# Patient Record
Sex: Male | Born: 1945 | Race: White | Hispanic: No | Marital: Married | Smoking: Former smoker
Health system: Southern US, Community
[De-identification: ages and names within clinical notes are randomized; demographics above are authoritative.]

## PROBLEM LIST (undated history)

## (undated) DIAGNOSIS — M1990 Unspecified osteoarthritis, unspecified site: Secondary | ICD-10-CM

## (undated) DIAGNOSIS — G4730 Sleep apnea, unspecified: Secondary | ICD-10-CM

## (undated) DIAGNOSIS — Z87442 Personal history of urinary calculi: Secondary | ICD-10-CM

## (undated) DIAGNOSIS — G8929 Other chronic pain: Secondary | ICD-10-CM

## (undated) DIAGNOSIS — N138 Other obstructive and reflux uropathy: Secondary | ICD-10-CM

## (undated) DIAGNOSIS — K5790 Diverticulosis of intestine, part unspecified, without perforation or abscess without bleeding: Secondary | ICD-10-CM

## (undated) DIAGNOSIS — K5792 Diverticulitis of intestine, part unspecified, without perforation or abscess without bleeding: Secondary | ICD-10-CM

## (undated) DIAGNOSIS — R9431 Abnormal electrocardiogram [ECG] [EKG]: Secondary | ICD-10-CM

## (undated) DIAGNOSIS — M109 Gout, unspecified: Secondary | ICD-10-CM

## (undated) DIAGNOSIS — I495 Sick sinus syndrome: Secondary | ICD-10-CM

## (undated) DIAGNOSIS — I10 Essential (primary) hypertension: Secondary | ICD-10-CM

## (undated) DIAGNOSIS — C801 Malignant (primary) neoplasm, unspecified: Secondary | ICD-10-CM

## (undated) DIAGNOSIS — M545 Low back pain, unspecified: Secondary | ICD-10-CM

## (undated) DIAGNOSIS — N401 Enlarged prostate with lower urinary tract symptoms: Secondary | ICD-10-CM

## (undated) DIAGNOSIS — G629 Polyneuropathy, unspecified: Secondary | ICD-10-CM

## (undated) DIAGNOSIS — R0600 Dyspnea, unspecified: Secondary | ICD-10-CM

## (undated) DIAGNOSIS — K219 Gastro-esophageal reflux disease without esophagitis: Secondary | ICD-10-CM

## (undated) DIAGNOSIS — I2510 Atherosclerotic heart disease of native coronary artery without angina pectoris: Secondary | ICD-10-CM

## (undated) DIAGNOSIS — I480 Paroxysmal atrial fibrillation: Secondary | ICD-10-CM

## (undated) DIAGNOSIS — I739 Peripheral vascular disease, unspecified: Secondary | ICD-10-CM

## (undated) DIAGNOSIS — N183 Chronic kidney disease, stage 3 unspecified: Secondary | ICD-10-CM

## (undated) DIAGNOSIS — N200 Calculus of kidney: Secondary | ICD-10-CM

## (undated) DIAGNOSIS — F410 Panic disorder [episodic paroxysmal anxiety] without agoraphobia: Secondary | ICD-10-CM

## (undated) DIAGNOSIS — R0789 Other chest pain: Secondary | ICD-10-CM

## (undated) DIAGNOSIS — I119 Hypertensive heart disease without heart failure: Secondary | ICD-10-CM

## (undated) DIAGNOSIS — K641 Second degree hemorrhoids: Secondary | ICD-10-CM

## (undated) DIAGNOSIS — Z950 Presence of cardiac pacemaker: Secondary | ICD-10-CM

## (undated) DIAGNOSIS — I4819 Other persistent atrial fibrillation: Secondary | ICD-10-CM

## (undated) DIAGNOSIS — E785 Hyperlipidemia, unspecified: Secondary | ICD-10-CM

## (undated) DIAGNOSIS — F419 Anxiety disorder, unspecified: Secondary | ICD-10-CM

## (undated) DIAGNOSIS — K648 Other hemorrhoids: Secondary | ICD-10-CM

## (undated) DIAGNOSIS — I4892 Unspecified atrial flutter: Secondary | ICD-10-CM

## (undated) DIAGNOSIS — E039 Hypothyroidism, unspecified: Secondary | ICD-10-CM

## (undated) DIAGNOSIS — I779 Disorder of arteries and arterioles, unspecified: Secondary | ICD-10-CM

## (undated) DIAGNOSIS — J181 Lobar pneumonia, unspecified organism: Secondary | ICD-10-CM

## (undated) HISTORY — DX: Paroxysmal atrial fibrillation: I48.0

## (undated) HISTORY — PX: CARDIAC CATHETERIZATION: SHX172

## (undated) HISTORY — DX: Hypothyroidism, unspecified: E03.9

## (undated) HISTORY — DX: Polyneuropathy, unspecified: G62.9

## (undated) HISTORY — PX: HEMORRHOID BANDING: SHX5850

## (undated) HISTORY — DX: Hypertensive heart disease without heart failure: I11.9

## (undated) HISTORY — DX: Disorder of arteries and arterioles, unspecified: I77.9

## (undated) HISTORY — DX: Second degree hemorrhoids: K64.1

## (undated) HISTORY — DX: Essential (primary) hypertension: I10

## (undated) HISTORY — DX: Benign prostatic hyperplasia with lower urinary tract symptoms: N40.1

## (undated) HISTORY — DX: Abnormal electrocardiogram (ECG) (EKG): R94.31

## (undated) HISTORY — PX: BACK SURGERY: SHX140

## (undated) HISTORY — DX: Lobar pneumonia, unspecified organism: J18.1

## (undated) HISTORY — DX: Atherosclerotic heart disease of native coronary artery without angina pectoris: I25.10

## (undated) HISTORY — PX: FRACTURE SURGERY: SHX138

## (undated) HISTORY — DX: Calculus of kidney: N20.0

## (undated) HISTORY — DX: Other chronic pain: G89.29

## (undated) HISTORY — DX: Other hemorrhoids: K64.8

## (undated) HISTORY — PX: INSERT / REPLACE / REMOVE PACEMAKER: SUR710

## (undated) HISTORY — DX: Low back pain: M54.5

## (undated) HISTORY — DX: Hyperlipidemia, unspecified: E78.5

## (undated) HISTORY — PX: OTHER SURGICAL HISTORY: SHX169

## (undated) HISTORY — DX: Diverticulosis of intestine, part unspecified, without perforation or abscess without bleeding: K57.90

## (undated) HISTORY — DX: Other persistent atrial fibrillation: I48.19

## (undated) HISTORY — DX: Low back pain, unspecified: M54.50

## (undated) HISTORY — DX: Other chest pain: R07.89

## (undated) HISTORY — DX: Peripheral vascular disease, unspecified: I73.9

## (undated) HISTORY — DX: Other obstructive and reflux uropathy: N13.8

---

## 1982-02-26 HISTORY — PX: CLOSED REDUCTION HAND FRACTURE: SHX973

## 1982-02-26 HISTORY — PX: LACERATION REPAIR: SHX5168

## 1996-02-27 HISTORY — PX: POSTERIOR LUMBAR FUSION: SHX6036

## 1997-05-27 ENCOUNTER — Encounter: Admission: RE | Admit: 1997-05-27 | Discharge: 1997-08-25 | Payer: Self-pay | Admitting: Neurological Surgery

## 1999-05-31 ENCOUNTER — Encounter: Payer: Self-pay | Admitting: Neurological Surgery

## 1999-05-31 ENCOUNTER — Ambulatory Visit (HOSPITAL_COMMUNITY): Admission: RE | Admit: 1999-05-31 | Discharge: 1999-05-31 | Payer: Self-pay | Admitting: Neurological Surgery

## 1999-06-12 ENCOUNTER — Ambulatory Visit (HOSPITAL_COMMUNITY): Admission: RE | Admit: 1999-06-12 | Discharge: 1999-06-12 | Payer: Self-pay | Admitting: Neurological Surgery

## 1999-06-12 ENCOUNTER — Encounter: Payer: Self-pay | Admitting: Neurological Surgery

## 1999-06-15 ENCOUNTER — Other Ambulatory Visit: Admission: RE | Admit: 1999-06-15 | Discharge: 1999-06-15 | Payer: Self-pay | Admitting: Internal Medicine

## 1999-06-15 ENCOUNTER — Encounter (INDEPENDENT_AMBULATORY_CARE_PROVIDER_SITE_OTHER): Payer: Self-pay | Admitting: Specialist

## 2000-03-05 ENCOUNTER — Encounter: Admission: RE | Admit: 2000-03-05 | Discharge: 2000-03-05 | Payer: Self-pay | Admitting: Orthopedic Surgery

## 2000-03-05 ENCOUNTER — Encounter: Payer: Self-pay | Admitting: Orthopedic Surgery

## 2001-12-20 ENCOUNTER — Emergency Department (HOSPITAL_COMMUNITY): Admission: EM | Admit: 2001-12-20 | Discharge: 2001-12-20 | Payer: Self-pay | Admitting: Emergency Medicine

## 2001-12-20 ENCOUNTER — Encounter: Payer: Self-pay | Admitting: Emergency Medicine

## 2002-01-06 ENCOUNTER — Encounter: Payer: Self-pay | Admitting: Urology

## 2002-01-06 ENCOUNTER — Encounter: Admission: RE | Admit: 2002-01-06 | Discharge: 2002-01-06 | Payer: Self-pay | Admitting: Urology

## 2002-08-12 ENCOUNTER — Ambulatory Visit (HOSPITAL_COMMUNITY): Admission: RE | Admit: 2002-08-12 | Discharge: 2002-08-12 | Payer: Self-pay | Admitting: Cardiovascular Disease

## 2003-06-28 ENCOUNTER — Ambulatory Visit (HOSPITAL_COMMUNITY): Admission: RE | Admit: 2003-06-28 | Discharge: 2003-06-28 | Payer: Self-pay | Admitting: Internal Medicine

## 2004-05-14 ENCOUNTER — Ambulatory Visit (HOSPITAL_COMMUNITY): Admission: RE | Admit: 2004-05-14 | Discharge: 2004-05-14 | Payer: Self-pay | Admitting: Neurological Surgery

## 2005-04-02 ENCOUNTER — Ambulatory Visit: Payer: Self-pay | Admitting: Internal Medicine

## 2005-04-09 ENCOUNTER — Ambulatory Visit: Payer: Self-pay | Admitting: Internal Medicine

## 2005-04-11 ENCOUNTER — Ambulatory Visit: Payer: Self-pay | Admitting: Internal Medicine

## 2005-04-24 ENCOUNTER — Ambulatory Visit: Payer: Self-pay | Admitting: Internal Medicine

## 2005-04-24 ENCOUNTER — Encounter: Payer: Self-pay | Admitting: Internal Medicine

## 2005-07-10 ENCOUNTER — Ambulatory Visit: Payer: Self-pay | Admitting: Internal Medicine

## 2005-07-17 ENCOUNTER — Ambulatory Visit: Payer: Self-pay | Admitting: Internal Medicine

## 2005-12-10 ENCOUNTER — Ambulatory Visit: Payer: Self-pay | Admitting: Internal Medicine

## 2005-12-10 LAB — CONVERTED CEMR LAB
ALT: 47 units/L — ABNORMAL HIGH (ref 0–40)
AST: 25 units/L (ref 0–37)
Albumin: 4.2 g/dL (ref 3.5–5.2)
Alkaline Phosphatase: 43 units/L (ref 39–117)
BUN: 17 mg/dL (ref 6–23)
Bilirubin, Direct: 0.2 mg/dL (ref 0.0–0.3)
CO2: 29 meq/L (ref 19–32)
Calcium: 9.3 mg/dL (ref 8.4–10.5)
Chloride: 107 meq/L (ref 96–112)
Chol/HDL Ratio, serum: 5.6
Cholesterol: 167 mg/dL (ref 0–200)
Creatinine, Ser: 1.5 mg/dL (ref 0.4–1.5)
GFR calc non Af Amer: 51 mL/min
Glomerular Filtration Rate, Af Am: 62 mL/min/{1.73_m2}
Glucose, Bld: 102 mg/dL — ABNORMAL HIGH (ref 70–99)
HDL: 29.8 mg/dL — ABNORMAL LOW (ref >39.0)
LDL DIRECT: 104.9 mg/dL
Potassium: 4.2 meq/L (ref 3.5–5.1)
Sodium: 143 meq/L (ref 135–145)
Total Bilirubin: 0.9 mg/dL (ref 0.3–1.2)
Total Protein: 6.5 g/dL (ref 6.0–8.3)
Triglyceride fasting, serum: 203 mg/dL (ref 0–149)
VLDL: 41 mg/dL — ABNORMAL HIGH (ref 0–40)

## 2005-12-17 ENCOUNTER — Ambulatory Visit: Payer: Self-pay | Admitting: Internal Medicine

## 2005-12-17 DIAGNOSIS — E785 Hyperlipidemia, unspecified: Secondary | ICD-10-CM

## 2005-12-17 DIAGNOSIS — I10 Essential (primary) hypertension: Secondary | ICD-10-CM | POA: Insufficient documentation

## 2006-03-05 ENCOUNTER — Ambulatory Visit (HOSPITAL_COMMUNITY): Admission: RE | Admit: 2006-03-05 | Discharge: 2006-03-05 | Payer: Self-pay | Admitting: Neurological Surgery

## 2006-04-17 ENCOUNTER — Encounter: Admission: RE | Admit: 2006-04-17 | Discharge: 2006-04-17 | Payer: Self-pay | Admitting: Neurological Surgery

## 2006-06-03 ENCOUNTER — Ambulatory Visit: Payer: Self-pay | Admitting: Internal Medicine

## 2006-06-03 LAB — CONVERTED CEMR LAB
ALT: 33 units/L (ref 0–40)
AST: 22 units/L (ref 0–37)
Albumin: 3.9 g/dL (ref 3.5–5.2)
Alkaline Phosphatase: 42 units/L (ref 39–117)
BUN: 18 mg/dL (ref 6–23)
Basophils Absolute: 0 10*3/uL (ref 0.0–0.1)
Basophils Relative: 0.7 % (ref 0.0–1.0)
Bilirubin, Direct: 0.1 mg/dL (ref 0.0–0.3)
CO2: 31 meq/L (ref 19–32)
Calcium: 9.5 mg/dL (ref 8.4–10.5)
Chloride: 107 meq/L (ref 96–112)
Cholesterol: 197 mg/dL (ref 0–200)
Creatinine, Ser: 1.1 mg/dL (ref 0.4–1.5)
Eosinophils Absolute: 0.2 10*3/uL (ref 0.0–0.6)
Eosinophils Relative: 4.3 % (ref 0.0–5.0)
GFR calc Af Amer: 88 mL/min
GFR calc non Af Amer: 73 mL/min
Glucose, Bld: 113 mg/dL — ABNORMAL HIGH (ref 70–99)
HCT: 45.6 % (ref 39.0–52.0)
HDL: 41.4 mg/dL (ref >39.0)
Hemoglobin: 15.5 g/dL (ref 13.0–17.0)
Hgb A1c MFr Bld: 5.1 % (ref 4.6–6.0)
LDL Cholesterol: 120 mg/dL — ABNORMAL HIGH (ref 0–99)
Lymphocytes Relative: 35.2 % (ref 12.0–46.0)
MCHC: 34 g/dL (ref 30.0–36.0)
MCV: 85.4 fL (ref 78.0–100.0)
Monocytes Absolute: 0.5 10*3/uL (ref 0.2–0.7)
Monocytes Relative: 10.2 % (ref 3.0–11.0)
Neutro Abs: 2.3 10*3/uL (ref 1.4–7.7)
Neutrophils Relative %: 49.6 % (ref 43.0–77.0)
PSA: 0.86 ng/mL (ref 0.10–4.00)
Platelets: 199 10*3/uL (ref 150–400)
Potassium: 4.5 meq/L (ref 3.5–5.1)
RBC: 5.34 M/uL (ref 4.22–5.81)
RDW: 11.8 % (ref 11.5–14.6)
Sodium: 143 meq/L (ref 135–145)
TSH: 4.44 microintl units/mL (ref 0.35–5.50)
Total Bilirubin: 0.9 mg/dL (ref 0.3–1.2)
Total CHOL/HDL Ratio: 4.8
Total Protein: 6.6 g/dL (ref 6.0–8.3)
Triglycerides: 179 mg/dL — ABNORMAL HIGH (ref 0–149)
VLDL: 36 mg/dL (ref 0–40)
WBC: 4.6 10*3/uL (ref 4.5–10.5)

## 2006-06-10 ENCOUNTER — Ambulatory Visit: Payer: Self-pay | Admitting: Internal Medicine

## 2006-12-03 ENCOUNTER — Ambulatory Visit: Payer: Self-pay | Admitting: Internal Medicine

## 2006-12-03 LAB — CONVERTED CEMR LAB
ALT: 26 units/L (ref 0–53)
AST: 23 units/L (ref 0–37)
Albumin: 4.1 g/dL (ref 3.5–5.2)
Alkaline Phosphatase: 45 units/L (ref 39–117)
BUN: 17 mg/dL (ref 6–23)
Bilirubin, Direct: 0.2 mg/dL (ref 0.0–0.3)
CO2: 33 meq/L — ABNORMAL HIGH (ref 19–32)
Calcium: 9.6 mg/dL (ref 8.4–10.5)
Chloride: 103 meq/L (ref 96–112)
Cholesterol: 197 mg/dL (ref 0–200)
Creatinine, Ser: 1.2 mg/dL (ref 0.4–1.5)
GFR calc Af Amer: 80 mL/min
GFR calc non Af Amer: 66 mL/min
Glucose, Bld: 92 mg/dL (ref 70–99)
HDL: 34.2 mg/dL — ABNORMAL LOW (ref >39.0)
LDL Cholesterol: 129 mg/dL — ABNORMAL HIGH (ref 0–99)
Potassium: 4.2 meq/L (ref 3.5–5.1)
Sodium: 142 meq/L (ref 135–145)
Total Bilirubin: 1.1 mg/dL (ref 0.3–1.2)
Total CHOL/HDL Ratio: 5.8
Total Protein: 6.6 g/dL (ref 6.0–8.3)
Triglycerides: 170 mg/dL — ABNORMAL HIGH (ref 0–149)
VLDL: 34 mg/dL (ref 0–40)

## 2006-12-10 ENCOUNTER — Ambulatory Visit: Payer: Self-pay | Admitting: Internal Medicine

## 2007-05-28 ENCOUNTER — Ambulatory Visit: Payer: Self-pay | Admitting: Internal Medicine

## 2007-05-28 LAB — CONVERTED CEMR LAB
Albumin: 3.8 g/dL (ref 3.5–5.2)
Basophils Absolute: 0 10*3/uL (ref 0.0–0.1)
CO2: 30 meq/L (ref 19–32)
Calcium: 9.3 mg/dL (ref 8.4–10.5)
Chloride: 105 meq/L (ref 96–112)
Eosinophils Absolute: 0.3 10*3/uL (ref 0.0–0.7)
HDL: 34.5 mg/dL — ABNORMAL LOW (ref >39.0)
Hemoglobin: 14.6 g/dL (ref 13.0–17.0)
LDL Cholesterol: 108 mg/dL — ABNORMAL HIGH (ref 0–99)
MCHC: 34.7 g/dL (ref 30.0–36.0)
Monocytes Absolute: 0.8 10*3/uL (ref 0.1–1.0)
Neutrophils Relative %: 56.4 % (ref 43.0–77.0)
Nitrite: NEGATIVE
PSA: 1.04 ng/mL (ref 0.10–4.00)
Sodium: 142 meq/L (ref 135–145)
Total Bilirubin: 1.1 mg/dL (ref 0.3–1.2)
Total Protein: 6.5 g/dL (ref 6.0–8.3)
Triglycerides: 168 mg/dL — ABNORMAL HIGH (ref 0–149)
Urobilinogen, UA: 0.2

## 2007-06-05 ENCOUNTER — Encounter: Payer: Self-pay | Admitting: Internal Medicine

## 2007-06-05 ENCOUNTER — Ambulatory Visit: Payer: Self-pay | Admitting: Internal Medicine

## 2007-12-03 ENCOUNTER — Ambulatory Visit: Payer: Self-pay | Admitting: Internal Medicine

## 2007-12-03 LAB — CONVERTED CEMR LAB
AST: 24 units/L (ref 0–37)
BUN: 21 mg/dL (ref 6–23)
Calcium: 9.2 mg/dL (ref 8.4–10.5)
Cholesterol: 184 mg/dL (ref 0–200)
Creatinine, Ser: 1.1 mg/dL (ref 0.4–1.5)
LDL Cholesterol: 111 mg/dL — ABNORMAL HIGH (ref 0–99)
Sodium: 148 meq/L — ABNORMAL HIGH (ref 135–145)
Total Bilirubin: 0.9 mg/dL (ref 0.3–1.2)
Total CHOL/HDL Ratio: 5
Triglycerides: 182 mg/dL — ABNORMAL HIGH (ref 0–149)

## 2007-12-22 ENCOUNTER — Ambulatory Visit: Payer: Self-pay | Admitting: Internal Medicine

## 2008-01-16 ENCOUNTER — Encounter: Payer: Self-pay | Admitting: Internal Medicine

## 2008-06-07 ENCOUNTER — Ambulatory Visit: Payer: Self-pay | Admitting: Internal Medicine

## 2008-06-07 LAB — CONVERTED CEMR LAB
ALT: 34 units/L (ref 0–53)
AST: 27 units/L (ref 0–37)
Calcium: 8.9 mg/dL (ref 8.4–10.5)
Glucose, Urine, Semiquant: NEGATIVE
HCT: 42.5 % (ref 39.0–52.0)
HDL: 35.9 mg/dL — ABNORMAL LOW (ref >39.00)
Hemoglobin: 14.8 g/dL (ref 13.0–17.0)
Lymphs Abs: 1.8 10*3/uL (ref 0.7–4.0)
MCV: 87.3 fL (ref 78.0–100.0)
Monocytes Absolute: 0.6 10*3/uL (ref 0.1–1.0)
Neutro Abs: 2.2 10*3/uL (ref 1.4–7.7)
Nitrite: NEGATIVE
PSA: 1.28 ng/mL (ref 0.10–4.00)
Platelets: 178 10*3/uL (ref 150.0–400.0)
RDW: 12.2 % (ref 11.5–14.6)
Sodium: 146 meq/L — ABNORMAL HIGH (ref 135–145)
TSH: 4.04 microintl units/mL (ref 0.35–5.50)
Total Bilirubin: 0.8 mg/dL (ref 0.3–1.2)
Triglycerides: 157 mg/dL — ABNORMAL HIGH (ref 0.0–149.0)
VLDL: 31.4 mg/dL (ref 0.0–40.0)
pH: 5.5

## 2008-06-14 ENCOUNTER — Ambulatory Visit: Payer: Self-pay | Admitting: Internal Medicine

## 2008-09-20 ENCOUNTER — Ambulatory Visit: Payer: Self-pay | Admitting: Internal Medicine

## 2008-09-20 DIAGNOSIS — E669 Obesity, unspecified: Secondary | ICD-10-CM

## 2009-06-09 ENCOUNTER — Ambulatory Visit: Payer: Self-pay | Admitting: Internal Medicine

## 2009-06-09 LAB — CONVERTED CEMR LAB
ALT: 32 units/L (ref 0–53)
Albumin: 4.3 g/dL (ref 3.5–5.2)
Basophils Absolute: 0 10*3/uL (ref 0.0–0.1)
Bilirubin, Direct: 0 mg/dL (ref 0.0–0.3)
CO2: 30 meq/L (ref 19–32)
Calcium: 9.4 mg/dL (ref 8.4–10.5)
Cholesterol: 168 mg/dL (ref 0–200)
Creatinine, Ser: 1.4 mg/dL (ref 0.4–1.5)
Direct LDL: 104.9 mg/dL
Eosinophils Relative: 3.9 % (ref 0.0–5.0)
Glucose, Bld: 90 mg/dL (ref 70–99)
HDL: 41.4 mg/dL (ref >39.00)
Hemoglobin: 15.1 g/dL (ref 13.0–17.0)
Lymphocytes Relative: 32.1 % (ref 12.0–46.0)
Monocytes Absolute: 0.4 10*3/uL (ref 0.1–1.0)
Monocytes Relative: 8.9 % (ref 3.0–12.0)
Neutro Abs: 2.7 10*3/uL (ref 1.4–7.7)
Potassium: 4.1 meq/L (ref 3.5–5.1)
RDW: 13.1 % (ref 11.5–14.6)
Total Bilirubin: 0.6 mg/dL (ref 0.3–1.2)
Total CHOL/HDL Ratio: 4
Total Protein: 7.1 g/dL (ref 6.0–8.3)
WBC Urine, dipstick: NEGATIVE

## 2009-06-15 ENCOUNTER — Ambulatory Visit: Payer: Self-pay | Admitting: Internal Medicine

## 2010-03-28 NOTE — Assessment & Plan Note (Signed)
Summary: cpx/mhf/pt rsc/cjr   Vital Signs:  Patient profile:   65 year old male Height:      69.5 inches Weight:      233 pounds BMI:     34.04 Pulse rate:   68 / minute Pulse rhythm:   regular Resp:     12 per minute BP sitting:   122 / 68  (left arm) Cuff size:   regular  Vitals Entered By: Rica Records, RN (June 15, 2009 8:03 AM)  Nutrition Counseling: Patient's BMI is greater than 25 and therefore counseled on weight management options. CC: CPX, LABS DONE Is Patient Diabetic? No   CC:  CPX and LABS DONE.  History of Present Illness: cpx  Preventive Screening-Counseling & Management  Alcohol-Tobacco     Smoking Status: quit     Year Quit: 1996  Current Problems (verified): 1)  Obesity  (ICD-278.00) 2)  Preventive Health Care  (ICD-V70.0) 3)  Hypertension  (ICD-401.9) 4)  Hyperlipidemia  (ICD-272.4)  Current Medications (verified): 1)  Hyzaar 100-25 Mg Tabs (Losartan Potassium-Hctz) .... Take 1 Tab By Mouth Daily 2)  Zocor 20 Mg Tabs (Simvastatin) .... Take 1 Tablet By Mouth Once A Day 3)  Aspirin 81 Mg  Tbec (Aspirin) .... One By Mouth Every Day 4)  Ibuprofen 200 Mg Tabs (Ibuprofen) .... 3 Three Times A Day  Allergies (verified): No Known Drug Allergies  Past History:  Past Medical History: Last updated: 12/10/2006 Blood transfusion-back surgery Hyperlipidemia Hypertension  Past Surgical History: Last updated: 10/22/2006 back surgery, 2 discs  1998 hand surgery  Family History: Last updated: 06/05/2007 mother-alive with memory-dementia-deceased age 36 yo Family History of Stroke M 1st degree relative father 4  Social History: Last updated: 06/05/2007 Retired Former Smoker Regular exercise-no Married  Risk Factors: Exercise: no (06/10/2006)  Risk Factors: Smoking Status: quit (06/15/2009)  Review of Systems       All other systems reviewed and were negative    Impression & Recommendations:  Problem # 1:  PREVENTIVE HEALTH CARE  (ICD-V70.0) health maint UTD advised daily exercise  Problem # 2:  HYPERTENSION (ICD-401.9) controlled continue current medications  His updated medication list for this problem includes:    Hyzaar 100-25 Mg Tabs (Losartan potassium-hctz) .[name]... Take 1 tab by mouth daily  BP today: 122/68 Prior BP: 142/60 (09/20/2008)  Labs Reviewed: K+: 4.1 (06/09/2009) Creat: : 1.4 (06/09/2009)   Chol: 168 (06/09/2009)   HDL: 41.40 (06/09/2009)   LDL: 115 (06/07/2008)   TG: 222.0 (06/09/2009)  Problem # 3:  HYPERLIPIDEMIA (ICD-272.4)  His updated medication list for this problem includes:    Zocor 20 Mg Tabs (Simvastatin) .[name]... Take 1 tablet by mouth once a day  Labs Reviewed: SGOT: 22 (06/09/2009)   SGPT: 32 (06/09/2009)   HDL:41.40 (06/09/2009), 35.90 (06/07/2008)  LDL:115 (06/07/2008), 111 (12/03/2007)  Chol:168 (06/09/2009), 182 (06/07/2008)  Trig:222.0 (06/09/2009), 157.0 (06/07/2008)  Complete Medication List: 1)  Hyzaar 100-25 Mg Tabs (Losartan potassium-hctz) .... Take 1 tab by mouth daily 2)  Zocor 20 Mg Tabs (Simvastatin) .... Take 1 tablet by mouth once a day 3)  Aspirin 81 Mg Tbec (Aspirin) .... One by mouth every day 4)  Ibuprofen 200 Mg Tabs (Ibuprofen) .... 3 three times a day  Prescriptions: ZOCOR 20 MG TABS (SIMVASTATIN) Take 1 tablet by mouth once a day  #90 x 3   Entered and Authorized by:   Phoebe Sharps MD   Signed by:   Phoebe Sharps MD on 06/15/2009   Method used:  Electronically to        [facility] (retail)       [place], [place]  [address]       Ph: [phone]       Fax: [phone]   RxID:   [id number] HYZAAR 100-25 MG TABS (LOSARTAN POTASSIUM-HCTZ) Take 1 tab by mouth daily  #90 x 3   Entered and Authorized by:   Phoebe Sharps MD   Signed by:   Phoebe Sharps MD on 06/15/2009   Method used:   Electronically to        [facility] (retail)       [address]       [place], [place]  [address]       Ph: [phone]       Fax: [phone]   RxID:   [id number]  Physical Exam General Appearance: well developed, well nourished, no acute distress Eyes: conjunctiva and lids normal, PERRL, EOMI,fundi WNL Ears, Nose, Mouth, Throat: TM clear, nares clear, oral exam WNL Neck: supple, no lymphadenopathy, no thyromegaly, no JVD Respiratory: clear to auscultation and percussion, respiratory effort normal Cardiovascular: regular rate and rhythm, S1-S2, no murmur, rub or gallop, no bruits, peripheral pulses normal and symmetric, no cyanosis, clubbing, edema or varicosities Chest: no scars, masses, tenderness; no asymmetry, skin changes, nipple discharge, no gynecomastia   Gastrointestinal: soft, non-tender; no hepatosplenomegaly, masses; active bowel sounds all quadrants, guaiac negative stool; no masses, tenderness, hemorrhoids  Genitourinary: no hernia, or prostate enlargement Lymphatic: no cervical, axillary or inguinal adenopathy Musculoskeletal: gait normal, muscle tone and strength WNL, no joint swelling, effusions, discoloration, crepitus  Skin: clear, good turgor, color WNL, no rashes, lesions, or ulcerations Neurologic: normal mental status, normal reflexes, normal strength, sensation, and motion Psychiatric: alert; oriented to person, place and time Other Exam:

## 2010-07-14 NOTE — Cardiovascular Report (Signed)
NAME:  Erik [name], Erik [name]                         ACCOUNT NO.:  [id number]   MEDICAL RECORD NO.:  [id number]                   PATIENT TYPE:  OIB   LOCATION:  2852                                 FACILITY:  [facility]   PHYSICIAN:  Jenkins Rouge, M.D.                  DATE OF BIRTH:  03-Mar-1945   DATE OF PROCEDURE:  DATE OF DISCHARGE:                              CARDIAC CATHETERIZATION   PROCEDURE:  Coronary angiography.   INDICATIONS:  Abnormal Cardiolite study.  The patient with multiple coronary  risk factors.   PROCEDURE:  Standard catheterization was done from the right femoral artery   ANGIOGRAPHY:  The left main coronary artery had a 20% discreet lesion.   The left anterior descending artery had 20% multiple discreet lesions in the  mid and distal portion.  First diagonal branch was normal.   Circumflex coronary artery was nondominant.  There was a 50-60% tubular  lesion in the mid vessel.  The first obtuse marginal branch was a medium to  large sized vessel with a 40% eccentric lesion in the mid vessel.   The right coronary artery was dominant.  There were 30% multiple discreet  lesions in the mid portion of the vessel.  The posterolateral branch was  somewhat small and diffusely diseased.   RAO VENTRICULOGRAM:  The RAO ventriculography was normal.  EF was in the 60%  range.  There was no gradient across the aortic valve and no MR.  Aortic  pressure was in the 168/88 range.  LV pressure was in the 168/18 range.   IMPRESSION:  Films were reviewed with Loretha Brasil. Lia Foyer, M.D. and we both  agreed with medical therapy.   The patient's chest pain is non-exertional.  It is primarily with  recumbency.  I would suggest a follow-up EGD.   The patient had his blood pressure medicine and cholesterol medicine  adjusted by Minus Breeding, M.D.  I think there is a chance of regression  and stabilization of his disease with high dose Statin drugs.   Since his Cardiolite was equivocal  in the inferior wall, it may be  worthwhile in six months after we adjust his cholesterol and blood pressure  medicine to do a stress echocardiogram for reassessment.   Clearly, if the patient's pain becomes more anginal sounding or he has a  markedly positive Cardiolite study, the circumflex coronary artery could be  dilated.   He will be discharged later today.  He will have to follow up with [facility] and get his LFTs checked in six to eight weeks.                                               Jenkins Rouge, M.D.    PN/MEDQ  D:  08/12/2002  T:  08/12/2002  Job:  [id number]  Minus Breeding, M.D.   cc:   Minus Breeding, M.D.

## 2010-07-19 ENCOUNTER — Other Ambulatory Visit: Payer: Self-pay | Admitting: Internal Medicine

## 2010-08-22 ENCOUNTER — Other Ambulatory Visit: Payer: Self-pay | Admitting: Internal Medicine

## 2010-10-23 ENCOUNTER — Other Ambulatory Visit: Payer: Self-pay | Admitting: Internal Medicine

## 2010-10-26 ENCOUNTER — Telehealth: Payer: Self-pay | Admitting: Family Medicine

## 2010-10-26 NOTE — Telephone Encounter (Signed)
Pt. wants to switch from Dr. Leanne Chang to Dr. Sarajane Jews due to schedule availability

## 2010-10-26 NOTE — Telephone Encounter (Signed)
ok 

## 2010-10-27 ENCOUNTER — Ambulatory Visit (INDEPENDENT_AMBULATORY_CARE_PROVIDER_SITE_OTHER): Payer: 59 | Admitting: Family Medicine

## 2010-10-27 ENCOUNTER — Encounter: Payer: Self-pay | Admitting: Family Medicine

## 2010-10-27 VITALS — BP 120/88 | HR 64 | Temp 98.3°F | Wt 228.0 lb

## 2010-10-27 DIAGNOSIS — E785 Hyperlipidemia, unspecified: Secondary | ICD-10-CM

## 2010-10-27 DIAGNOSIS — M109 Gout, unspecified: Secondary | ICD-10-CM

## 2010-10-27 DIAGNOSIS — I10 Essential (primary) hypertension: Secondary | ICD-10-CM

## 2010-10-27 MED ORDER — LOSARTAN POTASSIUM-HCTZ 100-25 MG PO TABS: 1.0000 | ORAL_TABLET | Freq: Every day | ORAL | Status: DC

## 2010-10-27 MED ORDER — SIMVASTATIN 20 MG PO TABS: 20.0000 mg | ORAL_TABLET | Freq: Every day | ORAL | Status: DC

## 2010-10-27 NOTE — Telephone Encounter (Signed)
Okay with me 

## 2010-10-27 NOTE — Progress Notes (Signed)
  Subjective:    Patient ID: Erik [name], male    DOB: 1945/05/29, 65 y.o.   MRN: [id number]  HPI Here for several issues. He is switching from Dr. Leanne Chang to me. He has run out of his meds and needs refills. He has not had any labs drawn since his last cpx in April 2011. His BP at home is stable. He played golf for the first time in years about 4 weeks ago, and he immediately had pain and swelling in both feet. This got better but then 2 weeks ago he woke up one morning with severe pain in both great toes, right worse then left. They were red, hot, and extremely tender. They have slowly improved since then. He takes 600 mg of Ibuprofen tid and has done so for years.    Review of Systems  Constitutional: Negative.   Respiratory: Negative.   Cardiovascular: Negative.   Musculoskeletal: Positive for back pain and joint swelling.       Objective:   Physical Exam  Constitutional: He appears well-developed and well-nourished.  Cardiovascular: Normal rate, regular rhythm, normal heart sounds and intact distal pulses.   Pulmonary/Chest: Effort normal and breath sounds normal.  Musculoskeletal:       The right great toe is tender at the IP joint. Not red or swollen           Assessment & Plan:  His HTN is stable, and I refilled the meds. He needs to set up fasting labs soon to check his lipids. He has had his first gout attack and it seems to be almost over with now. Check a uric acid level.

## 2010-11-01 ENCOUNTER — Ambulatory Visit: Payer: Self-pay | Admitting: Family Medicine

## 2010-11-02 ENCOUNTER — Other Ambulatory Visit (INDEPENDENT_AMBULATORY_CARE_PROVIDER_SITE_OTHER): Payer: 59

## 2010-11-02 DIAGNOSIS — Z0000 Encounter for general adult medical examination without abnormal findings: Secondary | ICD-10-CM

## 2010-11-02 LAB — POCT URINALYSIS DIPSTICK
Blood, UA: NEGATIVE
Glucose, UA: NEGATIVE
Ketones, UA: NEGATIVE
Nitrite, UA: NEGATIVE
pH, UA: 6

## 2010-11-02 LAB — VITAMIN B12: Vitamin B-12: 361 pg/mL (ref 211–911)

## 2010-11-02 LAB — BASIC METABOLIC PANEL
BUN: 21 mg/dL (ref 6–23)
CO2: 29 mEq/L (ref 19–32)
Calcium: 9.2 mg/dL (ref 8.4–10.5)
Creatinine, Ser: 1.2 mg/dL (ref 0.4–1.5)
GFR: 64.24 mL/min (ref >60.00)
Glucose, Bld: 99 mg/dL (ref 70–99)

## 2010-11-02 LAB — CBC WITH DIFFERENTIAL/PLATELET
Basophils Absolute: 0 10*3/uL (ref 0.0–0.1)
Eosinophils Absolute: 0.2 10*3/uL (ref 0.0–0.7)
Eosinophils Relative: 4 % (ref 0.0–5.0)
HCT: 43.1 % (ref 39.0–52.0)
Hemoglobin: 14.7 g/dL (ref 13.0–17.0)
Lymphocytes Relative: 28.5 % (ref 12.0–46.0)
MCHC: 34.1 g/dL (ref 30.0–36.0)
MCV: 87.9 fl (ref 78.0–100.0)
RBC: 4.91 Mil/uL (ref 4.22–5.81)

## 2010-11-02 LAB — HEPATIC FUNCTION PANEL
Albumin: 4.3 g/dL (ref 3.5–5.2)
Alkaline Phosphatase: 45 U/L (ref 39–117)
Total Protein: 7 g/dL (ref 6.0–8.3)

## 2010-11-02 LAB — LIPID PANEL
LDL Cholesterol: 108 mg/dL — ABNORMAL HIGH (ref 0–99)
VLDL: 32 mg/dL (ref 0.0–40.0)

## 2010-11-02 LAB — URIC ACID: Uric Acid, Serum: 6.7 mg/dL (ref 4.0–7.8)

## 2010-11-08 ENCOUNTER — Telehealth: Payer: Self-pay | Admitting: Family Medicine

## 2010-11-08 NOTE — Telephone Encounter (Signed)
Message copied by Rudi Coco on Wed Nov 08, 2010 12:28 PM ------      Message from: [name] A      Created: Tue Nov 07, 2010  5:24 AM       Normal except high TG and chol. Watch the diet

## 2010-11-08 NOTE — Telephone Encounter (Signed)
Spoke with pt and went over results.  

## 2011-11-05 ENCOUNTER — Other Ambulatory Visit: Payer: Self-pay | Admitting: Family Medicine

## 2011-11-05 NOTE — Telephone Encounter (Signed)
Can we refill these? 

## 2011-11-05 NOTE — Telephone Encounter (Signed)
Call in #30 of both with no rf. He needs an OV soon

## 2011-11-19 ENCOUNTER — Ambulatory Visit (INDEPENDENT_AMBULATORY_CARE_PROVIDER_SITE_OTHER): Payer: 59 | Admitting: Family Medicine

## 2011-11-19 ENCOUNTER — Encounter: Payer: Self-pay | Admitting: Family Medicine

## 2011-11-19 VITALS — BP 120/80 | HR 63 | Temp 97.9°F | Wt 229.0 lb

## 2011-11-19 DIAGNOSIS — I10 Essential (primary) hypertension: Secondary | ICD-10-CM

## 2011-11-19 DIAGNOSIS — Z0000 Encounter for general adult medical examination without abnormal findings: Secondary | ICD-10-CM

## 2011-11-19 DIAGNOSIS — E785 Hyperlipidemia, unspecified: Secondary | ICD-10-CM

## 2011-11-19 DIAGNOSIS — Z2911 Encounter for prophylactic immunotherapy for respiratory syncytial virus (RSV): Secondary | ICD-10-CM

## 2011-11-19 LAB — CBC WITH DIFFERENTIAL/PLATELET
Basophils Absolute: 0 K/uL (ref 0.0–0.1)
Basophils Relative: 0.5 % (ref 0.0–3.0)
Eosinophils Absolute: 0.3 K/uL (ref 0.0–0.7)
Eosinophils Relative: 4.9 % (ref 0.0–5.0)
HCT: 43.4 % (ref 39.0–52.0)
Hemoglobin: 14.9 g/dL (ref 13.0–17.0)
Lymphocytes Relative: 33.8 % (ref 12.0–46.0)
Lymphs Abs: 1.7 K/uL (ref 0.7–4.0)
MCHC: 34.2 g/dL (ref 30.0–36.0)
MCV: 86.7 fl (ref 78.0–100.0)
Monocytes Absolute: 0.6 K/uL (ref 0.1–1.0)
Monocytes Relative: 12.1 % — ABNORMAL HIGH (ref 3.0–12.0)
Neutro Abs: 2.5 K/uL (ref 1.4–7.7)
Neutrophils Relative %: 48.7 % (ref 43.0–77.0)
Platelets: 190 K/uL (ref 150.0–400.0)
RBC: 5.01 Mil/uL (ref 4.22–5.81)
RDW: 12.8 % (ref 11.5–14.6)
WBC: 5.1 K/uL (ref 4.5–10.5)

## 2011-11-19 LAB — POCT URINALYSIS DIPSTICK
Bilirubin, UA: NEGATIVE
Blood, UA: NEGATIVE
Glucose, UA: NEGATIVE
Ketones, UA: NEGATIVE
Leukocytes, UA: NEGATIVE
Nitrite, UA: NEGATIVE
Protein, UA: NEGATIVE
Spec Grav, UA: 1.025
Urobilinogen, UA: 0.2
pH, UA: 5.5

## 2011-11-19 MED ORDER — LOSARTAN POTASSIUM-HCTZ 100-25 MG PO TABS: 1.0000 | ORAL_TABLET | Freq: Every day | ORAL | Status: DC

## 2011-11-19 MED ORDER — SIMVASTATIN 20 MG PO TABS: 20.0000 mg | ORAL_TABLET | Freq: Every day | ORAL | Status: DC

## 2011-11-19 NOTE — Addendum Note (Signed)
Addended by: Aggie Hacker A on: 11/19/2011 09:58 AM   Modules accepted: Orders

## 2011-11-19 NOTE — Progress Notes (Signed)
  Subjective:    Patient ID: Erik [name], male    DOB: 1945/06/28, 66 y.o.   MRN: [id number]  HPI Here to follow up. He feels well and has no concerns. His BP is stable.    Review of Systems  Constitutional: Negative.   Respiratory: Negative.   Cardiovascular: Negative.        Objective:   Physical Exam  Constitutional: He appears well-developed and well-nourished.  Neck: No thyromegaly present.  Cardiovascular: Normal rate, regular rhythm, normal heart sounds and intact distal pulses.   Pulmonary/Chest: Effort normal and breath sounds normal.  Lymphadenopathy:    He has no cervical adenopathy.          Assessment & Plan:  Get fasting labs. I reminded him to set up a cpx soon.

## 2011-11-20 LAB — LIPID PANEL
Cholesterol: 171 mg/dL (ref 0–200)
HDL: 43.1 mg/dL (ref >39.00)
Triglycerides: 118 mg/dL (ref 0.0–149.0)

## 2011-11-20 LAB — BASIC METABOLIC PANEL
CO2: 28 mEq/L (ref 19–32)
Calcium: 9 mg/dL (ref 8.4–10.5)
Chloride: 103 mEq/L (ref 96–112)
GFR: 60 mL/min — ABNORMAL LOW (ref >60.00)
Potassium: 3.8 mEq/L (ref 3.5–5.1)

## 2011-11-20 LAB — HEPATIC FUNCTION PANEL
Albumin: 4.1 g/dL (ref 3.5–5.2)
Alkaline Phosphatase: 44 U/L (ref 39–117)
Total Bilirubin: 0.7 mg/dL (ref 0.3–1.2)

## 2011-11-20 LAB — TSH: TSH: 5.17 u[IU]/mL (ref 0.35–5.50)

## 2011-11-20 NOTE — Progress Notes (Signed)
Quick Note:  I left voice message with normal results. ______ 

## 2012-10-31 ENCOUNTER — Telehealth: Payer: Self-pay | Admitting: Family Medicine

## 2012-10-31 ENCOUNTER — Ambulatory Visit (INDEPENDENT_AMBULATORY_CARE_PROVIDER_SITE_OTHER): Payer: 59 | Admitting: Family Medicine

## 2012-10-31 ENCOUNTER — Encounter: Payer: Self-pay | Admitting: Family Medicine

## 2012-10-31 ENCOUNTER — Ambulatory Visit (INDEPENDENT_AMBULATORY_CARE_PROVIDER_SITE_OTHER)
Admission: RE | Admit: 2012-10-31 | Discharge: 2012-10-31 | Disposition: A | Discharge status: Home / Self Care | Payer: 59 | Source: Ambulatory Visit | Attending: Family Medicine | Admitting: Family Medicine

## 2012-10-31 VITALS — BP 140/80 | HR 75 | Temp 98.9°F | Wt 236.0 lb

## 2012-10-31 DIAGNOSIS — J189 Pneumonia, unspecified organism: Secondary | ICD-10-CM

## 2012-10-31 MED ORDER — LEVOFLOXACIN 500 MG PO TABS: 500.0000 mg | ORAL_TABLET | Freq: Every day | ORAL | Status: AC

## 2012-10-31 MED ORDER — CEFTRIAXONE SODIUM 1 G IJ SOLR
1.0000 g | Freq: Once | INTRAMUSCULAR | Status: AC
  Administered 2012-10-31: 1 g via INTRAMUSCULAR

## 2012-10-31 NOTE — Progress Notes (Signed)
  Subjective:    Patient ID: Erik [name], male    DOB: Feb 06, 1946, 67 y.o.   MRN: [id number]  HPI Here for 5 weeks of chest congestion, fatigue, and coughing up yellow sputum. No fever or chest pain. Mildly SOB. He has used Nyquil and other OTC meds for this.    Review of Systems  Constitutional: Negative.   HENT: Negative.   Eyes: Negative.   Respiratory: Positive for cough, chest tightness and shortness of breath. Negative for wheezing.   Cardiovascular: Negative.        Objective:   Physical Exam  Constitutional: He appears well-developed and well-nourished. No distress.  HENT:  Right Ear: External ear normal.  Left Ear: External ear normal.  Nose: Nose normal.  Mouth/Throat: Oropharynx is clear and moist.  Eyes: Conjunctivae are normal.  Neck: No thyromegaly present.  Pulmonary/Chest: Effort normal. No respiratory distress. He has no wheezes.  Rales are present at the right posterior base   Lymphadenopathy:    He has no cervical adenopathy.          Assessment & Plan:  RLL pneumonia. Given a shot of Rocephin as well as a course of Levaquin. He will get a CXR today

## 2012-10-31 NOTE — Telephone Encounter (Signed)
I left voice message with chest xray results.

## 2012-11-05 ENCOUNTER — Ambulatory Visit (INDEPENDENT_AMBULATORY_CARE_PROVIDER_SITE_OTHER): Payer: 59 | Admitting: Family Medicine

## 2012-11-05 ENCOUNTER — Encounter: Payer: Self-pay | Admitting: Family Medicine

## 2012-11-05 VITALS — BP 128/86 | HR 82 | Temp 98.5°F | Ht 71.5 in | Wt 235.0 lb

## 2012-11-05 DIAGNOSIS — J189 Pneumonia, unspecified organism: Secondary | ICD-10-CM

## 2012-11-05 HISTORY — DX: Pneumonia, unspecified organism: J18.9

## 2012-11-05 NOTE — Progress Notes (Signed)
  Subjective:    Patient ID: Erik [name], male    DOB: Oct 06, 1945, 67 y.o.   MRN: [id number]  HPI Here to follow up on RLL pneumonia. We saw him 5 days ago for a hard cough and RLL rales on exam. He was  Given a Rocephin shot and was started on Levaquin. A CXR that day di not show anything. He nw feels much better, has less coughing, and has more energy.    Review of Systems  Constitutional: Negative.   Respiratory: Positive for cough. Negative for chest tightness, shortness of breath and wheezing.   Cardiovascular: Negative for chest pain.       Objective:   Physical Exam  Constitutional: He appears well-developed and well-nourished. No distress.  Cardiovascular: Normal rate, regular rhythm, normal heart sounds and intact distal pulses.   Pulmonary/Chest: Effort normal and breath sounds normal. No respiratory distress. He has no wheezes. He has no rales.          Assessment & Plan:  Partially treated pneumonia. He will finish out the [place]. Recheck prn

## 2012-12-15 ENCOUNTER — Other Ambulatory Visit: Payer: Self-pay | Admitting: Family Medicine

## 2013-01-16 ENCOUNTER — Telehealth: Payer: Self-pay | Admitting: Family Medicine

## 2013-01-16 NOTE — Telephone Encounter (Signed)
Pt was seen on 9-11 for pneumonia. Pt is having the same symptoms and requesting same abx call into  out pt phar. Pt has appt sch for Monday. Pt was offered sat clinic.

## 2013-01-16 NOTE — Telephone Encounter (Signed)
Per Constance Holster, pt will keep appointment on 01/19/13 with Dr. Sarajane Jews.

## 2013-01-16 NOTE — Telephone Encounter (Signed)
He will need to be seen for this. Either make an appt at Sat clinic or see [place] on Monday

## 2013-01-19 ENCOUNTER — Ambulatory Visit: Payer: 59 | Admitting: Family Medicine

## 2013-01-29 ENCOUNTER — Ambulatory Visit (INDEPENDENT_AMBULATORY_CARE_PROVIDER_SITE_OTHER): Payer: 59 | Admitting: Family Medicine

## 2013-01-29 ENCOUNTER — Encounter: Payer: Self-pay | Admitting: Family Medicine

## 2013-01-29 VITALS — BP 122/74 | HR 58 | Temp 98.2°F | Wt 234.0 lb

## 2013-01-29 DIAGNOSIS — J019 Acute sinusitis, unspecified: Secondary | ICD-10-CM

## 2013-01-29 MED ORDER — AZITHROMYCIN 250 MG PO TABS: ORAL_TABLET | ORAL | Status: DC

## 2013-01-29 NOTE — Progress Notes (Signed)
   Subjective:    Patient ID: Erik [name], male    DOB: 1945/11/28, 67 y.o.   MRN: [id number]  HPI Here for 2 weeks of body aches and a dry cough. This started quite suddenly and he felt "like a truck" hit him. The fever and aches went away but now he has sinus pressure and a HA. No NVD.    Review of Systems  Constitutional: Positive for fever.  HENT: Positive for congestion, postnasal drip and sinus pressure.   Eyes: Negative.   Respiratory: Positive for cough.        Objective:   Physical Exam  Constitutional: He appears well-developed and well-nourished.  HENT:  Right Ear: External ear normal.  Left Ear: External ear normal.  Nose: Nose normal.  Mouth/Throat: Oropharynx is clear and moist.  Eyes: Conjunctivae are normal.  Pulmonary/Chest: Effort normal and breath sounds normal.  Lymphadenopathy:    He has no cervical adenopathy.          Assessment & Plan:  Sinusitis which is secondary to influenza. Given a Zpack.

## 2013-01-29 NOTE — Progress Notes (Signed)
Pre visit review using our clinic review tool, if applicable. No additional management support is needed unless otherwise documented below in the visit note. 

## 2013-03-22 ENCOUNTER — Telehealth: Payer: Self-pay | Admitting: Family Medicine

## 2013-03-22 NOTE — Telephone Encounter (Signed)
[place] requesting refill of the following:  simvastatin (ZOCOR) 20 MG tablet losartan-hydrochlorothiazide (HYZAAR) 100-25 MG per tablet

## 2013-03-23 ENCOUNTER — Other Ambulatory Visit: Payer: Self-pay | Admitting: Family Medicine

## 2013-03-23 ENCOUNTER — Telehealth: Payer: Self-pay | Admitting: Family Medicine

## 2013-03-23 DIAGNOSIS — E785 Hyperlipidemia, unspecified: Secondary | ICD-10-CM

## 2013-03-23 MED ORDER — SIMVASTATIN 20 MG PO TABS: ORAL_TABLET | ORAL | Status: DC

## 2013-03-23 MED ORDER — LOSARTAN POTASSIUM-HCTZ 100-25 MG PO TABS: ORAL_TABLET | ORAL | Status: DC

## 2013-03-23 NOTE — Telephone Encounter (Signed)
Per Dr. Sarajane Jews okay to send in a 90 day supply and pt will need fasting labs before next refills are due.

## 2013-03-23 NOTE — Telephone Encounter (Signed)
I put future lab order in computer and pt is going to schedule the lab appointment.

## 2013-03-23 NOTE — Telephone Encounter (Signed)
What labs do you want to order? I did send in scripts e-scribe.

## 2013-03-23 NOTE — Telephone Encounter (Signed)
Set up fasting lipids, hepatic, and BMET for 272.4

## 2013-06-18 ENCOUNTER — Other Ambulatory Visit (INDEPENDENT_AMBULATORY_CARE_PROVIDER_SITE_OTHER): Payer: 59

## 2013-06-18 DIAGNOSIS — E785 Hyperlipidemia, unspecified: Secondary | ICD-10-CM

## 2013-06-18 LAB — HEPATIC FUNCTION PANEL
ALT: 27 U/L (ref 0–53)
AST: 23 U/L (ref 0–37)
Albumin: 3.9 g/dL (ref 3.5–5.2)
Alkaline Phosphatase: 48 U/L (ref 39–117)
BILIRUBIN DIRECT: 0.1 mg/dL (ref 0.0–0.3)
TOTAL PROTEIN: 7 g/dL (ref 6.0–8.3)
Total Bilirubin: 0.7 mg/dL (ref 0.3–1.2)

## 2013-06-18 LAB — BASIC METABOLIC PANEL
BUN: 20 mg/dL (ref 6–23)
CALCIUM: 9.4 mg/dL (ref 8.4–10.5)
CO2: 31 mEq/L (ref 19–32)
Chloride: 104 mEq/L (ref 96–112)
Creatinine, Ser: 1.2 mg/dL (ref 0.4–1.5)
GFR: 61.94 mL/min (ref >60.00)
Glucose, Bld: 95 mg/dL (ref 70–99)
Potassium: 3.6 mEq/L (ref 3.5–5.1)
Sodium: 142 mEq/L (ref 135–145)

## 2013-06-18 LAB — LIPID PANEL
CHOLESTEROL: 143 mg/dL (ref 0–200)
HDL: 38.2 mg/dL — ABNORMAL LOW (ref >39.00)
LDL Cholesterol: 74 mg/dL (ref 0–99)
Total CHOL/HDL Ratio: 4
Triglycerides: 153 mg/dL — ABNORMAL HIGH (ref 0.0–149.0)
VLDL: 30.6 mg/dL (ref 0.0–40.0)

## 2013-06-22 ENCOUNTER — Telehealth: Payer: Self-pay | Admitting: Family Medicine

## 2013-06-22 ENCOUNTER — Other Ambulatory Visit: Payer: Self-pay | Admitting: Family Medicine

## 2013-06-22 MED ORDER — SIMVASTATIN 20 MG PO TABS: ORAL_TABLET | ORAL | Status: DC

## 2013-06-22 MED ORDER — LOSARTAN POTASSIUM-HCTZ 100-25 MG PO TABS
ORAL_TABLET | ORAL | Status: DC
Start: 2013-06-22 — End: 2014-06-18

## 2013-06-22 NOTE — Addendum Note (Signed)
Addended by: Aggie Hacker A on: 06/22/2013 05:29 PM   Modules accepted: Orders

## 2013-06-22 NOTE — Telephone Encounter (Signed)
Pt request his losartan-hydrochlorothiazide (HYZAAR) 100-25 MG per tablet simvastatin (ZOCOR) 20 MG tablet [place] out pt pharm[facility] Pt  Is out and needs asap

## 2013-06-23 NOTE — Telephone Encounter (Signed)
I sent both scripts e-scribe. 

## 2014-03-30 ENCOUNTER — Other Ambulatory Visit (HOSPITAL_COMMUNITY): Payer: Self-pay | Admitting: Dentistry

## 2014-03-31 ENCOUNTER — Other Ambulatory Visit (HOSPITAL_COMMUNITY): Payer: Self-pay | Admitting: Dentistry

## 2014-03-31 DIAGNOSIS — M2669 Other specified disorders of temporomandibular joint: Secondary | ICD-10-CM

## 2014-04-13 ENCOUNTER — Ambulatory Visit (HOSPITAL_COMMUNITY)
Admission: RE | Admit: 2014-04-13 | Discharge: 2014-04-13 | Disposition: A | Discharge status: Home / Self Care | Payer: 59 | Source: Ambulatory Visit | Attending: Dentistry | Admitting: Dentistry

## 2014-04-13 DIAGNOSIS — M2669 Other specified disorders of temporomandibular joint: Secondary | ICD-10-CM | POA: Diagnosis not present

## 2014-05-15 ENCOUNTER — Emergency Department (HOSPITAL_COMMUNITY)
Admission: EM | Admit: 2014-05-15 | Discharge: 2014-05-15 | Disposition: A | Discharge status: Home / Self Care | Payer: 59 | Attending: Emergency Medicine | Admitting: Emergency Medicine

## 2014-05-15 ENCOUNTER — Emergency Department (HOSPITAL_COMMUNITY): Payer: 59

## 2014-05-15 ENCOUNTER — Encounter (HOSPITAL_COMMUNITY): Payer: Self-pay | Admitting: *Deleted

## 2014-05-15 DIAGNOSIS — Z8739 Personal history of other diseases of the musculoskeletal system and connective tissue: Secondary | ICD-10-CM | POA: Diagnosis not present

## 2014-05-15 DIAGNOSIS — E785 Hyperlipidemia, unspecified: Secondary | ICD-10-CM | POA: Diagnosis not present

## 2014-05-15 DIAGNOSIS — G8929 Other chronic pain: Secondary | ICD-10-CM | POA: Diagnosis not present

## 2014-05-15 DIAGNOSIS — I4891 Unspecified atrial fibrillation: Secondary | ICD-10-CM | POA: Diagnosis not present

## 2014-05-15 DIAGNOSIS — Z7982 Long term (current) use of aspirin: Secondary | ICD-10-CM | POA: Diagnosis not present

## 2014-05-15 DIAGNOSIS — I10 Essential (primary) hypertension: Secondary | ICD-10-CM | POA: Diagnosis not present

## 2014-05-15 DIAGNOSIS — Z79899 Other long term (current) drug therapy: Secondary | ICD-10-CM | POA: Diagnosis not present

## 2014-05-15 DIAGNOSIS — R079 Chest pain, unspecified: Secondary | ICD-10-CM | POA: Diagnosis not present

## 2014-05-15 DIAGNOSIS — R42 Dizziness and giddiness: Secondary | ICD-10-CM | POA: Insufficient documentation

## 2014-05-15 LAB — CBC WITH DIFFERENTIAL/PLATELET
BASOS PCT: 1 % (ref 0–1)
Basophils Absolute: 0 10*3/uL (ref 0.0–0.1)
EOS ABS: 0.2 10*3/uL (ref 0.0–0.7)
EOS PCT: 3 % (ref 0–5)
HCT: 43.6 % (ref 39.0–52.0)
HEMOGLOBIN: 15.1 g/dL (ref 13.0–17.0)
Lymphocytes Relative: 24 % (ref 12–46)
Lymphs Abs: 1.7 10*3/uL (ref 0.7–4.0)
MCH: 30 pg (ref 26.0–34.0)
MCHC: 34.6 g/dL (ref 30.0–36.0)
MCV: 86.7 fL (ref 78.0–100.0)
Monocytes Absolute: 0.9 10*3/uL (ref 0.1–1.0)
Monocytes Relative: 12 % (ref 3–12)
Neutro Abs: 4.2 10*3/uL (ref 1.7–7.7)
Neutrophils Relative %: 60 % (ref 43–77)
Platelets: 191 10*3/uL (ref 150–400)
RBC: 5.03 MIL/uL (ref 4.22–5.81)
RDW: 12.9 % (ref 11.5–15.5)
WBC: 7 10*3/uL (ref 4.0–10.5)

## 2014-05-15 LAB — I-STAT TROPONIN, ED
TROPONIN I, POC: 0.01 ng/mL (ref 0.00–0.08)
Troponin i, poc: 0.01 ng/mL (ref 0.00–0.08)

## 2014-05-15 LAB — TSH: TSH: 2.434 u[IU]/mL (ref 0.350–4.500)

## 2014-05-15 LAB — PHOSPHORUS: Phosphorus: 2.1 mg/dL — ABNORMAL LOW (ref 2.3–4.6)

## 2014-05-15 LAB — MAGNESIUM: Magnesium: 1.9 mg/dL (ref 1.5–2.5)

## 2014-05-15 MED ORDER — PROCAINAMIDE HCL 100 MG/ML IJ SOLN
1000.0000 mg | Freq: Once | INTRAVENOUS | Status: AC
  Administered 2014-05-15: 1000 mg via INTRAVENOUS
  Filled 2014-05-15: qty 10

## 2014-05-15 MED ORDER — GI COCKTAIL ~~LOC~~
30.0000 mL | Freq: Once | ORAL | Status: AC
  Administered 2014-05-15: 30 mL via ORAL
  Filled 2014-05-15: qty 30

## 2014-05-15 NOTE — ED Notes (Signed)
Pt in via EMS c/o chest pain that started while on an airplane, pt was pale and diaphoretic upon EMS arrival, c/o nausea also, HR 140s-170s for EMS in Afib, no known history of same, pt given 10mg  cardizem by EMS, also 324 ASA and 4mg  zofran, HR remains in afib on arrival, rate between 100-140, pt rates chest pain 2/10 which is greatly improved, skin warm and dry, alert and oriented, 20g in right hand

## 2014-05-15 NOTE — Discharge Instructions (Signed)

## 2014-05-15 NOTE — ED Provider Notes (Signed)
CSN: [id number]     Arrival date & time 05/15/14  1938 History   First MD Initiated Contact with Patient 05/15/14 1948     Chief Complaint  Patient presents with  . Atrial Fibrillation  . Chest Pain     (Consider location/radiation/quality/duration/timing/severity/associated sxs/prior Treatment) Patient is a 69 y.o. male presenting with palpitations. The history is provided by the patient.  Palpitations Palpitations quality:  Fast Onset quality:  Sudden Duration:  3 hours Timing:  Constant Progression:  Improving Chronicity:  New Relieved by: cardizem with EMS. Worsened by:  Nothing Associated symptoms: chest pain, diaphoresis, dizziness, nausea and shortness of breath   Associated symptoms: no cough   Risk factors: no hx of atrial fibrillation, no hx of thyroid disease and no OTC sinus medications     Past Medical History  Diagnosis Date  . Hyperlipidemia   . Hypertension   . Gout   . Chronic lower back pain    Past Surgical History  Procedure Laterality Date  . Spine surgery  1998    2 lumbar discs, Dr. Ellene Route   . Hand surgery    . Colonoscopy  04-24-05    per Dr. Henrene Pastor, clear, repeat 10 yrs   Family History  Problem Relation Age of Onset  . Dementia Mother   . Stroke Father    History  Substance Use Topics  . Smoking status: Never Smoker   . Smokeless tobacco: Never Used  . Alcohol Use: 0.0 oz/week     Comment: couple times a month    Review of Systems  Constitutional: Positive for diaphoresis.  Respiratory: Positive for shortness of breath. Negative for cough.   Cardiovascular: Positive for chest pain and palpitations.  Gastrointestinal: Positive for nausea.  Neurological: Positive for dizziness.  All other systems reviewed and are negative.     Allergies  Review of patient's allergies indicates no known allergies.  Home Medications   Prior to Admission medications   Medication Sig Start Date End Date Taking? Authorizing Provider  aspirin EC  81 MG tablet Take 81 mg by mouth at bedtime.   Yes Historical Provider, MD  ibuprofen (ADVIL,MOTRIN) 200 MG tablet Take 600 mg by mouth 3 (three) times daily as needed (pain).    Yes Historical Provider, MD  losartan-hydrochlorothiazide (HYZAAR) 100-25 MG per tablet TAKE 1 TABLET BY MOUTH DAILY. Patient taking differently: Take 1 tablet by mouth daily.  06/22/13  Yes [name], MD  simvastatin (ZOCOR) 20 MG tablet TAKE 1 TABLET BY MOUTH DAILY. Patient taking differently: Take 20 mg by mouth at bedtime.  06/22/13  Yes [name], MD  azithromycin [facility]) 250 MG tablet As directed Patient not taking: Reported on 05/15/2014 01/29/13   [name], MD   BP 132/78 mmHg  Pulse 62  Temp(Src) 98.6 F (37 C) (Oral)  Resp 9  Ht 6' (1.829 m)  Wt 240 lb (108.863 kg)  BMI 32.54 kg/m2  SpO2 95% Physical Exam  Constitutional: He is oriented to person, place, and time. He appears well-developed and well-nourished. No distress.  Alert, cooperative, pleasant  HENT:  Head: Normocephalic and atraumatic.  Mouth/Throat: Oropharynx is clear and moist. No oropharyngeal exudate.  Eyes: Conjunctivae and EOM are normal. Pupils are equal, round, and reactive to light.  Neck: Normal range of motion. Neck supple.  Cardiovascular: Normal heart sounds and intact distal pulses.  An irregularly irregular rhythm present. Tachycardia present.  Exam reveals no gallop and no friction rub.   No murmur  heard. Pulmonary/Chest: Effort normal and breath sounds normal. No respiratory distress. He has no wheezes. He has no rales.  Abdominal: Soft. He exhibits no distension and no mass. There is no tenderness. There is no rebound and no guarding.  Musculoskeletal: Normal range of motion. He exhibits no edema or tenderness.  Lymphadenopathy:    He has no cervical adenopathy.  Neurological: He is alert and oriented to person, place, and time. No cranial nerve deficit.  Skin: Skin is warm and dry. No rash noted. He is not  diaphoretic.  Psychiatric: He has a normal mood and affect. His behavior is normal. Judgment and thought content normal.  Nursing note and vitals reviewed.   ED Course  Procedures (including critical care time) Labs Review Labs Reviewed  PHOSPHORUS - Abnormal; Notable for the following:    Phosphorus 2.1 (*)    All other components within normal limits  MAGNESIUM  CBC WITH DIFFERENTIAL/PLATELET  TSH  I-STAT TROPOININ, ED  Randolm Idol, ED  Randolm Idol, ED    Imaging Review Dg Chest Port 1 View  05/15/2014   CLINICAL DATA:  Chest pain, atrial fibrillation  EXAM: PORTABLE CHEST - 1 VIEW  COMPARISON:  10/31/2012  FINDINGS: Lungs are clear.  No pleural effusion or pneumothorax.  The heart is normal in size.  IMPRESSION: No evidence of acute cardiopulmonary disease.   Electronically Signed   By: Julian Hy M.D.   On: 05/15/2014 20:40     EKG Interpretation   Date/Time:  Saturday May 15 2014 21:37:08 EDT Ventricular Rate:  62 PR Interval:  155 QRS Duration: 93 QT Interval:  448 QTC Calculation: 455 R Axis:   33 Text Interpretation:  Sinus rhythm Probable left atrial enlargement  Confirmed by RAY MD, Andee Poles [phone]) on 05/15/2014 9:42:09 PM      MDM   Final diagnoses:  Atrial fibrillation   69 year old male with a past medical history of hypertension presents with new onset atrial fibrillation. Symptoms started abruptly at about 4 PM on his flight home. On EMS arrival at the airport his heart rate was 170 and in atrial fibrillation with RVR. He improved to a heart rate around 120 with 10 mg of diltiazem IV. We will give the patient a fluid bolus as well as checked electrolytes, TSH, troponin. He has no cardiac history and his only history is hypertension. Given recent onset of atrial fibrillation without other obvious source or risk for stroke, we will attempt pharmacologic cardioversion with procainamide. Chest x-ray will also be ordered for evaluation. The  patient will need cardiology follow-up after he is cardioverted.  9:45 PM converted to normal sinus rhythm following procainamide. Chest discomfort remains. On further history, he drank a large amount this weekend and [facility] which is unusual for him. Consider holiday heart syndrome. He believes the chest discomfort is reflux. We will try a GI cocktail and repeat a delta troponin at 3 hours.  11:15 PM delta troponin negative. Remains in normal sinus rhythm. Chest pain free after GI cocktail. We'll discharge with cardiology follow-up and emergency Department return precautions for repeat chest pain or palpitations. Cha2ds2vasc score of 1. Already on asprin. Will not change that at this time.  Larence Penning, MD 05/15/14 [id number]  Pattricia Boss, MD 05/16/14 [id number]

## 2014-05-19 ENCOUNTER — Encounter: Payer: Self-pay | Admitting: Cardiovascular Disease

## 2014-05-19 ENCOUNTER — Ambulatory Visit (INDEPENDENT_AMBULATORY_CARE_PROVIDER_SITE_OTHER): Payer: 59 | Admitting: Cardiovascular Disease

## 2014-05-19 VITALS — BP 108/77 | HR 112 | Ht 71.0 in | Wt 240.0 lb

## 2014-05-19 DIAGNOSIS — I10 Essential (primary) hypertension: Secondary | ICD-10-CM | POA: Diagnosis not present

## 2014-05-19 DIAGNOSIS — Z79899 Other long term (current) drug therapy: Secondary | ICD-10-CM | POA: Diagnosis not present

## 2014-05-19 DIAGNOSIS — I480 Paroxysmal atrial fibrillation: Secondary | ICD-10-CM | POA: Diagnosis not present

## 2014-05-19 DIAGNOSIS — I4891 Unspecified atrial fibrillation: Secondary | ICD-10-CM

## 2014-05-19 LAB — BASIC METABOLIC PANEL
BUN: 24 mg/dL — ABNORMAL HIGH (ref 6–23)
CHLORIDE: 103 meq/L (ref 96–112)
CO2: 28 meq/L (ref 19–32)
Calcium: 9.9 mg/dL (ref 8.4–10.5)
Creat: 1.2 mg/dL (ref 0.50–1.35)
Glucose, Bld: 84 mg/dL (ref 70–99)
POTASSIUM: 4.1 meq/L (ref 3.5–5.3)
Sodium: 141 mEq/L (ref 135–145)

## 2014-05-19 MED ORDER — METOPROLOL TARTRATE 25 MG PO TABS: 25.0000 mg | ORAL_TABLET | Freq: Two times a day (BID) | ORAL | Status: DC

## 2014-05-19 MED ORDER — APIXABAN 5 MG PO TABS: 5.0000 mg | ORAL_TABLET | Freq: Two times a day (BID) | ORAL | Status: DC

## 2014-05-19 NOTE — Assessment & Plan Note (Signed)
History of hypertension blood pressure 108/77 today on losartan/hydrochlorothiazide. Continue current meds at current dosing

## 2014-05-19 NOTE — Assessment & Plan Note (Signed)
History of hyperlipidemia on simvastatin 20 mg a day with recent lipid profile performed performed one year ago revealing a total cholesterol 143, LDL of 74 and HDL 38, followed by his PCP

## 2014-05-19 NOTE — Progress Notes (Signed)
05/19/2014 EZZARD CARESS   01/26/1946  [id number]  Primary Physician [name], MD Primary Cardiologist: Lorretta Harp MD Renae Gloss   HPI:  Mr. Ebron is a 69 year old mild to moderately overweight married Caucasian male father of one child who is retired from prior. Accompanied by his wife [name] who works as a Sales promotion account executive. His chronic risk factor profile notable for treated hypertension and hyperlipidemia.Mr. Kriete presents today as a new patient for evaluation of PAF. He apparently had a negative cath 10 years ago by Dr. Johnsie Cancel in for chest pain. He has not felt well for the last month complaining of increasing dyspnea on exertion. He recently took a trip to [facility] and drank 30 shots within 16 hours. On the way back in the airplane he developed chest pain and was seen when he landed at [facility] emergency room where he was found to be in A. Fib with RVR with a heart rate of 170. His heart rate was slowed with IV diltiazem and then he was given procainamide and converted to sinus rhythm. His troponins were negative. He felt clinically improved. A TSH was normal. Today the office he is in A. Fib with a heart rate of 112. I'm going to add low-dose beta blocker (Lopressor 25 mg by mouth twice a day), Eliquis obtain a 2-D echo and from left Myoview stress test. We will also check a basic metabolic panel. He'll be seen back in 2 weeks in follow-up to assess clinical improvement. Ultimately he will require outpatient DC cardioversion   Current Outpatient Prescriptions  Medication Sig Dispense Refill  . aspirin EC 81 MG tablet Take 81 mg by mouth at bedtime.    [name] ibuprofen (ADVIL,MOTRIN) 200 MG tablet Take 600 mg by mouth 3 (three) times daily as needed (pain).     [name] losartan-hydrochlorothiazide (HYZAAR) 100-25 MG per tablet TAKE 1 TABLET BY MOUTH DAILY. (Patient taking differently: Take 1 tablet by mouth daily. ) 90 tablet 3  . simvastatin (ZOCOR) 20 MG  tablet TAKE 1 TABLET BY MOUTH DAILY. (Patient taking differently: Take 20 mg by mouth at bedtime. ) 90 tablet 3  . apixaban (ELIQUIS) 5 MG TABS tablet Take 1 tablet (5 mg total) by mouth 2 (two) times daily. 60 tablet 3  . metoprolol tartrate (LOPRESSOR) 25 MG tablet Take 1 tablet (25 mg total) by mouth 2 (two) times daily. 180 tablet 3   No current facility-administered medications for this visit.    No Known Allergies  History   Social History  . Marital Status: Married    Spouse Name: N/A  . Number of Children: N/A  . Years of Education: N/A   Occupational History  . Not on file.   Social History Main Topics  . Smoking status: Former Smoker    Quit date: 02/27/1995  . Smokeless tobacco: Never Used  . Alcohol Use: 0.0 oz/week    0 Standard drinks or equivalent per week     Comment: couple times a month  . Drug Use: No  . Sexual Activity: Not on file   Other Topics Concern  . Not on file   Social History Narrative     Review of Systems: General: negative for chills, fever, night sweats or weight changes.  Cardiovascular: negative for chest pain, dyspnea on exertion, edema, orthopnea, palpitations, paroxysmal nocturnal dyspnea or shortness of breath Dermatological: negative for rash Respiratory: negative for cough or wheezing Urologic: negative for hematuria Abdominal: negative for nausea,  vomiting, diarrhea, bright red blood per rectum, melena, or hematemesis Neurologic: negative for visual changes, syncope, or dizziness All other systems reviewed and are otherwise negative except as noted above.    Blood pressure 108/77, pulse 112, height 5\' 11"  (1.803 m), weight 240 lb (108.863 kg).  General appearance: alert and no distress Neck: no adenopathy, no carotid bruit, no JVD, supple, symmetrical, trachea midline and thyroid not enlarged, symmetric, no tenderness/mass/nodules Lungs: clear to auscultation bilaterally Heart: irregularly irregular rhythm Extremities:  extremities normal, atraumatic, no cyanosis or edema  EKG atrial fibrillation with a ventricular response of 112. Post reviewed this EKG  ASSESSMENT AND PLAN:   Paroxysmal atrial fibrillation Mr. Winborn presents today as a new patient for evaluation of PAF. He apparently had a negative cath 10 years ago by Dr. Johnsie Cancel in for chest pain. He has not felt well for the last month complaining of increasing dyspnea on exertion. He recently took a trip to [facility] and drank 30 shots within 16 hours. On the way back in the airplane he developed chest pain and was seen when he landed at [facility] emergency room where he was found to be in A. Fib with RVR with a heart rate of 170. His heart rate was slowed with IV diltiazem and then he was given procainamide and converted to sinus rhythm. His troponins were negative. He felt clinically improved. A TSH was normal. Today the office he is in A. Fib with a heart rate of 112. I'm going to add low-dose beta blocker (Lopressor 25 mg by mouth twice a day), Eliquis obtain a 2-D echo and from left Myoview stress test. We will also check a basic metabolic panel. He'll be seen back in 2 weeks in follow-up to assess clinical improvement. Ultimately he will require outpatient DC cardioversion.   Essential hypertension History of hypertension blood pressure 108/77 today on losartan/hydrochlorothiazide. Continue current meds at current dosing   HYPERLIPIDEMIA History of hyperlipidemia on simvastatin 20 mg a day with recent lipid profile performed performed one year ago revealing a total cholesterol 143, LDL of 74 and HDL 38, followed by his PCP       Lorretta Harp MD [facility], [facility] 05/19/2014 12:01 PM

## 2014-05-19 NOTE — Patient Instructions (Addendum)
  We will see you back in follow up in 2 weeks with an extender (on a day that Dr [name] is in the office)  Dr [name] has ordered: 1.  Echocardiogram. Echocardiography is a painless test that uses sound waves to create images of your heart. It provides your doctor with information about the size and shape of your heart and how well your heart's chambers and valves are working. This procedure takes approximately one hour. There are no restrictions for this procedure.   2. Lexiscan Myoview- this is a test that looks at the blood flow to your heart muscle.  It takes approximately 2 1/2 hours. Please follow instruction sheet, as given.   3. Start Lopressor 25mg  twice a day  4. Start Eliquis 5mg  twice a day  5. Have blood work done today on your way out.

## 2014-05-19 NOTE — Assessment & Plan Note (Signed)
[name] presents today as a new patient for evaluation of PAF. He apparently had a negative cath 10 years ago by Dr. Johnsie Cancel in for chest pain. He has not felt well for the last month complaining of increasing dyspnea on exertion. He recently took a trip to [facility] and drank 30 shots within 16 hours. On the way back in the airplane he developed chest pain and was seen when he landed at [facility] emergency room where he was found to be in A. Fib with RVR with a heart rate of 170. His heart rate was slowed with IV diltiazem and then he was given procainamide and converted to sinus rhythm. His troponins were negative. He felt clinically improved. A TSH was normal. Today the office he is in A. Fib with a heart rate of 112. I'm going to add low-dose beta blocker (Lopressor 25 mg by mouth twice a day), Eliquis obtain a 2-D echo and from left Myoview stress test. We will also check a basic metabolic panel. He'll be seen back in 2 weeks in follow-up to assess clinical improvement. Ultimately he will require outpatient DC cardioversion.

## 2014-05-24 ENCOUNTER — Encounter: Payer: Self-pay | Admitting: *Deleted

## 2014-06-03 ENCOUNTER — Ambulatory Visit (HOSPITAL_BASED_OUTPATIENT_CLINIC_OR_DEPARTMENT_OTHER)
Admission: RE | Admit: 2014-06-03 | Discharge: 2014-06-03 | Disposition: A | Discharge status: Home / Self Care | Payer: 59 | Source: Ambulatory Visit | Attending: Cardiovascular Disease | Admitting: Cardiovascular Disease

## 2014-06-03 ENCOUNTER — Ambulatory Visit (HOSPITAL_COMMUNITY)
Admission: RE | Admit: 2014-06-03 | Discharge: 2014-06-03 | Disposition: A | Discharge status: Home / Self Care | Payer: 59 | Source: Ambulatory Visit | Attending: Cardiology | Admitting: Cardiology

## 2014-06-03 DIAGNOSIS — I4891 Unspecified atrial fibrillation: Secondary | ICD-10-CM

## 2014-06-03 MED ORDER — TECHNETIUM TC 99M SESTAMIBI GENERIC - CARDIOLITE
31.2000 | Freq: Once | INTRAVENOUS | Status: AC | PRN
  Administered 2014-06-03: 31.2 via INTRAVENOUS

## 2014-06-03 MED ORDER — REGADENOSON 0.4 MG/5ML IV SOLN
0.4000 mg | Freq: Once | INTRAVENOUS | Status: AC
  Administered 2014-06-03: 0.4 mg via INTRAVENOUS

## 2014-06-03 MED ORDER — AMINOPHYLLINE 25 MG/ML IV SOLN
125.0000 mg | Freq: Once | INTRAVENOUS | Status: AC
  Administered 2014-06-03: 125 mg via INTRAVENOUS

## 2014-06-03 MED ORDER — TECHNETIUM TC 99M SESTAMIBI GENERIC - CARDIOLITE
10.8000 | Freq: Once | INTRAVENOUS | Status: AC | PRN
  Administered 2014-06-03: 11 via INTRAVENOUS

## 2014-06-03 NOTE — Progress Notes (Signed)
2D Echo Performed 06/03/2014    Erik [name], RCS

## 2014-06-03 NOTE — Procedures (Addendum)
[facility] [address] [place] [address] [id number]  Cardiology Nuclear Med Study  Erik [name] is a 69 y.o. male     MRN : [id number]     DOB: [birth year]  Procedure Date: 06/03/2014  Nuclear Med Background Indication for Stress Test:  Evaluation for Ischemia and [facility] History:  AFIB w/RVR;No further respiratory or cardiac history reported;No prior NUC MPI for comparison. Cardiac Risk Factors: History of Smoking, Hypertension, Lipids and Overweight  Symptoms:  Chest Pain, Dizziness, DOE, Fatigue, Light-Headedness, Palpitations and SOB   Nuclear Pre-Procedure Caffeine/Decaff Intake:  9:00pm NPO After: 5:00am   IV Site: R Forearm  IV 0.9% NS with Angio Cath:  22g  Chest Size (in):  46" IV Started by: Rolene Course, RN  Height: 5\' 11"  (1.803 m)  Cup Size: n/a  BMI:  Body mass index is 33.49 kg/(m^2). Weight:  240 lb (108.863 kg)   Tech Comments:  n/a    Nuclear Med Study 1 or 2 day study: 1 day  Stress Test Type:  [place] Provider:  Quay Burow, MD   Resting Radionuclide: Technetium 1m Sestamibi  Resting Radionuclide Dose: 10.8 mCi   Stress Radionuclide:  Technetium 73m Sestamibi  Stress Radionuclide Dose: 31.2 mCi           Stress Protocol Rest HR: 57 Stress HR: 66  Rest BP: 138/73 Stress BP: 129/69  Exercise Time (min): n/a METS: n/a          Dose of Adenosine (mg):  n/a Dose of Lexiscan: 0.4 mg  Dose of Atropine (mg): n/a Dose of Dobutamine: n/a mcg/kg/min (at max HR)  Stress Test Technologist: Leane Para, CCT Nuclear Technologist: Anthony Sar   Rest Procedure:  Myocardial perfusion imaging was performed at rest 45 minutes following the intravenous administration of Technetium 87m Sestamibi. Stress Procedure:  The patient received IV Lexiscan 0.4 mg over 15-seconds.  Technetium 107m Sestamibi injected IV at 30-seconds.  The patient experienced SOB, then  became Hypotensive and 125 mg Aminophylline IV was administered.  There were no significant changes with Lexiscan.  Quantitative spect images were obtained after a 45 minute delay.  Transient Ischemic Dilatation (Normal <1.22):  1.15  QGS EDV:  90 ml QGS ESV:  33 ml LV Ejection Fraction: 64%  PHYSICIAN INTERPRETATION   Rest ECG: NSR with non-specific ST-T wave changes  Stress ECG: No significant ST segment change suggestive of ischemia.  QPS Raw Data Images:  Mild diaphragmatic attenuation.  Normal left ventricular size. Stress Images:  There is decreased uptake in the inferior wall. Small sized, mild intensity reversible defect noted in the apical inferior/inferseptal wall  Rest Images:  Normal homogeneous uptake in all areas of the myocardium. Subtraction (SDS):  There is a small sized, mild intensity reversible defect in the apical inferior-inferoseptal wall - consider mild ischemia, cannot exclude artifact with mild diaphragmatic attenuation  Impression Exercise Capacity:  Lexiscan with no exercise. BP Response:  Hypotensive blood pressure response. Clinical Symptoms:  There is dyspnea. ECG Impression:  No significant ECG changes with Lexiscan. Comparison with Prior Nuclear Study: No images to compare  Overall Impression:  Low risk stress nuclear study with a small sized, mild intensity reversible defect concering for mild apical-inferior/inferoseptal ischeima.  Clinical Correlation is recommended..  LV Wall Motion:  NL LV Function; NL Wall Motion   [name], MD  06/04/2014 3:43 PM

## 2014-06-09 ENCOUNTER — Encounter: Payer: Self-pay | Admitting: Cardiovascular Disease

## 2014-06-09 ENCOUNTER — Ambulatory Visit (INDEPENDENT_AMBULATORY_CARE_PROVIDER_SITE_OTHER): Payer: 59 | Admitting: Cardiovascular Disease

## 2014-06-09 VITALS — BP 126/84 | HR 60 | Ht 71.0 in | Wt 243.7 lb

## 2014-06-09 DIAGNOSIS — I480 Paroxysmal atrial fibrillation: Secondary | ICD-10-CM

## 2014-06-09 NOTE — Patient Instructions (Signed)
Your physician wants you to follow-up in: 1 year with Dr Berry. You will receive a reminder letter in the mail two months in advance. If you don't receive a letter, please call our office to schedule the follow-up appointment.  

## 2014-06-09 NOTE — Progress Notes (Signed)
Erik [name] returns today for follow-up of his outpatient tests. His 2-D echo and Myoview stress test were normal. He converted spontaneously to normal sinus rhythm which he was in the time of his Myoview and he is in today. I've elected to maintain him on oral anticoagulation given his CHA2DSVASC2 score of 2. I will see back 1 year follow-up  .Lorretta Harp, M.D., [place], [facility], [place] [address] [name],   [address]  [phone] 06/09/2014 3:23 PM

## 2014-06-09 NOTE — Assessment & Plan Note (Signed)
Erik [name] returns today and appears to be in sinus rhythm. A Myoview stress test and 2-D echo are normal. I've decided to maintain him on Eliquis oral anticoagulation because his CHA2DSVASC2 score is 2 and he is unaware when or if he is in A. Fib.

## 2014-06-18 ENCOUNTER — Other Ambulatory Visit: Payer: Self-pay | Admitting: Family Medicine

## 2014-06-18 NOTE — Telephone Encounter (Signed)
I spoke with pt and he is going to schedule a CPE. I did send in a 90 day supply due to pt's insurance.

## 2014-07-15 ENCOUNTER — Telehealth: Payer: Self-pay | Admitting: Cardiovascular Disease

## 2014-07-15 MED ORDER — METOPROLOL TARTRATE 25 MG PO TABS: 25.0000 mg | ORAL_TABLET | Freq: Two times a day (BID) | ORAL | Status: DC

## 2014-07-15 MED ORDER — APIXABAN 5 MG PO TABS: 5.0000 mg | ORAL_TABLET | Freq: Two times a day (BID) | ORAL | Status: DC

## 2014-07-15 NOTE — Telephone Encounter (Signed)
Patient made aware refill has been sent to the pharmacy electronically

## 2014-07-15 NOTE — Telephone Encounter (Signed)
°  1. Which medications need to be refilled? Metoprolol and Eliquis  2. Which pharmacy is medication to be sent to?[facility] -[phone] 3. Do they need a 30 day or 90 day supply? 90 and refills  4. Would they like a call back once the medication has been sent to the pharmacy? yes

## 2014-08-25 ENCOUNTER — Encounter: Payer: Self-pay | Admitting: Family Medicine

## 2014-08-25 ENCOUNTER — Encounter: Payer: Self-pay | Admitting: Internal Medicine

## 2014-08-25 ENCOUNTER — Ambulatory Visit (INDEPENDENT_AMBULATORY_CARE_PROVIDER_SITE_OTHER): Payer: Medicare Other | Admitting: Family Medicine

## 2014-08-25 VITALS — BP 128/76 | HR 78 | Temp 98.0°F | Ht 71.0 in | Wt 244.0 lb

## 2014-08-25 DIAGNOSIS — I10 Essential (primary) hypertension: Secondary | ICD-10-CM | POA: Diagnosis not present

## 2014-08-25 DIAGNOSIS — I480 Paroxysmal atrial fibrillation: Secondary | ICD-10-CM | POA: Diagnosis not present

## 2014-08-25 DIAGNOSIS — E785 Hyperlipidemia, unspecified: Secondary | ICD-10-CM | POA: Diagnosis not present

## 2014-08-25 DIAGNOSIS — K625 Hemorrhage of anus and rectum: Secondary | ICD-10-CM

## 2014-08-25 DIAGNOSIS — N401 Enlarged prostate with lower urinary tract symptoms: Secondary | ICD-10-CM | POA: Diagnosis not present

## 2014-08-25 DIAGNOSIS — N138 Other obstructive and reflux uropathy: Secondary | ICD-10-CM

## 2014-08-25 LAB — HEPATIC FUNCTION PANEL
ALT: 24 U/L (ref 0–53)
AST: 19 U/L (ref 0–37)
Albumin: 4.2 g/dL (ref 3.5–5.2)
Alkaline Phosphatase: 46 U/L (ref 39–117)
BILIRUBIN DIRECT: 0.1 mg/dL (ref 0.0–0.3)
Total Bilirubin: 0.7 mg/dL (ref 0.2–1.2)
Total Protein: 6.8 g/dL (ref 6.0–8.3)

## 2014-08-25 LAB — POCT URINALYSIS DIPSTICK
Bilirubin, UA: NEGATIVE
Glucose, UA: NEGATIVE
Ketones, UA: NEGATIVE
Leukocytes, UA: NEGATIVE
Nitrite, UA: NEGATIVE
RBC UA: NEGATIVE
SPEC GRAV UA: 1.025
UROBILINOGEN UA: 0.2
pH, UA: 5.5

## 2014-08-25 LAB — CBC WITH DIFFERENTIAL/PLATELET
BASOS PCT: 0.4 % (ref 0.0–3.0)
Basophils Absolute: 0 10*3/uL (ref 0.0–0.1)
EOS PCT: 5.9 % — AB (ref 0.0–5.0)
Eosinophils Absolute: 0.3 10*3/uL (ref 0.0–0.7)
HEMATOCRIT: 46.6 % (ref 39.0–52.0)
Hemoglobin: 16 g/dL (ref 13.0–17.0)
LYMPHS ABS: 1.5 10*3/uL (ref 0.7–4.0)
Lymphocytes Relative: 25.9 % (ref 12.0–46.0)
MCHC: 34.4 g/dL (ref 30.0–36.0)
MCV: 86.6 fl (ref 78.0–100.0)
MONO ABS: 0.6 10*3/uL (ref 0.1–1.0)
MONOS PCT: 10 % (ref 3.0–12.0)
NEUTROS PCT: 57.8 % (ref 43.0–77.0)
Neutro Abs: 3.4 10*3/uL (ref 1.4–7.7)
PLATELETS: 225 10*3/uL (ref 150.0–400.0)
RBC: 5.38 Mil/uL (ref 4.22–5.81)
RDW: 13.6 % (ref 11.5–15.5)
WBC: 5.9 10*3/uL (ref 4.0–10.5)

## 2014-08-25 LAB — BASIC METABOLIC PANEL
BUN: 22 mg/dL (ref 6–23)
CO2: 28 meq/L (ref 19–32)
Calcium: 9.7 mg/dL (ref 8.4–10.5)
Chloride: 104 mEq/L (ref 96–112)
Creatinine, Ser: 1.39 mg/dL (ref 0.40–1.50)
GFR: 54.09 mL/min — ABNORMAL LOW (ref >60.00)
Glucose, Bld: 101 mg/dL — ABNORMAL HIGH (ref 70–99)
POTASSIUM: 3.5 meq/L (ref 3.5–5.1)
Sodium: 142 mEq/L (ref 135–145)

## 2014-08-25 LAB — TSH: TSH: 5.96 u[IU]/mL — AB (ref 0.35–4.50)

## 2014-08-25 LAB — LIPID PANEL
CHOL/HDL RATIO: 4
Cholesterol: 146 mg/dL (ref 0–200)
HDL: 35.6 mg/dL — AB (ref >39.00)
LDL CALC: 79 mg/dL (ref 0–99)
NONHDL: 110.4
Triglycerides: 157 mg/dL — ABNORMAL HIGH (ref 0.0–149.0)
VLDL: 31.4 mg/dL (ref 0.0–40.0)

## 2014-08-25 LAB — PSA: PSA: 0.82 ng/mL (ref 0.10–4.00)

## 2014-08-25 NOTE — Progress Notes (Signed)
Pre visit review using our clinic review tool, if applicable. No additional management support is needed unless otherwise documented below in the visit note. 

## 2014-08-25 NOTE — Progress Notes (Signed)
   Subjective:    Patient ID: Erik [name], male    DOB: 1946-01-22, 69 y.o.   MRN: [id number]  HPI Here to follow up. He was diagnosed wit paroxysmal atrial fibrillation in March when he developed tachycardia wit chest pain on an airplane flight home from [place]. He has been seeing Dr. [name] and has spontaneously converted to sinus rhythm. He has had 2 episodes of waking up during the night wit his heart racing, but each time this had resolved by the time he got up the next morning. No chest pains or SOB. During his workup in march and April he had a normal myocardial perfusion test and a normal ECHO. Also he mentions some recent rectal bleeding. Both with BMs and also in between BMs. No abdominal or rectal pain. His colonoscopy in 2007 showed internal hemorrhoids and diverticulae but no polyps.    Review of Systems  Constitutional: Negative.   Respiratory: Negative.   Cardiovascular: Negative.   Gastrointestinal: Positive for anal bleeding. Negative for nausea, vomiting, abdominal pain, diarrhea, constipation, blood in stool, abdominal distention and rectal pain.  Neurological: Negative.        Objective:   Physical Exam  Constitutional: He is oriented to person, place, and time. He appears well-developed and well-nourished.  Neck: No thyromegaly present.  Cardiovascular: Normal rate, regular rhythm, normal heart sounds and intact distal pulses.   Pulmonary/Chest: Effort normal and breath sounds normal.  Abdominal: Soft. Bowel sounds are normal. He exhibits no distension and no mass. There is no tenderness. There is no rebound and no guarding.  Lymphadenopathy:    He has no cervical adenopathy.  Neurological: He is alert and oriented to person, place, and time.          Assessment & Plan:  His atrial fibrillation appears to be under control. His HTN is stable. Get fasting labs today. He will schedule a cpx with [place] soon. Refer to GI for the rectal bleeding.

## 2014-09-07 MED ORDER — LEVOTHYROXINE SODIUM 75 MCG PO TABS: 75.0000 ug | ORAL_TABLET | Freq: Every day | ORAL | Status: DC

## 2014-09-07 NOTE — Addendum Note (Signed)
Addended by: Aggie Hacker A on: 09/07/2014 04:45 PM   Modules accepted: Orders

## 2014-09-21 ENCOUNTER — Encounter: Payer: Self-pay | Admitting: Family Medicine

## 2014-09-21 ENCOUNTER — Ambulatory Visit (INDEPENDENT_AMBULATORY_CARE_PROVIDER_SITE_OTHER): Payer: Medicare Other | Admitting: Family Medicine

## 2014-09-21 VITALS — BP 137/71 | HR 57 | Temp 98.5°F | Ht 71.0 in | Wt 244.0 lb

## 2014-09-21 DIAGNOSIS — Z0000 Encounter for general adult medical examination without abnormal findings: Secondary | ICD-10-CM | POA: Diagnosis not present

## 2014-09-21 DIAGNOSIS — Z823 Family history of stroke: Secondary | ICD-10-CM

## 2014-09-21 MED ORDER — LOSARTAN POTASSIUM-HCTZ 100-25 MG PO TABS: 1.0000 | ORAL_TABLET | Freq: Every day | ORAL | Status: DC

## 2014-09-21 MED ORDER — SIMVASTATIN 20 MG PO TABS: 20.0000 mg | ORAL_TABLET | Freq: Every day | ORAL | Status: DC

## 2014-09-21 NOTE — Progress Notes (Signed)
   Subjective:    Patient ID: Erik [name], male    DOB: 08/02/1945, 69 y.o.   MRN: [id number]  HPI 69 yr old male for a cpx. He feels well in general. As we noted at our last visit he has been seeing Dr. [name] about his paroxysmal atrial fibrillation. His BP and pulse rate have been stable. He notes a strong family hx of strokes and it appears that his carotids have never been evaluated. He was found to be hypothyroid so we started him on Synthroid.    Review of Systems  Constitutional: Negative.   HENT: Negative.   Eyes: Negative.   Respiratory: Negative.   Cardiovascular: Negative.   Gastrointestinal: Negative.   Genitourinary: Negative.   Musculoskeletal: Negative.   Skin: Negative.   Neurological: Negative.   Psychiatric/Behavioral: Negative.        Objective:   Physical Exam  Constitutional: He is oriented to person, place, and time. He appears well-developed and well-nourished. No distress.  HENT:  Head: Normocephalic and atraumatic.  Right Ear: External ear normal.  Left Ear: External ear normal.  Nose: Nose normal.  Mouth/Throat: Oropharynx is clear and moist. No oropharyngeal exudate.  Eyes: Conjunctivae and EOM are normal. Pupils are equal, round, and reactive to light. Right eye exhibits no discharge. Left eye exhibits no discharge. No scleral icterus.  Neck: Neck supple. No JVD present. No tracheal deviation present. No thyromegaly present.  Cardiovascular: Normal rate, regular rhythm, normal heart sounds and intact distal pulses.  Exam reveals no gallop and no friction rub.   No murmur heard. Pulmonary/Chest: Effort normal and breath sounds normal. No respiratory distress. He has no wheezes. He has no rales. He exhibits no tenderness.  Abdominal: Soft. Bowel sounds are normal. He exhibits no distension and no mass. There is no tenderness. There is no rebound and no guarding.  Genitourinary: Rectum normal, prostate normal and penis normal. Guaiac negative stool. No  penile tenderness.  Musculoskeletal: Normal range of motion. He exhibits no edema or tenderness.  Lymphadenopathy:    He has no cervical adenopathy.  Neurological: He is alert and oriented to person, place, and time. He has normal reflexes. No cranial nerve deficit. He exhibits normal muscle tone. Coordination normal.  Skin: Skin is warm and dry. No rash noted. He is not diaphoretic. No erythema. No pallor.  Psychiatric: He has a normal mood and affect. His behavior is normal. Judgment and thought content normal.          Assessment & Plan:  Well exam. We discussed diet and exercise advice. We will set him up for carotid dopplers soon.

## 2014-09-21 NOTE — Progress Notes (Signed)
Pre visit review using our clinic review tool, if applicable. No additional management support is needed unless otherwise documented below in the visit note. 

## 2014-10-06 ENCOUNTER — Ambulatory Visit (HOSPITAL_COMMUNITY)
Admission: RE | Admit: 2014-10-06 | Discharge: 2014-10-06 | Disposition: A | Discharge status: Home / Self Care | Payer: Medicare Other | Source: Ambulatory Visit | Attending: Family Medicine | Admitting: Family Medicine

## 2014-10-06 DIAGNOSIS — I6523 Occlusion and stenosis of bilateral carotid arteries: Secondary | ICD-10-CM | POA: Diagnosis not present

## 2014-10-06 DIAGNOSIS — Z823 Family history of stroke: Secondary | ICD-10-CM

## 2014-11-03 ENCOUNTER — Telehealth: Payer: Self-pay

## 2014-11-03 ENCOUNTER — Ambulatory Visit (INDEPENDENT_AMBULATORY_CARE_PROVIDER_SITE_OTHER): Payer: Medicare Other | Admitting: Internal Medicine

## 2014-11-03 ENCOUNTER — Encounter: Payer: Self-pay | Admitting: Internal Medicine

## 2014-11-03 VITALS — BP 120/64 | HR 60 | Ht 69.5 in | Wt 242.5 lb

## 2014-11-03 DIAGNOSIS — Z7901 Long term (current) use of anticoagulants: Secondary | ICD-10-CM | POA: Diagnosis not present

## 2014-11-03 DIAGNOSIS — K625 Hemorrhage of anus and rectum: Secondary | ICD-10-CM

## 2014-11-03 DIAGNOSIS — Z5181 Encounter for therapeutic drug level monitoring: Secondary | ICD-10-CM

## 2014-11-03 DIAGNOSIS — K5909 Other constipation: Secondary | ICD-10-CM

## 2014-11-03 DIAGNOSIS — Z1211 Encounter for screening for malignant neoplasm of colon: Secondary | ICD-10-CM

## 2014-11-03 DIAGNOSIS — I480 Paroxysmal atrial fibrillation: Secondary | ICD-10-CM

## 2014-11-03 MED ORDER — NA SULFATE-K SULFATE-MG SULF 17.5-3.13-1.6 GM/177ML PO SOLN: 1.0000 | Freq: Once | ORAL | Status: DC

## 2014-11-03 NOTE — Telephone Encounter (Signed)
The patient can stop his oral anticoagulation 2 days prior to his endoscopic procedure and restart after the procedure when the gastroenterologist feels it is appropriate and safe

## 2014-11-03 NOTE — Telephone Encounter (Signed)
  11/03/2014   RE: Erik [name] DOB: 25-Aug-1945 MRN: [id number]   Dear Dr. [name],    We have scheduled the above patient for an endoscopic procedure. Our records show that he is on anticoagulation therapy.   Please advise as to how long the patient may come off his therapy of Eliquis prior to the procedure, which is scheduled for 11/17/2014.  Please fax back/ or route the completed form to [facility] at [phone].   Sincerely,    Erik [name]

## 2014-11-03 NOTE — Patient Instructions (Signed)

## 2014-11-03 NOTE — Progress Notes (Signed)
HISTORY OF PRESENT ILLNESS:  Erik [name] is a 69 y.o. male with hypertension, hyperlipidemia, and paroxysmal atrial fibrillation for which he is on Eliquis. He is sent today by his primary care provider Dr. Sarajane Jews with chief complaint of rectal bleeding. Patient did undergo complete colonoscopy in February 2007 to evaluate rectal bleeding. He was found to have mild sigmoid diverticulosis and internal hemorrhoids as the source of bleeding. Patient also has a history of an isolated episode of ischemic colitis on colonoscopy April 2001. No recurrent problems. Patient reports long-standing problems with rectal bleeding associated with defecation. This occurs with most bowel movements. The blood is bright red. Problem has been worse since initiating anticoagulation therapy this past April 4 new diagnosed atrial fibrillation. There is no associated rectal pain. He does describe constipation and will strain with bowel movements twice per week. He does not take anything for his bowels. No interval family history of colon cancer. No upper GI complaints. Outside records have been reviewed including office note from Dr. Sarajane Jews 08/25/2014 and blood work from that same day. Comprehensive metabolic panel and CBC were unremarkable with hemoglobin 16.0  REVIEW OF SYSTEMS:  All non-GI ROS negative except for shortness of breath (when he had atrial fibrillation) but none now  Past Medical History  Diagnosis Date  . Hyperlipidemia   . Hypertension   . Gout   . Chronic lower back pain   . Paroxysmal atrial fibrillation     sees Dr. Quay Burow   . Diverticulosis   . Internal hemorrhoids   . BPH with urinary obstruction   . Kidney stones   . Thyroid disease     Past Surgical History  Procedure Laterality Date  . Lumbar disc surgery  1998    2 lumbar discs, Dr. Ellene Route   . Hand surgery Right   . Colonoscopy  04-24-05    per Dr. Henrene Pastor, clear, repeat 10 yrs    Social History JAMICHAEL TAJ  reports that he  quit smoking about 19 years ago. His smoking use included Cigarettes. He has never used smokeless tobacco. He reports that he drinks alcohol. He reports that he does not use illicit drugs.  family history includes Cancer in his paternal aunt; Dementia in his mother; Diabetes in his paternal grandmother; Stroke in his father and paternal aunt.  No Known Allergies     PHYSICAL EXAMINATION: Vital signs: BP 120/64 mmHg  Pulse 60  Ht 5' 9.5" (1.765 m)  Wt 242 lb 8 oz (109.997 kg)  BMI 35.31 kg/m2  Constitutional: generally well-appearing, no acute distress Psychiatric: alert and oriented x3, cooperative Eyes: extraocular movements intact, anicteric, conjunctiva pink Mouth: oral pharynx moist, no lesions Neck: supple no lymphadenopathy Cardiovascular: heart regular rate and rhythm, no murmur Lungs: clear to auscultation bilaterally Abdomen: soft, nontender, nondistended, no obvious ascites, no peritoneal signs, normal bowel sounds, no organomegaly Rectal: Deferred until colonoscopy Extremities: no clubbing cyanosis or lower extremity edema bilaterally Skin: no lesions on visible extremities Neuro: No focal deficits. Cranial nerves intact.    ASSESSMENT:  #1. Chronic intermittent rectal bleeding. Worse after the initiation of anticoagulation. Suspect hemorrhoids. The patient is not anemic #2. Colonoscopy 10 years ago as described. Due for routine follow-up for colon cancer screening #3. Chronic anticoagulation for atrial fibrillation #4. Constipation. Suspect functional  PLAN:  #1. Daily fiber supplementation in the form of Metamucil or Citrucel #2. Colonoscopy for colon cancer screening and to evaluate rectal bleeding. Patient is high risk due to the need  to manage his anticoagulation #3. If bleeding source confirmed to be hemorrhoids, prescribe medical therapy in addition to fiber #4. Hold Eliquis for 3 days prior to the procedure. The pros and cons of discontinuing  anticoagulation discussed including the risk of thromboembolic disease versus procedure related bleeding. He understands. We will confirm with his cardiologist Dr. Quay Burow if this is acceptable   A copy of this consultation has been sent to Dr. Sarajane Jews and Dr. [name]

## 2014-11-04 NOTE — Telephone Encounter (Signed)
Told patient that per Dr. [name], he was to hold his Eliquis for 2 days prior to the procedure.  Patient acknowledged and understood

## 2014-11-17 ENCOUNTER — Ambulatory Visit (AMBULATORY_SURGERY_CENTER): Payer: Medicare Other | Admitting: Internal Medicine

## 2014-11-17 ENCOUNTER — Encounter: Payer: Self-pay | Admitting: Internal Medicine

## 2014-11-17 VITALS — BP 109/73 | HR 70 | Temp 96.2°F | Resp 18 | Ht 69.0 in | Wt 242.0 lb

## 2014-11-17 DIAGNOSIS — K625 Hemorrhage of anus and rectum: Secondary | ICD-10-CM | POA: Diagnosis not present

## 2014-11-17 DIAGNOSIS — Z1211 Encounter for screening for malignant neoplasm of colon: Secondary | ICD-10-CM | POA: Diagnosis not present

## 2014-11-17 DIAGNOSIS — I4891 Unspecified atrial fibrillation: Secondary | ICD-10-CM | POA: Diagnosis not present

## 2014-11-17 DIAGNOSIS — I10 Essential (primary) hypertension: Secondary | ICD-10-CM | POA: Diagnosis not present

## 2014-11-17 MED ORDER — HYDROCORTISONE ACETATE 25 MG RE SUPP: 25.0000 mg | Freq: Every evening | RECTAL | Status: DC | PRN

## 2014-11-17 MED ORDER — SODIUM CHLORIDE 0.9 % IV SOLN: 500.0000 mL | INTRAVENOUS | Status: DC

## 2014-11-17 NOTE — Progress Notes (Signed)
Transferred to recovery room. A/O x3, pleased with MAC.  VSS.  Report to Annette, RN. 

## 2014-11-17 NOTE — Progress Notes (Signed)
No problems noted in the recovery room. maw 

## 2014-11-17 NOTE — Patient Instructions (Signed)
YOU HAD AN ENDOSCOPIC PROCEDURE TODAY AT [facility]:   Refer to the procedure report that was given to you for any specific questions about what was found during the examination.  If the procedure report does not answer your questions, please call your gastroenterologist to clarify.  If you requested that your care partner not be given the details of your procedure findings, then the procedure report has been included in a sealed envelope for you to review at your convenience later.  YOU SHOULD EXPECT: Some feelings of bloating in the abdomen. Passage of more gas than usual.  Walking can help get rid of the air that was put into your GI tract during the procedure and reduce the bloating. If you had a lower endoscopy (such as a colonoscopy or flexible sigmoidoscopy) you may notice spotting of blood in your stool or on the toilet paper. If you underwent a bowel prep for your procedure, you may not have a normal bowel movement for a few days.  Please Note:  You might notice some irritation and congestion in your nose or some drainage.  This is from the oxygen used during your procedure.  There is no need for concern and it should clear up in a day or so.  SYMPTOMS TO REPORT IMMEDIATELY:   Following lower endoscopy (colonoscopy or flexible sigmoidoscopy):  Excessive amounts of blood in the stool  Significant tenderness or worsening of abdominal pains  Swelling of the abdomen that is new, acute  Fever of 100F or higher  For urgent or emergent issues, a gastroenterologist can be reached at any hour by calling [phone].   DIET: Your first meal following the procedure should be a small meal and then it is ok to progress to your normal diet. Heavy or fried foods are harder to digest and may make you feel nauseous or bloated.  Likewise, meals heavy in dairy and vegetables can increase bloating.  Drink plenty of fluids but you should avoid alcoholic beverages for 24  hours.  ACTIVITY:  You should plan to take it easy for the rest of today and you should NOT DRIVE or use heavy machinery until tomorrow (because of the sedation medicines used during the test).    FOLLOW UP: Our staff will call the number listed on your records the next business day following your procedure to check on you and address any questions or concerns that you may have regarding the information given to you following your procedure. If we do not reach you, we will leave a message.  However, if you are feeling well and you are not experiencing any problems, there is no need to return our call.  We will assume that you have returned to your regular daily activities without incident.  If any biopsies were taken you will be contacted by phone or by letter within the next 1-3 weeks.  Please call us at [phone] if you have not heard about the biopsies in 3 weeks.    SIGNATURES/CONFIDENTIALITY: You and/or your care partner have signed paperwork which will be entered into your electronic medical record.  These signatures attest to the fact that that the information above on your After Visit Summary has been reviewed and is understood.  Full responsibility of the confidentiality of this discharge information lies with you and/or your care-partner.    Handouts were given to your care partner on internal hemorrhoids, diverticulosis, and a high fiber diet with liberal fluid intake You may resume  your current medications today including your ELIQUIS. Continue taking metamucil 2 tbsp daily. Begin COLACE stool softeners 100mg  take 2 daily. (total of 200mg ) Please call if any questions or concerns.

## 2014-11-17 NOTE — Op Note (Signed)
[place]  [address] [place], [id number]   COLONOSCOPY PROCEDURE REPORT  PATIENT: Erik [name], Erik [name]  MR#: [id number] BIRTHDATE: 1945/10/28 , 77  yrs. old GENDER: male ENDOSCOPIST: Eustace Quail, MD REFERRED BY:.  Self / Office PROCEDURE DATE:  11/17/2014 PROCEDURE:   Colonoscopy, screening First Screening Colonoscopy - Avg.  risk and is 50 yrs.  old or older - No.  Prior Negative Screening - Now for repeat screening. 10 or more years since last screening  History of Adenoma - Now for follow-up colonoscopy & has been > or = to 3 yrs.  N/A  Polyps removed today? No Polyps removed today? No Recommend repeat exam, <10 yrs? No ASA CLASS:   Class III INDICATIONS:Screening for colonic neoplasia and Colorectal Neoplasm Risk Assessment for this procedure is average risk. . Index examination February 2007 (negative) MEDICATIONS: Monitored anesthesia care and Propofol 250 mg IV DESCRIPTION OF PROCEDURE:   After the risks benefits and alternatives of the procedure were thoroughly explained, informed consent was obtained.  The digital rectal exam revealed hemorrhoids(prolapsing but reducible internal hemorrhoids).   The LB [id number]  endoscope was introduced through the anus and advanced to the cecum, which was identified by both the appendix and ileocecal valve. No adverse events experienced.   The quality of the prep was excellent.  (Suprep was used)  The instrument was then slowly withdrawn as the colon was fully examined. Estimated blood loss is zero unless otherwise noted in this procedure report.  COLON FINDINGS: There was moderate diverticulosis noted in the sigmoid colon.   The examination was otherwise normal.  Retroflexed views revealed internal hemorrhoids. The time to cecum = 5.5 Withdrawal time = 9.4   The scope was withdrawn and the procedure completed. COMPLICATIONS: There were no immediate complications. ENDOSCOPIC IMPRESSION: 1.   Moderate  diverticulosis was noted in the sigmoid colon 2.   The examination was otherwise normal 3.   Internal hemorrhoids  RECOMMENDATIONS: 1. Continue Metamucil 2 tablespoons daily 2. Begin Colace stool softener 100 mg. Take 2 daily (total of 200 mg) 3. Prescribe Anusol HC rectal suppository; #30; 6 refills; 1 per rectum at night as needed 4. Continue current colorectal screening recommendations for "routine risk" patients with a repeat colonoscopy in 10 years. 5. Resume Eliquis today  eSigned:  Eustace Quail, MD 11/17/2014 9:53 AM   cc: The Patient and [name], MD

## 2014-11-18 ENCOUNTER — Telehealth: Payer: Self-pay | Admitting: Emergency Medicine

## 2014-11-18 NOTE — Telephone Encounter (Signed)
Left message, no identifier 

## 2014-12-20 ENCOUNTER — Other Ambulatory Visit: Payer: Self-pay | Admitting: Cardiovascular Disease

## 2015-01-11 DIAGNOSIS — M26623 Arthralgia of bilateral temporomandibular joint: Secondary | ICD-10-CM | POA: Diagnosis not present

## 2015-01-11 DIAGNOSIS — M26633 Articular disc disorder of bilateral temporomandibular joint: Secondary | ICD-10-CM | POA: Diagnosis not present

## 2015-04-20 ENCOUNTER — Telehealth: Payer: Self-pay

## 2015-04-20 NOTE — Telephone Encounter (Signed)
Patient walked in office requesting eliquis samples.Eliquis 2.5 mg samples take 2 tablets twice a day given.Stated eliquis too expensive.Advised to call his insurance to make sure eliquis is on their formulary.Advised to call back if needs to change to a different coag.Follow up appointment scheduled with Dr.Berry 05/18/15 at 11:45 am.

## 2015-05-18 ENCOUNTER — Encounter: Payer: Self-pay | Admitting: Cardiovascular Disease

## 2015-05-18 ENCOUNTER — Ambulatory Visit (INDEPENDENT_AMBULATORY_CARE_PROVIDER_SITE_OTHER): Payer: Medicare Other | Admitting: Cardiovascular Disease

## 2015-05-18 VITALS — BP 118/80 | HR 112 | Ht 71.0 in | Wt 242.0 lb

## 2015-05-18 DIAGNOSIS — I4891 Unspecified atrial fibrillation: Secondary | ICD-10-CM

## 2015-05-18 DIAGNOSIS — I480 Paroxysmal atrial fibrillation: Secondary | ICD-10-CM

## 2015-05-18 DIAGNOSIS — E785 Hyperlipidemia, unspecified: Secondary | ICD-10-CM | POA: Diagnosis not present

## 2015-05-18 DIAGNOSIS — R0602 Shortness of breath: Secondary | ICD-10-CM | POA: Diagnosis not present

## 2015-05-18 DIAGNOSIS — I10 Essential (primary) hypertension: Secondary | ICD-10-CM

## 2015-05-18 MED ORDER — METOPROLOL TARTRATE 50 MG PO TABS: 50.0000 mg | ORAL_TABLET | Freq: Two times a day (BID) | ORAL | Status: DC

## 2015-05-18 NOTE — Patient Instructions (Addendum)
Medication Instructions:  Your physician has recommended you make the following change in your medication:  1) INCREASE Lopressor to 50 mg by mouth TWICE daily   Labwork: none  Testing/Procedures: Your physician has recommended that you wear an event monitor. Event monitors are medical devices that record the heart's electrical activity. Doctors most often [place] these monitors to diagnose arrhythmias. Arrhythmias are problems with the speed or rhythm of the heartbeat. The monitor is a small, portable device. You can wear one while you do your normal daily activities. This is usually used to diagnose what is causing palpitations/syncope (passing out). 30 Days - at [place]: Your physician recommends that you schedule a follow-up appointment in: 3/23 appt with Erasmo Downer in [facility] - need to talk about Eliquis  Your physician recommends that you schedule a follow-up appointment in: 6 weeks with Dr. [name]    Any Other Special Instructions Will Be Listed Below (If Applicable).  Your Doctor has ordered you to wear a heart monitor. You will wear this for 30 days.   TIPS -  REMINDERS 1. The sensor is the lanyard that is worn around your neck every day - this is powered by a battery that needs to be changed every day 2. The monitor is the device that allows you to record symptoms - this will need to be charged daily 3. The sensor & monitor need to be within 100 feet of each other at all times 4. The sensor connects to the electrodes (stickers) - these should be changed every 24-48 hours (you do not have to remove them when you bathe, just make sure they are dry when you connect it back to the sensor 5. If you need more supplies (electrodes, batteries), please call the 1-800 # on the back of the pamphlet and [employer] will mail you more supplies 6. If your skin becomes sensitive, please try the sample pack of sensitive skin electrodes (the white packet in your silver box) and  call [employer] to have them mail you more of these type of electrodes 7. When you are finish wearing the monitor, please place all supplies back in the silver box, place the silver box in the pre-packaged UPS bag and drop off at [employer] or call them so they can come pick it up   Cardiac Event Monitoring A cardiac event monitor is a small recording device used to help detect abnormal heart rhythms (arrhythmias). The monitor is used to record heart rhythm when noticeable symptoms such as the following occur:  Fast heartbeats (palpitations), such as heart racing or fluttering.  Dizziness.  Fainting or light-headedness.  Unexplained weakness. The monitor is wired to two electrodes placed on your chest. Electrodes are flat, sticky disks that attach to your skin. The monitor can be worn for up to 30 days. You will wear the monitor at all times, except when bathing.  HOW TO USE YOUR CARDIAC EVENT MONITOR A technician will prepare your chest for the electrode placement. The technician will show you how to place the electrodes, how to work the monitor, and how to replace the batteries. Take time to practice using the monitor before you leave the office. Make sure you understand how to send the information from the monitor to your health care provider. This requires a telephone with a landline, not a cell phone. You need to:  Wear your monitor at all times, except when you are in water:  Do not get the monitor wet.  Take  the monitor off when bathing. Do not swim or use a hot tub with it on.  Keep your skin clean. Do not put body lotion or moisturizer on your chest.  Change the electrodes daily or any time they stop sticking to your skin. You might need to use tape to keep them on.  It is possible that your skin under the electrodes could become irritated. To keep this from happening, try to put the electrodes in slightly different places on your chest. However, they must remain in the area under your  left breast and in the upper right section of your chest.  Make sure the monitor is safely clipped to your clothing or in a location close to your body that your health care provider recommends.  Press the button to record when you feel symptoms of heart trouble, such as dizziness, weakness, light-headedness, palpitations, thumping, shortness of breath, unexplained weakness, or a fluttering or racing heart. The monitor is always on and records what happened slightly before you pressed the button, so do not worry about being too late to get good information.  Keep a diary of your activities, such as walking, doing chores, and taking medicine. It is especially important to note what you were doing when you pushed the button to record your symptoms. This will help your health care provider determine what might be contributing to your symptoms. The information stored in your monitor will be reviewed by your health care provider alongside your diary entries.  Send the recorded information as recommended by your health care provider. It is important to understand that it will take some time for your health care provider to process the results.  Change the batteries as recommended by your health care provider. SEEK IMMEDIATE MEDICAL CARE IF:   You have chest pain.  You have extreme difficulty breathing or shortness of breath.  You develop a very fast heartbeat that persists.  You develop dizziness that does not go away.  You faint or constantly feel you are about to faint. Document Released: 11/22/2007 Document Revised: 06/29/2013 Document Reviewed: 08/11/2012 [facility] Patient Information 2015 [place], [place]. This information is not intended to replace advice given to you by your health care provider. Make sure you discuss any questions you have with your health care provider.     If you need a refill on your cardiac medications before your next appointment, please call your pharmacy.

## 2015-05-18 NOTE — Assessment & Plan Note (Signed)
History of hypertension blood pressure measured 118/80. He is on metoprolol, losartan and hydrochlorothiazide. Continue current meds at current dosing

## 2015-05-18 NOTE — Assessment & Plan Note (Signed)
History of hyperlipidemia on statin therapy with the profile performed 6/29/16Total cholesterol 146, LDL 79 and HDL of 35

## 2015-05-18 NOTE — Progress Notes (Signed)
05/18/2015 Erik [name]   Oct 10, 1945  [id number]  Primary Physician [name], MD Primary Cardiologist: Lorretta Harp MD Renae Gloss   HPI:  [name] is a 70 year old mild to moderately overweight married Caucasian male father of one child . His wife Erik [name] who works as a Sales promotion account executive. I last saw him in the office 06/09/14. His cardiovascular risk factor profile notable for treated hypertension and hyperlipidemia. He apparently had a negative cath 10 years ago by Dr. Johnsie Cancel in for chest pain. He has not felt well for the last month complaining of increasing dyspnea on exertion. He recently took a trip to [facility] and drank 30 shots within 16 hours. On the way back in the airplane he developed chest pain and was seen when he landed at [facility] emergency room where he was found to be in A. Fib with RVR with a heart rate of 170. His heart rate was slowed with IV diltiazem and then he was given procainamide and converted to sinus rhythm. His troponins were negative. He felt clinically improved. A TSH was normal. Today the office he is in A. Fib with a heart rate of 112. I'm going to add low-dose beta blocker (Lopressor 25 mg by mouth twice a day), Eliquis obtain a 2-D echo and from left Myoview stress test. We will also check a basic metabolic panel. When I saw him back in follow-up on 06/09/14 he had converted to sinus rhythm. He complained of increasing shortness of breath over the last several months. He is in A. Fib with ventricular response of 112 today. There are concerns about the cost of Eloquis which will need to be addressed. I'm going to get a 30 day event monitor to assess whether or not he is in an out of A. Fib or has chronic A. Fib at this point and will after that base decision regarding cardioversion versus antiarrhythmic medications.  Current Outpatient Prescriptions  Medication Sig Dispense Refill  . aspirin EC 81 MG tablet Take 81 mg by mouth at  bedtime.    [name] ELIQUIS 5 MG TABS tablet TAKE 1 TABLET (5 MG TOTAL) BY MOUTH 2 (TWO) TIMES DAILY. 180 tablet 1  . hydrocortisone (ANUSOL-HC) 25 MG suppository Place 1 suppository (25 mg total) rectally at bedtime as needed for hemorrhoids or itching. One suppository per rectum at night as needed. 30 suppository 6  . ibuprofen (ADVIL,MOTRIN) 200 MG tablet Take 600 mg by mouth 3 (three) times daily as needed (pain).     [name] levothyroxine (SYNTHROID, LEVOTHROID) 75 MCG tablet Take 1 tablet (75 mcg total) by mouth daily. 30 tablet 11  . losartan-hydrochlorothiazide (HYZAAR) 100-25 MG per tablet Take 1 tablet by mouth daily. 90 tablet 3  . metoprolol tartrate (LOPRESSOR) 25 MG tablet Take 1 tablet (25 mg total) by mouth 2 (two) times daily. 180 tablet 3  . simvastatin (ZOCOR) 20 MG tablet Take 1 tablet (20 mg total) by mouth daily. 90 tablet 3   No current facility-administered medications for this visit.    No Known Allergies  Social History   Social History  . Marital Status: Married    Spouse Name: N/A  . Number of Children: 1  . Years of Education: N/A   Occupational History  . retired    Social History Main Topics  . Smoking status: Former Smoker    Types: Cigarettes    Quit date: 02/27/1995  . Smokeless tobacco: Never Used  . Alcohol Use:  0.0 oz/week    0 Standard drinks or equivalent per week     Comment: couple times a month  . Drug Use: No  . Sexual Activity: Not on file   Other Topics Concern  . Not on file   Social History Narrative     Review of Systems: General: negative for chills, fever, night sweats or weight changes.  Cardiovascular: negative for chest pain, dyspnea on exertion, edema, orthopnea, palpitations, paroxysmal nocturnal dyspnea or shortness of breath Dermatological: negative for rash Respiratory: negative for cough or wheezing Urologic: negative for hematuria Abdominal: negative for nausea, vomiting, diarrhea, bright red blood per rectum, melena, or  hematemesis Neurologic: negative for visual changes, syncope, or dizziness All other systems reviewed and are otherwise negative except as noted above.    Blood pressure 118/80, pulse 112, height 5\' 11"  (1.803 m), weight 242 lb (109.77 kg).  General appearance: alert and no distress Neck: no adenopathy, no carotid bruit, no JVD, supple, symmetrical, trachea midline and thyroid not enlarged, symmetric, no tenderness/mass/nodules Lungs: clear to auscultation bilaterally Heart: irregularly irregular rhythm Extremities: extremities normal, atraumatic, no cyanosis or edema  EKG atrophic ablation with ventricular response of 112 and nonspecific ST and T-wave changes. I personally reviewed this EKG  ASSESSMENT AND PLAN:   Hyperlipidemia History of hyperlipidemia on statin therapy with the profile performed 6/29/16Total cholesterol 146, LDL 79 and HDL of 35  Essential hypertension History of hypertension blood pressure measured 118/80. He is on metoprolol, losartan and hydrochlorothiazide. Continue current meds at current dosing  Paroxysmal atrial fibrillation History of PAF in the past currently in A. Fib with a ventricular response of 112. He is on Eliquis oral anticoagulation. The CHA2DSVASC2 score is  2 .I'm going to increase his beta blocker for rate control, obtain a different oral adequate coagulant because of cost considerations ranging undergo outpatient DC cardioversion in 4-5 weeks after being on uninterrupted anticoagulation. We will also check some routine labs including TSH and free T4. He complains of being short of breath last several months which I think may be related to his A. Fib. History of echo has been normal in the past with normal left atrial size.      Lorretta Harp MD FACP,FACC,FAHA, [facility] 05/18/2015 12:34 PM

## 2015-05-18 NOTE — Assessment & Plan Note (Signed)
History of PAF in the past currently in A. Fib with a ventricular response of 112. He is on Eliquis oral anticoagulation. The CHA2DSVASC2 score is  2 .I'm going to increase his beta blocker for rate control, obtain a different oral adequate coagulant because of cost considerations ranging undergo outpatient DC cardioversion in 4-5 weeks after being on uninterrupted anticoagulation. We will also check some routine labs including TSH and free T4. He complains of being short of breath last several months which I think may be related to his A. Fib. History of echo has been normal in the past with normal left atrial size.

## 2015-05-19 ENCOUNTER — Ambulatory Visit (INDEPENDENT_AMBULATORY_CARE_PROVIDER_SITE_OTHER): Payer: Medicare Other | Admitting: Pharmacist Clinician (PhC)/ Clinical Pharmacy Specialist

## 2015-05-19 ENCOUNTER — Encounter: Payer: Self-pay | Admitting: Pharmacist Clinician (PhC)/ Clinical Pharmacy Specialist

## 2015-05-19 DIAGNOSIS — I4891 Unspecified atrial fibrillation: Secondary | ICD-10-CM | POA: Diagnosis not present

## 2015-05-19 LAB — BASIC METABOLIC PANEL
BUN: 19 mg/dL (ref 7–25)
CHLORIDE: 104 mmol/L (ref 98–110)
CO2: 29 mmol/L (ref 20–31)
Calcium: 9.9 mg/dL (ref 8.6–10.3)
Creat: 1.41 mg/dL — ABNORMAL HIGH (ref 0.70–1.25)
Glucose, Bld: 97 mg/dL (ref 65–99)
POTASSIUM: 4 mmol/L (ref 3.5–5.3)
SODIUM: 143 mmol/L (ref 135–146)

## 2015-05-19 LAB — TSH: TSH: 4.21 m[IU]/L (ref 0.40–4.50)

## 2015-05-19 LAB — T4, FREE: FREE T4: 1.4 ng/dL (ref 0.8–1.8)

## 2015-05-19 MED ORDER — RIVAROXABAN 20 MG PO TABS: 20.0000 mg | ORAL_TABLET | Freq: Every day | ORAL | Status: DC

## 2015-05-19 NOTE — Progress Notes (Signed)
Patient in office today with concern about increasing price on Eliquis.  Patient has [facility], which has moved Eliquis to a Tier 4 drug.  Current price is $598/90.  Will switch patient to Xarelto 20 mg once daily with food, a lower tier medication.  Explained how to transition from Eliquis and gave patient 2 weeks samples and 30 day free card.

## 2015-05-19 NOTE — Patient Instructions (Signed)
Continue with Eliquis until it is gone.  Take your last dose of Eliquis 12 hours before starting Xarelto.  Take Xarelto with food

## 2015-05-23 ENCOUNTER — Ambulatory Visit (INDEPENDENT_AMBULATORY_CARE_PROVIDER_SITE_OTHER): Payer: Medicare Other

## 2015-05-23 DIAGNOSIS — R0602 Shortness of breath: Secondary | ICD-10-CM | POA: Diagnosis not present

## 2015-05-23 DIAGNOSIS — I480 Paroxysmal atrial fibrillation: Secondary | ICD-10-CM

## 2015-05-23 DIAGNOSIS — E785 Hyperlipidemia, unspecified: Secondary | ICD-10-CM | POA: Diagnosis not present

## 2015-05-23 DIAGNOSIS — I10 Essential (primary) hypertension: Secondary | ICD-10-CM

## 2015-05-23 DIAGNOSIS — I4891 Unspecified atrial fibrillation: Secondary | ICD-10-CM | POA: Diagnosis not present

## 2015-05-28 ENCOUNTER — Telehealth: Payer: Self-pay | Admitting: Cardiology

## 2015-05-28 NOTE — Telephone Encounter (Signed)
[employer] telemetry monitoring called to state that patient was in atrial fibrillation which he has been in but now heart rate 160bpm.  Tried to call patient several times with no answer and left a message for patient to call back.

## 2015-05-28 NOTE — Telephone Encounter (Signed)
Patient returning call regarding rapid afib.  He was out mowing the yard that the time of the transmission.  Heart rate currently 110bpm and he has not taken his metoprolol yet.  I have instructed him to take his evening dose of BB now.  Will forward to Dr. [name] for further instructions.

## 2015-05-29 NOTE — Telephone Encounter (Signed)
Prob needs to see a MLP to review event monitor and adjust meds before he sees me back to discuss OP DCCV  JJB

## 2015-05-30 NOTE — Telephone Encounter (Signed)
End of day - received faxed urgent notification reports from weekend consistent w/ documented events.   Attempted to reach patient and received no answer at listed number when dialed.

## 2015-06-02 ENCOUNTER — Telehealth: Payer: Self-pay

## 2015-06-02 NOTE — Telephone Encounter (Signed)
Left message to call back Pt has Heart Monitor alert of A. Fib with RVR HR of 150 @ 2pm.

## 2015-06-06 ENCOUNTER — Telehealth: Payer: Self-pay | Admitting: *Deleted

## 2015-06-06 NOTE — Telephone Encounter (Signed)
Received faxes from [employer]. Reviewed with DOD, Dr Martinique. No action needed at this time. Will await for Dr [name] to review. Monitor strips placed in Dr Kennon Holter box.

## 2015-06-07 NOTE — Telephone Encounter (Signed)
Left message to call back.  Pt had increase HR and need to move up appt and want to check pt activity during the time of documented increased HR's

## 2015-06-09 ENCOUNTER — Telehealth: Payer: Self-pay | Admitting: Cardiovascular Disease

## 2015-06-09 NOTE — Telephone Encounter (Signed)
New message    Pt is returning rn phone call about monitor

## 2015-06-09 NOTE — Telephone Encounter (Signed)
  Spoke with patient and scheduled appointment with Allie Bossier NP 06/14/15    Newt Minion, RN at 06/07/2015 9:39 AM     Status: Signed       Expand All Collapse All   Left message to call back.  Pt had increase HR and need to move up appt and want to check pt activity during the time of documented increased HR's            Vanessa Ralphs, RN at 06/06/2015 2:30 PM     Status: Signed       Expand All Collapse All   Received faxes from [employer]. Reviewed with DOD, Dr Martinique. No action needed at this time. Will await for Dr [name] to review. Monitor strips placed in Dr Kennon Holter box.

## 2015-06-14 ENCOUNTER — Telehealth: Payer: Self-pay | Admitting: Cardiovascular Disease

## 2015-06-14 ENCOUNTER — Encounter: Payer: Self-pay | Admitting: Nurse Practitioner

## 2015-06-14 ENCOUNTER — Ambulatory Visit (INDEPENDENT_AMBULATORY_CARE_PROVIDER_SITE_OTHER): Payer: Medicare Other | Admitting: Nurse Practitioner

## 2015-06-14 VITALS — BP 118/73 | HR 71 | Ht 71.0 in | Wt 247.8 lb

## 2015-06-14 DIAGNOSIS — I2510 Atherosclerotic heart disease of native coronary artery without angina pectoris: Secondary | ICD-10-CM | POA: Insufficient documentation

## 2015-06-14 DIAGNOSIS — I739 Peripheral vascular disease, unspecified: Secondary | ICD-10-CM

## 2015-06-14 DIAGNOSIS — E039 Hypothyroidism, unspecified: Secondary | ICD-10-CM | POA: Diagnosis not present

## 2015-06-14 DIAGNOSIS — I779 Disorder of arteries and arterioles, unspecified: Secondary | ICD-10-CM

## 2015-06-14 DIAGNOSIS — E785 Hyperlipidemia, unspecified: Secondary | ICD-10-CM

## 2015-06-14 DIAGNOSIS — I119 Hypertensive heart disease without heart failure: Secondary | ICD-10-CM | POA: Insufficient documentation

## 2015-06-14 DIAGNOSIS — I480 Paroxysmal atrial fibrillation: Secondary | ICD-10-CM | POA: Diagnosis not present

## 2015-06-14 MED ORDER — METOPROLOL TARTRATE 50 MG PO TABS: 75.0000 mg | ORAL_TABLET | Freq: Two times a day (BID) | ORAL | Status: DC

## 2015-06-14 NOTE — Telephone Encounter (Signed)
Received a call from [employer] Patient having Afib RVR with rate of 170, asymptomatic He does have ov with Dr Curt Bears tomorrow  Reviewed with Allie Bossier NP, keep ov as scheduled

## 2015-06-14 NOTE — Progress Notes (Signed)
Electrophysiology Office Note   Date:  06/15/2015   ID:  Erik [name], DOB [birth year], MRN [id number]  PCP:  [name], MD  Cardiologist:  [name] Primary Electrophysiologist:  Maxi Rodas [name], MD    Chief Complaint  Patient presents with  . Atrial Fibrillation  . Advice Only     History of Present Illness: Erik [name] is a 70 y.o. male who presents today for electrophysiology evaluation.   He presents today for workup of his atrial fibrillation.  He has a history of nonobstructive coronary artery disease, hypertension, hyperlipidemia, hypothyroidism. In March 2016, he was diagnosed with atrial fibrillation with RVR in the setting of chest pain. He converted to sinus rhythm in the emergency room and subsequently underwent stress testing felt to be low risk. He says that he has been having symptoms of atrial fibrillation, including weakness, fatigue, and shortness of breath. That being said, he has done reasonably well since he is was diagnosed. He feels like he is in atrial fibrillation most of the time, but he has not been having much in the way of chest pain. He did have a monitor placed that showed his A. fib burden between 30-100% per day. He is generally in the low to mid 90s.   Today, he denies symptoms of palpitations, chest pain, shortness of breath, orthopnea, PND, lower extremity edema, claudication, dizziness, presyncope, syncope, bleeding, or neurologic sequela. The patient is tolerating medications without difficulties and is otherwise without complaint today.    Past Medical History  Diagnosis Date  . Hyperlipidemia   . Hypertensive heart disease   . Gout   . Chronic lower back pain   . Paroxysmal atrial fibrillation ([place])     a. Dx 04/2014; b. 05/2014 Echo: Ef 60-65%, no rwma, triv MR/TR, nl RV; c. CHA2DS2VASc = 3-->was on eliquis, switched to xarelto 04/2015 2/2 cost.  . Diverticulosis   . Internal hemorrhoids   . BPH with urinary obstruction   .  Nephrolithiasis   . Hypothyroidism   . Non-obstructive CAD     a. 07/2002 Cath: LM 20, LAD 20m/d, LCX 50-40m, OM1 24m, RCA 73m, EF 60%; b. 05/2014 MV: low risk w/ small sized, mild intensity rev defect in apical/inferior/infsept area, nl EF->Med Rx.  . Carotid disease, bilateral ([place])     a. 09/2014 Carotid U/S: 1-39% bilat ICA stenosis.   Past Surgical History  Procedure Laterality Date  . Lumbar disc surgery  1998    2 lumbar discs, Dr. Ellene Route   . Hand surgery Right   . Colonoscopy  04-24-05    per Dr. Henrene Pastor, clear, repeat 10 yrs     Current Outpatient Prescriptions  Medication Sig Dispense Refill  . aspirin EC 81 MG tablet Take 81 mg by mouth at bedtime.    . hydrocortisone (ANUSOL-HC) 25 MG suppository Place 1 suppository (25 mg total) rectally at bedtime as needed for hemorrhoids or itching. One suppository per rectum at night as needed. 30 suppository 6  . ibuprofen (ADVIL,MOTRIN) 200 MG tablet Take 600 mg by mouth 3 (three) times daily as needed (pain).     [name] levothyroxine (SYNTHROID, LEVOTHROID) 75 MCG tablet Take 1 tablet (75 mcg total) by mouth daily. 30 tablet 11  . losartan-hydrochlorothiazide (HYZAAR) 100-25 MG per tablet Take 1 tablet by mouth daily. 90 tablet 3  . metoprolol (LOPRESSOR) 50 MG tablet Take 1 tablet (50 mg total) by mouth 2 (two) times daily. 180 tablet 3  . rivaroxaban (XARELTO) 20 MG  TABS tablet Take 1 tablet (20 mg total) by mouth daily with supper. 30 tablet 0  . simvastatin (ZOCOR) 20 MG tablet Take 1 tablet (20 mg total) by mouth daily. 90 tablet 3   No current facility-administered medications for this visit.    Allergies:   Review of patient's allergies indicates no known allergies.   Social History:  The patient  reports that he quit smoking about 20 years ago. His smoking use included Cigarettes. He has never used smokeless tobacco. He reports that he drinks alcohol. He reports that he does not use illicit drugs.   Family History:  The patient's  family history includes Cancer in his paternal aunt; Dementia in his mother; Diabetes in his paternal grandmother; Stroke in his father and paternal aunt.    ROS:  Please see the history of present illness.   Otherwise, review of systems is positive for none.   All other systems are reviewed and negative.    PHYSICAL EXAM: VS:  BP 118/86 mmHg  Pulse 76  Ht 5' 11.5" (1.816 m)  Wt 247 lb 3.2 oz (112.129 kg)  BMI 34.00 kg/m2 , BMI Body mass index is 34 kg/(m^2). GEN: Well nourished, well developed, in no acute distress HEENT: normal Neck: no JVD, carotid bruits, or masses Cardiac: irregular; no murmurs, rubs, or gallops,no edema  Respiratory:  clear to auscultation bilaterally, normal work of breathing GI: soft, nontender, nondistended, + BS MS: no deformity or atrophy Skin: warm and dry Neuro:  Strength and sensation are intact Psych: euthymic mood, full affect  EKG:  EKG is not ordered today. The ekg ordered yesterday shows atrial fibrillation, rate 109  Recent Labs: 08/25/2014: ALT 24; Hemoglobin 16.0; Platelets 225.0 05/18/2015: BUN 19; Creat 1.41*; Potassium 4.0; Sodium 143; TSH 4.21    Lipid Panel     Component Value Date/Time   CHOL 146 08/25/2014 1135   TRIG 157.0* 08/25/2014 1135   TRIG 203* 12/10/2005 0839   HDL 35.60* 08/25/2014 1135   CHOLHDL 4 08/25/2014 1135   CHOLHDL 5.6 CALC 12/10/2005 0839   VLDL 31.4 08/25/2014 1135   LDLCALC 79 08/25/2014 1135   LDLDIRECT 104.9 06/09/2009 0919   LDLDIRECT 104.9 12/10/2005 0839     Wt Readings from Last 3 Encounters:  06/15/15 247 lb 3.2 oz (112.129 kg)  06/14/15 247 lb 12.8 oz (112.401 kg)  05/18/15 242 lb (109.77 kg)      Other studies Reviewed: Additional studies/ records that were reviewed today include: TTE 06/03/14, Myoview 06/03/14 Review of the above records today demonstrates:  - Normal biventricular size and function. Moderate concentric LVH. Trivial mitral and tricupsid regurgitation, otherwise  normal study.  Low risk stress nuclear study with a small sized, mild intensity reversible defect concering for mild apical-inferior/inferoseptal ischeima. Clinical Correlation is recommended..   ASSESSMENT AND PLAN:  1.  Paroxysmal atrial fibrillation: Currently on Xarelto for a CHADS2VASc of 2.  Currently he is having symptoms of fatigue, dyspnea, and shortness of breath. He is in atrial fibrillation, and therefore would likely benefit from being in normal rhythm. I discussed with him the options of medication for his atrial fibrillation. He does not feel like amiodarone is the best choice as he is young and worried about long-term side effects. He does have a history of nonobstructive coronary disease and therefore flecainide would not be the best option. I have discussed with him the option of loading with decrease in, and he feels like this is the best option. We'll set him  up for admission to the hospital and tedious and load. There was some confusion in the past about his metoprolol dose. He has been taking 25 per day at home where it was thought that he was on 50 per day. We Kameryn Davern increase his 25 up to 50 per day for improved rate control at this time. He also does snore, and we Mairely Foxworth refer him for sleep study.  2. Hypertension: Well-controlled  This patients CHA2DS2-VASc Score and unadjusted Ischemic Stroke Rate (% per year) is equal to 2.2 % stroke rate/year from a score of 2  Above score calculated as 1 point each if present [CHF, HTN, DM, Vascular=MI/PAD/Aortic Plaque, Age if 65-74, or Male] Above score calculated as 2 points each if present [Age > 75, or Stroke/TIA/TE]  Current medicines are reviewed at length with the patient today.   The patient does not have concerns regarding his medicines.  The following changes were made today:  Metoprolol 50 mg BID  Labs/ tests ordered today include:  Orders Placed This Encounter  Procedures  . Split night study     Disposition:    FU with Tishanna Dunford post [employer], Binta Statzer [name], MD  06/15/2015 10:43 AM     [facility] [address] [place] [address] [phone] (office) [phone] (faHe was referred by by his primaryx)

## 2015-06-14 NOTE — Progress Notes (Signed)
Office Visit    Patient Name: Erik [name] Date of Encounter: 06/14/2015  Primary Care Provider:  [name], MD Primary Cardiologist:  Adora Fridge, MD   Chief Complaint    70 y/o ? with a history of paroxysmal atrial fibrillation who presents for follow-up.  Past Medical History    Past Medical History  Diagnosis Date  . Hyperlipidemia   . Hypertensive heart disease   . Gout   . Chronic lower back pain   . Paroxysmal atrial fibrillation ([place])     a. Dx 04/2014; b. 05/2014 Echo: Ef 60-65%, no rwma, triv MR/TR, nl RV; c. CHA2DS2VASc = 3-->was on eliquis, switched to xarelto 04/2015 2/2 cost.  . Diverticulosis   . Internal hemorrhoids   . BPH with urinary obstruction   . Nephrolithiasis   . Hypothyroidism   . Non-obstructive CAD     a. 07/2002 Cath: LM 20, LAD 41m/d, LCX 50-57m, OM1 2m, RCA 40m, EF 60%; b. 05/2014 MV: low risk w/ small sized, mild intensity rev defect in apical/inferior/infsept area, nl EF->Med Rx.  . Carotid disease, bilateral ([place])     a. 09/2014 Carotid U/S: 1-39% bilat ICA stenosis.   Past Surgical History  Procedure Laterality Date  . Lumbar disc surgery  1998    2 lumbar discs, Dr. Ellene Route   . Hand surgery Right   . Colonoscopy  04-24-05    per Dr. Henrene Pastor, clear, repeat 10 yrs    Allergies  No Known Allergies  History of Present Illness    70 year old male with the above complex past medical history. He has a history of nonobstructive coronary artery disease, hypertension, hyperlipidemia, and hypothyroidism. In March 2016, he was diagnosed with A. Fib with RVR in the setting of chest pain. He converted to sinus rhythm in the emergency room and subsequently underwent stress testing which was felt to be low risk. He has been maintained on beta blocker and oral anticoagulation therapy and recently followed up with Dr. [name] in late March secondary to intermittent dyspnea and fatigue. He was found to be in atrial fibrillation at a rate of 112 bpm. It  wasn't clear if this was paroxysmal or more persistent and as result, an event monitor was placed. He was also switched off of eliquis and on 2 xarelto secondary to cost. Since that appointment, he says he has done reasonably well. He has  Had only one or 2 occasions where he noted tachypalpitations but states that otherwise he has been asymptomatic from an A. Fib standpoint. He occasionally notes fatigue or dyspnea on exertion however. He has not been having any significant chest pain. Review of his event monitoring shows baseline heart rates in the 90s to 100s. From one day to the next, his A. Fib burden is shown to be anywhere between as low as 30% to as high as 100%, though he is generally in the low to mid 90% [place] range.  All available rhythm strips show A. Fib.   Home Medications    Prior to Admission medications   Medication Sig Start Date End Date Taking? Authorizing Provider  aspirin EC 81 MG tablet Take 81 mg by mouth at bedtime.   Yes Historical Provider, MD  hydrocortisone (ANUSOL-HC) 25 MG suppository Place 1 suppository (25 mg total) rectally at bedtime as needed for hemorrhoids or itching. One suppository per rectum at night as needed. 11/17/14  Yes Irene Shipper, MD  ibuprofen (ADVIL,MOTRIN) 200 MG tablet Take 600 mg by  mouth 3 (three) times daily as needed (pain).    Yes Historical Provider, MD  levothyroxine (SYNTHROID, LEVOTHROID) 75 MCG tablet Take 1 tablet (75 mcg total) by mouth daily. 09/07/14  Yes [name], MD  losartan-hydrochlorothiazide (HYZAAR) 100-25 MG per tablet Take 1 tablet by mouth daily. 09/21/14  Yes [name], MD  metoprolol (LOPRESSOR) 50 MG tablet Take 1.5 tablets (75 mg total) by mouth 2 (two) times daily. 06/14/15  Yes Rogelia Mire, NP  rivaroxaban (XARELTO) 20 MG TABS tablet Take 1 tablet (20 mg total) by mouth daily with supper. 05/19/15  Yes Lorretta Harp, MD  rivaroxaban (XARELTO) 20 MG TABS tablet Take 1 tablet (20 mg total) by mouth daily  with supper. 05/19/15  Yes Lorretta Harp, MD  simvastatin (ZOCOR) 20 MG tablet Take 1 tablet (20 mg total) by mouth daily. 09/21/14  Yes [name], MD    Review of Systems    As above, he does intermittently expressed dyspnea on exertion and sluggishness/fatigue. He head had only 1 or 2 episodes of tachypalpitations. he denies chest pain, PND, orthopnea, dizziness, syncope, edema, or early satiety.  All other systems reviewed and are otherwise negative except as noted above.  Physical Exam    VS:  BP 118/73 mmHg  Pulse 71  Ht 5\' 11"  (1.803 m)  Wt 247 lb 12.8 oz (112.401 kg)  BMI 34.58 kg/m2 , BMI Body mass index is 34.58 kg/(m^2). GEN: Well nourished, well developed, in no acute distress. HEENT: normal. Neck: Supple, no JVD, carotid bruits, or masses. Cardiac: irregularly irregular, no murmurs, rubs, or gallops. No clubbing, cyanosis, edema.  Radials/DP/PT 2+ and equal bilaterally.  Respiratory:  Respirations regular and unlabored, clear to auscultation bilaterally. GI: Soft, nontender, nondistended, BS + x 4. MS: no deformity or atrophy. Skin: warm and dry, no rash. Neuro:  Strength and sensation are intact. Psych: Normal affect.  Accessory Clinical Findings    ECG - Afib, 109, no acute st/t changes.  Assessment & Plan    1.  Paroxysmal atrial fibrillation: Patient was noted to be in A. Fib on his March 22 visit and has since been wearing an event monitor. A. Fib burden has been anywhere between 30 and 100% since his monitor was placed. For the most part, he has been asymptomatic though intermittently experiences sluggishness or dyspnea. Only on rare occasions has he had tachypalpitations. I reviewed  Available information from his event monitor with the patient and also with Dr. [name] today.  As he appears to be paroxysmal, cardioversion does not seem to be very beneficial at this time. Dr. [name] has recommended referral to Dr. Curt Bears for consideration of ablative therapy vs  antiarrhythmic therapy.  Mr. Jaggard is interested in this route.  I will increase his lopressor to 75 mg BID.  He remains on xarelto and has been compliant.  2.  Hypertensive heart disease: Blood pressure is stable on beta blocker, ARB, and diuretic therapy.  3.  Hyperlipidemia: He remains on simvastatin therapy.LDL in June 2016 was 79.  4. Nonobstructive CAD: No recent chest pain with low risk stress test last year. He remains on beta blocker and statin therapy.  5. Hypothyroidism: He is on levothyroxine with normal TSH on March 22.  6. Disposition: Patient will follow up with A. Fib clinic this week.  Murray Hodgkins, NP 06/14/2015, 1:46 PM

## 2015-06-14 NOTE — Patient Instructions (Signed)
Erik Bayley, NP, has recommended making the following medication changes: 1. INCREASE Metoprolol tartrate to 75 mg - take 1.5 tablets by mouth daily  [name] has referred you to an electrophysiologist, Dr Curt Bears. Your physician recommends that you schedule an appointment with Dr Curt Bears within the week.  If you need a refill on your cardiac medications before your next appointment, please call your pharmacy.

## 2015-06-15 ENCOUNTER — Ambulatory Visit (INDEPENDENT_AMBULATORY_CARE_PROVIDER_SITE_OTHER): Payer: Medicare Other | Admitting: Cardiology

## 2015-06-15 ENCOUNTER — Encounter: Payer: Self-pay | Admitting: Cardiology

## 2015-06-15 ENCOUNTER — Telehealth: Payer: Self-pay

## 2015-06-15 VITALS — BP 118/86 | HR 76 | Ht 71.5 in | Wt 247.2 lb

## 2015-06-15 DIAGNOSIS — I480 Paroxysmal atrial fibrillation: Secondary | ICD-10-CM | POA: Diagnosis not present

## 2015-06-15 DIAGNOSIS — I119 Hypertensive heart disease without heart failure: Secondary | ICD-10-CM

## 2015-06-15 DIAGNOSIS — E785 Hyperlipidemia, unspecified: Secondary | ICD-10-CM | POA: Diagnosis not present

## 2015-06-15 DIAGNOSIS — G4710 Hypersomnia, unspecified: Secondary | ICD-10-CM | POA: Diagnosis not present

## 2015-06-15 DIAGNOSIS — R400 Somnolence: Secondary | ICD-10-CM

## 2015-06-15 MED ORDER — METOPROLOL TARTRATE 50 MG PO TABS: 50.0000 mg | ORAL_TABLET | Freq: Two times a day (BID) | ORAL | Status: DC

## 2015-06-15 NOTE — Telephone Encounter (Signed)
Called patient to give update about sleep study. Patient unavailable. Spoke with patients wife and let her know information given to sleep lab and they will mail patient information regarding appt date and time and if the appt date/time didn't work for them to call sleep lab and reschedule appt. Patients wife verbalized understanding and thanked me for the update.

## 2015-06-15 NOTE — Patient Instructions (Addendum)
Medication Instructions:  Your physician has recommended you make the following change in your medication: 1)  Metoprolol 50 mg twice a day  -- Your physician recommends that you start Tikosyn.  This requires a 3 day hospital stay.  Our pharmacist, Gay Filler, will be contacting you to arrange this admission --  Labwork: None ordered  Testing/Procedures: Your physician has recommended that you have a sleep study. This test records several body functions during sleep, including: brain activity, eye movement, oxygen and carbon dioxide blood levels, heart rate and rhythm, breathing rate and rhythm, the flow of air through your mouth and nose, snoring, body muscle movements, and chest and belly movement.  - office will contact you to arrange this testing.  It may be several weeks or more before you hear from our office. -  Follow-Up: To be determined after Tikosyn initiation.  If you need a refill on your cardiac medications before your next appointment, please call your pharmacy.  Thank you for choosing [facility]!!   Trinidad Curet, RN [phone]   Dofetilide capsules What is this medicine? DOFETILIDE (doe FET il ide) is an antiarrhythmic drug. It helps make your heart beat regularly. This medicine also helps to slow rapid heartbeats. This medicine may be used for other purposes; ask your health care provider or pharmacist if you have questions. What should I tell my health care provider before I take this medicine? They need to know if you have any of these conditions: -heart disease -history of low levels of potassium or magnesium -kidney disease -liver disease -an unusual or allergic reaction to dofetilide, other medicines, foods, dyes, or preservatives -pregnant or trying to get pregnant -breast-feeding How should I use this medicine? Take this medicine by mouth with a glass of water. Follow the directions on the prescription label. You can take this medicine with or without  food. Do not drink grapefruit juice with this medicine. Take your doses at regular intervals. Do not take your medicine more often than directed. Do not stop taking this medicine suddenly. This may cause serious, heart-related side effects. Your doctor will tell you how much medicine to take. If your doctor wants you to stop the medicine, the dose will be slowly lowered over time to avoid any side effects. Talk to your pediatrician regarding the use of this medicine in children. Special care may be needed. Overdosage: If you think you have taken too much of this medicine contact a poison control center or emergency room at once. NOTE: This medicine is only for you. Do not share this medicine with others. What if I miss a dose? If you miss a dose, take it as soon as you can. If it is almost time for your next dose, take only that dose. Do not take double or extra doses. What may interact with this medicine? Do not take this medicine with any of the following medications: -cimetidine -dolutegravir -hydrochlorothiazide alone or in combination with other medicines -ketoconazole -megestrol -other medicines that prolong the QT interval (cause an abnormal heart rhythm) -prochlorperazine -trimethoprim alone or in combination with sulfamethoxazole -verapamil This medicine may also interact with the following medications: -amiloride -certain antidepressants like fluvoxamine or paroxetine -certain antiviral medicines for HIV or AIDS like atazanavir or darunavir -certain medicines for fungal infections like clotrimazole or miconazole -digoxin -diltiazem -dronabinol, THC -grapefruit juice -metformin -nefazodone -triamterene -zafirlukast This list may not describe all possible interactions. Give your health care provider a list of all the medicines, herbs, non-prescription drugs, or  dietary supplements you use. Also tell them if you smoke, drink alcohol, or use illegal drugs. Some items may interact  with your medicine. What should I watch for while using this medicine? Visit your doctor or health care professional for regular checks on your progress. Wear a medical ID bracelet or chain, and carry a card that describes your disease and details of your medicine and dosage times. Check your heart rate and blood pressure regularly while you are taking this medicine. Ask your doctor or health care professional what your heart rate and blood pressure should be, and when you should contact him or her. Your doctor or health care professional also may schedule regular tests to check your progress. You will be started on this medicine in a specialized facility for at least three days. You will be monitored to find the right dose of medicine for you. It is very important that you take your medicine exactly as prescribed when you leave the hospital. The correct dosing of this medicine is very important to treat your condition and prevent possible serious side effects. What side effects may I notice from receiving this medicine? Side effects that you should report to your doctor or health care professional as soon as possible: -allergic reactions like skin rash, itching or hives, swelling of the face, lips, or tongue -breathing problems -dizziness -fast or rapid beating of the heart -feeling faint or lightheaded -swelling of the ankles -unusually weak or tired -vomiting Side effects that usually do not require medical attention (report to your doctor or health care professional if they continue or are bothersome): -cough -diarrhea -difficulty sleeping -headache -nausea -stomach pain This list may not describe all possible side effects. Call your doctor for medical advice about side effects. You may report side effects to FDA at 1-800-FDA-1088. Where should I keep my medicine? Keep out of the reach of children. Store at room temperature between 15 and 30 degrees C (59 and 86 degrees F). Protect the  medicine from moisture or humidity. Keep container tightly closed. Throw away any unused medicine after the expiration date. NOTE: This sheet is a summary. It may not cover all possible information. If you have questions about this medicine, talk to your doctor, pharmacist, or health care provider.    2016, Elsevier/[employer] Standard. (2013-10-05 16:21:18)

## 2015-06-17 ENCOUNTER — Telehealth: Payer: Self-pay | Admitting: Pharmacist

## 2015-06-17 MED ORDER — LOSARTAN POTASSIUM 100 MG PO TABS: 100.0000 mg | ORAL_TABLET | Freq: Every day | ORAL | Status: DC

## 2015-06-17 NOTE — Telephone Encounter (Signed)
Spoke with pt.  Called to set up date for [facility] admission.  He has not missed any doses of Xarelto in the past month.  He is taking losartan/HCTZ.  Will change to losartan only.  New Rx sent to pharmacy.  Pt will come to office on 4/25 for admission.

## 2015-06-20 NOTE — Telephone Encounter (Signed)
Pt started on Tikosyn and has PharmD appt on 4/25 and OV with [facility] on 5/8.

## 2015-06-21 ENCOUNTER — Ambulatory Visit (INDEPENDENT_AMBULATORY_CARE_PROVIDER_SITE_OTHER): Payer: Medicare Other | Admitting: Pharmacist

## 2015-06-21 ENCOUNTER — Inpatient Hospital Stay (HOSPITAL_COMMUNITY)
Admission: AD | Admit: 2015-06-21 | Discharge: 2015-06-24 | DRG: 310 | Disposition: A | Discharge status: Home / Self Care | Payer: Medicare Other | Source: Ambulatory Visit | Attending: Cardiology | Admitting: Cardiology

## 2015-06-21 ENCOUNTER — Encounter (HOSPITAL_COMMUNITY): Payer: Self-pay | Admitting: *Deleted

## 2015-06-21 DIAGNOSIS — E785 Hyperlipidemia, unspecified: Secondary | ICD-10-CM | POA: Diagnosis present

## 2015-06-21 DIAGNOSIS — E669 Obesity, unspecified: Secondary | ICD-10-CM | POA: Diagnosis present

## 2015-06-21 DIAGNOSIS — I480 Paroxysmal atrial fibrillation: Secondary | ICD-10-CM

## 2015-06-21 DIAGNOSIS — I119 Hypertensive heart disease without heart failure: Secondary | ICD-10-CM | POA: Diagnosis present

## 2015-06-21 DIAGNOSIS — Z809 Family history of malignant neoplasm, unspecified: Secondary | ICD-10-CM

## 2015-06-21 DIAGNOSIS — G8929 Other chronic pain: Secondary | ICD-10-CM | POA: Diagnosis present

## 2015-06-21 DIAGNOSIS — M545 Low back pain: Secondary | ICD-10-CM | POA: Diagnosis present

## 2015-06-21 DIAGNOSIS — E039 Hypothyroidism, unspecified: Secondary | ICD-10-CM | POA: Diagnosis present

## 2015-06-21 DIAGNOSIS — M109 Gout, unspecified: Secondary | ICD-10-CM | POA: Diagnosis present

## 2015-06-21 DIAGNOSIS — I2510 Atherosclerotic heart disease of native coronary artery without angina pectoris: Secondary | ICD-10-CM | POA: Diagnosis present

## 2015-06-21 DIAGNOSIS — R0683 Snoring: Secondary | ICD-10-CM

## 2015-06-21 DIAGNOSIS — Z87891 Personal history of nicotine dependence: Secondary | ICD-10-CM | POA: Diagnosis not present

## 2015-06-21 DIAGNOSIS — I481 Persistent atrial fibrillation: Secondary | ICD-10-CM

## 2015-06-21 DIAGNOSIS — Z79899 Other long term (current) drug therapy: Secondary | ICD-10-CM

## 2015-06-21 DIAGNOSIS — Z7901 Long term (current) use of anticoagulants: Secondary | ICD-10-CM

## 2015-06-21 DIAGNOSIS — Z823 Family history of stroke: Secondary | ICD-10-CM

## 2015-06-21 DIAGNOSIS — I4819 Other persistent atrial fibrillation: Secondary | ICD-10-CM

## 2015-06-21 DIAGNOSIS — Z6834 Body mass index (BMI) 34.0-34.9, adult: Secondary | ICD-10-CM

## 2015-06-21 DIAGNOSIS — Z7982 Long term (current) use of aspirin: Secondary | ICD-10-CM | POA: Diagnosis not present

## 2015-06-21 DIAGNOSIS — I4891 Unspecified atrial fibrillation: Secondary | ICD-10-CM | POA: Diagnosis not present

## 2015-06-21 DIAGNOSIS — Z833 Family history of diabetes mellitus: Secondary | ICD-10-CM | POA: Diagnosis not present

## 2015-06-21 HISTORY — DX: Other persistent atrial fibrillation: I48.19

## 2015-06-21 LAB — BASIC METABOLIC PANEL
Anion gap: 8 (ref 5–15)
BUN: 18 mg/dL (ref 6–20)
CALCIUM: 9.7 mg/dL (ref 8.9–10.3)
CO2: 27 mmol/L (ref 22–32)
CREATININE: 1.55 mg/dL — AB (ref 0.61–1.24)
Chloride: 108 mmol/L (ref 101–111)
GFR calc non Af Amer: 44 mL/min — ABNORMAL LOW (ref >60)
GFR, EST AFRICAN AMERICAN: 51 mL/min — AB (ref >60)
Glucose, Bld: 126 mg/dL — ABNORMAL HIGH (ref 65–99)
Potassium: 3.9 mmol/L (ref 3.5–5.1)
SODIUM: 143 mmol/L (ref 135–145)

## 2015-06-21 LAB — MAGNESIUM: MAGNESIUM: 1.9 mg/dL (ref 1.7–2.4)

## 2015-06-21 MED ORDER — RIVAROXABAN 20 MG PO TABS
20.0000 mg | ORAL_TABLET | Freq: Every day | ORAL | Status: DC
  Administered 2015-06-22 – 2015-06-23 (×2): 20 mg via ORAL
  Filled 2015-06-21 (×4): qty 1

## 2015-06-21 MED ORDER — IBUPROFEN 600 MG PO TABS
600.0000 mg | ORAL_TABLET | Freq: Three times a day (TID) | ORAL | Status: DC | PRN
  Administered 2015-06-21: 600 mg via ORAL
  Filled 2015-06-21: qty 1

## 2015-06-21 MED ORDER — SODIUM CHLORIDE 0.9 % IV SOLN: 250.0000 mL | INTRAVENOUS | Status: DC | PRN

## 2015-06-21 MED ORDER — DOFETILIDE 500 MCG PO CAPS
500.0000 ug | ORAL_CAPSULE | Freq: Two times a day (BID) | ORAL | Status: DC
  Administered 2015-06-21 – 2015-06-24 (×6): 500 ug via ORAL
  Filled 2015-06-21 (×6): qty 1

## 2015-06-21 MED ORDER — SIMVASTATIN 20 MG PO TABS
20.0000 mg | ORAL_TABLET | Freq: Every day | ORAL | Status: DC
  Administered 2015-06-21 – 2015-06-23 (×3): 20 mg via ORAL
  Filled 2015-06-21 (×3): qty 1

## 2015-06-21 MED ORDER — METOPROLOL TARTRATE 50 MG PO TABS
50.0000 mg | ORAL_TABLET | Freq: Two times a day (BID) | ORAL | Status: DC
  Administered 2015-06-21: 50 mg via ORAL
  Filled 2015-06-21 (×3): qty 1

## 2015-06-21 MED ORDER — SODIUM CHLORIDE 0.9% FLUSH
3.0000 mL | Freq: Two times a day (BID) | INTRAVENOUS | Status: DC
  Administered 2015-06-21 – 2015-06-23 (×4): 3 mL via INTRAVENOUS

## 2015-06-21 MED ORDER — LEVOTHYROXINE SODIUM 75 MCG PO TABS
75.0000 ug | ORAL_TABLET | Freq: Every day | ORAL | Status: DC
  Administered 2015-06-22 – 2015-06-24 (×3): 75 ug via ORAL
  Filled 2015-06-21 (×3): qty 1

## 2015-06-21 MED ORDER — LOSARTAN POTASSIUM 50 MG PO TABS
100.0000 mg | ORAL_TABLET | Freq: Every day | ORAL | Status: DC
  Administered 2015-06-22 – 2015-06-23 (×2): 100 mg via ORAL
  Filled 2015-06-21 (×2): qty 2

## 2015-06-21 MED ORDER — PNEUMOCOCCAL VAC POLYVALENT 25 MCG/0.5ML IJ INJ
0.5000 mL | INJECTION | INTRAMUSCULAR | Status: DC
  Filled 2015-06-21: qty 0.5

## 2015-06-21 MED ORDER — HYDROCORTISONE ACETATE 25 MG RE SUPP: 25.0000 mg | Freq: Every evening | RECTAL | Status: DC | PRN

## 2015-06-21 MED ORDER — POTASSIUM CHLORIDE CRYS ER 20 MEQ PO TBCR
20.0000 meq | EXTENDED_RELEASE_TABLET | Freq: Once | ORAL | Status: AC
  Administered 2015-06-21: 20 meq via ORAL
  Filled 2015-06-21: qty 1

## 2015-06-21 MED ORDER — SODIUM CHLORIDE 0.9% FLUSH: 3.0000 mL | INTRAVENOUS | Status: DC | PRN

## 2015-06-21 NOTE — Progress Notes (Signed)
Date:  06/21/2015   ID:  MITSUO EGLOFF, DOB September 03, 1945, MRN [id number]  PCP:  [name], MD  Cardiologist:  [name] Primary Electrophysiologist:  Dr. Curt Bears    Chief Complaint  Patient presents with  . Atrial Fibrillation    Tikosyn initiation     History of Present Illness: Erik [name] is a 70 y.o. male who presents today for Tikosyn initiation.  He was seen by Dr. Curt Bears on 06/15/15.  He has a history of nonobstructive coronary artery disease, hypertension, hyperlipidemia, hypothyroidism. In March 2016, he was diagnosed with atrial fibrillation with RVR in the setting of chest pain. He converted to sinus rhythm in the emergency room and subsequently underwent stress testing felt to be low risk. He complained of symptoms of atrial fibrillation, including weakness, fatigue, and shortness of breath. He did have a monitor placed that showed his A. fib burden between 30-100% per day. Given this information, it was decided to pursue normal rhythm.  Plan was to start Tikosyn.    Reviewed pt's medication list.  He was taking HCTZ but this was stopped and patient took his last dose on 4/21.  He is appropriately anticoagulated with Xarelto and reports not missing any doses within the past 4 weeks.  He is not currently taking any QTc prolongating or contraindicated medications.  We discussed potential side effects including QTc prolongation.  He is aware of the importance of compliance and will call the office if he misses more than 2 doses in a row.    EKG reviewed by Dr. Curt Bears.  Afib with RVR.  Vent rate 107.  QTc 410msec.  Per Dr. Curt Bears, ok to start Tikosyn.    Past Medical History  Diagnosis Date  . Hyperlipidemia   . Hypertensive heart disease   . Gout   . Chronic lower back pain   . Paroxysmal atrial fibrillation ([place])     a. Dx 04/2014; b. 05/2014 Echo: Ef 60-65%, no rwma, triv MR/TR, nl RV; c. CHA2DS2VASc = 3-->was on eliquis, switched to xarelto 04/2015 2/2 cost.  .  Diverticulosis   . Internal hemorrhoids   . BPH with urinary obstruction   . Nephrolithiasis   . Hypothyroidism   . Non-obstructive CAD     a. 07/2002 Cath: LM 20, LAD 44m/d, LCX 50-60m, OM1 64m, RCA 3m, EF 60%; b. 05/2014 MV: low risk w/ small sized, mild intensity rev defect in apical/inferior/infsept area, nl EF->Med Rx.  . Carotid disease, bilateral ([place])     a. 09/2014 Carotid U/S: 1-39% bilat ICA stenosis.   Past Surgical History  Procedure Laterality Date  . Lumbar disc surgery  1998    2 lumbar discs, Dr. Ellene Route   . Hand surgery Right   . Colonoscopy  04-24-05    per Dr. Henrene Pastor, clear, repeat 10 yrs     Current Outpatient Prescriptions  Medication Sig Dispense Refill  . aspirin EC 81 MG tablet Take 81 mg by mouth at bedtime.    . hydrocortisone (ANUSOL-HC) 25 MG suppository Place 1 suppository (25 mg total) rectally at bedtime as needed for hemorrhoids or itching. One suppository per rectum at night as needed. 30 suppository 6  . ibuprofen (ADVIL,MOTRIN) 200 MG tablet Take 600 mg by mouth 3 (three) times daily as needed (pain).     [name] levothyroxine (SYNTHROID, LEVOTHROID) 75 MCG tablet Take 1 tablet (75 mcg total) by mouth daily. 30 tablet 11  . losartan (COZAAR) 100 MG tablet Take 1 tablet (100 mg  total) by mouth daily. 90 tablet 3  . metoprolol (LOPRESSOR) 50 MG tablet Take 1 tablet (50 mg total) by mouth 2 (two) times daily. 180 tablet 3  . rivaroxaban (XARELTO) 20 MG TABS tablet Take 1 tablet (20 mg total) by mouth daily with supper. 30 tablet 0  . simvastatin (ZOCOR) 20 MG tablet Take 1 tablet (20 mg total) by mouth daily. 90 tablet 3   No current facility-administered medications for this visit.    Allergies:   Review of patient's allergies indicates no known allergies.   S ASSESSMENT AND PLAN:  1.  Paroxysmal atrial fibrillation: Reviewed pt's labs.  K slightly low at 3.9.  Discussed options with patient.  He would prefer to be admitted today and supplement K in the  hospital.  There is a bed available so will go ahead and send pt to hospital.     Cyndee Brightly, [facility]  06/21/2015 10:27 AM     [address] [place] [address] [phone] (office) [phone] (faHe was referred by by his primaryx)

## 2015-06-21 NOTE — Progress Notes (Addendum)
Pharmacy Review for Dofetilide (Tikosyn) Initiation  Admit Complaint: 70 y.o. male admitted 06/21/2015 with atrial fibrillation to be initiated on dofetilide.   Assessment:  Patient Exclusion Criteria: If any screening criteria checked as "Yes", then  patient  should NOT receive dofetilide until criteria item is corrected. If "Yes" please indicate correction plan.  YES  NO Patient  Exclusion Criteria Correction Plan  []  []  Baseline QTc interval is greater than or equal to 440 msec. IF above YES box checked dofetilide contraindicated unless patient has ICD; then may proceed if QTc 500-550 msec or with known ventricular conduction abnormalities may proceed with QTc 550-600 msec. QTc = 426 on 4/25   []  [x]  Magnesium level is less than 1.8 mEq/l : Last magnesium: 1.9 Lab Results  Component Value Date   MG 1.9 06/21/2015         [x]  []  Potassium level is less than 4 mEq/l : Last potassium:  3.9 Lab Results  Component Value Date   K 3.9 06/21/2015       Potassium replacement has already been ordered  []  [x]  Patient is known or suspected to have a digoxin level greater than 2 ng/ml: No results found for: DIGOXIN    []  [x]  Creatinine clearance less than 20 ml/min (calculated using Cockcroft-Gault, actual body weight and serum creatinine): -CrCl > 60   []  [x]  Patient has received drugs known to prolong the QT intervals within the last 48 hours (phenothiazines, tricyclics or tetracyclic antidepressants, erythromycin, H-1 antihistamines, cisapride, fluoroquinolones, azithromycin). Drugs not listed above may have an, as yet, undetected potential to prolong the QT interval, updated information on QT prolonging agents is available at this website:QT prolonging agents   []  [x]  Patient received a dose of hydrochlorothiazide (Oretic) alone or in any combination including triamterene (Dyazide, Maxzide) in the last 48 hours.   []  [x]  Patient received a medication known to increase dofetilide plasma  concentrations prior to initial dofetilide dose:  . Trimethoprim (Primsol, Proloprim) in the last 36 hours . Verapamil (Calan, Verelan) in the last 36 hours or a sustained release dose in the last 72 hours . Megestrol (Megace) in the last 5 days  . Cimetidine (Tagamet) in the last 6 hours . Ketoconazole (Nizoral) in the last 24 hours . Itraconazole (Sporanox) in the last 48 hours  . Prochlorperazine (Compazine) in the last 36 hours    []  [x]  Patient is known to have a history of torsades de pointes; congenital or acquired long QT syndromes.   []  [x]  Patient has received a Class 1 antiarrhythmic with less than 2 half-lives since last dose. (Disopyramide, Quinidine, Procainamide, Lidocaine, Mexiletine, Flecainide, Propafenone)   []  [x]  Patient has received amiodarone therapy in the past 3 months or amiodarone level is greater than 0.3 ng/ml.    Patient has been appropriately anticoagulated with Xarelto.  Ordering provider was confirmed at [email or web address] if they are not listed on the [place] Prescribers list.  Goal of Therapy: Follow renal function, electrolytes, potential drug interactions, and dose adjustment. Provide education and 1 week supply at discharge.  Plan:  [x]   Physician selected initial dose within range recommended for patients level of renal function - will monitor for response.  []   Physician selected initial dose outside of range recommended for patients level of renal function - will discuss if the dose should be altered at this time.   Select One Calculated CrCl  Dose q12h  [x]  > 60 ml/min 500 mcg  []  40-60 ml/min 250 mcg  []   20-40 ml/min 125 mcg   2. Follow up QTc after the first 5 doses, renal function, electrolytes (K & Mg) daily x 3     days, dose adjustment, success of initiation and facilitate 1 week discharge supply as     clinically indicated.  3. Initiate Tikosyn education video (Call [phone] and ask for video # [phone]).  4. Place Enrollment Form  on the chart for discharge supply of dofetilide.  Hildred Laser, Pharm D 06/21/2015 3:11 PM

## 2015-06-21 NOTE — H&P (Signed)
ELECTROPHYSIOLOGY CONSULT NOTE    Patient ID: Erik [name] MRN: [id number], DOB/AGE: October 31, 1945 70 y.o.  Admit date: 06/21/2015 Date of Consult: 06/21/2015  Primary Physician: [name], MD Primary Cardiologist: [name] Electrophysiologist: Curt Bears  CC: here for Tikosyn load  HPI:  Erik [name] is a 70 y.o. male with a past medical history significant for paroxysmal atrial fibrillation, hypertensive heart disease, hyperlipidemia, hypothyroidism.  He was first diagnosed with atrial fibrillation in March of 2016.  He converted to SR spontaneously at that time but has had recurrent persistent atrial fibrillation with associated dyspnea on exertion and fatigue.  Risks, benefits to Tikosyn were reviewed with the patient who wished to proceed.   Echo 05/2014 demonstrated EF 60-65%, no RWMA, moderate LVH, LA 39.  Sleep study is pending.   He currently states he has persistent dyspnea on exertion and fatigue.  He denies chest pain, recent fevers, chills, nausea, vomiting, syncope.   Past Medical History  Diagnosis Date  . Hyperlipidemia   . Hypertensive heart disease   . Gout   . Chronic lower back pain   . Paroxysmal atrial fibrillation ([place])     a. Dx 04/2014; b. 05/2014 Echo: Ef 60-65%, no rwma, triv MR/TR, nl RV; c. CHA2DS2VASc = 3-->was on eliquis, switched to xarelto 04/2015 2/2 cost.  . Diverticulosis   . Internal hemorrhoids   . BPH with urinary obstruction   . Nephrolithiasis   . Hypothyroidism   . Non-obstructive CAD     a. 07/2002 Cath: LM 20, LAD 10m/d, LCX 50-30m, OM1 62m, RCA 76m, EF 60%; b. 05/2014 MV: low risk w/ small sized, mild intensity rev defect in apical/inferior/infsept area, nl EF->Med Rx.  . Carotid disease, bilateral ([place])     a. 09/2014 Carotid U/S: 1-39% bilat ICA stenosis.     Surgical History:  Past Surgical History  Procedure Laterality Date  . Lumbar disc surgery  1998    2 lumbar discs, Dr. Ellene Route   . Hand surgery Right   . Colonoscopy   04-24-05    per Dr. Henrene Pastor, clear, repeat 10 yrs     Prescriptions prior to admission  Medication Sig Dispense Refill Last Dose  . aspirin EC 81 MG tablet Take 81 mg by mouth at bedtime.   Taking  . hydrocortisone (ANUSOL-HC) 25 MG suppository Place 1 suppository (25 mg total) rectally at bedtime as needed for hemorrhoids or itching. One suppository per rectum at night as needed. 30 suppository 6 Taking  . ibuprofen (ADVIL,MOTRIN) 200 MG tablet Take 600 mg by mouth 3 (three) times daily as needed (pain).    Taking  . levothyroxine (SYNTHROID, LEVOTHROID) 75 MCG tablet Take 1 tablet (75 mcg total) by mouth daily. 30 tablet 11 Taking  . losartan (COZAAR) 100 MG tablet Take 1 tablet (100 mg total) by mouth daily. 90 tablet 3   . metoprolol (LOPRESSOR) 50 MG tablet Take 1 tablet (50 mg total) by mouth 2 (two) times daily. 180 tablet 3   . rivaroxaban (XARELTO) 20 MG TABS tablet Take 1 tablet (20 mg total) by mouth daily with supper. 30 tablet 0 Taking  . simvastatin (ZOCOR) 20 MG tablet Take 1 tablet (20 mg total) by mouth daily. 90 tablet 3 Taking    Inpatient Medications:   Allergies: No Known Allergies  Social History   Social History  . Marital Status: Married    Spouse Name: N/A  . Number of Children: 1  . Years of Education: N/A   Occupational  History  . retired    Social History Main Topics  . Smoking status: Former Smoker    Types: Cigarettes    Quit date: 02/27/1995  . Smokeless tobacco: Never Used  . Alcohol Use: 0.0 oz/week    0 Standard drinks or equivalent per week     Comment: couple times a month  . Drug Use: No  . Sexual Activity: Not on file   Other Topics Concern  . Not on file   Social History Narrative     Family History  Problem Relation Age of Onset  . Dementia Mother   . Stroke Father   . Diabetes Paternal Grandmother   . Stroke Paternal Aunt     x 2  . Cancer Paternal Aunt     type unknown     Review of Systems: All other systems reviewed  and are otherwise negative except as noted above.  Physical Exam: Filed Vitals:   06/21/15 1402  BP: 126/74  Pulse: 96  Temp: 97.8 F (36.6 C)  TempSrc: Oral  Height: 5\' 11"  (1.803 m)  Weight: 248 lb (112.492 kg)    GEN- The patient is obese appearing, alert and oriented x 3 today.   HEENT: normocephalic, atraumatic; sclera clear, conjunctiva pink; hearing intact; oropharynx clear; neck supple  Lungs- Clear to ausculation bilaterally, normal work of breathing.  No wheezes, rales, rhonchi Heart- Tachycardic irregular rate and rhythm  GI- soft, non-tender, non-distended, bowel sounds present, no hepatosplenomegaly Extremities- no clubbing, cyanosis, or edema; DP/PT/radial pulses 2+ bilaterally MS- no significant deformity or atrophy Skin- warm and dry, no rash or lesion Psych- euthymic mood, full affect Neuro- strength and sensation are intact  Labs:   Lab Results  Component Value Date   WBC 5.9 08/25/2014   HGB 16.0 08/25/2014   HCT 46.6 08/25/2014   MCV 86.6 08/25/2014   PLT 225.0 08/25/2014    Recent Labs Lab 06/21/15 1115  NA 143  K 3.9  CL 108  CO2 27  BUN 18  CREATININE 1.55*  CALCIUM 9.7  GLUCOSE 126*      Radiology/Studies: No results found.  TELEMETRY: atrial fibrillation with RVR  Assessment/Plan: 1.  Paroxysmal atrial fibrillation The patient has paroxysmal symptomatic atrial fibrillation and presents for Tikosyn admission. QTc stable by Dr Curt Bears in office today Keep K >3.9, Mg >1.8 (ok to start Tikosyn tonight with K of 3.9) CrCl 72 using actual weight, will start Tikosyn 544mcg twice daily Will need DCCV on Thursday if still in AF Continue Xarelto for CHADS2VASC of 3 (he reports compliance for the last 4 weeks without interruption)  2.  Obesity Body mass index is 34.6 kg/(m^2). Weight loss encouraged Discussed with patient today data supporting lifestyle modification Will refer to AF nutrition program at discharge  3.  Snoring Sleep  study pending Discussed correlation between OSA and AF today   Signed, Chanetta Marshall, NP 06/21/2015 2:30 PM  I have seen, examined the patient, and reviewed the above assessment and plan.  On exam, iRRR. Changes to above are made where necessary.  Reports compliance with xarelto without interruption.  Will initiate tikosyn at 500 mcg BID and follow QT closely.  Co Sign: Thompson Grayer, MD 06/21/2015 3:52 PM

## 2015-06-22 ENCOUNTER — Other Ambulatory Visit: Payer: Self-pay | Admitting: Cardiovascular Disease

## 2015-06-22 LAB — BASIC METABOLIC PANEL
Anion gap: 8 (ref 5–15)
BUN: 18 mg/dL (ref 6–20)
CALCIUM: 9.1 mg/dL (ref 8.9–10.3)
CHLORIDE: 110 mmol/L (ref 101–111)
CO2: 26 mmol/L (ref 22–32)
CREATININE: 1.53 mg/dL — AB (ref 0.61–1.24)
GFR calc non Af Amer: 45 mL/min — ABNORMAL LOW (ref >60)
GFR, EST AFRICAN AMERICAN: 52 mL/min — AB (ref >60)
GLUCOSE: 117 mg/dL — AB (ref 65–99)
Potassium: 4.1 mmol/L (ref 3.5–5.1)
Sodium: 144 mmol/L (ref 135–145)

## 2015-06-22 LAB — MAGNESIUM: Magnesium: 2 mg/dL (ref 1.7–2.4)

## 2015-06-22 NOTE — Progress Notes (Signed)
SUBJECTIVE: The patient is doing well today.  At this time, he denies chest pain, shortness of breath, or any new concerns.  . dofetilide  500 mcg Oral BID  . levothyroxine  75 mcg Oral QAC breakfast  . losartan  100 mg Oral Daily  . metoprolol  50 mg Oral BID  . pneumococcal 23 valent vaccine  0.5 mL Intramuscular Tomorrow-1000  . rivaroxaban  20 mg Oral Q supper  . simvastatin  20 mg Oral QHS  . sodium chloride flush  3 mL Intravenous Q12H      OBJECTIVE: Physical Exam: Filed Vitals:   06/21/15 1402 06/21/15 2127 06/22/15 0300 06/22/15 0321  BP: 126/74 136/78  120/94  Pulse: 96 102  62  Temp: 97.8 F (36.6 C) 98.2 F (36.8 C)  97.5 F (36.4 C)  TempSrc: Oral Oral  Oral  Resp: 16   18  Height: 5\' 11"  (1.803 m)     Weight: 248 lb (112.492 kg)  248 lb (112.492 kg)   SpO2: 97% 96%  100%    Intake/Output Summary (Last 24 hours) at 06/22/15 0919 Last data filed at 06/22/15 0847  Gross per 24 hour  Intake    720 ml  Output    400 ml  Net    320 ml    Telemetry reveals sinus rhythm  GEN- The patient is well appearing, alert and oriented x 3 today.   Head- normocephalic, atraumatic Eyes-  Sclera clear, conjunctiva pink Ears- hearing intact Oropharynx- clear Neck- supple, no JVP Lymph- no cervical lymphadenopathy Lungs- Clear to ausculation bilaterally, normal work of breathing Heart- Regular rate and rhythm, no murmurs, rubs or gallops, PMI not laterally displaced GI- soft, NT, ND, + BS Extremities- no clubbing, cyanosis, or edema Skin- no rash or lesion Psych- euthymic mood, full affect Neuro- strength and sensation are intact  LABS: Basic Metabolic Panel:  Recent Labs  06/21/15 1115 06/22/15 0414  NA 143 144  K 3.9 4.1  CL 108 110  CO2 27 26  GLUCOSE 126* 117*  BUN 18 18  CREATININE 1.55* 1.53*  CALCIUM 9.7 9.1  MG 1.9 2.0   Liver Function Tests: No results for input(s): AST, ALT, ALKPHOS, BILITOT, PROT, ALBUMIN in the last 72 hours. No  results for input(s): LIPASE, AMYLASE in the last 72 hours. CBC: No results for input(s): WBC, NEUTROABS, HGB, HCT, MCV, PLT in the last 72 hours. Cardiac Enzymes: No results for input(s): CKTOTAL, CKMB, CKMBINDEX, TROPONINI in the last 72 hours. BNP: Invalid input(s): POCBNP D-Dimer: No results for input(s): DDIMER in the last 72 hours. Hemoglobin A1C: No results for input(s): HGBA1C in the last 72 hours. Fasting Lipid Panel: No results for input(s): CHOL, HDL, LDLCALC, TRIG, CHOLHDL, LDLDIRECT in the last 72 hours. Thyroid Function Tests: No results for input(s): TSH, T4TOTAL, T3FREE, THYROIDAB in the last 72 hours.  Invalid input(s): FREET3 Anemia Panel: No results for input(s): VITAMINB12, FOLATE, FERRITIN, TIBC, IRON, RETICCTPCT in the last 72 hours.  RADIOLOGY: No results found.  ASSESSMENT AND PLAN:  Active Problems:   Persistent atrial fibrillation ([place]) 1. Paroxysmal atrial fibrillation The patient has paroxysmal symptomatic atrial fibrillation and presents for Tikosyn admission. Keep K >3.9, Mg >1.8 (ok to start Tikosyn tonight with K of 3.9) CrCl 72 using actual weight, on Tikosyn 539mcg twice daily Converted to sinus rhythm.  QTc stable. Continue Xarelto for CHADS2VASC of 3 (he reports compliance for the last 4 weeks without interruption)  2. Obesity Body mass index is  34.6 kg/(m^2). Weight loss encouraged Discussed with patient today data supporting lifestyle modification [place] refer to AF nutrition program at discharge  3. Snoring Sleep study pending   Kaisey Huseby [name], MD 06/22/2015 9:19 AM

## 2015-06-22 NOTE — Care Management Note (Addendum)
Case Management Note  Patient Details  Name: Erik [name] MRN: [id number] Date of Birth: [birth year]  Subjective/Objective:    Pt admitted for Persistent A Fib- Tikosyn Load. Pt is from home with family support. Plan will be to retun home at d/c.               Action/Plan: CM did a benefits check for Tikosyn and cost will be: $192.72- for the generic dofetilide- brand is not covered- prior [name] is not required. CM will make pt aware of cost. CM did call [facility] [employer] [facility] [address] [facility] and medication can be ordered. CM will assist with the 7 day supply to be filled via [place]. Pt will need Rx for 7 day supply no refills and the original Rx with refills. No further needs from CM at this time.   Expected Discharge Date:                  Expected Discharge Plan:  Home/Self Care  In-House Referral:  NA  Discharge planning Services  CM Consult, Medication Assistance  Post Acute Care Choice:  NA Choice offered to:  NA  DME Arranged:  N/A DME Agency:  NA  HH Arranged:  NA HH Agency:  NA  Status of Service:  Completed, signed off  Medicare Important Message Given:    Date Medicare IM Given:    Medicare IM give by:    Date Additional Medicare IM Given:    Additional Medicare Important Message give by:     If discussed at [place] of Stay Meetings, dates discussed:    Additional Comments: 1225 06-22-15 Jacqlyn Krauss, RN,BSN [phone] CM did speak with pt and wife in regards to Tikosyn and he will be able to afford the generic Tikosyn at 192.72. [facility] [address] [facility] No further needs from CM at this time.   Bethena Roys, RN 06/22/2015, 11:55 AM

## 2015-06-22 NOTE — Telephone Encounter (Signed)
Pt seen in [place] on 4/18 to review monitor results.

## 2015-06-22 NOTE — Progress Notes (Signed)
Patient converted to Sinus Brady/NSR with HR 55-65 at 0111. EKG obtained, VSS. Will continue to monitor.

## 2015-06-23 DIAGNOSIS — I480 Paroxysmal atrial fibrillation: Principal | ICD-10-CM

## 2015-06-23 LAB — BASIC METABOLIC PANEL
ANION GAP: 9 (ref 5–15)
BUN: 19 mg/dL (ref 6–20)
CALCIUM: 9 mg/dL (ref 8.9–10.3)
CO2: 28 mmol/L (ref 22–32)
CREATININE: 1.37 mg/dL — AB (ref 0.61–1.24)
Chloride: 108 mmol/L (ref 101–111)
GFR calc non Af Amer: 51 mL/min — ABNORMAL LOW (ref >60)
GFR, EST AFRICAN AMERICAN: 60 mL/min — AB (ref >60)
Glucose, Bld: 103 mg/dL — ABNORMAL HIGH (ref 65–99)
Potassium: 4.3 mmol/L (ref 3.5–5.1)
SODIUM: 145 mmol/L (ref 135–145)

## 2015-06-23 LAB — MAGNESIUM: Magnesium: 2.2 mg/dL (ref 1.7–2.4)

## 2015-06-23 NOTE — Progress Notes (Signed)
SUBJECTIVE: The patient is doing well today.  At this time, he denies chest pain or any new concerns.  Continues to have same SOB that he has had for the past year.  . dofetilide  500 mcg Oral BID  . levothyroxine  75 mcg Oral QAC breakfast  . losartan  100 mg Oral Daily  . metoprolol  50 mg Oral BID  . pneumococcal 23 valent vaccine  0.5 mL Intramuscular Tomorrow-1000  . rivaroxaban  20 mg Oral Q supper  . simvastatin  20 mg Oral QHS  . sodium chloride flush  3 mL Intravenous Q12H      OBJECTIVE: Physical Exam: Filed Vitals:   06/22/15 1322 06/22/15 1954 06/22/15 2200 06/23/15 0500  BP: 126/84 141/72  128/58  Pulse: 79 60 52 53  Temp: 98 F (36.7 C) 97.8 F (36.6 C)  97.6 F (36.4 C)  TempSrc: Oral Oral  Oral  Resp: 18 17    Height:      Weight:    249 lb (112.946 kg)  SpO2: 100% 99%  97%    Intake/Output Summary (Last 24 hours) at 06/23/15 0745 Last data filed at 06/22/15 2219  Gross per 24 hour  Intake    843 ml  Output      0 ml  Net    843 ml    Telemetry reveals sinus rhythm  GEN- The patient is well appearing, alert and oriented x 3 today.   Head- normocephalic, atraumatic Eyes-  Sclera clear, conjunctiva pink Ears- hearing intact Oropharynx- clear Neck- supple, no JVP Lymph- no cervical lymphadenopathy Lungs- Clear to ausculation bilaterally, normal work of breathing Heart- Regular rate and rhythm, no murmurs, rubs or gallops, PMI not laterally displaced GI- soft, NT, ND, + BS Extremities- no clubbing, cyanosis, or edema Skin- no rash or lesion Psych- euthymic mood, full affect Neuro- strength and sensation are intact  LABS: Basic Metabolic Panel:  Recent Labs  06/22/15 0414 06/23/15 0451  NA 144 145  K 4.1 4.3  CL 110 108  CO2 26 28  GLUCOSE 117* 103*  BUN 18 19  CREATININE 1.53* 1.37*  CALCIUM 9.1 9.0  MG 2.0 2.2   Liver Function Tests: No results for input(s): AST, ALT, ALKPHOS, BILITOT, PROT, ALBUMIN in the last 72 hours. No  results for input(s): LIPASE, AMYLASE in the last 72 hours. CBC: No results for input(s): WBC, NEUTROABS, HGB, HCT, MCV, PLT in the last 72 hours. Cardiac Enzymes: No results for input(s): CKTOTAL, CKMB, CKMBINDEX, TROPONINI in the last 72 hours. BNP: Invalid input(s): POCBNP D-Dimer: No results for input(s): DDIMER in the last 72 hours. Hemoglobin A1C: No results for input(s): HGBA1C in the last 72 hours. Fasting Lipid Panel: No results for input(s): CHOL, HDL, LDLCALC, TRIG, CHOLHDL, LDLDIRECT in the last 72 hours. Thyroid Function Tests: No results for input(s): TSH, T4TOTAL, T3FREE, THYROIDAB in the last 72 hours.  Invalid input(s): FREET3 Anemia Panel: No results for input(s): VITAMINB12, FOLATE, FERRITIN, TIBC, IRON, RETICCTPCT in the last 72 hours.  RADIOLOGY: No results found.  ASSESSMENT AND PLAN:  Active Problems:   Persistent atrial fibrillation ([place]) 1. Paroxysmal atrial fibrillation Keep K >3.9, Mg >1.8 (ok to start Tikosyn tonight with K of 3.9) CrCl 72 using actual weight, on Tikosyn 581mcg twice daily Converted to sinus rhythm.  QTc stable at 467. Continue Xarelto for CHADS2VASC of 3   2. Obesity Body mass index is 34.6 kg/(m^2). Weight loss encouraged Discussed with patient today data supporting lifestyle  modification Charmain Diosdado refer to AF nutrition program at discharge  3. Snoring Sleep study pending   Wane Mollett [name], MD 06/23/2015 7:45 AM

## 2015-06-23 NOTE — Care Management Important Message (Signed)
Important Message  Patient Details  Name: Erik [name] MRN: [id number] Date of Birth: 1945-03-01   Medicare Important Message Given:  Yes    Nathen May 06/23/2015, 1:48 PM

## 2015-06-23 NOTE — Discharge Summary (Signed)
ELECTROPHYSIOLOGY PROCEDURE DISCHARGE SUMMARY    Patient ID: Erik [name],  MRN: [id number], DOB/AGE: Sep 11, 1945 70 y.o.  Admit date: 06/21/2015 Discharge date: 06/24/2015  Primary Care Physician: [name], MD Primary Cardiologist: [name] Electrophysiologist: Curt Bears  Primary Discharge Diagnosis:  1.  Paroxysmal atrial fibrillation status post Tikosyn loading this admission  Secondary Discharge Diagnosis:  1.  Hyperlipidemia 2.  Hypertensin 3.  BPH 4.  Non obstructive CAD 5.  Carotid artery disease  No Known Allergies   Procedures This Admission:  1.  Tikosyn loading  Brief HPI: Erik [name] is a 70 y.o. male with a past medical history as noted above.  They were referred to EP in the outpatient setting for treatment options of atrial fibrillation.  Risks, benefits, and alternatives to Tikosyn were reviewed with the patient who wished to proceed.    Hospital Course:  The patient was admitted and Tikosyn was initiated.  Renal function and electrolytes were followed during the hospitalization.  Their QTc remained stable. He converted to SR spontaneously. They were monitored until discharge on telemetry which demonstrated sinus rhythm.  On the day of discharge, they were examined by Dr Curt Bears who considered them stable for discharge to home.  Follow-up has been arranged with AF clinic in 1 week and with Dr Curt Bears in 4 weeks.   Physical Exam: Filed Vitals:   06/23/15 0500 06/23/15 1928 06/23/15 2146 06/24/15 0330  BP: 128/58 129/68  124/58  Pulse: 53 59 59 51  Temp: 97.6 F (36.4 C) 98 F (36.7 C)  97.8 F (36.6 C)  TempSrc: Oral Oral  Oral  Resp:  17  18  Height:      Weight: 249 lb (112.946 kg)   247 lb 1.6 oz (112.084 kg)  SpO2: 97% 95%  96%    Labs:   Lab Results  Component Value Date   WBC 5.9 08/25/2014   HGB 16.0 08/25/2014   HCT 46.6 08/25/2014   MCV 86.6 08/25/2014   PLT 225.0 08/25/2014     Recent Labs Lab 06/24/15 0520  NA 143  K  4.2  CL 107  CO2 27  BUN 23*  CREATININE 1.34*  CALCIUM 8.9  GLUCOSE 103*     Discharge Medications:    Medication List    TAKE these medications        dofetilide 500 MCG capsule  Commonly known as:  TIKOSYN  Take 1 capsule (500 mcg total) by mouth 2 (two) times daily.     hydrocortisone 25 MG suppository  Commonly known as:  ANUSOL-HC  Place 1 suppository (25 mg total) rectally at bedtime as needed for hemorrhoids or itching. One suppository per rectum at night as needed.     ibuprofen 200 MG tablet  Commonly known as:  ADVIL,MOTRIN  Take 600 mg by mouth 3 (three) times daily as needed (pain).     levothyroxine 75 MCG tablet  Commonly known as:  SYNTHROID, LEVOTHROID  Take 1 tablet (75 mcg total) by mouth daily.     losartan 100 MG tablet  Commonly known as:  COZAAR  Take 1 tablet (100 mg total) by mouth daily.     metoprolol 50 MG tablet  Commonly known as:  LOPRESSOR  Take 1 tablet (50 mg total) by mouth 2 (two) times daily.     simvastatin 20 MG tablet  Commonly known as:  ZOCOR  Take 1 tablet (20 mg total) by mouth daily.     XARELTO 20 MG  Tabs tablet  Generic drug:  rivaroxaban  TAKE 1 TABLET (20 MG TOTAL) BY MOUTH DAILY WITH SUPPER.        Disposition:  Discharge Instructions    Diet - low sodium heart healthy    Complete by:  As directed      Increase activity slowly    Complete by:  As directed           Follow-up Information    Follow up with [facility] On 07/01/2015.   Specialty:  Cardiology   Why:  at [facility] information:   [address] [id number] [place] [phone]      Follow up with Teagan Ozawa [name], MD On 07/26/2015.   Specialty:  Cardiology   Why:  at 11:15AM    Contact information:   [place] [address] [phone]       Duration of Discharge Encounter: Greater than 30 minutes including physician time.  Signed, Chanetta Marshall, NP 06/24/2015 8:02 AM     I have seen and examined this patient with Chanetta Marshall.  Agree with above, note added to reflect my findings.  On exam, regular rhythm, no murmurs, lungs clear.  Had admission for tikosyn loading.  Converted to sinus rhythm after first dose of tikosyn.  QTc stable.  Plan for discharge today after final dose of Tikosyn with follow up in clinic.    Kaelum Kissick M. Jonnelle Lawniczak MD 06/24/2015 10:18 AM

## 2015-06-24 LAB — BASIC METABOLIC PANEL
Anion gap: 9 (ref 5–15)
BUN: 23 mg/dL — AB (ref 6–20)
CALCIUM: 8.9 mg/dL (ref 8.9–10.3)
CO2: 27 mmol/L (ref 22–32)
CREATININE: 1.34 mg/dL — AB (ref 0.61–1.24)
Chloride: 107 mmol/L (ref 101–111)
GFR calc Af Amer: >60 mL/min (ref >60)
GFR, EST NON AFRICAN AMERICAN: 53 mL/min — AB (ref >60)
Glucose, Bld: 103 mg/dL — ABNORMAL HIGH (ref 65–99)
Potassium: 4.2 mmol/L (ref 3.5–5.1)
SODIUM: 143 mmol/L (ref 135–145)

## 2015-06-24 LAB — MAGNESIUM: MAGNESIUM: 2 mg/dL (ref 1.7–2.4)

## 2015-06-24 MED ORDER — DOFETILIDE 500 MCG PO CAPS: 500.0000 ug | ORAL_CAPSULE | Freq: Two times a day (BID) | ORAL | Status: DC

## 2015-06-26 ENCOUNTER — Emergency Department (HOSPITAL_COMMUNITY): Payer: Medicare Other

## 2015-06-26 ENCOUNTER — Inpatient Hospital Stay (HOSPITAL_COMMUNITY)
Admission: EM | Admit: 2015-06-26 | Discharge: 2015-06-28 | DRG: 243 | Disposition: A | Discharge status: Home / Self Care | Payer: Medicare Other | Attending: Internal Medicine | Admitting: Internal Medicine

## 2015-06-26 ENCOUNTER — Encounter (HOSPITAL_COMMUNITY): Payer: Self-pay | Admitting: Internal Medicine

## 2015-06-26 DIAGNOSIS — R404 Transient alteration of awareness: Secondary | ICD-10-CM | POA: Diagnosis not present

## 2015-06-26 DIAGNOSIS — Z5181 Encounter for therapeutic drug level monitoring: Secondary | ICD-10-CM

## 2015-06-26 DIAGNOSIS — I495 Sick sinus syndrome: Principal | ICD-10-CM | POA: Diagnosis present

## 2015-06-26 DIAGNOSIS — R55 Syncope and collapse: Secondary | ICD-10-CM

## 2015-06-26 DIAGNOSIS — Z950 Presence of cardiac pacemaker: Secondary | ICD-10-CM

## 2015-06-26 DIAGNOSIS — E039 Hypothyroidism, unspecified: Secondary | ICD-10-CM | POA: Diagnosis present

## 2015-06-26 DIAGNOSIS — Z7901 Long term (current) use of anticoagulants: Secondary | ICD-10-CM

## 2015-06-26 DIAGNOSIS — R002 Palpitations: Secondary | ICD-10-CM | POA: Diagnosis not present

## 2015-06-26 DIAGNOSIS — G8929 Other chronic pain: Secondary | ICD-10-CM | POA: Diagnosis present

## 2015-06-26 DIAGNOSIS — N138 Other obstructive and reflux uropathy: Secondary | ICD-10-CM | POA: Diagnosis not present

## 2015-06-26 DIAGNOSIS — I119 Hypertensive heart disease without heart failure: Secondary | ICD-10-CM | POA: Diagnosis not present

## 2015-06-26 DIAGNOSIS — E785 Hyperlipidemia, unspecified: Secondary | ICD-10-CM | POA: Diagnosis not present

## 2015-06-26 DIAGNOSIS — R001 Bradycardia, unspecified: Secondary | ICD-10-CM | POA: Diagnosis not present

## 2015-06-26 DIAGNOSIS — R531 Weakness: Secondary | ICD-10-CM | POA: Diagnosis not present

## 2015-06-26 DIAGNOSIS — R9431 Abnormal electrocardiogram [ECG] [EKG]: Secondary | ICD-10-CM | POA: Diagnosis present

## 2015-06-26 DIAGNOSIS — N401 Enlarged prostate with lower urinary tract symptoms: Secondary | ICD-10-CM | POA: Diagnosis present

## 2015-06-26 DIAGNOSIS — I4581 Long QT syndrome: Secondary | ICD-10-CM | POA: Diagnosis not present

## 2015-06-26 DIAGNOSIS — R0789 Other chest pain: Secondary | ICD-10-CM | POA: Diagnosis present

## 2015-06-26 DIAGNOSIS — R079 Chest pain, unspecified: Secondary | ICD-10-CM | POA: Diagnosis not present

## 2015-06-26 DIAGNOSIS — I10 Essential (primary) hypertension: Secondary | ICD-10-CM | POA: Diagnosis present

## 2015-06-26 DIAGNOSIS — R42 Dizziness and giddiness: Secondary | ICD-10-CM | POA: Diagnosis not present

## 2015-06-26 DIAGNOSIS — M545 Low back pain: Secondary | ICD-10-CM | POA: Diagnosis present

## 2015-06-26 DIAGNOSIS — Z79899 Other long term (current) drug therapy: Secondary | ICD-10-CM

## 2015-06-26 DIAGNOSIS — Z87891 Personal history of nicotine dependence: Secondary | ICD-10-CM

## 2015-06-26 DIAGNOSIS — M109 Gout, unspecified: Secondary | ICD-10-CM | POA: Diagnosis present

## 2015-06-26 DIAGNOSIS — I2510 Atherosclerotic heart disease of native coronary artery without angina pectoris: Secondary | ICD-10-CM | POA: Diagnosis present

## 2015-06-26 HISTORY — DX: Abnormal electrocardiogram (ECG) (EKG): R94.31

## 2015-06-26 HISTORY — DX: Other chest pain: R07.89

## 2015-06-26 LAB — BASIC METABOLIC PANEL
ANION GAP: 9 (ref 5–15)
BUN: 20 mg/dL (ref 6–20)
CALCIUM: 9.1 mg/dL (ref 8.9–10.3)
CO2: 25 mmol/L (ref 22–32)
Chloride: 110 mmol/L (ref 101–111)
Creatinine, Ser: 1.37 mg/dL — ABNORMAL HIGH (ref 0.61–1.24)
GFR, EST AFRICAN AMERICAN: 60 mL/min — AB (ref >60)
GFR, EST NON AFRICAN AMERICAN: 51 mL/min — AB (ref >60)
GLUCOSE: 99 mg/dL (ref 65–99)
POTASSIUM: 4.8 mmol/L (ref 3.5–5.1)
SODIUM: 144 mmol/L (ref 135–145)

## 2015-06-26 LAB — CBC WITH DIFFERENTIAL/PLATELET
BASOS ABS: 0 10*3/uL (ref 0.0–0.1)
BASOS PCT: 0 %
EOS ABS: 0.3 10*3/uL (ref 0.0–0.7)
EOS PCT: 6 %
HCT: 40.5 % (ref 39.0–52.0)
Hemoglobin: 13.7 g/dL (ref 13.0–17.0)
Lymphocytes Relative: 23 %
Lymphs Abs: 1.3 10*3/uL (ref 0.7–4.0)
MCH: 30.3 pg (ref 26.0–34.0)
MCHC: 33.8 g/dL (ref 30.0–36.0)
MCV: 89.6 fL (ref 78.0–100.0)
MONO ABS: 0.6 10*3/uL (ref 0.1–1.0)
Monocytes Relative: 10 %
Neutro Abs: 3.6 10*3/uL (ref 1.7–7.7)
Neutrophils Relative %: 61 %
PLATELETS: 168 10*3/uL (ref 150–400)
RBC: 4.52 MIL/uL (ref 4.22–5.81)
RDW: 12.9 % (ref 11.5–15.5)
WBC: 5.8 10*3/uL (ref 4.0–10.5)

## 2015-06-26 LAB — URINALYSIS, ROUTINE W REFLEX MICROSCOPIC
BILIRUBIN URINE: NEGATIVE
GLUCOSE, UA: NEGATIVE mg/dL
HGB URINE DIPSTICK: NEGATIVE
Ketones, ur: NEGATIVE mg/dL
LEUKOCYTES UA: NEGATIVE
NITRITE: NEGATIVE
PH: 5.5 (ref 5.0–8.0)
Protein, ur: NEGATIVE mg/dL
Specific Gravity, Urine: 1.027 (ref 1.005–1.030)

## 2015-06-26 LAB — CBG MONITORING, ED: GLUCOSE-CAPILLARY: 90 mg/dL (ref 65–99)

## 2015-06-26 LAB — I-STAT TROPONIN, ED: TROPONIN I, POC: 0 ng/mL (ref 0.00–0.08)

## 2015-06-26 MED ORDER — SODIUM CHLORIDE 0.9% FLUSH: 3.0000 mL | INTRAVENOUS | Status: DC | PRN

## 2015-06-26 MED ORDER — DILTIAZEM HCL 100 MG IV SOLR
10.0000 mg/h | INTRAVENOUS | Status: DC
  Administered 2015-06-26: 10 mg/h via INTRAVENOUS

## 2015-06-26 MED ORDER — SIMVASTATIN 20 MG PO TABS
20.0000 mg | ORAL_TABLET | Freq: Every day | ORAL | Status: DC
  Administered 2015-06-26 – 2015-06-27 (×2): 20 mg via ORAL
  Filled 2015-06-26 (×2): qty 1

## 2015-06-26 MED ORDER — DILTIAZEM LOAD VIA INFUSION
15.0000 mg | Freq: Once | INTRAVENOUS | Status: AC
  Administered 2015-06-26: 15 mg via INTRAVENOUS
  Filled 2015-06-26: qty 15

## 2015-06-26 MED ORDER — SODIUM CHLORIDE 0.9% FLUSH
3.0000 mL | Freq: Two times a day (BID) | INTRAVENOUS | Status: DC
  Administered 2015-06-26 – 2015-06-27 (×2): 3 mL via INTRAVENOUS

## 2015-06-26 MED ORDER — SODIUM CHLORIDE 0.9 % IV SOLN: 250.0000 mL | INTRAVENOUS | Status: DC | PRN

## 2015-06-26 MED ORDER — LOSARTAN POTASSIUM 50 MG PO TABS
100.0000 mg | ORAL_TABLET | Freq: Every day | ORAL | Status: DC
  Administered 2015-06-26 – 2015-06-28 (×3): 100 mg via ORAL
  Filled 2015-06-26 (×3): qty 2

## 2015-06-26 MED ORDER — DOFETILIDE 250 MCG PO CAPS: 250.0000 ug | ORAL_CAPSULE | Freq: Two times a day (BID) | ORAL | Status: DC

## 2015-06-26 MED ORDER — LEVOTHYROXINE SODIUM 75 MCG PO TABS
75.0000 ug | ORAL_TABLET | Freq: Every day | ORAL | Status: DC
  Administered 2015-06-27 – 2015-06-28 (×2): 75 ug via ORAL
  Filled 2015-06-26 (×2): qty 1

## 2015-06-26 MED ORDER — DILTIAZEM HCL 100 MG IV SOLR
INTRAVENOUS | Status: AC
  Filled 2015-06-26: qty 100

## 2015-06-26 MED ORDER — RIVAROXABAN 20 MG PO TABS
20.0000 mg | ORAL_TABLET | Freq: Every day | ORAL | Status: DC
  Administered 2015-06-26 – 2015-06-27 (×2): 20 mg via ORAL
  Filled 2015-06-26 (×2): qty 1

## 2015-06-26 MED ORDER — ADULT MULTIVITAMIN W/MINERALS CH
1.0000 | ORAL_TABLET | Freq: Every day | ORAL | Status: DC
  Administered 2015-06-27 – 2015-06-28 (×2): 1 via ORAL
  Filled 2015-06-26 (×2): qty 1

## 2015-06-26 MED ORDER — ACETAMINOPHEN 325 MG PO TABS
650.0000 mg | ORAL_TABLET | Freq: Four times a day (QID) | ORAL | Status: DC | PRN
  Administered 2015-06-26 – 2015-06-27 (×2): 650 mg via ORAL
  Filled 2015-06-26 (×2): qty 2

## 2015-06-26 MED ORDER — METOPROLOL TARTRATE 25 MG PO TABS
25.0000 mg | ORAL_TABLET | Freq: Two times a day (BID) | ORAL | Status: DC
  Administered 2015-06-27 – 2015-06-28 (×2): 25 mg via ORAL
  Filled 2015-06-26 (×2): qty 1

## 2015-06-26 NOTE — ED Provider Notes (Signed)
CSN: [id number]     Arrival date & time 06/26/15  1006 History   First MD Initiated Contact with Patient 06/26/15 1007     No chief complaint on file.    (Consider location/radiation/quality/duration/timing/severity/associated sxs/prior Treatment) HPI   70 year old male with history of paroxysmal A. fib, CAD, carotid disease and hyperlipidemia brought here via EMS for evaluation of dizziness. Patient report last night developed a brief episode of central chest pain which he describes a sharp sensation to his midsternal region lasting for less than an hour and resolved without any specific treatment tried. Was no associated exertional chest pain lightheadedness dizziness nausea or diaphoresis during the episode. He woke up this morning and felt fine but after approximately 30 minutes he then developed sensation of room spinning vertigo along with a throbbing global headache. Episode lasting for approximately 5 minutes and resolved after he stood up to go to the bathroom. He then had 2 other separate episodes of dizziness and his wife is concerned prompting him to come to the ED for further evaluation. He described the feeling as "i feel like i was fading away".  Patient currently denies having any active dizziness or chest pain. His headache has resolved. In March 2016 he was diagnosed with A. fib with RVR in the setting of chest pain. He was on a beta blocker but this past week he was started on any medication, Tikosyn.  Since then, his monitor heart rate at home has been in the 50s. He does not take any other medication that can cause prolonged QT. He has no prior history of stroke. He denies having any fever, chills, URI symptoms, neck pain, active chest pain, shortness of breath, abdominal pain, nausea vomiting diarrhea, focal numbness or weakness, or rash. He denies any mental confusion, difficulty thinking or slurring of speech.     Past Medical History  Diagnosis Date  . Hyperlipidemia   .  Hypertensive heart disease   . Gout   . Chronic lower back pain   . Paroxysmal atrial fibrillation ([place])     a. Dx 04/2014; b. 05/2014 Echo: Ef 60-65%, no rwma, triv MR/TR, nl RV; c. CHA2DS2VASc = 3-->was on eliquis, switched to xarelto 04/2015 2/2 cost.  . Diverticulosis   . Internal hemorrhoids   . BPH with urinary obstruction   . Nephrolithiasis   . Hypothyroidism   . Non-obstructive CAD     a. 07/2002 Cath: LM 20, LAD 63m/d, LCX 50-1m, OM1 77m, RCA 102m, EF 60%; b. 05/2014 MV: low risk w/ small sized, mild intensity rev defect in apical/inferior/infsept area, nl EF->Med Rx.  . Carotid disease, bilateral ([place])     a. 09/2014 Carotid U/S: 1-39% bilat ICA stenosis.   Past Surgical History  Procedure Laterality Date  . Lumbar disc surgery  1998    2 lumbar discs, Dr. Ellene Route   . Hand surgery Right   . Colonoscopy  04-24-05    per Dr. Henrene Pastor, clear, repeat 10 yrs   Family History  Problem Relation Age of Onset  . Dementia Mother   . Stroke Father   . Diabetes Paternal Grandmother   . Stroke Paternal Aunt     x 2  . Cancer Paternal Aunt     type unknown   Social History  Substance Use Topics  . Smoking status: Former Smoker    Types: Cigarettes    Quit date: 02/27/1995  . Smokeless tobacco: Never Used  . Alcohol Use: 0.0 oz/week    0 Standard  drinks or equivalent per week     Comment: couple times a month    Review of Systems  All other systems reviewed and are negative.     Allergies  Review of patient's allergies indicates no known allergies.  Home Medications   Prior to Admission medications   Medication Sig Start Date End Date Taking? Authorizing Provider  dofetilide (TIKOSYN) 500 MCG capsule Take 1 capsule (500 mcg total) by mouth 2 (two) times daily. 06/24/15   Amber Sena Slate, NP  hydrocortisone (ANUSOL-HC) 25 MG suppository Place 1 suppository (25 mg total) rectally at bedtime as needed for hemorrhoids or itching. One suppository per rectum at night as needed.  11/17/14   Irene Shipper, MD  ibuprofen (ADVIL,MOTRIN) 200 MG tablet Take 600 mg by mouth 3 (three) times daily as needed (pain).     Historical Provider, MD  levothyroxine (SYNTHROID, LEVOTHROID) 75 MCG tablet Take 1 tablet (75 mcg total) by mouth daily. 09/07/14   [name], MD  losartan (COZAAR) 100 MG tablet Take 1 tablet (100 mg total) by mouth daily. 06/17/15   Will [name], MD  metoprolol (LOPRESSOR) 50 MG tablet Take 1 tablet (50 mg total) by mouth 2 (two) times daily. 06/15/15   Will [name], MD  simvastatin (ZOCOR) 20 MG tablet Take 1 tablet (20 mg total) by mouth daily. 09/21/14   [name], MD  XARELTO 20 MG TABS tablet TAKE 1 TABLET (20 MG TOTAL) BY MOUTH DAILY WITH SUPPER. 06/22/15   Lorretta Harp, MD   There were no vitals taken for this visit. Physical Exam  Constitutional: He is oriented to person, place, and time. He appears well-developed and well-nourished. No distress.  Caucasian male laying in bed in no acute discomfort.  HENT:  Head: Atraumatic.  Eyes: Conjunctivae are normal.  Neck: Neck supple.  No nuchal rigidity. No carotid bruit.  Cardiovascular: Intact distal pulses.   Bradycardia without murmurs rubs or gallops  Pulmonary/Chest: Effort normal and breath sounds normal.  Abdominal: Soft. There is no tenderness.  Musculoskeletal: He exhibits no edema.  Neurological: He is alert and oriented to person, place, and time. He has normal strength. No cranial nerve deficit or sensory deficit. He displays a negative Romberg sign. Coordination normal. GCS eye subscore is 4. GCS verbal subscore is 5. GCS motor subscore is 6.  Skin: No rash noted.  Psychiatric: He has a normal mood and affect.  Nursing note and vitals reviewed.   ED Course  Procedures (including critical care time) Labs Review Labs Reviewed  BASIC METABOLIC PANEL - Abnormal; Notable for the following:    Creatinine, Ser 1.37 (*)    GFR calc non Af Amer 51 (*)    GFR calc Af Amer 60  (*)    All other components within normal limits  CBC WITH DIFFERENTIAL/PLATELET  URINALYSIS, ROUTINE W REFLEX MICROSCOPIC (NOT AT [facility])  I-STAT TROPOININ, ED  CBG MONITORING, ED   10:40:13 Orthostatic Vital Signs KC  Orthostatic Lying  - BP- Lying: 121/60 mmHg ; Pulse- Lying: 52  Orthostatic Sitting - BP- Sitting: 126/74 mmHg ; Pulse- Sitting: 55  Orthostatic Standing at 0 minutes - BP- Standing at 0 minutes: 125/74 mmHg ; Pulse- Standing at 0 minutes: 58      Imaging Review Dg Chest 2 View  06/26/2015  CLINICAL DATA:  Chest pain last night. EXAM: CHEST  2 VIEW COMPARISON:  05/15/2014 FINDINGS: Normal heart size. Lungs clear. No pneumothorax. No pleural effusion. IMPRESSION:  No active cardiopulmonary disease. Electronically Signed   By: Marybelle Killings M.D.   On: 06/26/2015 11:36   I have personally reviewed and evaluated these images and lab results as part of my medical decision-making.   EKG Interpretation   Date/Time:  Sunday June 26 2015 10:16:44 EDT Ventricular Rate:  52 PR Interval:  143 QRS Duration: 86 QT Interval:  529 QTC Calculation: 492 R Axis:   43 Text Interpretation:  Sinus rhythm Borderline prolonged QT interval No  significant change since last tracing Confirmed by ALLEN  MD, ANTHONY  ([id number]) on 06/26/2015 10:24:53 AM      MDM   Final diagnoses:  Near syncope  Bradycardia    BP 127/85 mmHg  Pulse 53  Resp 14  Wt 112.038 kg  SpO2 96%   10:38 AM Patient here with intermittent chest pain, dizziness and headache. Recently started on Tikosyn.  EKG shows bradycardia, and borderline prolonged QT.  Work up initiated, care discussed with Dr. Zenia Resides.  Plan to consult cardiology. Pt has no focal neuro deficit on initial exam.    12:46 PM Vital signs stable. Patient remains bradycardic with heart rates in the 50s. Labs are reassuring. EKG showed prolonged QT interval. Normal orthostatic vital sign.  Currently awaits cardiology for consultation  admission.  12:51 PM Appreciate consultation from cardiologist, Dr. Debara Pickett who will see pt in the ER and will determine further management.   3:16 PM Cardiologist has seen and evaluate patient. Patient will be admitted to cardiology unit for monitoring and to reduce Tikosyn dose in half.  Pt agrees with plan.    Domenic Moras, PA-C 06/26/15 [phone]

## 2015-06-26 NOTE — ED Provider Notes (Signed)
Medical screening examination/treatment/procedure(s) were conducted as a shared visit with non-physician practitioner(s) and myself.  I personally evaluated the patient during the encounter.   EKG Interpretation   Date/Time:  Sunday June 26 2015 10:16:44 EDT Ventricular Rate:  52 PR Interval:  143 QRS Duration: 86 QT Interval:  529 QTC Calculation: 492 R Axis:   43 Text Interpretation:  Sinus rhythm Borderline prolonged QT interval No  significant change since last tracing Confirmed by Lular Letson  MD, Arbell Wycoff  ([id number]) on 06/26/2015 10:24:53 AM     Patient here after complaining of being dizzy today when he was in bed. No associated chest pain with this but did have chest pain last night was last for about an hour and resolved. He had palpitations after drinking a Pepsi after working outside a job for about 2 hours. He denies any syncope or near-syncope at this time. Consult cardiology for further advice  Lacretia Leigh, MD 06/26/15 1127

## 2015-06-26 NOTE — ED Notes (Signed)
To ED via [facility] from home-- episode of chest pain last night, started feeling dizzy this am while in bed. Was recently discharged-- Friday from here after being treated for a fib. Pt is alert/oriented x4, color -- pale.

## 2015-06-26 NOTE — H&P (Addendum)
ADMISSION HISTORY & PHYSICAL   Chief Complaint:  Dizziness, long QTc, chest pain  Cardiologist: Dr. Corky Sox  Primary Care Physician: [name], MD  HPI:  This is a 70 y.o. male with a past medical history significant for nonobstructive coronary disease, hypertension, dyslipidemia and hypothyroidism. In March 2016 was diagnosed with A. fib with RVR in the setting of chest pain. He was ultimately placed on Xarelto. He did fairly well without recurrent atrial fibrillation however recently he was on a monitor which demonstrated an A. fib burden between 30 and 100%. Based on this he was referred to Dr. Curt Bears for evaluation of antiarrhythmic therapy or possible ablation. They agreed for him to start on Tikosyn therapy. He was admitted on 06/21/15 and loaded on Tikosyn 500 g twice a day. Fortunately he spontaneously converted to sinus rhythm and was discharged on 06/24/15. Today he was brought in by EMS for evaluation of dizziness. He had some central chest pain which was sharp and persisted for less than an hour. Yesterday he worked in the yard all day, but felt well. He tried to stay hydrated. This morning he woke up with some sensation of room spinning which lasted for about 5 minutes and was accompanied by a racing heart - he says he could not feel his a-fib, just felt tired. He says the feeling was "like he was fading away" and that he "might die". EKG today shows a longer QTc interval at 492 ms with sinus bradycardia. This compares to his QTc of 436 milliseconds with a similar heart rate 2 days prior. Cardiology is asked to evaluate for dizziness, chest discomfort and prolonged QTC. Creatinine is stable from discharge. Troponin initially is 0. Potassium is 4.8.  PMHx:  Past Medical History  Diagnosis Date  . Hyperlipidemia   . Hypertensive heart disease   . Gout   . Chronic lower back pain   . Paroxysmal atrial fibrillation ([place])     a. Dx 04/2014; b. 05/2014 Echo: Ef 60-65%,  no rwma, triv MR/TR, nl RV; c. CHA2DS2VASc = 3-->was on eliquis, switched to xarelto 04/2015 2/2 cost.  . Diverticulosis   . Internal hemorrhoids   . BPH with urinary obstruction   . Nephrolithiasis   . Hypothyroidism   . Non-obstructive CAD     a. 07/2002 Cath: LM 20, LAD 16md, LCX 50-642mOM1 4082mCA 62m41m 60%; b. 05/2014 MV: low risk w/ small sized, mild intensity rev defect in apical/inferior/infsept area, nl EF->Med Rx.  . Carotid disease, bilateral ([facility])[place]  a. 09/2014 Carotid U/S: 1-39% bilat ICA stenosis.    Past Surgical History  Procedure Laterality Date  . Lumbar disc surgery  1998    2 lumbar discs, Dr. ElsnEllene Route Hand surgery Right   . Colonoscopy  04-24-05    per Dr. PerrHenrene Pastorear, repeat 10 yrs    FAMHx:  Family History  Problem Relation Age of Onset  . Dementia Mother   . Stroke Father   . Diabetes Paternal Grandmother   . Stroke Paternal Aunt     x 2  . Cancer Paternal Aunt     type unknown    SOCHx:   reports that he quit smoking about 20 years ago. His smoking use included Cigarettes. He has never used smokeless tobacco. He reports that he drinks alcohol. He reports that he does not use illicit drugs.  ALLERGIES:  No Known Allergies  ROS: Pertinent items noted in HPI and  remainder of comprehensive ROS otherwise negative.  HOME MEDS:   Medication List    ASK your doctor about these medications        dofetilide 500 MCG capsule  Commonly known as:  TIKOSYN  Take 1 capsule (500 mcg total) by mouth 2 (two) times daily.     hydrocortisone 25 MG suppository  Commonly known as:  ANUSOL-HC  Place 1 suppository (25 mg total) rectally at bedtime as needed for hemorrhoids or itching. One suppository per rectum at night as needed.     ibuprofen 200 MG tablet  Commonly known as:  ADVIL,MOTRIN  Take 600 mg by mouth 3 (three) times daily as needed (pain).     levothyroxine 75 MCG tablet  Commonly known as:  SYNTHROID, LEVOTHROID  Take 1 tablet (75 mcg  total) by mouth daily.     losartan 100 MG tablet  Commonly known as:  COZAAR  Take 1 tablet (100 mg total) by mouth daily.     metoprolol 50 MG tablet  Commonly known as:  LOPRESSOR  Take 1 tablet (50 mg total) by mouth 2 (two) times daily.     multivitamin with minerals tablet  Take 1 tablet by mouth daily.     simvastatin 20 MG tablet  Commonly known as:  ZOCOR  Take 1 tablet (20 mg total) by mouth daily.     XARELTO 20 MG Tabs tablet  Generic drug:  rivaroxaban  TAKE 1 TABLET (20 MG TOTAL) BY MOUTH DAILY WITH SUPPER.        LABS/IMAGING: Results for orders placed or performed during the hospital encounter of 06/26/15 (from the past 48 hour(s))  CBC with Differential/Platelet     Status: None   Collection Time: 06/26/15 10:36 AM  Result Value Ref Range   WBC 5.8 4.0 - 10.5 K/uL   RBC 4.52 4.22 - 5.81 MIL/uL   Hemoglobin 13.7 13.0 - 17.0 g/dL   HCT 40.5 39.0 - 52.0 %   MCV 89.6 78.0 - 100.0 fL   MCH 30.3 26.0 - 34.0 pg   MCHC 33.8 30.0 - 36.0 g/dL   RDW 12.9 11.5 - 15.5 %   Platelets 168 150 - 400 K/uL   Neutrophils Relative % 61 %   Neutro Abs 3.6 1.7 - 7.7 K/uL   Lymphocytes Relative 23 %   Lymphs Abs 1.3 0.7 - 4.0 K/uL   Monocytes Relative 10 %   Monocytes Absolute 0.6 0.1 - 1.0 K/uL   Eosinophils Relative 6 %   Eosinophils Absolute 0.3 0.0 - 0.7 K/uL   Basophils Relative 0 %   Basophils Absolute 0.0 0.0 - 0.1 K/uL  Basic metabolic panel     Status: Abnormal   Collection Time: 06/26/15 10:36 AM  Result Value Ref Range   Sodium 144 135 - 145 mmol/L   Potassium 4.8 3.5 - 5.1 mmol/L   Chloride 110 101 - 111 mmol/L   CO2 25 22 - 32 mmol/L   Glucose, Bld 99 65 - 99 mg/dL   BUN 20 6 - 20 mg/dL   Creatinine, Ser 1.37 (H) 0.61 - 1.24 mg/dL   Calcium 9.1 8.9 - 10.3 mg/dL   GFR calc non Af Amer 51 (L) >60 mL/min   GFR calc Af Amer 60 (L) >60 mL/min    Comment: (NOTE) The eGFR has been calculated using the CKD EPI equation. This calculation has not been  validated in all clinical situations. eGFR's persistently <60 mL/min signify possible Chronic Kidney Disease.  Anion gap 9 5 - 15  I-stat troponin, ED     Status: None   Collection Time: 06/26/15 10:42 AM  Result Value Ref Range   Troponin i, poc 0.00 0.00 - 0.08 ng/mL   Comment 3            Comment: Due to the release kinetics of cTnI, a negative result within the first hours of the onset of symptoms does not rule out myocardial infarction with certainty. If myocardial infarction is still suspected, repeat the test at appropriate intervals.   CBG monitoring, ED     Status: None   Collection Time: 06/26/15 11:04 AM  Result Value Ref Range   Glucose-Capillary 90 65 - 99 mg/dL   Dg Chest 2 View  06/26/2015  CLINICAL DATA:  Chest pain last night. EXAM: CHEST  2 VIEW COMPARISON:  05/15/2014 FINDINGS: Normal heart size. Lungs clear. No pneumothorax. No pleural effusion. IMPRESSION: No active cardiopulmonary disease. Electronically Signed   By: Marybelle Killings M.D.   On: 06/26/2015 11:36    VITALS: Filed Vitals:   06/26/15 1020  BP: 127/85  Pulse: 53  Resp: 14    EXAM: General appearance: alert and no distress Neck: no carotid bruit and no JVD Lungs: clear to auscultation bilaterally Heart: regular rate and rhythm, S1, S2 normal, no murmur, click, rub or gallop Abdomen: soft, non-tender; bowel sounds normal; no masses,  no organomegaly Extremities: extremities normal, atraumatic, no cyanosis or edema Pulses: 2+ and symmetric Skin: Skin color, texture, turgor normal. No rashes or lesions Neurologic: Grossly normal Psych: Pleasant  IMPRESSION: Principal Problem:   Long QT interval Active Problems:   Rapid palpitations   Dizziness   Visit for monitoring Tikosyn therapy   Atypical chest pain   PLAN: 1. Rapid palpitations/dizziness- suspect this could've been an episode of paroxysmal A. fib or possibly a ventricular arrhythmia given his prolonged QT interval. Would  recommend holding Tikosyn tonight and reevaluating EKG tomorrow with a possible dose reduction to 250 g twice a day. 2. Long QTC/bradycardia- EKG today shows a prolonged QTC with similar heart rate to his discharge EKG. This could've put him at risk for ventricular arrhythmias. He reported dizziness and rapid palpitations although he says he could not feel his A. fib before, suggesting that this may been a ventricular arrhythmia. He said he "squatted down" and felt his abnormal rhythm terminate. Decrease metoprolol to 25 mg twice a day for bradycardia (heart rate 40s and 50s 3. Atypical chest pain- short-lived, sharp central chest pain. Initial troponin is negative. This is quite atypical and I doubt represents coronary ischemia.  We will admit to telemetry. Recheck EKG tomorrow morning and consider dose reduction on Tikosyn. EP consultation in the morning.  Pixie Casino, MD, [facility] Attending Cardiologist [place] Arrick Dutton 06/26/2015, 2:58 PM

## 2015-06-27 ENCOUNTER — Encounter (HOSPITAL_COMMUNITY)
Admission: EM | Disposition: A | Discharge status: Home / Self Care | Payer: Self-pay | Source: Home / Self Care | Attending: Internal Medicine

## 2015-06-27 DIAGNOSIS — M109 Gout, unspecified: Secondary | ICD-10-CM | POA: Diagnosis present

## 2015-06-27 DIAGNOSIS — I480 Paroxysmal atrial fibrillation: Secondary | ICD-10-CM | POA: Diagnosis not present

## 2015-06-27 DIAGNOSIS — N138 Other obstructive and reflux uropathy: Secondary | ICD-10-CM | POA: Diagnosis not present

## 2015-06-27 DIAGNOSIS — I119 Hypertensive heart disease without heart failure: Secondary | ICD-10-CM | POA: Diagnosis not present

## 2015-06-27 DIAGNOSIS — Z7901 Long term (current) use of anticoagulants: Secondary | ICD-10-CM | POA: Diagnosis not present

## 2015-06-27 DIAGNOSIS — I4581 Long QT syndrome: Secondary | ICD-10-CM | POA: Diagnosis not present

## 2015-06-27 DIAGNOSIS — G8929 Other chronic pain: Secondary | ICD-10-CM | POA: Diagnosis present

## 2015-06-27 DIAGNOSIS — M545 Low back pain: Secondary | ICD-10-CM | POA: Diagnosis present

## 2015-06-27 DIAGNOSIS — Z87891 Personal history of nicotine dependence: Secondary | ICD-10-CM | POA: Diagnosis not present

## 2015-06-27 DIAGNOSIS — R0789 Other chest pain: Secondary | ICD-10-CM | POA: Diagnosis present

## 2015-06-27 DIAGNOSIS — I2510 Atherosclerotic heart disease of native coronary artery without angina pectoris: Secondary | ICD-10-CM | POA: Diagnosis present

## 2015-06-27 DIAGNOSIS — J9811 Atelectasis: Secondary | ICD-10-CM | POA: Diagnosis not present

## 2015-06-27 DIAGNOSIS — E039 Hypothyroidism, unspecified: Secondary | ICD-10-CM | POA: Diagnosis present

## 2015-06-27 DIAGNOSIS — N401 Enlarged prostate with lower urinary tract symptoms: Secondary | ICD-10-CM | POA: Diagnosis present

## 2015-06-27 DIAGNOSIS — E785 Hyperlipidemia, unspecified: Secondary | ICD-10-CM | POA: Diagnosis present

## 2015-06-27 DIAGNOSIS — R001 Bradycardia, unspecified: Secondary | ICD-10-CM | POA: Diagnosis not present

## 2015-06-27 DIAGNOSIS — I495 Sick sinus syndrome: Secondary | ICD-10-CM | POA: Diagnosis not present

## 2015-06-27 DIAGNOSIS — Z79899 Other long term (current) drug therapy: Secondary | ICD-10-CM | POA: Diagnosis not present

## 2015-06-27 DIAGNOSIS — I10 Essential (primary) hypertension: Secondary | ICD-10-CM | POA: Diagnosis not present

## 2015-06-27 HISTORY — PX: EP IMPLANTABLE DEVICE: SHX172B

## 2015-06-27 HISTORY — PX: COLONOSCOPY: SHX174

## 2015-06-27 LAB — BASIC METABOLIC PANEL
Anion gap: 9 (ref 5–15)
BUN: 20 mg/dL (ref 6–20)
CHLORIDE: 110 mmol/L (ref 101–111)
CO2: 24 mmol/L (ref 22–32)
CREATININE: 1.28 mg/dL — AB (ref 0.61–1.24)
Calcium: 9.2 mg/dL (ref 8.9–10.3)
GFR calc Af Amer: >60 mL/min (ref >60)
GFR calc non Af Amer: 56 mL/min — ABNORMAL LOW (ref >60)
GLUCOSE: 97 mg/dL (ref 65–99)
POTASSIUM: 4.4 mmol/L (ref 3.5–5.1)
SODIUM: 143 mmol/L (ref 135–145)

## 2015-06-27 LAB — MAGNESIUM: Magnesium: 2 mg/dL (ref 1.7–2.4)

## 2015-06-27 SURGERY — PACEMAKER IMPLANT

## 2015-06-27 MED ORDER — LIDOCAINE HCL (PF) 1 % IJ SOLN
INTRAMUSCULAR | Status: DC | PRN
  Administered 2015-06-27: 47 mL

## 2015-06-27 MED ORDER — SODIUM CHLORIDE 0.9 % IV SOLN
INTRAVENOUS | Status: DC | PRN
  Administered 2015-06-27: 50 mL/h via INTRAVENOUS

## 2015-06-27 MED ORDER — SODIUM CHLORIDE 0.9% FLUSH
3.0000 mL | Freq: Two times a day (BID) | INTRAVENOUS | Status: DC
  Administered 2015-06-27: 3 mL via INTRAVENOUS

## 2015-06-27 MED ORDER — CEFAZOLIN SODIUM-DEXTROSE 2-4 GM/100ML-% IV SOLN
2.0000 g | Freq: Four times a day (QID) | INTRAVENOUS | Status: AC
  Administered 2015-06-27 – 2015-06-28 (×3): 2 g via INTRAVENOUS
  Filled 2015-06-27 (×3): qty 100

## 2015-06-27 MED ORDER — SODIUM CHLORIDE 0.9 % IV SOLN: 250.0000 mL | INTRAVENOUS | Status: DC

## 2015-06-27 MED ORDER — MIDAZOLAM HCL 5 MG/5ML IJ SOLN
INTRAMUSCULAR | Status: DC | PRN
  Administered 2015-06-27: 1 mg via INTRAVENOUS
  Administered 2015-06-27: 2 mg via INTRAVENOUS

## 2015-06-27 MED ORDER — LIDOCAINE HCL (PF) 1 % IJ SOLN
INTRAMUSCULAR | Status: AC
  Filled 2015-06-27: qty 60

## 2015-06-27 MED ORDER — FENTANYL CITRATE (PF) 100 MCG/2ML IJ SOLN
INTRAMUSCULAR | Status: AC
  Filled 2015-06-27: qty 2

## 2015-06-27 MED ORDER — CHLORHEXIDINE GLUCONATE 4 % EX LIQD
60.0000 mL | Freq: Once | CUTANEOUS | Status: AC
  Administered 2015-06-27: 4 via TOPICAL
  Filled 2015-06-27: qty 15

## 2015-06-27 MED ORDER — HEPARIN (PORCINE) IN NACL 2-0.9 UNIT/ML-% IJ SOLN
INTRAMUSCULAR | Status: DC | PRN
  Administered 2015-06-27: 14:00:00

## 2015-06-27 MED ORDER — MIDAZOLAM HCL 5 MG/5ML IJ SOLN
INTRAMUSCULAR | Status: AC
  Filled 2015-06-27: qty 5

## 2015-06-27 MED ORDER — FENTANYL CITRATE (PF) 100 MCG/2ML IJ SOLN
INTRAMUSCULAR | Status: DC | PRN
  Administered 2015-06-27: 12.5 ug via INTRAVENOUS
  Administered 2015-06-27: 25 ug via INTRAVENOUS

## 2015-06-27 MED ORDER — SODIUM CHLORIDE 0.9% FLUSH: 3.0000 mL | INTRAVENOUS | Status: DC | PRN

## 2015-06-27 MED ORDER — YOU HAVE A PACEMAKER BOOK
Freq: Once | Status: AC
  Administered 2015-06-27: 11:00:00
  Filled 2015-06-27: qty 1

## 2015-06-27 MED ORDER — HEPARIN (PORCINE) IN NACL 2-0.9 UNIT/ML-% IJ SOLN
INTRAMUSCULAR | Status: AC
  Filled 2015-06-27: qty 500

## 2015-06-27 MED ORDER — CEFAZOLIN SODIUM-DEXTROSE 2-4 GM/100ML-% IV SOLN
INTRAVENOUS | Status: AC
  Filled 2015-06-27: qty 100

## 2015-06-27 MED ORDER — GENTAMICIN SULFATE 40 MG/ML IJ SOLN
80.0000 mg | INTRAMUSCULAR | Status: AC
  Administered 2015-06-27: 80 mg

## 2015-06-27 MED ORDER — ACETAMINOPHEN 325 MG PO TABS: 325.0000 mg | ORAL_TABLET | ORAL | Status: DC | PRN

## 2015-06-27 MED ORDER — SODIUM CHLORIDE 0.9 % IV SOLN
INTRAVENOUS | Status: DC
Start: 2015-06-27 — End: 2015-06-27
  Administered 2015-06-27: 10:00:00 via INTRAVENOUS

## 2015-06-27 MED ORDER — CHLORHEXIDINE GLUCONATE 4 % EX LIQD
CUTANEOUS | Status: AC
  Filled 2015-06-27: qty 15

## 2015-06-27 MED ORDER — OXYCODONE-ACETAMINOPHEN 5-325 MG PO TABS
1.0000 | ORAL_TABLET | ORAL | Status: DC | PRN
  Administered 2015-06-27 (×2): 1 via ORAL
  Administered 2015-06-28 (×2): 2 via ORAL
  Filled 2015-06-27 (×3): qty 2

## 2015-06-27 MED ORDER — CEFAZOLIN SODIUM-DEXTROSE 2-4 GM/100ML-% IV SOLN
2.0000 g | INTRAVENOUS | Status: AC
  Administered 2015-06-27: 2 g via INTRAVENOUS

## 2015-06-27 MED ORDER — SODIUM CHLORIDE 0.9 % IR SOLN
Status: AC
  Filled 2015-06-27: qty 2

## 2015-06-27 MED ORDER — ONDANSETRON HCL 4 MG/2ML IJ SOLN
4.0000 mg | Freq: Four times a day (QID) | INTRAMUSCULAR | Status: DC | PRN
  Administered 2015-06-28: 4 mg via INTRAVENOUS
  Filled 2015-06-27: qty 2

## 2015-06-27 SURGICAL SUPPLY — 7 items
CABLE SURGICAL S-101-97-12 (CABLE) ×3 IMPLANT
LEAD TENDRIL MRI 52CM LPA1200M (Lead) ×3 IMPLANT
LEAD TENDRIL MRI 58CM LPA1200M (Lead) ×3 IMPLANT
PACEMAKER ASSURITY DR-RF (Pacemaker) ×3 IMPLANT
PAD DEFIB LIFELINK (PAD) ×3 IMPLANT
SHEATH CLASSIC 8F (SHEATH) ×6 IMPLANT
TRAY PACEMAKER INSERTION (PACKS) ×3 IMPLANT

## 2015-06-27 NOTE — Progress Notes (Signed)
Orthopedic Tech Progress Note Patient Details:  Erik [name] May 16, 1945 [id number]  Ortho Devices Type of Ortho Device: Arm sling Ortho Device/Splint Interventions: Veleta Miners 06/27/2015, 3:07 PM

## 2015-06-27 NOTE — H&P (Signed)
Primary Care Physician: [name], MD Referring Physician:  Admit Date: 06/26/2015  Reason for consultation: AF  Erik [name] is a 70 y.o. male with a h/o nonobstructive coronary disease, hypertension, dyslipidemia and hypothyroidism. In March 2016 was diagnosed with A. fib with RVR in the setting of chest pain. He was ultimately placed on Xarelto. He did fairly well without recurrent atrial fibrillation however recently he was on a monitor which demonstrated an A. fib burden between 30 and 100%. He was admitted on 06/21/15 and loaded on Tikosyn 500 g twice a day. Fortunately he spontaneously converted to sinus rhythm and was discharged on 06/24/15. He was brought in by EMS yesterday for evaluation of dizziness. He had some central chest pain which was sharp and persisted for less than an hour. He says that it felt like his heart was going to jump out of his chest. Yesterday he worked in the yard all day, but felt well. He tried to stay hydrated. This morning he woke up with some sensation of room spinning which lasted for about 5 minutes and was accompanied by a racing heart - he says he could not feel his a-fib, just felt tired. EKG today shows a longer QTc interval at 492 ms with sinus bradycardia. This compares to his QTc of 436 milliseconds with a similar heart rate 2 days prior. He had a similar episode while in the hospital. He was told at that time that he was in AF. He says that he felt cold and clammy, with weakness and dizziness.  Today, he denies symptoms of palpitations, chest pain, shortness of breath, orthopnea, PND, lower extremity edema, dizziness, presyncope, syncope, or neurologic sequela. The patient is tolerating medications without difficulties and is otherwise without complaint today.   Past Medical History  Diagnosis Date  . Hyperlipidemia   . Hypertensive heart disease   . Gout   . Chronic lower back pain   . Paroxysmal atrial fibrillation  ([place])     a. Dx 04/2014; b. 05/2014 Echo: Ef 60-65%, no rwma, triv MR/TR, nl RV; c. CHA2DS2VASc = 3-->was on eliquis, switched to xarelto 04/2015 2/2 cost.  . Diverticulosis   . Internal hemorrhoids   . BPH with urinary obstruction   . Nephrolithiasis   . Hypothyroidism   . Non-obstructive CAD     a. 07/2002 Cath: LM 20, LAD 72m/d, LCX 50-53m, OM1 27m, RCA 22m, EF 60%; b. 05/2014 MV: low risk w/ small sized, mild intensity rev defect in apical/inferior/infsept area, nl EF->Med Rx.  . Carotid disease, bilateral ([place])     a. 09/2014 Carotid U/S: 1-39% bilat ICA stenosis.   Past Surgical History  Procedure Laterality Date  . Lumbar disc surgery  1998    2 lumbar discs, Dr. Ellene Route   . Hand surgery Right   . Colonoscopy  04-24-05    per Dr. Henrene Pastor, clear, repeat 10 yrs    . dofetilide 250 mcg Oral BID  . levothyroxine 75 mcg Oral QAC breakfast  . losartan 100 mg Oral Daily  . metoprolol 25 mg Oral BID  . multivitamin with minerals 1 tablet Oral Daily  . rivaroxaban 20 mg Oral Q supper  . simvastatin 20 mg Oral q1800  . sodium chloride flush 3 mL Intravenous Q12H   . diltiazem (CARDIZEM) infusion Stopped (06/26/15 2059)    No Known Allergies  Social History   Social History  . Marital Status: Married    Spouse Name: N/A  . Number of Children: 1  .  Years of Education: N/A   Occupational History  . retired    Social History Main Topics  . Smoking status: Former Smoker    Types: Cigarettes    Quit date: 02/27/1995  . Smokeless tobacco: Never Used  . Alcohol Use: 0.0 oz/week    0 Standard drinks or equivalent per week     Comment: couple times a month  . Drug Use: No  . Sexual Activity: Not on file   Other Topics Concern  . Not on file   Social History Narrative    Family History  Problem  Relation Age of Onset  . Dementia Mother   . Stroke Father   . Diabetes Paternal Grandmother   . Stroke Paternal Aunt     x 2  . Cancer Paternal Aunt     type unknown    ROS- All systems are reviewed and negative except as per the HPI above  Physical Exam: Telemetry: Filed Vitals:   06/26/15 1500 06/26/15 1652 06/26/15 2130 06/27/15 0500  BP: 135/76 151/92 124/67 139/79  Pulse: 49 51 49 55  Temp:  97.5 F (36.4 C) 97.8 F (36.6 C) 98 F (36.7 C)  TempSrc:  Oral Oral Oral  Resp: 11 22    Weight:    243 lb (110.224 kg)  SpO2: 98% 98% 98% 96%    GEN- The patient is well appearing, alert and oriented x 3 today.  Head- normocephalic, atraumatic Eyes- Sclera clear, conjunctiva pink Ears- hearing intact Oropharynx- clear Neck- supple, no JVP Lymph- no cervical lymphadenopathy Lungs- Clear to ausculation bilaterally, normal work of breathing Heart- Regular rate and rhythm, no murmurs, rubs or gallops, PMI not laterally displaced GI- soft, NT, ND, + BS Extremities- no clubbing, cyanosis, or edema MS- no significant deformity or atrophy Skin- no rash or lesion Psych- euthymic mood, full affect Neuro- strength and sensation are intact  EKG: Sinus rhythm, atrial fibrillation, QTc 492  Labs:  Lab Results  Component Value Date   WBC 5.8 06/26/2015   HGB 13.7 06/26/2015   HCT 40.5 06/26/2015   MCV 89.6 06/26/2015   PLT 168 06/26/2015    Recent Labs Lab 06/27/15 0527  NA 143  K 4.4  CL 110  CO2 24  BUN 20  CREATININE 1.28*  CALCIUM 9.2  GLUCOSE 97    Recent Labs    No results found for: CKTOTAL, CKMB, CKMBINDEX, TROPONINI   Lab Results  Component Value Date   CHOL 146 08/25/2014   CHOL 143 06/18/2013   CHOL 171 11/19/2011    Recent Labs    Lab Results  Component Value Date   HDL 35.60* 08/25/2014   HDL 38.20*  06/18/2013   HDL 43.10 11/19/2011      Recent Labs    Lab Results  Component Value Date   LDLCALC 79 08/25/2014   LDLCALC 74 06/18/2013   LDLCALC 104* 11/19/2011      Recent Labs    Lab Results  Component Value Date   TRIG 157.0* 08/25/2014   TRIG 153.0* 06/18/2013   TRIG 118.0 11/19/2011      Recent Labs    Lab Results  Component Value Date   CHOLHDL 4 08/25/2014   CHOLHDL 4 06/18/2013   CHOLHDL 4 11/19/2011      Recent Labs    Lab Results  Component Value Date   LDLDIRECT 104.9 06/09/2009   LDLDIRECT 104.9 12/10/2005       Echo:2016 - Left ventricle: The cavity size was normal.  There was moderate concentric hypertrophy. Systolic function was normal. The estimated ejection fraction was in the range of 60% to 65%. Wall motion was normal; there were no regional wall motion abnormalities. Left ventricular diastolic function parameters were normal. - Aortic valve: Structurally normal valve. Trileaflet. There was no significant regurgitation. - Mitral valve: Structurally normal valve. There was trivial regurgitation. - Right ventricle: Systolic function was normal. - Right atrium: The atrium was normal in size. - Atrial septum: No defect or patent foramen ovale was identified. - Tricuspid valve: There was trivial regurgitation. - Pulmonic valve: There was no regurgitation. - Inferior vena cava: The vessel was normal in size. The respirophasic diameter changes were in the normal range (>= 50%), consistent with normal central venous pressure.  ASSESSMENT AND PLAN:   1. Paroxysmal atrial fibrillation: Was previously on tikosyn but reverted back to atrial fibrillation. QTc on ECG yesterday 492. Due to rapid palpitations and atrial fibrillation while on tikosyn, will plan to stop Tikosyn and switch him to amiodarone. I discussed with him the option of ablation. Discussed risks and  benefits. Risks include bleeding, tamponade, heart block, stroke, and damage to surrounding organs. The patient understands these risks and would like to further consider this option. Will discuss transitioning from tikosyn to amiodarone with pharmacy. He was having pauses while on the monitor. Although these were while he was getting the diltiazem drip, he has had issues with bradycardia in the past and has had also pauses in the past. His current symptoms prior to hospitalization sound like they could possibly be related to pauses as well. 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 We discussed the risks and benefits of the procedure. Risks include bleeding, infection, tamponade, and pneumothorax. He has agreed to the procedure. We'll plan on pacemaker placement today.  This patients CHA2DS2-VASc Score and unadjusted Ischemic Stroke Rate (% per year) is equal to 2.2 % stroke rate/year from a score of 2  Above score calculated as 1 point each if present [CHF, HTN, DM, Vascular=MI/PAD/Aortic Plaque, Age if 65-74, or Male] Above score calculated as 2 points each if present [Age > 75, or Stroke/TIA/TE]     EP Attending  Patient seen and examined. Agree with the documentation as noted above. Will plan to proceed with DDD PM insertion.   Mikle Bosworth.D.

## 2015-06-27 NOTE — Plan of Care (Signed)
Problem: Consults Goal: Permanent Pacemaker Patient Education (See Patient Education module for education specifics.) Outcome: Progressing Pt education on pacemaker provided. Pt currently watching video with wife. No questions at this time  Problem: Phase I Progression Outcomes Goal: Procedure Consent complete Outcome: Completed/Met Date Met:  06/27/15 Consent signed and in chart

## 2015-06-27 NOTE — Consult Note (Addendum)
CARDIOLOGY CONSULT NOTE     Primary Care Physician: [name], MD Referring Physician:  Admit Date: 06/26/2015  Reason for consultation: AF  Erik [name] is a 70 y.o. male with a h/o nonobstructive coronary disease, hypertension, dyslipidemia and hypothyroidism. In March 2016 was diagnosed with A. fib with RVR in the setting of chest pain. He was ultimately placed on Xarelto. He did fairly well without recurrent atrial fibrillation however recently he was on a monitor which demonstrated an A. fib burden between 30 and 100%. He was admitted on 06/21/15 and loaded on Tikosyn 500 g twice a day. Fortunately he spontaneously converted to sinus rhythm and was discharged on 06/24/15. He was brought in by EMS yesterday for evaluation of dizziness. He had some central chest pain which was sharp and persisted for less than an hour.  He says that it felt like his heart was going to jump out of his chest.   Yesterday he worked in the yard all day, but felt well. He tried to stay hydrated. This morning he woke up with some sensation of room spinning which lasted for about 5 minutes and was accompanied by a racing heart - he says he could not feel his a-fib, just felt tired. EKG today shows a longer QTc interval at 492 ms with sinus bradycardia. This compares to his QTc of 436 milliseconds with a similar heart rate 2 days prior. He had a similar episode while in the hospital.  He was told at that time that he was in AF.  He says that he felt cold and clammy, with weakness and dizziness.  Today, he denies symptoms of palpitations, chest pain, shortness of breath, orthopnea, PND, lower extremity edema, dizziness, presyncope, syncope, or neurologic sequela. The patient is tolerating medications without difficulties and is otherwise without complaint today.   Past Medical History  Diagnosis Date  . Hyperlipidemia   . Hypertensive heart disease   . Gout   . Chronic lower back pain   . Paroxysmal atrial  fibrillation ([place])     a. Dx 04/2014; b. 05/2014 Echo: Ef 60-65%, no rwma, triv MR/TR, nl RV; c. CHA2DS2VASc = 3-->was on eliquis, switched to xarelto 04/2015 2/2 cost.  . Diverticulosis   . Internal hemorrhoids   . BPH with urinary obstruction   . Nephrolithiasis   . Hypothyroidism   . Non-obstructive CAD     a. 07/2002 Cath: LM 20, LAD 7m/d, LCX 50-28m, OM1 28m, RCA 64m, EF 60%; b. 05/2014 MV: low risk w/ small sized, mild intensity rev defect in apical/inferior/infsept area, nl EF->Med Rx.  . Carotid disease, bilateral ([place])     a. 09/2014 Carotid U/S: 1-39% bilat ICA stenosis.   Past Surgical History  Procedure Laterality Date  . Lumbar disc surgery  1998    2 lumbar discs, Dr. Ellene Route   . Hand surgery Right   . Colonoscopy  04-24-05    per Dr. Henrene Pastor, clear, repeat 10 yrs    . dofetilide  250 mcg Oral BID  . levothyroxine  75 mcg Oral QAC breakfast  . losartan  100 mg Oral Daily  . metoprolol  25 mg Oral BID  . multivitamin with minerals  1 tablet Oral Daily  . rivaroxaban  20 mg Oral Q supper  . simvastatin  20 mg Oral q1800  . sodium chloride flush  3 mL Intravenous Q12H   . diltiazem (CARDIZEM) infusion Stopped (06/26/15 2059)    No Known Allergies  Social History  Social History  . Marital Status: Married    Spouse Name: N/A  . Number of Children: 1  . Years of Education: N/A   Occupational History  . retired    Social History Main Topics  . Smoking status: Former Smoker    Types: Cigarettes    Quit date: 02/27/1995  . Smokeless tobacco: Never Used  . Alcohol Use: 0.0 oz/week    0 Standard drinks or equivalent per week     Comment: couple times a month  . Drug Use: No  . Sexual Activity: Not on file   Other Topics Concern  . Not on file   Social History Narrative    Family History  Problem Relation Age of Onset  . Dementia Mother   . Stroke Father   . Diabetes Paternal Grandmother   . Stroke Paternal Aunt     x 2  . Cancer Paternal Aunt      type unknown    ROS- All systems are reviewed and negative except as per the HPI above  Physical Exam: Telemetry: Filed Vitals:   06/26/15 1500 06/26/15 1652 06/26/15 2130 06/27/15 0500  BP: 135/76 151/92 124/67 139/79  Pulse: 49 51 49 55  Temp:  97.5 F (36.4 C) 97.8 F (36.6 C) 98 F (36.7 C)  TempSrc:  Oral Oral Oral  Resp: 11 22    Weight:    243 lb (110.224 kg)  SpO2: 98% 98% 98% 96%    GEN- The patient is well appearing, alert and oriented x 3 today.   Head- normocephalic, atraumatic Eyes-  Sclera clear, conjunctiva pink Ears- hearing intact Oropharynx- clear Neck- supple, no JVP Lymph- no cervical lymphadenopathy Lungs- Clear to ausculation bilaterally, normal work of breathing Heart- Regular rate and rhythm, no murmurs, rubs or gallops, PMI not laterally displaced GI- soft, NT, ND, + BS Extremities- no clubbing, cyanosis, or edema MS- no significant deformity or atrophy Skin- no rash or lesion Psych- euthymic mood, full affect Neuro- strength and sensation are intact  EKG: Sinus rhythm, atrial fibrillation, QTc 492  Labs:   Lab Results  Component Value Date   WBC 5.8 06/26/2015   HGB 13.7 06/26/2015   HCT 40.5 06/26/2015   MCV 89.6 06/26/2015   PLT 168 06/26/2015    Recent Labs Lab 06/27/15 0527  NA 143  K 4.4  CL 110  CO2 24  BUN 20  CREATININE 1.28*  CALCIUM 9.2  GLUCOSE 97   No results found for: CKTOTAL, CKMB, CKMBINDEX, TROPONINI Lab Results  Component Value Date   CHOL 146 08/25/2014   CHOL 143 06/18/2013   CHOL 171 11/19/2011   Lab Results  Component Value Date   HDL 35.60* 08/25/2014   HDL 38.20* 06/18/2013   HDL 43.10 11/19/2011   Lab Results  Component Value Date   LDLCALC 79 08/25/2014   LDLCALC 74 06/18/2013   LDLCALC 104* 11/19/2011   Lab Results  Component Value Date   TRIG 157.0* 08/25/2014   TRIG 153.0* 06/18/2013   TRIG 118.0 11/19/2011   Lab Results  Component Value Date   CHOLHDL 4 08/25/2014   CHOLHDL  4 06/18/2013   CHOLHDL 4 11/19/2011   Lab Results  Component Value Date   LDLDIRECT 104.9 06/09/2009   LDLDIRECT 104.9 12/10/2005      Echo:2016 - Left ventricle: The cavity size was normal. There was moderate concentric hypertrophy. Systolic function was normal. The estimated ejection fraction was in the range of 60% to 65%. Wall motion  was normal; there were no regional wall motion abnormalities. Left ventricular diastolic function parameters were normal. - Aortic valve: Structurally normal valve. Trileaflet. There was no significant regurgitation. - Mitral valve: Structurally normal valve. There was trivial regurgitation. - Right ventricle: Systolic function was normal. - Right atrium: The atrium was normal in size. - Atrial septum: No defect or patent foramen ovale was identified. - Tricuspid valve: There was trivial regurgitation. - Pulmonic valve: There was no regurgitation. - Inferior vena cava: The vessel was normal in size. The respirophasic diameter changes were in the normal range (>= 50%), consistent with normal central venous pressure.  ASSESSMENT AND PLAN:   1. Paroxysmal atrial fibrillation: Was previously on tikosyn but reverted back to atrial fibrillation.  QTc on ECG yesterday 492.  Due to rapid palpitations and atrial fibrillation while on tikosyn, Jolisa Intriago plan to stop Tikosyn and switch him to amiodarone.  I discussed with him the option of ablation.  Discussed risks and benefits.  Risks include bleeding, tamponade, heart block, stroke, and damage to surrounding organs.  The patient understands these risks and would like to further consider this option.  Everett Ricciardelli discuss transitioning from tikosyn to amiodarone with pharmacy.  He was having pauses while on the monitor. Although these were while he was getting the diltiazem drip, he has had issues with bradycardia in the past and has had also pauses in the past. His current symptoms prior to  hospitalization sound like they could possibly be related to pauses as well. 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 We discussed the risks and benefits of the procedure. Risks include bleeding, infection, tamponade, and pneumothorax. He has agreed to the procedure. We'll plan on pacemaker placement today.  This patients CHA2DS2-VASc Score and unadjusted Ischemic Stroke Rate (% per year) is equal to 2.2 % stroke rate/year from a score of 2  Above score calculated as 1 point each if present [CHF, HTN, DM, Vascular=MI/PAD/Aortic Plaque, Age if 65-74, or Male] Above score calculated as 2 points each if present [Age > 75, or Stroke/TIA/TE]      Halyn Flaugher [name], MD 06/27/2015  8:21 AM

## 2015-06-28 ENCOUNTER — Inpatient Hospital Stay (HOSPITAL_COMMUNITY): Payer: Medicare Other

## 2015-06-28 ENCOUNTER — Encounter (HOSPITAL_COMMUNITY): Payer: Self-pay | Admitting: Internal Medicine

## 2015-06-28 DIAGNOSIS — I495 Sick sinus syndrome: Principal | ICD-10-CM

## 2015-06-28 LAB — BASIC METABOLIC PANEL
ANION GAP: 8 (ref 5–15)
BUN: 20 mg/dL (ref 6–20)
CALCIUM: 9.1 mg/dL (ref 8.9–10.3)
CHLORIDE: 105 mmol/L (ref 101–111)
CO2: 26 mmol/L (ref 22–32)
CREATININE: 1.33 mg/dL — AB (ref 0.61–1.24)
GFR calc non Af Amer: 53 mL/min — ABNORMAL LOW (ref >60)
Glucose, Bld: 86 mg/dL (ref 65–99)
Potassium: 4.1 mmol/L (ref 3.5–5.1)
SODIUM: 139 mmol/L (ref 135–145)

## 2015-06-28 LAB — URINALYSIS, ROUTINE W REFLEX MICROSCOPIC
Bilirubin Urine: NEGATIVE
GLUCOSE, UA: NEGATIVE mg/dL
Hgb urine dipstick: NEGATIVE
Ketones, ur: NEGATIVE mg/dL
LEUKOCYTES UA: NEGATIVE
Nitrite: NEGATIVE
PROTEIN: NEGATIVE mg/dL
SPECIFIC GRAVITY, URINE: 1.024 (ref 1.005–1.030)
pH: 7 (ref 5.0–8.0)

## 2015-06-28 LAB — MAGNESIUM: MAGNESIUM: 1.8 mg/dL (ref 1.7–2.4)

## 2015-06-28 MED ORDER — METOPROLOL TARTRATE 25 MG PO TABS: 25.0000 mg | ORAL_TABLET | Freq: Two times a day (BID) | ORAL | Status: DC

## 2015-06-28 MED ORDER — AMIODARONE HCL 200 MG PO TABS
200.0000 mg | ORAL_TABLET | Freq: Every day | ORAL | Status: DC
Start: 2015-06-28 — End: 2015-07-26

## 2015-06-28 NOTE — Discharge Summary (Signed)
ELECTROPHYSIOLOGY PROCEDURE DISCHARGE SUMMARY    Patient ID: Erik [name],  MRN: [id number], DOB/AGE: 08-31-1945 70 y.o.  Admit date: 06/26/2015 Discharge date: 06/28/2015  Primary Care Physician: [name], MD Primary Cardiologist: Dr. [name] Electrophysiologist: Dr. Curt Bears  Primary Discharge Diagnosis:  Symptomatic bradycardia status post pacemaker implantation this admission  Secondary Discharge Diagnosis:  1. PAfib     CHA2DS2Vasc is at least 2 on Xarelto 2. HTN 3. HLD  No Known Allergies   Procedures This Admission:  1.  Implantation of a STJ, dual chamber PPM on 06/27/15 by Dr Lovena Le.  The patient received a [employer] Jude (serial number [id number] pacemaker and [employer] Jude (serial number [id number]) right atrial lead and a St. Jude (serial number [id number]) right ventricular lead  There were no immediate post procedure complications. 2.  CXR on 06/28/15 demonstrated no pneumothorax status post device implantation.   Brief HPI: Erik [name] is a 70 y.o. male was admitted 06/26/15 with dizziness, noted to have prolonged QT and his Tiklosyn held, and bradycardia with rates 40's-50's, he was admitted for further monitoring.  Hospital Course:  The patient was admitted to telemetry bed, his Tikosyn held for prolonged QT, he went into AFib with RVR and was given bolus diltiazem with 3 prolongedr pauses of up to 7 seconds, his post termination pause 6.9 seconds.  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 Curt Bears was decide to to pursue PPM implant.  He underwent implantation of a PPM with details as outlined above. He was monitored on telemetry overnight which demonstrated A pacing, V sensing.  Left chest was without hematoma or ecchymosis.  The device was interrogated and found to be functioning normally.  CXR was  obtained and demonstrated no pneumothorax status post device implantation.  Wound care, arm mobility, and restrictions were reviewed with the patient.  The patient was examined by Dr. Curt Bears and considered stable for discharge to home. It has been > 48 hours since his last Tikosyn dose with resolution of his prolonged QT, we Hardeep Reetz start today amiodarone 400mg  BID for one week, the 200mg  daily.  He Calayah [name] keep his appt with AF clinic Friday.   Physical Exam: Filed Vitals:   06/28/15 0500 06/28/15 0640 06/28/15 0832 06/28/15 0900  BP: 166/82 138/75 148/72   Pulse: 68     Temp: 97.7 F (36.5 C)     TempSrc: Oral     Resp: 18     Height:    5\' 11"  (1.803 m)  Weight: 243 lb 1.6 oz (110.269 kg)     SpO2: 96%       GEN- The patient is well appearing, alert and oriented x 3 today.   HEENT: normocephalic, atraumatic; sclera clear, conjunctiva pink; hearing intact; oropharynx clear; neck supple, no JVP Lungs- Clear to ausculation bilaterally, normal work of breathing.  No wheezes, rales, rhonchi Heart- Regular rate and rhythm, no murmurs, rubs or gallops, PMI not laterally displaced GI- soft, non-tender, non-distended, bowel sounds present, no hepatosplenomegaly Extremities- no clubbing, cyanosis, or edema MS- no significant deformity or atrophy Skin- warm and dry, no rash or lesion, left chest without hematoma, minimal ecchymosis Psych- euthymic mood, full affect Neuro- no gross deficits   Labs:   Lab Results  Component Value Date   WBC 5.8 06/26/2015   HGB 13.7 06/26/2015  HCT 40.5 06/26/2015   MCV 89.6 06/26/2015   PLT 168 06/26/2015     Recent Labs Lab 06/28/15 0522  NA 139  K 4.1  CL 105  CO2 26  BUN 20  CREATININE 1.33*  CALCIUM 9.1  GLUCOSE 86    Discharge Medications:    Medication List    STOP taking these medications        dofetilide 500 MCG capsule  Commonly known as:  TIKOSYN      TAKE these medications        amiodarone 200 MG tablet  Commonly  known as:  PACERONE  Take 1 tablet (200 mg total) by mouth daily. Take 400mg  (2 tabs) by mouth twice daily for one week, then take 200mg  (1 tab) by mouth daily     hydrocortisone 25 MG suppository  Commonly known as:  ANUSOL-HC  Place 1 suppository (25 mg total) rectally at bedtime as needed for hemorrhoids or itching. One suppository per rectum at night as needed.     ibuprofen 200 MG tablet  Commonly known as:  ADVIL,MOTRIN  Take 600 mg by mouth 3 (three) times daily as needed (pain).     levothyroxine 75 MCG tablet  Commonly known as:  SYNTHROID, LEVOTHROID  Take 1 tablet (75 mcg total) by mouth daily.     losartan 100 MG tablet  Commonly known as:  COZAAR  Take 1 tablet (100 mg total) by mouth daily.     metoprolol tartrate 25 MG tablet  Commonly known as:  LOPRESSOR  Take 1 tablet (25 mg total) by mouth 2 (two) times daily.     multivitamin with minerals tablet  Take 1 tablet by mouth daily.     simvastatin 20 MG tablet  Commonly known as:  ZOCOR  Take 1 tablet (20 mg total) by mouth daily.     XARELTO 20 MG Tabs tablet  Generic drug:  rivaroxaban  TAKE 1 TABLET (20 MG TOTAL) BY MOUTH DAILY WITH SUPPER.        Disposition: Home Discharge Instructions    Diet - low sodium heart healthy    Complete by:  As directed      Increase activity slowly    Complete by:  As directed           Follow-up Information    Follow up with [facility] On 07/06/2015.   Specialty:  Cardiology   Why:  11:30AM, wound check   Contact information:   [address] [place] [phone]      Follow up with Iantha Titsworth [name], MD On 07/26/2015.   Specialty:  Cardiology   Why:  11:15AM   Contact information:   [place] [address] [phone]       Follow up with [facility] On 07/01/2015.   Specialty:  Cardiology   Why:  10:00AM   Contact information:   [address] [id number] [place] [phone]      Duration of Discharge Encounter: Greater than 30 minutes including physician time.  Venetia Night, PA-C 06/28/2015 9:52 AM    I have seen and examined this patient with Tommye Standard.  Agree with above, note added to reflect my findings.  On exam, regular rhythm, no murmurs, lungs clear.  Had dual chamber pacemaker placed for sick sinus syndrome.  Placed on amiodarone for atrial fibrillation as he went into  AF with tikosyn.  Attilio Zeitler plan for follow up in AF clinic.   Follow up in device clinic in 10 days.   Shadi Sessler M. Lossie Kalp MD 06/28/2015 10:18 AM

## 2015-06-28 NOTE — Discharge Instructions (Signed)
° ° °  Supplemental Discharge Instructions for  Pacemaker/Defibrillator Patients  Activity No heavy lifting or vigorous activity with your left/right arm for 6 to 8 weeks.  Do not raise your left/right arm above your head for one week.  Gradually raise your affected arm as drawn below.           __  NO DRIVING for  1 week   ; you may begin driving on  [id number]   .  WOUND CARE - Keep the wound area clean and dry.  Do not get this area wet for one week. No showers for one week; you may shower on 07/04/15   . - The tape/steri-strips on your wound will fall off; do not pull them off.  No bandage is needed on the site.  DO  NOT apply any creams, oils, or ointments to the wound area. - If you notice any drainage or discharge from the wound, any swelling or bruising at the site, or you develop a fever > 101? F after you are discharged home, call the office at once.  Special Instructions - You are still able to use cellular telephones; use the ear opposite the side where you have your pacemaker/defibrillator.  Avoid carrying your cellular phone near your device. - When traveling through airports, show security personnel your identification card to avoid being screened in the metal detectors.  Ask the security personnel to use the hand wand. - Avoid arc welding equipment, MRI testing (magnetic resonance imaging), TENS units (transcutaneous nerve stimulators).  Call the office for questions about other devices. - Avoid electrical appliances that are in poor condition or are not properly grounded. - Microwave ovens are safe to be near or to operate.  Additional information for defibrillator patients should your device go off: - If your device goes off ONCE and you feel fine afterward, notify the device clinic nurses. - If your device goes off ONCE and you do not feel well afterward, call 911. - If your device goes off TWICE, call 911. - If your device goes off THREE times in one day, call 911.  DO NOT  DRIVE YOURSELF OR A FAMILY MEMBER WITH A DEFIBRILLATOR TO THE HOSPITAL--CALL 911.    You have an appointment set up with the [facility].  Multiple studies have shown that being followed by a dedicated atrial fibrillation clinic in addition to the standard care you receive from your other physicians improves health. We believe that enrollment in the atrial fibrillation clinic will allow [place] to better care for you.   The phone number to the [facility] is [phone]. The clinic is staffed Monday through Friday from 8:30am to 5pm.  Parking Directions: The clinic is located in the Heart and Vascular Building connected to [facility]. 1)From [address] turn on to [address] and go to the 3rd entrance  (Heart and Vascular entrance) on the right. 2)Look to the right for Heart &Vascular Parking Garage. 3)A code for the entrance is required as will be 0002 the day of your appointment.   4)Take the elevators to the 1st floor. Registration is in the room with the glass walls at the end of the hallway.  If you have any trouble parking or locating the clinic, please dont hesitate to call [phone].

## 2015-06-29 ENCOUNTER — Ambulatory Visit: Payer: Medicare Other | Admitting: Cardiovascular Disease

## 2015-07-01 ENCOUNTER — Telehealth: Payer: Self-pay | Admitting: *Deleted

## 2015-07-01 ENCOUNTER — Inpatient Hospital Stay (HOSPITAL_COMMUNITY): Admit: 2015-07-01 | Payer: Medicare Other | Admitting: Nurse Practitioner

## 2015-07-01 ENCOUNTER — Encounter (HOSPITAL_COMMUNITY): Payer: Self-pay | Admitting: Nurse Practitioner

## 2015-07-01 ENCOUNTER — Ambulatory Visit (HOSPITAL_COMMUNITY)
Admit: 2015-07-01 | Discharge: 2015-07-01 | Disposition: A | Discharge status: Home / Self Care | Payer: Medicare Other | Attending: Nurse Practitioner | Admitting: Nurse Practitioner

## 2015-07-01 VITALS — BP 150/88 | HR 142 | Ht 71.0 in | Wt 244.8 lb

## 2015-07-01 DIAGNOSIS — Z0189 Encounter for other specified special examinations: Secondary | ICD-10-CM | POA: Diagnosis present

## 2015-07-01 DIAGNOSIS — Z7901 Long term (current) use of anticoagulants: Secondary | ICD-10-CM | POA: Insufficient documentation

## 2015-07-01 DIAGNOSIS — G8929 Other chronic pain: Secondary | ICD-10-CM | POA: Insufficient documentation

## 2015-07-01 DIAGNOSIS — I119 Hypertensive heart disease without heart failure: Secondary | ICD-10-CM | POA: Diagnosis not present

## 2015-07-01 DIAGNOSIS — I2510 Atherosclerotic heart disease of native coronary artery without angina pectoris: Secondary | ICD-10-CM | POA: Insufficient documentation

## 2015-07-01 DIAGNOSIS — M545 Low back pain: Secondary | ICD-10-CM | POA: Diagnosis not present

## 2015-07-01 DIAGNOSIS — I481 Persistent atrial fibrillation: Secondary | ICD-10-CM | POA: Diagnosis not present

## 2015-07-01 DIAGNOSIS — I4891 Unspecified atrial fibrillation: Secondary | ICD-10-CM | POA: Insufficient documentation

## 2015-07-01 DIAGNOSIS — Z87891 Personal history of nicotine dependence: Secondary | ICD-10-CM | POA: Diagnosis not present

## 2015-07-01 DIAGNOSIS — I480 Paroxysmal atrial fibrillation: Secondary | ICD-10-CM | POA: Insufficient documentation

## 2015-07-01 DIAGNOSIS — I4819 Other persistent atrial fibrillation: Secondary | ICD-10-CM

## 2015-07-01 DIAGNOSIS — K648 Other hemorrhoids: Secondary | ICD-10-CM | POA: Insufficient documentation

## 2015-07-01 DIAGNOSIS — E785 Hyperlipidemia, unspecified: Secondary | ICD-10-CM | POA: Diagnosis not present

## 2015-07-01 DIAGNOSIS — M109 Gout, unspecified: Secondary | ICD-10-CM | POA: Diagnosis not present

## 2015-07-01 MED ORDER — METOPROLOL TARTRATE 25 MG PO TABS: 50.0000 mg | ORAL_TABLET | Freq: Two times a day (BID) | ORAL | Status: DC

## 2015-07-01 MED ORDER — METOPROLOL TARTRATE 25 MG PO TABS: 25.0000 mg | ORAL_TABLET | Freq: Two times a day (BID) | ORAL | Status: DC

## 2015-07-01 NOTE — Telephone Encounter (Signed)
Spoke to patient's wife, Erik [name], about device site. Wife states that the patient does have some edema and ecchymosis of the pocket. I informed her that being that the patient is taking Xarelto it is not uncommon for him to have some ecchymosis and edema of the area.  I explained to her that the ecchymosis will probably migrate down his chest due to gravity and turn from dark purple to yellow before completely going away. I told her that as long as the swelling appears to be getting better rather than worse then it should be fine. I told her that it could take up to 4-6 weeks before it has completely gone away.   I asked wife if she would like to bring her husband into the office for a wound check today to just assess the area. Wife declined, stating that it looks better this morning.   Patient to f/u as scheduled.   I encouraged wife to call back if any problems arise.   Wife voiced understanding.

## 2015-07-01 NOTE — Progress Notes (Signed)
Patient ID: Erik [name], male   DOB: December 02, 1945, 70 y.o.   MRN: [id number]     Primary Care Physician: [name], MD Referring Physician: Dr. Thereasa Distance Erik [name] is a 70 y.o. male with a h/o PAF,admitted and Tikosyn was initiated. Renal function and electrolytes were followed during the hospitalization. Their QTc remained stable. He converted to SR spontaneously. They were monitored until discharge on telemetry which demonstrated sinus rhythm. On the day of discharge, they were examined by Dr Curt Bears who considered them stable for discharge to home.  He was readmitted 06/26/15 with dizziness, noted to have prolonged QT and his Tiklosyn held, and bradycardia with rates 40's-50's, he was admitted for further monitoring.admitted to telemetry bed, his Tikosyn held for prolonged QT, he went into AFib with RVR and was given bolus diltiazem with 3 prolongedr pauses of up to 7 seconds, his post termination pause 6.9 seconds. 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 Curt Bears was decide to to pursue PPM implant. He underwent implantation of a PPM with details as outlined above. He was monitored on telemetry overnight which demonstrated A pacing, V sensing. Left chest was without hematoma or ecchymosis. The device was interrogated and found to be functioning normally. CXR was obtained and demonstrated no pneumothorax status post device implantation. Wound care, arm mobility, and restrictions were reviewed with the patient. The patient was examined by Dr. Curt Bears and considered stable for discharge to home. It has been > 48 hours since his last Tikosyn dose with resolution of his prolonged QT, we will start today amiodarone 400mg  BID for one week, the 200mg  daily. He will keep his appt with AF clinic Friday.  In afib  clinic, pt does not feel quite as well today. He is in aflutter with rapid AV block at 142 bpm, however after he rested for awhile, his HR was around upper 90's. He has not taken am metoprolol or amiodarone yet this am. He usually waits to take his medicine around 10 am. The wife reports that he left the hospital in [place].Device site check reveals old bruising around the site, with some mild swelling around the device. Wife stastes that this is unchanged/ improved since d/c. Steri strips in place. Pt overall feels ok.  Today, he denies symptoms of palpitations, chest pain, shortness of breath, orthopnea, PND, lower extremity edema, dizziness, presyncope, syncope, or neurologic sequela. The patient is tolerating medications without difficulties and is otherwise without complaint today.   Past Medical History  Diagnosis Date  . Hyperlipidemia   . Hypertensive heart disease   . Gout   . Chronic lower back pain   . Paroxysmal atrial fibrillation ([place])     a. Dx 04/2014; b. 05/2014 Echo: Ef 60-65%, no rwma, triv MR/TR, nl RV; c. CHA2DS2VASc = 3-->was on eliquis, switched to xarelto 04/2015 2/2 cost.  . Diverticulosis   . Internal hemorrhoids   . BPH with urinary obstruction   . Nephrolithiasis   . Hypothyroidism   . Non-obstructive CAD     a. 07/2002 Cath: LM 20, LAD 38m/d, LCX 50-62m, OM1 54m, RCA 26m, EF 60%; b. 05/2014 MV: low risk w/ small sized, mild intensity rev defect in apical/inferior/infsept area, nl EF->Med Rx.  . Carotid disease, bilateral ([place])     a. 09/2014 Carotid U/S: 1-39% bilat ICA stenosis.  Past Surgical History  Procedure Laterality Date  . Lumbar disc surgery  1998    2 lumbar discs, Dr. Ellene Route   . Hand surgery Right   . Colonoscopy  04-24-05    per Dr. Henrene Pastor, clear, repeat 10 yrs  . Ep implantable device N/A 06/27/2015    Procedure: Pacemaker Implant;  Surgeon: Evans Lance, MD;  Location: [facility];  Service: Cardiovascular;  Laterality: N/A;    Current  Outpatient Prescriptions  Medication Sig Dispense Refill  . acetaminophen (TYLENOL) 500 MG tablet Take 1,000 mg by mouth every 6 (six) hours as needed.    [name] amiodarone (PACERONE) 200 MG tablet Take 1 tablet (200 mg total) by mouth daily. Take 400mg  (2 tabs) by mouth twice daily for one week, then take 200mg  (1 tab) by mouth daily 60 tablet 4  . levothyroxine (SYNTHROID, LEVOTHROID) 75 MCG tablet Take 1 tablet (75 mcg total) by mouth daily. 30 tablet 11  . losartan (COZAAR) 100 MG tablet Take 1 tablet (100 mg total) by mouth daily. 90 tablet 3  . metoprolol tartrate (LOPRESSOR) 25 MG tablet Take 1 tablet (25 mg total) by mouth 2 (two) times daily. 60 tablet 4  . Multiple Vitamins-Minerals (MULTIVITAMIN WITH MINERALS) tablet Take 1 tablet by mouth daily.    . simvastatin (ZOCOR) 20 MG tablet Take 1 tablet (20 mg total) by mouth daily. 90 tablet 3  . XARELTO 20 MG TABS tablet TAKE 1 TABLET (20 MG TOTAL) BY MOUTH DAILY WITH SUPPER. 30 tablet 6  . hydrocortisone (ANUSOL-HC) 25 MG suppository Place 1 suppository (25 mg total) rectally at bedtime as needed for hemorrhoids or itching. One suppository per rectum at night as needed. 30 suppository 6   No current facility-administered medications for this encounter.    No Known Allergies  Social History   Social History  . Marital Status: Married    Spouse Name: N/A  . Number of Children: 1  . Years of Education: N/A   Occupational History  . retired    Social History Main Topics  . Smoking status: Former Smoker    Types: Cigarettes    Quit date: 02/27/1995  . Smokeless tobacco: Never Used  . Alcohol Use: 0.0 oz/week    0 Standard drinks or equivalent per week     Comment: couple times a month  . Drug Use: No  . Sexual Activity: Not on file   Other Topics Concern  . Not on file   Social History Narrative    Family History  Problem Relation Age of Onset  . Dementia Mother   . Stroke Father   . Diabetes Paternal Grandmother   .  Stroke Paternal Aunt     x 2  . Cancer Paternal Aunt     type unknown    ROS- All systems are reviewed and negative except as per the HPI above  Physical Exam: Filed Vitals:   07/01/15 1006  BP: 150/88  Pulse: 142  Height: 5\' 11"  (1.803 m)  Weight: 244 lb 12.8 oz (111.041 kg)  BP recheck 130/80, HR in upper 90's  GEN- The patient is well appearing, alert and oriented x 3 today.   Head- normocephalic, atraumatic Eyes-  Sclera clear, conjunctiva pink Ears- hearing intact Oropharynx- clear Neck- supple, no JVP Lymph- no cervical lymphadenopathy Lungs- Clear to ausculation bilaterally, normal work of breathing Heart-irregular rate and rhythm, no murmurs, rubs or gallops, PMI not laterally displaced. PPM site with old bruising, mild swelling, no  redness, d/c Steri strips in place GI- soft, NT, ND, + BS Extremities- no clubbing, cyanosis, or edema MS- no significant deformity or atrophy Skin- no rash or lesion Psych- euthymic mood, full affect Neuro- strength and sensation are intact  EKG-aflutter with variable AV block, nsst/tw abnormalities, qrs int 82 ms, qtc 513 ms  Assessment and Plan: 1. Afib Symptomatic termination pauses up to 7 secs  with tikosyn with subsequent PPM and d/c of tikosyn Currently on amiodarone load, with aflutter this am, 140 v rate on presentation, rate in 90's with rest No meds of yet  this am Discussed with Dr. Curt Bears, continue amiodarone load, BB, qtc acceptable with amiodarone/flutter Continue xarelto Discussed activity precautions after pacemaker with pt  F/u in device clinic 5/10 Dr. Curt Bears 5/30  afib clinic as needed  Butch Penny C. Tandra Rosado, [facility] [address] [place], [place] [address] [phone]

## 2015-07-06 ENCOUNTER — Encounter: Payer: Self-pay | Admitting: Cardiology

## 2015-07-06 ENCOUNTER — Telehealth: Payer: Self-pay | Admitting: *Deleted

## 2015-07-06 ENCOUNTER — Ambulatory Visit (INDEPENDENT_AMBULATORY_CARE_PROVIDER_SITE_OTHER): Payer: Medicare Other | Admitting: *Deleted

## 2015-07-06 DIAGNOSIS — I480 Paroxysmal atrial fibrillation: Secondary | ICD-10-CM | POA: Diagnosis not present

## 2015-07-06 LAB — CUP PACEART INCLINIC DEVICE CHECK
Battery Remaining Longevity: 115.2
Brady Statistic RA Percent Paced: 53 %
Brady Statistic RV Percent Paced: 6.1 %
Date Time Interrogation Session: 20170510125606
Implantable Lead Implant Date: 20170501
Lead Channel Impedance Value: 600 Ohm
Lead Channel Pacing Threshold Pulse Width: 0.5 ms
Lead Channel Pacing Threshold Pulse Width: 0.5 ms
Lead Channel Sensing Intrinsic Amplitude: 12 mV
Lead Channel Setting Pacing Amplitude: 0.75 V
Lead Channel Setting Pacing Amplitude: 1.75 V
Lead Channel Setting Sensing Sensitivity: 2 mV
MDC IDC LEAD IMPLANT DT: 20170501
MDC IDC LEAD LOCATION: 753859
MDC IDC LEAD LOCATION: 753860
MDC IDC MSMT BATTERY VOLTAGE: 3.07 V
MDC IDC MSMT LEADCHNL RA IMPEDANCE VALUE: 525 Ohm
MDC IDC MSMT LEADCHNL RA PACING THRESHOLD AMPLITUDE: 0.75 V
MDC IDC MSMT LEADCHNL RA PACING THRESHOLD AMPLITUDE: 0.75 V
MDC IDC MSMT LEADCHNL RA PACING THRESHOLD PULSEWIDTH: 0.5 ms
MDC IDC MSMT LEADCHNL RA SENSING INTR AMPL: 5 mV
MDC IDC MSMT LEADCHNL RV PACING THRESHOLD AMPLITUDE: 0.5 V
MDC IDC MSMT LEADCHNL RV PACING THRESHOLD AMPLITUDE: 0.5 V
MDC IDC MSMT LEADCHNL RV PACING THRESHOLD PULSEWIDTH: 0.5 ms
MDC IDC PG SERIAL: 7897836
MDC IDC SET LEADCHNL RV PACING PULSEWIDTH: 0.5 ms
Pulse Gen Model: 2272

## 2015-07-06 MED ORDER — RIVAROXABAN 20 MG PO TABS: 20.0000 mg | ORAL_TABLET | Freq: Every day | ORAL | Status: DC

## 2015-07-06 MED ORDER — LOSARTAN POTASSIUM-HCTZ 100-25 MG PO TABS: 1.0000 | ORAL_TABLET | Freq: Every day | ORAL | Status: DC

## 2015-07-06 NOTE — Telephone Encounter (Signed)
Per note from today: Dr.Klein told patient/wife that he should resume his Hyzaar and discontinue the Cozaar and get a BMP at upcoming appt. Patient/wife voiced understanding. Patient/wife encouraged to call if they need refills. Ok to refill as previous hyzaar 100-25mg  and d/c losartan? Wanted to be sure as she requested to speak with you to run this by Dr Baird Kay and also due to the following  Erik [name], [facility] at 06/17/2015 10:25 AM     Status: Signed       Expand All Collapse All   Spoke with pt. Called to set up date for [facility] admission. He has not missed any doses of Xarelto in the past month. He is taking losartan/HCTZ. Will change to losartan only. New Rx sent to pharmacy. Pt will come to office on 4/25 for admission.          Please advise. Thanks, MI

## 2015-07-06 NOTE — Progress Notes (Signed)
Wound check appointment. Steri-strips removed. Wound without redness. Incision edges approximated, wound well healed. Hematoma is present and firm. The ecchymosis is now yellow/green around the site. Dr.Klein assessed area and recommended no changes at this time. Patient encouraged to call if hematoma increases in size.  Patient/wife also had questions about patient's systolic Bp measurements. Readings have been between 150-136mmhg. Dr.Klein told patient/wife that he should resume his Hyzaar and discontinue the Cozaar and get a BMP at upcoming appt. Patient/wife voiced understanding. Patient/wife encouraged to call if they need refills.  Pacemaker checked in clinic. Normal device function. Thresholds, sensing, and impedances consistent with implant measurements. Auto capture was programmed on at implant. Histogram distribution appropriate for patient and level of activity. (1) AF episode recorded x 3days 19hrs + Xarelto. (1) high ventricular rate noted--AF/RVR. Patient educated about wound care, arm mobility, lifting restrictions. ROV on 5/30 with WC.

## 2015-07-06 NOTE — Telephone Encounter (Signed)
Ok to fill Hyzaar and d/c Losartan. Patient is not on Tikosyn anymore. Spoke with pt and his wife who are aware of changes. They are agreeable to plan and understand rx will be sent to pharmacy.

## 2015-07-08 ENCOUNTER — Ambulatory Visit (INDEPENDENT_AMBULATORY_CARE_PROVIDER_SITE_OTHER): Payer: Medicare Other | Admitting: *Deleted

## 2015-07-08 ENCOUNTER — Telehealth: Payer: Self-pay | Admitting: Cardiology

## 2015-07-08 DIAGNOSIS — I480 Paroxysmal atrial fibrillation: Secondary | ICD-10-CM

## 2015-07-08 NOTE — Progress Notes (Signed)
Patient presents to the office w/ c/o bleeding from his device site. Small amount of dark blood oozed from the middle of incision area. I spoke with Dr.Taylor about the bleeding and Dr.Taylor recommended that patient hold his Xarelto and f/u with the Device clinic in a week for a wound recheck. Information relayed to patient.   Patient to follow up on 5/19 @ 1000.  Area was dressed with large bandage. Extra bandages given to patient for daily changes until next follow up.

## 2015-07-08 NOTE — Telephone Encounter (Signed)
New Message  Pt wife calling to speak w/ Device- stated that wound site is bleeding. Please call back and discuss.

## 2015-07-08 NOTE — Telephone Encounter (Signed)
Spoke with wife about patient's wound. She said that it is now "oozing" blood out of one of the corners of the incision.   Appt made today for a wound recheck. Wife voiced understanding.

## 2015-07-15 ENCOUNTER — Emergency Department (HOSPITAL_COMMUNITY): Payer: Medicare Other

## 2015-07-15 ENCOUNTER — Encounter (HOSPITAL_COMMUNITY): Payer: Self-pay | Admitting: Emergency Medicine

## 2015-07-15 ENCOUNTER — Ambulatory Visit (INDEPENDENT_AMBULATORY_CARE_PROVIDER_SITE_OTHER): Payer: Medicare Other | Admitting: *Deleted

## 2015-07-15 ENCOUNTER — Emergency Department (HOSPITAL_COMMUNITY)
Admission: EM | Admit: 2015-07-15 | Discharge: 2015-07-15 | Disposition: A | Discharge status: Home / Self Care | Payer: Medicare Other | Attending: Emergency Medicine | Admitting: Emergency Medicine

## 2015-07-15 DIAGNOSIS — I119 Hypertensive heart disease without heart failure: Secondary | ICD-10-CM | POA: Diagnosis not present

## 2015-07-15 DIAGNOSIS — N183 Chronic kidney disease, stage 3 unspecified: Secondary | ICD-10-CM | POA: Insufficient documentation

## 2015-07-15 DIAGNOSIS — R079 Chest pain, unspecified: Secondary | ICD-10-CM | POA: Insufficient documentation

## 2015-07-15 DIAGNOSIS — R072 Precordial pain: Secondary | ICD-10-CM | POA: Diagnosis not present

## 2015-07-15 DIAGNOSIS — Z79899 Other long term (current) drug therapy: Secondary | ICD-10-CM | POA: Insufficient documentation

## 2015-07-15 DIAGNOSIS — Z950 Presence of cardiac pacemaker: Secondary | ICD-10-CM

## 2015-07-15 DIAGNOSIS — I495 Sick sinus syndrome: Secondary | ICD-10-CM | POA: Diagnosis not present

## 2015-07-15 DIAGNOSIS — I480 Paroxysmal atrial fibrillation: Secondary | ICD-10-CM | POA: Diagnosis not present

## 2015-07-15 DIAGNOSIS — E039 Hypothyroidism, unspecified: Secondary | ICD-10-CM | POA: Insufficient documentation

## 2015-07-15 DIAGNOSIS — Z87442 Personal history of urinary calculi: Secondary | ICD-10-CM | POA: Diagnosis not present

## 2015-07-15 DIAGNOSIS — Z87438 Personal history of other diseases of male genital organs: Secondary | ICD-10-CM | POA: Diagnosis not present

## 2015-07-15 DIAGNOSIS — G8929 Other chronic pain: Secondary | ICD-10-CM | POA: Insufficient documentation

## 2015-07-15 DIAGNOSIS — E785 Hyperlipidemia, unspecified: Secondary | ICD-10-CM | POA: Insufficient documentation

## 2015-07-15 DIAGNOSIS — Z87891 Personal history of nicotine dependence: Secondary | ICD-10-CM | POA: Diagnosis not present

## 2015-07-15 DIAGNOSIS — I2510 Atherosclerotic heart disease of native coronary artery without angina pectoris: Secondary | ICD-10-CM | POA: Diagnosis not present

## 2015-07-15 DIAGNOSIS — Z7901 Long term (current) use of anticoagulants: Secondary | ICD-10-CM

## 2015-07-15 DIAGNOSIS — Z8739 Personal history of other diseases of the musculoskeletal system and connective tissue: Secondary | ICD-10-CM | POA: Diagnosis not present

## 2015-07-15 DIAGNOSIS — Z8719 Personal history of other diseases of the digestive system: Secondary | ICD-10-CM | POA: Diagnosis not present

## 2015-07-15 DIAGNOSIS — R61 Generalized hyperhidrosis: Secondary | ICD-10-CM | POA: Insufficient documentation

## 2015-07-15 DIAGNOSIS — T82837D Hemorrhage of cardiac prosthetic devices, implants and grafts, subsequent encounter: Secondary | ICD-10-CM

## 2015-07-15 HISTORY — DX: Unspecified atrial flutter: I48.92

## 2015-07-15 HISTORY — DX: Chronic kidney disease, stage 3 unspecified: N18.30

## 2015-07-15 HISTORY — DX: Chronic kidney disease, stage 3 (moderate): N18.3

## 2015-07-15 HISTORY — DX: Sick sinus syndrome: I49.5

## 2015-07-15 LAB — BASIC METABOLIC PANEL
Anion gap: 7 (ref 5–15)
BUN: 18 mg/dL (ref 6–20)
CALCIUM: 9.4 mg/dL (ref 8.9–10.3)
CO2: 28 mmol/L (ref 22–32)
Chloride: 104 mmol/L (ref 101–111)
Creatinine, Ser: 1.51 mg/dL — ABNORMAL HIGH (ref 0.61–1.24)
GFR, EST AFRICAN AMERICAN: 53 mL/min — AB (ref >60)
GFR, EST NON AFRICAN AMERICAN: 46 mL/min — AB (ref >60)
Glucose, Bld: 107 mg/dL — ABNORMAL HIGH (ref 65–99)
Potassium: 4 mmol/L (ref 3.5–5.1)
SODIUM: 139 mmol/L (ref 135–145)

## 2015-07-15 LAB — PROTIME-INR
INR: 1.11 (ref 0.00–1.49)
PROTHROMBIN TIME: 14.5 s (ref 11.6–15.2)

## 2015-07-15 LAB — CBC
HCT: 38.7 % — ABNORMAL LOW (ref 39.0–52.0)
HEMOGLOBIN: 12.9 g/dL — AB (ref 13.0–17.0)
MCH: 29.1 pg (ref 26.0–34.0)
MCHC: 33.3 g/dL (ref 30.0–36.0)
MCV: 87.4 fL (ref 78.0–100.0)
Platelets: 194 10*3/uL (ref 150–400)
RBC: 4.43 MIL/uL (ref 4.22–5.81)
RDW: 12.8 % (ref 11.5–15.5)
WBC: 8.5 10*3/uL (ref 4.0–10.5)

## 2015-07-15 LAB — I-STAT TROPONIN, ED: TROPONIN I, POC: 0 ng/mL (ref 0.00–0.08)

## 2015-07-15 LAB — TROPONIN I: Troponin I: <0.03 ng/mL (ref <0.031)

## 2015-07-15 MED ORDER — NITROGLYCERIN 0.4 MG SL SUBL: 0.4000 mg | SUBLINGUAL_TABLET | SUBLINGUAL | Status: DC | PRN

## 2015-07-15 NOTE — ED Notes (Signed)
Patient has returned from being out of the department; patient placed back on monitor, continuous pulse oximetry and blood pressure cuff; visitor at bedside

## 2015-07-15 NOTE — ED Provider Notes (Signed)
CSN: [id number]     Arrival date & time 07/15/15  1057 History   First MD Initiated Contact with Patient 07/15/15 1108     Chief Complaint  Patient presents with  . Chest Pain     (Consider location/radiation/quality/duration/timing/severity/associated sxs/prior Treatment) HPI  Is a 70 year old man with a history of A. fib status post pacemaker placement who arrives today after presenting to the office to have his pacemaker site rechecked at which time he complained of chest pain and EMS was called. EMS states that he appeared to have severe pain and was diaphoretic on their arrival. He received aspirin and Zofran with complete pain relief. He states that the pain began on his way into the cardiology office. He describes it as substernal in nature and pounding and severe. He had associated nausea and diaphoresis. He denies dyspnea or radiation of pain. He had some similar pain in the past with his A. fib with RVR. He was on Xarelto until last Friday when it was stopped due to some bleeding at the pacemaker pocket site. Per EMS his rate has been 60 from their arrival to here. He was not hypotensive prehospital. Cardiac cath 2004 with nonobstructive coronary artery disease.  Past Medical History  Diagnosis Date  . Hyperlipidemia   . Hypertensive heart disease   . Gout   . Chronic lower back pain   . Paroxysmal atrial fibrillation ([place])     a. Dx 04/2014; b. 05/2014 Echo: Ef 60-65%, no rwma, triv MR/TR, nl RV; c. CHA2DS2VASc = 3-->was on eliquis, switched to xarelto 04/2015 2/2 cost.  . Diverticulosis   . Internal hemorrhoids   . BPH with urinary obstruction   . Nephrolithiasis   . Hypothyroidism   . Non-obstructive CAD     a. 07/2002 Cath: LM 20, LAD 36m/d, LCX 50-10m, OM1 30m, RCA 30m, EF 60%; b. 05/2014 MV: low risk w/ small sized, mild intensity rev defect in apical/inferior/infsept area, nl EF->Med Rx.  . Carotid disease, bilateral ([place])     a. 09/2014 Carotid U/S: 1-39% bilat ICA stenosis.    Past Surgical History  Procedure Laterality Date  . Lumbar disc surgery  1998    2 lumbar discs, Dr. Ellene Route   . Hand surgery Right   . Colonoscopy  04-24-05    per Dr. Henrene Pastor, clear, repeat 10 yrs  . Ep implantable device N/A 06/27/2015    Procedure: Pacemaker Implant;  Surgeon: Evans Lance, MD;  Location: [facility];  Service: Cardiovascular;  Laterality: N/A;   Family History  Problem Relation Age of Onset  . Dementia Mother   . Stroke Father   . Diabetes Paternal Grandmother   . Stroke Paternal Aunt     x 2  . Cancer Paternal Aunt     type unknown   Social History  Substance Use Topics  . Smoking status: Former Smoker    Types: Cigarettes    Quit date: 02/27/1995  . Smokeless tobacco: Never Used  . Alcohol Use: 0.0 oz/week    0 Standard drinks or equivalent per week     Comment: couple times a month    Review of Systems  All other systems reviewed and are negative.     Allergies  Review of patient's allergies indicates no known allergies.  Home Medications   Prior to Admission medications   Medication Sig Start Date End Date Taking? Authorizing Provider  acetaminophen (TYLENOL) 500 MG tablet Take 1,000 mg by mouth every 6 (six) hours  as needed. Reported on 07/06/2015    Historical Provider, MD  amiodarone (PACERONE) 200 MG tablet Take 1 tablet (200 mg total) by mouth daily. Take 400mg  (2 tabs) by mouth twice daily for one week, then take 200mg  (1 tab) by mouth daily 06/28/15   Renee Dyane Dustman, PA-C  hydrocortisone (ANUSOL-HC) 25 MG suppository Place 1 suppository (25 mg total) rectally at bedtime as needed for hemorrhoids or itching. One suppository per rectum at night as needed. 11/17/14   Irene Shipper, MD  ibuprofen (ADVIL,MOTRIN) 200 MG tablet Take 200 mg by mouth every 8 (eight) hours as needed.    Historical Provider, MD  levothyroxine (SYNTHROID, LEVOTHROID) 75 MCG tablet Take 1 tablet (75 mcg total) by mouth daily. 09/07/14   [name], MD   losartan-hydrochlorothiazide (HYZAAR) 100-25 MG tablet Take 1 tablet by mouth daily. 07/06/15   Deboraha Sprang, MD  metoprolol tartrate (LOPRESSOR) 25 MG tablet Take 1 tablet (25 mg total) by mouth 2 (two) times daily. 07/01/15   Sherran Needs, NP  Multiple Vitamins-Minerals (MULTIVITAMIN WITH MINERALS) tablet Take 1 tablet by mouth daily.    Historical Provider, MD  rivaroxaban (XARELTO) 20 MG TABS tablet Take 1 tablet (20 mg total) by mouth daily with supper. 07/06/15   Will [name], MD  simvastatin (ZOCOR) 20 MG tablet Take 1 tablet (20 mg total) by mouth daily. 09/21/14   [name], MD   BP 141/77 mmHg  Pulse 66  Temp(Src) 98 F (36.7 C) (Oral)  Resp 14  SpO2 100% Physical Exam  Constitutional: He is oriented to person, place, and time. He appears well-developed and well-nourished.  Patient initially appears unwell with some diaphoresis and pale Patient is beginning to have better color by the end of my exam. More diaphoresis is noted  HENT:  Head: Normocephalic and atraumatic.  Right Ear: External ear normal.  Left Ear: External ear normal.  Nose: Nose normal.  Mouth/Throat: Oropharynx is clear and moist.  Eyes: Conjunctivae and EOM are normal. Pupils are equal, round, and reactive to light.  Neck: Normal range of motion. Neck supple.  Cardiovascular: Normal rate, regular rhythm, normal heart sounds and intact distal pulses.   Pulmonary/Chest: Effort normal and breath sounds normal. No respiratory distress. He has no wheezes. He exhibits no tenderness.  Some oozing of blood at left anterior chest to make her pocket. There is some his coloration of chest wall consistent with blood breakdown  Abdominal: Soft. Bowel sounds are normal. He exhibits no distension and no mass. There is no tenderness. There is no guarding.  Musculoskeletal: Normal range of motion.  Neurological: He is alert and oriented to person, place, and time. He has normal reflexes. He exhibits normal muscle  tone. Coordination normal.  Skin: Skin is warm and dry.  Psychiatric: He has a normal mood and affect. His behavior is normal. Judgment and thought content normal.  Nursing note and vitals reviewed.   ED Course  Procedures (including critical care time) Labs Review Labs Reviewed  BASIC METABOLIC PANEL - Abnormal; Notable for the following:    Glucose, Bld 107 (*)    Creatinine, Ser 1.51 (*)    GFR calc non Af Amer 46 (*)    GFR calc Af Amer 53 (*)    All other components within normal limits  CBC - Abnormal; Notable for the following:    Hemoglobin 12.9 (*)    HCT 38.7 (*)    All other components within normal limits  PROTIME-INR  I-STAT TROPOININ, ED  Randolm Idol, ED    Imaging Review Dg Chest 2 View  07/15/2015  CLINICAL DATA:  Centralized chest pain that started earlier today while patient was going to see his doctor, patients insertion site of his pacemaker has been bleeding X 1 week and patient was seen by his doctor X 1 week ago, EXAM: CHEST  2 VIEW COMPARISON:  06/28/2015 FINDINGS: Exam is lordotic. LEFT-sided pacemaker with 2 continuous leads overlies normal cardiac silhouette. No effusion, infiltrate or pneumothorax. IMPRESSION: No acute cardiopulmonary process. Electronically Signed   By: Suzy Bouchard M.D.   On: 07/15/2015 11:48   I have personally reviewed and evaluated these images and lab results as part of my medical decision-making.   EKG Interpretation   Date/Time:  Friday Jul 15 2015 10:59:20 EDT Ventricular Rate:  62 PR Interval:  188 QRS Duration: 99 QT Interval:  444 QTC Calculation: 451 R Axis:   49 Text Interpretation:  Normal sinus rhythm Non-specific ST-t changes  Confirmed by Johnnie Goynes MD, Andee Poles [id number]) on 07/15/2015 11:14:39 AM      MDM   Final diagnoses:  Chest pain, unspecified chest pain type    70 year old male with known coronary artery disease who presents today from cardiology office. He is being seen for recheck of bleeding at  his pacemaker site. He had chest pain that developed prior to going into their office. EKG had nonspecific ST changes. Pain had resolved prior to evaluation here. Initial troponin is negative and patient remains pain-free. He continues to have some bleeding of discoloration at the pacemaker site. Discussed with medicine who requests that cardiology be consulted prior to their consult.    Discussed with Valetta Fuller on for cardiology and they will see and evaluate 5:15 PM Patient pain free- reports awaiting repeat troponin per cardiology.   Pattricia Boss, MD 07/15/15 [phone]

## 2015-07-15 NOTE — ED Notes (Signed)
MD at bedside. 

## 2015-07-15 NOTE — ED Notes (Signed)
EMS - Patient was on his way to the DR and having left sided chest pain, non radiating and nausea.  LVA paced rhythm at a rate of 60.  Pain resolved after 324mg  Aspirin and 4mg  Zofran.  Pace maker place on May 1.  Patient was at the doctor for wound care on the pacemaker site.

## 2015-07-15 NOTE — ED Notes (Signed)
Cards at bedside

## 2015-07-15 NOTE — ED Notes (Signed)
New dressing applied to PPM site by Cards MD.

## 2015-07-15 NOTE — H&P (Signed)
History & Physical    Patient ID: IVY PLYMEL MRN: [id number], DOB/AGE: 1945/03/27   Admit date: 07/15/2015  Primary Physician: [name], Erik [name] Primary Cardiologist: Adora Fridge, Erik [name] / Elliot Cousin, Erik [name]   Patient Profile    70 y/o ? with a h/o PAF, tachy-brady syndrome, HTN, HL, CKD III, and non-obstructive CAD who presented to the ED today 2/2 chest pain.  Past Medical History    Past Medical History  Diagnosis Date  . Hyperlipidemia   . Hypertensive heart disease   . Gout   . Chronic lower back pain   . Paroxysmal atrial fibrillation ([place])     a. Dx 04/2014; b. 05/2014 Echo: Ef 60-65%, no rwma, triv MR/TR, nl RV;  c. CHA2DS2VASc = 3-->was on eliquis, switched to xarelto 04/2015 2/2 cost; d. 05/2015 Tikosyn loaded w/ conversion to AF; e. 06/2015 QTc prolongation and bradycardia->tikosyn d/c'd, PPM placed, Amio started; f. 07/01/2015 In Aflutter @ clinic f/u.  [name] Diverticulosis   . Internal hemorrhoids   . BPH with urinary obstruction   . Nephrolithiasis   . Hypothyroidism   . Non-obstructive CAD     a. 07/2002 Cath: LM 20, LAD 74m/d, LCX 50-26m, OM1 67m, RCA 91m, EF 60%; b. 05/2014 MV: low risk w/ small sized, mild intensity rev defect in apical/inferior/infsept area, nl EF->Med Rx.  . Carotid disease, bilateral ([place])     a. 09/2014 Carotid U/S: 1-39% bilat ICA stenosis.  . Paroxysmal atrial flutter ([place])     a. 06/2015 noted to be in rapid Aflutter in Afib clinic-->amio load continued.  . Tachy-brady syndrome ([place])     a. 06/27/2015 s/p SJM DC PPM (ser # @ [id number]).  . CKD (chronic kidney disease), stage III     Past Surgical History  Procedure Laterality Date  . Lumbar disc surgery  1998    2 lumbar discs, Dr. Ellene Route   . Hand surgery Right   . Colonoscopy  04-24-05    per Dr. Henrene Pastor, clear, repeat 10 yrs  . Ep implantable device N/A 06/27/2015    Procedure: Pacemaker Implant;  Surgeon: Evans Lance, Erik [name];  Location: [facility];  Service: Cardiovascular;  Laterality: N/A;      Allergies  No Known Allergies  History of Present Illness    70 year old male with the above complex past medical history. He has a history of nonobstructive coronary artery disease, hypertension, hyperlipidemia, and hypothyroidism. In March 2016, he was diagnosed with A. Fib with RVR in the setting of chest pain. He converted to sinus rhythm in the emergency room and subsequently underwent stress testing which was felt to be low risk. He has been maintained on beta blocker and oral anticoagulation therapy and recently followed up with Dr. [name] in late March secondary to intermittent dyspnea and fatigue. He was found to be in atrial fibrillation at a rate of 112 bpm. It wasn't clear if this was paroxysmal or more persistent and as result, an event monitor was placed. He was also switched off of eliquis and on to xarelto secondary to cost. He was seen in clinic in mid-April and remained in afib with occasional palpitations and also fatigue and DOE.  He was referred to EP and subsequently admitted for tikosyn loading.  He converted to sinus on tikosyn and was d/c'd after an uneventful admission.  Unfortunately, he was readmitted in late April with lightheadedness, QTc prolongation, and periodic heart block and sinus brady.  He was admitted and tikosyn was d/c'd.  Given tachy-brady syndrome, he required PPM placement.  Following PPM, he was placed on amio and d/c'd home.  When he f/u in device clinic on 5/5, he was noted to be in atrial flutter - initially @ a rate of 142, though he slowed into the 90's.  Recommendation was made for continuing amio loading.  His wife thinks that he probably converted back to sinus the following day as his HR was in the 60's.  Following pacer insertion, he did develop a hematoma, which was stable @ 5/10 clinic visit.  By 5/12 he was having oozing from the incision site and was seen again in device clinic, this time with placement of a pressure dsg and he was advised to hold  his xarelto.  Since then, his hematoma has decreased in size and the bleeding stopped earlier this week but then started up again over the past day or so.  He was scheduled to be seen in device clinic today and while his wife was driving him there, he developed severe, pounding, sscp associated with nausea and diaphoresis.  He presented to the office this AM with ongoing Ss and EMS was called prior to any evaluation of his PPM was completed.  Upon EMS arrival, he was treated with zofran with almost immediate relief of symptoms.  Total duration was between 30 and 45 minutes.  He was transferred to the ED where ECG is non-acute, initial troponin is nl, cxr non-acute.  He has had no recurrent symptoms and remains in sinus rhythm.  Home Medications    Prior to Admission medications   Medication Sig Start Date End Date Taking? Authorizing Provider  acetaminophen (TYLENOL) 500 MG tablet Take 1,000 mg by mouth every 6 (six) hours as needed. Reported on 07/06/2015   Yes Historical Provider, Erik [name]  amiodarone (PACERONE) 200 MG tablet Take 1 tablet (200 mg total) by mouth daily. Take 400mg  (2 tabs) by mouth twice daily for one week, then take 200mg  (1 tab) by mouth daily Patient taking differently: Take 200 mg by mouth daily.  06/28/15  Yes Renee Dyane Dustman, PA-C  hydrocortisone (ANUSOL-HC) 25 MG suppository Place 1 suppository (25 mg total) rectally at bedtime as needed for hemorrhoids or itching. One suppository per rectum at night as needed. 11/17/14  Yes Irene Shipper, Erik [name]  ibuprofen (ADVIL,MOTRIN) 200 MG tablet Take 200 mg by mouth every 8 (eight) hours as needed.   Yes Historical Provider, Erik [name]  levothyroxine (SYNTHROID, LEVOTHROID) 75 MCG tablet Take 1 tablet (75 mcg total) by mouth daily. 09/07/14  Yes [name], Erik [name]  losartan-hydrochlorothiazide (HYZAAR) 100-25 MG tablet Take 1 tablet by mouth daily. 07/06/15  Yes Deboraha Sprang, Erik [name]  metoprolol tartrate (LOPRESSOR) 25 MG tablet Take 1 tablet (25 mg total) by mouth  2 (two) times daily. 07/01/15  Yes Sherran Needs, Erik [name]  Multiple Vitamins-Minerals (MULTIVITAMIN WITH MINERALS) tablet Take 1 tablet by mouth daily.   Yes Historical Provider, Erik [name]  rivaroxaban (XARELTO) 20 MG TABS tablet Take 1 tablet (20 mg total) by mouth daily with supper. 07/06/15  Yes Will [name], Erik [name]  simvastatin (ZOCOR) 20 MG tablet Take 1 tablet (20 mg total) by mouth daily. 09/21/14  Yes [name], Erik [name]    Family History    Family History  Problem Relation Age of Onset  . Dementia Mother   . Stroke Father   . Diabetes Paternal Grandmother   . Stroke Paternal Aunt     x 2  . Cancer Paternal  Aunt     type unknown    Social History    Social History   Social History  . Marital Status: Married    Spouse Name: N/A  . Number of Children: 1  . Years of Education: N/A   Occupational History  . retired    Social History Main Topics  . Smoking status: Former Smoker    Types: Cigarettes    Quit date: 02/27/1995  . Smokeless tobacco: Never Used  . Alcohol Use: 0.0 oz/week    0 Standard drinks or equivalent per week     Comment: couple times a month  . Drug Use: No  . Sexual Activity: Not on file   Other Topics Concern  . Not on file   Social History Narrative     Review of Systems    General:  No chills, fever, night sweats or weight changes.  Cardiovascular:  +++ pounding chest pain assoc w/ nausea and diaphoresis.  No dyspnea on exertion, edema, orthopnea, ? palpitations, no paroxysmal nocturnal dyspnea. Dermatological: No rash, lesions/masses Respiratory: No cough, dyspnea Urologic: No hematuria, dysuria Abdominal:   +++ nausea, no vomiting, diarrhea, bright red blood per rectum, melena, or hematemesis Neurologic:  No visual changes, wkns, changes in mental status. All other systems reviewed and are otherwise negative except as noted above.  Physical Exam    Blood pressure 129/73, pulse 64, temperature 98 F (36.7 C), temperature source Oral, resp.  rate 17, SpO2 94 %.  General: Pleasant, NAD Psych: Normal affect. Neuro: Alert and oriented X 3. Moves all extremities spontaneously. HEENT: Normal  Neck: Supple without bruits or JVD. Lungs:  Resp regular and unlabored, CTA. Heart: RRR no s3, s4, or murmurs. Abdomen: Soft, non-tender, non-distended, BS + x 4.  Extremities: No clubbing, cyanosis or edema. DP/PT/Radials 2+ and equal bilaterally.  Labs    Troponin [facility] Test)  Recent Labs  07/15/15 1215  TROPIPOC 0.00   Lab Results  Component Value Date   WBC 8.5 07/15/2015   HGB 12.9* 07/15/2015   HCT 38.7* 07/15/2015   MCV 87.4 07/15/2015   PLT 194 07/15/2015     Recent Labs Lab 07/15/15 1208  NA 139  K 4.0  CL 104  CO2 28  BUN 18  CREATININE 1.51*  CALCIUM 9.4  GLUCOSE 107*   Lab Results  Component Value Date   CHOL 146 08/25/2014   HDL 35.60* 08/25/2014   LDLCALC 79 08/25/2014   TRIG 157.0* 08/25/2014     Radiology Studies    Dg Chest 2 View  07/15/2015  CLINICAL DATA:  Centralized chest pain that started earlier today while patient was going to see his doctor, patients insertion site of his pacemaker has been bleeding X 1 week and patient was seen by his doctor X 1 week ago, EXAM: CHEST  2 VIEW COMPARISON:  06/28/2015 FINDINGS: Exam is lordotic. LEFT-sided pacemaker with 2 continuous leads overlies normal cardiac silhouette. No effusion, infiltrate or pneumothorax. IMPRESSION: No acute cardiopulmonary process. Electronically Signed   By: Suzy Bouchard M.D.   On: 07/15/2015 11:48   Dg Chest 2 View  06/28/2015  CLINICAL DATA:  Cardiac pacer. EXAM: CHEST  2 VIEW COMPARISON:  06/26/2015. FINDINGS: Cardiac pacer with lead tips in right atrium and right ventricle. Heart size normal. Bibasilar subsegmental atelectasis. No pleural effusion or pneumothorax. IMPRESSION: 1. Cardiac pacer in good anatomic position. No complicating features. No pneumothorax. 2. Low lung volumes with bibasilar subsegmental  atelectasis . Electronically Signed  By: [place]   On: 06/28/2015 07:34   Dg Chest 2 View  06/26/2015  CLINICAL DATA:  Chest pain last night. EXAM: CHEST  2 VIEW COMPARISON:  05/15/2014 FINDINGS: Normal heart size. Lungs clear. No pneumothorax. No pleural effusion. IMPRESSION: No active cardiopulmonary disease. Electronically Signed   By: Marybelle Killings M.D.   On: 06/26/2015 11:36    ECG & Cardiac Imaging    RSR, 62, LAE, no acute st/t changes.  Assessment & Plan    1.  Midsternal Chest Pain/Non-obstructive CAD: Pt presented following acute onset of pounding chest pain associated with diaphoresis and nausea - similar to previous symptoms when he was in rapid afib in 04/2014.  Ss lasted ~ 30-45 mins and resolved after receiving zofran.  He has had no recurrent symptoms.  ED ecg non-acute and trop nl.  In sinus here.  PPM interrogated today and did not show any arrhythmias today, though he did have a 4:15 min episode of AF on 5/12 (@ midnight) and an 11 minute episode on 5/16 in the AM, which he says he felt.  Repeat troponin - if nl, he may be d/c'd from the ED.  2.  Tachy-brady syndrome/PAF/Pacer site hematoma:  Previously loaded with tikosyn but this was d/c'd 2/2 QTc prolongation and development of heart block requiring SJM PPM placement on 5/1.  He was in [place] on 5/5 but is in sinus today.  Currently on amio 200 daily.  As noted above, he has had some PAF (5/12 and 5/16) but did not have any today.  His xarelto has been on hold since 5/12 in the setting of pocket hematoma and slight ooze from incision site.  Cont amio.  As site continues to ooze/bleed, we will continue to hold xareto and place a pressure dsg over the device site.  He will be seen back in device clinic early next week.  3.  Hypertensive Heart Disease:  Stable.  4. CKD III:  Creat relatively stable.  Signed, Erik Hodgkins, Erik [name] 07/15/2015, 4:09 PM   I have seen and examined the patient along with Erik Hodgkins, Erik [name].  I have reviewed the chart, notes and new data.  I agree with Erik [name]'s note. I interrogated his pacemaker in person.  Key new complaints: strong palpitations and chest pain today, now asymptomatic (were not associated with arrhythmia on device) Key examination changes: slow oozing of dark blood (old hematoma blood) from the medial corner of the pacemaker site. The area is not red on tender, slight ecchymotic discoloration. Other wise normal CV exam. Key new findings / data: Normal pacemaker and lead parameters. Generator BOL. 48% Ap, 13% Vp. All lead parameters checked today are excellent and identical to May 9 check. There have been 2 episodes of AFib since his last device check: 4h 15 min on May 12 (early AM) - the day he was told to stop Xarelto and 11 minutes around noon on May 16. None today.  PLAN: Place another pressure dressing and bring back to device clinic on Monday. Stay off Xarelto until then. Discussed with Dr. Curt Bears.  Erik Klein, Erik [name], [place] [phone] 07/15/2015, 5:09 PM

## 2015-07-15 NOTE — Progress Notes (Signed)
Patient presents to the office for a wound recheck s/p ppm implant on 06/27/15.   Upon bringing patient back from the lobby patient appeared to be off balance, pale, and diaphoretic. When asked what was wrong patient stated that he had been having CP x 15 minutes at an 8/10. Patient was brought back to an exam room. Spoke to Dr.Taylor about patient sx's and Dr.Taylor recommended that patient go to ER.   Spoke to wife regarding Dr.Taylor's recommendations and told her that patient could go via ambulance or they could sign him out and go by car. Wife stated that she would prefer him go by EMS. EMS was called and asked to come emergently. Prior to EMS arrival an EKG was obtained, which looked rather similar to prior readings.  Patient/wife asked for me to look at patient's ppm incision site since "that is what he came in for". Immediately after lifting bandage patient's site began to bleed once more. Bandage was reapplied to site  Once EMS arrived patient's information and EKG copy was given to them. They hooked patient up to their cables, and obtained IV access.   Patient was then transported to hospital.  Patient's CP was 5/10 when he left the office.

## 2015-07-18 ENCOUNTER — Encounter: Payer: Self-pay | Admitting: Internal Medicine

## 2015-07-18 ENCOUNTER — Ambulatory Visit (INDEPENDENT_AMBULATORY_CARE_PROVIDER_SITE_OTHER): Payer: Medicare Other | Admitting: *Deleted

## 2015-07-18 DIAGNOSIS — I480 Paroxysmal atrial fibrillation: Secondary | ICD-10-CM

## 2015-07-18 NOTE — Progress Notes (Signed)
Wound recheck in clinic s/p ppm implant on 5/1. Wound well healed, incision edges approximated. Dr.Camnitz assessed wound and recommended that patient restart Xarelto today and f/u as scheduled. Patient/wife voiced understanding.  Memory Dance, CMA, CCDS

## 2015-07-21 ENCOUNTER — Encounter: Payer: Self-pay | Admitting: Cardiology

## 2015-07-24 ENCOUNTER — Other Ambulatory Visit: Payer: Self-pay | Admitting: Cardiovascular Disease

## 2015-07-25 NOTE — Progress Notes (Signed)
Electrophysiology Office Note   Date:  07/26/2015   ID:  Erik [name], DOB Jul 09, 1945, MRN [id number]  PCP:  [name], MD  Cardiologist:  [name] Primary Electrophysiologist:  Erik Helmstetter [name], MD    Chief Complaint  Patient presents with  . Follow-up    post hospital   . PAF  . Shortness of Breath     History of Present Illness: Erik [name] is a 70 y.o. male who presents today for electrophysiology evaluation.   He presents today for workup of his atrial fibrillation.  He has a history of nonobstructive coronary artery disease, hypertension, hyperlipidemia, hypothyroidism. In March 2016, he was diagnosed with atrial fibrillation with RVR in the setting of chest pain. He was loaded on tikosyn which was stopped due to QTc prolongation.  Had pacemaker placed for tachy-brady syndrome.  Has had dark blood draining from pacemaker site and presented to the hospital without changes in lead parameters but continued drainage.   Today, he denies symptoms of palpitations, chest pain, shortness of breath, orthopnea, PND, lower extremity edema, claudication, dizziness, presyncope, syncope, bleeding, or neurologic sequela. The patient is tolerating medications without difficulties and is otherwise without complaint today.  He says that he was working in the yard yesterday, and after working, felt an incredible amount of fatigue.  He checked his pulse and it was regular , back in atrial fibrillation. He says that he doesn't have much energy, and is having a minor amount of shortness of breath.   Past Medical History  Diagnosis Date  . Hyperlipidemia   . Hypertensive heart disease   . Gout   . Chronic lower back pain   . Paroxysmal atrial fibrillation ([place])     a. Dx 04/2014; b. 05/2014 Echo: Ef 60-65%, no rwma, triv MR/TR, nl RV;  c. CHA2DS2VASc = 3-->was on eliquis, switched to xarelto 04/2015 2/2 cost; d. 05/2015 Tikosyn loaded w/ conversion to AF; e. 06/2015 QTc prolongation and  bradycardia->tikosyn d/c'd, PPM placed, Amio started; f. 07/01/2015 In Aflutter @ clinic f/u.  [name] Diverticulosis   . Internal hemorrhoids   . BPH with urinary obstruction   . Nephrolithiasis   . Hypothyroidism   . Non-obstructive CAD     a. 07/2002 Cath: LM 20, LAD 61m/d, LCX 50-28m, OM1 8m, RCA 27m, EF 60%; b. 05/2014 MV: low risk w/ small sized, mild intensity rev defect in apical/inferior/infsept area, nl EF->Med Rx.  . Carotid disease, bilateral ([place])     a. 09/2014 Carotid U/S: 1-39% bilat ICA stenosis.  . Paroxysmal atrial flutter ([place])     a. 06/2015 noted to be in rapid Aflutter in Afib clinic-->amio load continued.  . Tachy-brady syndrome ([place])     a. 06/27/2015 s/p SJM DC PPM (ser # @ [id number]).  . CKD (chronic kidney disease), stage III    Past Surgical History  Procedure Laterality Date  . Lumbar disc surgery  1998    2 lumbar discs, Dr. Ellene Route   . Hand surgery Right   . Colonoscopy  04-24-05    per Dr. Henrene Pastor, clear, repeat 10 yrs  . Ep implantable device N/A 06/27/2015    Procedure: Pacemaker Implant;  Surgeon: Evans Lance, MD;  Location: [facility];  Service: Cardiovascular;  Laterality: N/A;     Current Outpatient Prescriptions  Medication Sig Dispense Refill  . acetaminophen (TYLENOL) 500 MG tablet Take 1,000 mg by mouth every 6 (six) hours as needed. Reported on 07/06/2015    . amiodarone (  PACERONE) 200 MG tablet Take 200 mg by mouth daily.    [name] ibuprofen (ADVIL,MOTRIN) 200 MG tablet Take 200 mg by mouth every 8 (eight) hours as needed.    [name] levothyroxine (SYNTHROID, LEVOTHROID) 75 MCG tablet Take 1 tablet (75 mcg total) by mouth daily. 30 tablet 11  . losartan-hydrochlorothiazide (HYZAAR) 100-25 MG tablet Take 1 tablet by mouth daily. 30 tablet 6  . metoprolol tartrate (LOPRESSOR) 25 MG tablet Take 1 tablet (25 mg total) by mouth 2 (two) times daily. 60 tablet 4  . Multiple Vitamins-Minerals (MULTIVITAMIN WITH MINERALS) tablet Take 1 tablet by mouth daily.    .  rivaroxaban (XARELTO) 20 MG TABS tablet Take 1 tablet (20 mg total) by mouth daily with supper. 90 tablet 3  . simvastatin (ZOCOR) 20 MG tablet Take 1 tablet (20 mg total) by mouth daily. 90 tablet 3   No current facility-administered medications for this visit.    Allergies:   Review of patient's allergies indicates no known allergies.   Social History:  The patient  reports that he quit smoking about 20 years ago. His smoking use included Cigarettes. He has never used smokeless tobacco. He reports that he drinks alcohol. He reports that he does not use illicit drugs.   Family History:  The patient's family history includes Cancer in his paternal aunt; Dementia in his mother; Diabetes in his paternal grandmother; Stroke in his father and paternal aunt.    ROS:  Please see the history of present illness.   Otherwise, review of systems is positive for fatigue.   All other systems are reviewed and negative.    PHYSICAL EXAM: VS:  BP 102/64 mmHg  Pulse 112  Ht 5\' 11"  (1.803 m)  Wt 241 lb 6.4 oz (109.498 kg)  BMI 33.68 kg/m2 , BMI Body mass index is 33.68 kg/(m^2). GEN: Well nourished, well developed, in no acute distress HEENT: normal Neck: no JVD, carotid bruits, or masses Cardiac: irregular; no murmurs, rubs, or gallops,no edema  Respiratory:  clear to auscultation bilaterally, normal work of breathing GI: soft, nontender, nondistended, + BS MS: no deformity or atrophy Skin: warm and dry,  Device site well healed with small hematoma under the incision Neuro:  Strength and sensation are intact Psych: euthymic mood, full affect  EKG:  EKG is ordered today. The ekg ordered today shows atrial fibrillation, rate 112  Recent Labs: 08/25/2014: ALT 24 05/18/2015: TSH 4.21 06/28/2015: Magnesium 1.8 07/15/2015: BUN 18; Creatinine, Ser 1.51*; Hemoglobin 12.9*; Platelets 194; Potassium 4.0; Sodium 139    Lipid Panel     Component Value Date/Time   CHOL 146 08/25/2014 1135   TRIG 157.0*  08/25/2014 1135   TRIG 203* 12/10/2005 0839   HDL 35.60* 08/25/2014 1135   CHOLHDL 4 08/25/2014 1135   CHOLHDL 5.6 CALC 12/10/2005 0839   VLDL 31.4 08/25/2014 1135   LDLCALC 79 08/25/2014 1135   LDLDIRECT 104.9 06/09/2009 0919   LDLDIRECT 104.9 12/10/2005 0839     Wt Readings from Last 3 Encounters:  07/26/15 241 lb 6.4 oz (109.498 kg)  07/01/15 244 lb 12.8 oz (111.041 kg)  06/28/15 243 lb 1.6 oz (110.269 kg)      Other studies Reviewed: Additional studies/ records that were reviewed today include: TTE 06/03/14, Myoview 06/03/14 Review of the above records today demonstrates:  - Normal biventricular size and function. Moderate concentric LVH. Trivial mitral and tricupsid regurgitation, otherwise normal study.  Low risk stress nuclear study with a small sized, mild intensity reversible defect  concering for mild apical-inferior/inferoseptal ischeima. Clinical Correlation is recommended..   ASSESSMENT AND PLAN:  1.  Paroxysmal atrial fibrillation: Currently on Xarelto for a CHADS2VASc of 2. Was previously loaded on tikosyn, not tolerated due to QTc prolongation.  Discussed the option of continued medical management versus ablation. He does not wish to have an ablation at this time. We Vaani Morren therefore increase his amiodarone to 200 mg twice a day with an EKG in 1 week. If his EKG shows that he is in atrial fibrillation, Devonta Blanford arrange for him to have a cardioversion. We'll see him back in 3 months, and at that time further discuss ablation for his atrial fibrillation.  2. Hypertension: Well-controlled  This patients CHA2DS2-VASc Score and unadjusted Ischemic Stroke Rate (% per year) is equal to 2.2 % stroke rate/year from a score of 2  Above score calculated as 1 point each if present [CHF, HTN, DM, Vascular=MI/PAD/Aortic Plaque, Age if 65-74, or Male] Above score calculated as 2 points each if present [Age > 75, or Stroke/TIA/TE]   3.  Sinus node dysfunction:  Pacemaker  implanted and working without issues. Has had bloody drainage from his pacemaker site, but device site has healed without any issues. Does continue to have small hematoma , but no further drainage. We'll continue to monitor.  Current medicines are reviewed at length with the patient today.   The patient does not have concerns regarding his medicines.  The following changes were made today:  Metoprolol 50 mg BID  Labs/ tests ordered today include:  No orders of the defined types were placed in this encounter.     Disposition:   FU with Micah Barnier 3 months  Signed, Zackerie Sara [name], MD  07/26/2015 11:43 AM     [facility] [address] [place] [address] [phone] (office) [phone] (faHe was referred by by his primaryx)

## 2015-07-26 ENCOUNTER — Ambulatory Visit (INDEPENDENT_AMBULATORY_CARE_PROVIDER_SITE_OTHER): Payer: Medicare Other | Admitting: Cardiology

## 2015-07-26 ENCOUNTER — Encounter: Payer: Self-pay | Admitting: Cardiology

## 2015-07-26 VITALS — BP 102/64 | HR 112 | Ht 71.0 in | Wt 241.4 lb

## 2015-07-26 DIAGNOSIS — I4891 Unspecified atrial fibrillation: Secondary | ICD-10-CM | POA: Diagnosis not present

## 2015-07-26 DIAGNOSIS — I2510 Atherosclerotic heart disease of native coronary artery without angina pectoris: Secondary | ICD-10-CM

## 2015-07-26 MED ORDER — AMIODARONE HCL 200 MG PO TABS: 200.0000 mg | ORAL_TABLET | Freq: Two times a day (BID) | ORAL | Status: DC

## 2015-07-26 NOTE — Telephone Encounter (Signed)
Rx(s) sent to pharmacy electronically.  

## 2015-07-26 NOTE — Patient Instructions (Addendum)
Medication Instructions:  Your physician has recommended you make the following change in your medication:  1) INCREASE Amiodarone to 200 mg twice a day  Labwork: None ordered  Testing/Procedures: None ordered  Follow-Up: Your physician recommends that you schedule a follow-up appointment in: 1 week for nurse visit EKG  (if you are still in AFib at this appointment, cardioversion will be scheduled)  Your physician recommends that you schedule a follow-up appointment in: 3 months with Dr. Curt Bears.  If you need a refill on your cardiac medications before your next appointment, please call your pharmacy.  Thank you for choosing [facility]!!   Trinidad Curet, RN [phone]

## 2015-08-03 ENCOUNTER — Ambulatory Visit (INDEPENDENT_AMBULATORY_CARE_PROVIDER_SITE_OTHER): Payer: Medicare Other

## 2015-08-03 DIAGNOSIS — I4891 Unspecified atrial fibrillation: Secondary | ICD-10-CM

## 2015-08-03 NOTE — Patient Instructions (Signed)
Medication Instructions:  Your physician recommends that you continue on your current medications as directed. Please refer to the Current Medication list given to you today.  Labwork: No new orders.   Testing/Procedures: No new orders.   Follow-Up: Your physician recommends that you keep your scheduled follow-up appointment with Dr Curt Bears.   Any Other Special Instructions Will Be Listed Below (If Applicable).   If you need a refill on your cardiac medications before your next appointment, please call your pharmacy.

## 2015-08-03 NOTE — Progress Notes (Signed)
Pt came into the office with no complaints.  EKG obtained and shows NSR with atrial pacing rate 60.  Dr Curt Bears reviewed and no changes recommended at this time.  Follow-up in 3 months and continue current medications.

## 2015-08-09 ENCOUNTER — Encounter (HOSPITAL_BASED_OUTPATIENT_CLINIC_OR_DEPARTMENT_OTHER): Payer: Medicare Other

## 2015-08-21 ENCOUNTER — Other Ambulatory Visit: Payer: Self-pay | Admitting: Family Medicine

## 2015-08-23 ENCOUNTER — Ambulatory Visit (HOSPITAL_BASED_OUTPATIENT_CLINIC_OR_DEPARTMENT_OTHER): Payer: Medicare Other | Attending: Cardiology | Admitting: Cardiovascular Disease

## 2015-08-23 VITALS — Ht 71.5 in | Wt 240.0 lb

## 2015-08-23 DIAGNOSIS — Z7901 Long term (current) use of anticoagulants: Secondary | ICD-10-CM | POA: Diagnosis not present

## 2015-08-23 DIAGNOSIS — R0683 Snoring: Secondary | ICD-10-CM | POA: Diagnosis not present

## 2015-08-23 DIAGNOSIS — R400 Somnolence: Secondary | ICD-10-CM

## 2015-08-23 DIAGNOSIS — I4891 Unspecified atrial fibrillation: Secondary | ICD-10-CM | POA: Insufficient documentation

## 2015-08-23 DIAGNOSIS — G4710 Hypersomnia, unspecified: Secondary | ICD-10-CM | POA: Diagnosis not present

## 2015-08-23 DIAGNOSIS — Z79899 Other long term (current) drug therapy: Secondary | ICD-10-CM | POA: Diagnosis not present

## 2015-08-23 DIAGNOSIS — G4719 Other hypersomnia: Secondary | ICD-10-CM | POA: Insufficient documentation

## 2015-08-23 DIAGNOSIS — G4733 Obstructive sleep apnea (adult) (pediatric): Secondary | ICD-10-CM | POA: Diagnosis not present

## 2015-08-28 ENCOUNTER — Encounter (HOSPITAL_BASED_OUTPATIENT_CLINIC_OR_DEPARTMENT_OTHER): Payer: Self-pay | Admitting: Cardiovascular Disease

## 2015-08-28 NOTE — Procedures (Signed)
Patient Name: Erik [name], Erik [name] Date: 08/23/2015 Gender: Male D.O.B: 1945-09-29 Age (years): 82 Referring Provider: Will Camnitz Height (inches): 72 Interpreting Physician: Shelva Majestic MD, ABSM Weight (lbs): 240 RPSGT: Madelon Lips BMI: 33 MRN: [id number] Neck Size: 17.50  CLINICAL INFORMATION Sleep Study Type: NPSG Indication for sleep study: Excessive Daytime Sleepiness, Snoring, atrial fibrillation Epworth Sleepiness Score: 6  SLEEP STUDY TECHNIQUE As per the AASM Manual for the Scoring of Sleep and Associated Events v2.3 (April 2016) with a hypopnea requiring 4% desaturations. The channels recorded and monitored were frontal, central and occipital EEG, electrooculogram (EOG), submentalis EMG (chin), nasal and oral airflow, thoracic and abdominal wall motion, anterior tibialis EMG, snore microphone, electrocardiogram, and pulse oximetry.  MEDICATIONS simvastatin (ZOCOR) 20 MG tablet 20 mg, Daily     rivaroxaban (XARELTO) 20 MG TABS tablet 20 mg, Daily with supper     Multiple Vitamins-Minerals (MULTIVITAMIN WITH MINERALS) tablet 1 tablet, Daily     metoprolol tartrate (LOPRESSOR) 25 MG tablet 25 mg, 2 times daily     losartan-hydrochlorothiazide (HYZAAR) 100-25 MG tablet 1 tablet, Daily     levothyroxine (SYNTHROID, LEVOTHROID) 75 MCG tablet      ibuprofen (ADVIL,MOTRIN) 200 MG tablet 200 mg, Every 8 hours PRN     amiodarone (PACERONE) 200 MG tablet 200 mg, 2 times daily     acetaminophen (TYLENOL) 500 MG tablet   Medications self-administered by patient during sleep study : No sleep medicine administered.  SLEEP ARCHITECTURE The study was initiated at 10:19:52 PM and ended at 4:24:49 AM. Sleep onset time was 50.1 minutes and the sleep efficiency was 41.9%. The total sleep time was 153.0 minutes. Wake after sleep onset (WASO) was 161.8 minutes. Stage REM latency was 59.0 minutes. The patient spent 44.12% of the night in stage N1 sleep, 50.00% in stage N2 sleep,  0.00% in stage N3 and 5.88% in REM. Alpha intrusion was absent. Supine sleep was 80.39%.  RESPIRATORY PARAMETERS The overall apnea/hypopnea index (AHI) was 69.0 per hour. There were 45 total apneas, including 33 obstructive, 12 central and 0 mixed apneas. There were 131 hypopneas and 24 RERAs. The AHI during Stage REM sleep was 53.3 per hour. AHI while supine was 80.0 per hour. The mean oxygen saturation was 94.04%. The minimum SpO2 during sleep was 79.00%. Moderate snoring was noted during this study.  CARDIAC DATA The 2 lead EKG demonstrated A paced sinus rhythm. The mean heart rate was 60.95 beats per minute. Other EKG findings include: None.  LEG MOVEMENT DATA The total PLMS were 0 with a resulting PLMS index of 0.00. Associated arousal with leg movement index was 0.0 .  IMPRESSIONS - Severe obstructive sleep apnea occurred during this study (AHI = 69.0/h).  Events were worse with supine sleep with an AHI of  80.39/h. - No significant central sleep apnea occurred during this study (CAI = 4.7/h). - Significant oxygen desaturation to a nadir of 79.00%. - Reduced sleep efficiency. - Moderate snoring. - No cardiac abnormalities were noted during this study. The patient had an atrial paced sinus rhythm. - Clinically significant periodic limb movements did not occur during sleep. No significant associated arousals.  DIAGNOSIS - Obstructive Sleep Apnea (327.23 [G47.33 ICD-10])  RECOMMENDATIONS - Recommend expeditious CPAP titration in this patient with very severe sleep apnea and cardiovascular comorbidities to determine optimal pressure required to alleviate sleep disordered breathing. - Efforts should be made to optimize nasal and oropharyngeal patency. - The patient should be counseled to try to avoid supine  sleep. - Avoid alcohol, sedatives and other CNS depressants that may worsen sleep apnea and disrupt normal sleep architecture. - Sleep hygiene should be reviewed to assess  factors that may improve sleep quality. - Weight management (BMI 33) and regular exercise should be initiated.   Troy Sine, MD, [place], American Board of Sleep Medicine  ELECTRONICALLY SIGNED ON:  08/28/2015, 12:13 PM [place] PH: [phone]   FX: [phone] [place]

## 2015-08-29 ENCOUNTER — Telehealth: Payer: Self-pay | Admitting: *Deleted

## 2015-08-29 ENCOUNTER — Other Ambulatory Visit: Payer: Self-pay | Admitting: *Deleted

## 2015-08-29 DIAGNOSIS — G4733 Obstructive sleep apnea (adult) (pediatric): Secondary | ICD-10-CM

## 2015-08-29 NOTE — Telephone Encounter (Signed)
Left message on home answering machine. Sleep study positive for sleep apnea. CPAP titration study has been ordered ASAP per Dr Claiborne Billings.

## 2015-09-16 ENCOUNTER — Encounter (HOSPITAL_COMMUNITY): Payer: Self-pay | Admitting: Nurse Practitioner

## 2015-09-16 ENCOUNTER — Encounter: Payer: Self-pay | Admitting: Cardiology

## 2015-09-16 ENCOUNTER — Telehealth: Payer: Self-pay | Admitting: Cardiology

## 2015-09-16 ENCOUNTER — Ambulatory Visit (HOSPITAL_COMMUNITY)
Admission: RE | Admit: 2015-09-16 | Discharge: 2015-09-16 | Disposition: A | Discharge status: Home / Self Care | Payer: Medicare Other | Source: Ambulatory Visit | Attending: Nurse Practitioner | Admitting: Nurse Practitioner

## 2015-09-16 VITALS — BP 114/64 | HR 125 | Ht 71.5 in | Wt 246.8 lb

## 2015-09-16 DIAGNOSIS — I2510 Atherosclerotic heart disease of native coronary artery without angina pectoris: Secondary | ICD-10-CM | POA: Diagnosis not present

## 2015-09-16 DIAGNOSIS — G4733 Obstructive sleep apnea (adult) (pediatric): Secondary | ICD-10-CM | POA: Diagnosis not present

## 2015-09-16 DIAGNOSIS — Z7901 Long term (current) use of anticoagulants: Secondary | ICD-10-CM | POA: Diagnosis not present

## 2015-09-16 DIAGNOSIS — I480 Paroxysmal atrial fibrillation: Secondary | ICD-10-CM

## 2015-09-16 DIAGNOSIS — I1310 Hypertensive heart and chronic kidney disease without heart failure, with stage 1 through stage 4 chronic kidney disease, or unspecified chronic kidney disease: Secondary | ICD-10-CM | POA: Insufficient documentation

## 2015-09-16 DIAGNOSIS — Z79899 Other long term (current) drug therapy: Secondary | ICD-10-CM | POA: Insufficient documentation

## 2015-09-16 DIAGNOSIS — N183 Chronic kidney disease, stage 3 (moderate): Secondary | ICD-10-CM | POA: Diagnosis not present

## 2015-09-16 DIAGNOSIS — E785 Hyperlipidemia, unspecified: Secondary | ICD-10-CM | POA: Diagnosis not present

## 2015-09-16 DIAGNOSIS — Z87891 Personal history of nicotine dependence: Secondary | ICD-10-CM | POA: Insufficient documentation

## 2015-09-16 DIAGNOSIS — E039 Hypothyroidism, unspecified: Secondary | ICD-10-CM | POA: Diagnosis not present

## 2015-09-16 LAB — COMPREHENSIVE METABOLIC PANEL
ALT: 50 U/L (ref 17–63)
AST: 34 U/L (ref 15–41)
Albumin: 4.2 g/dL (ref 3.5–5.0)
Alkaline Phosphatase: 39 U/L (ref 38–126)
Anion gap: 8 (ref 5–15)
BUN: 30 mg/dL — AB (ref 6–20)
CHLORIDE: 107 mmol/L (ref 101–111)
CO2: 25 mmol/L (ref 22–32)
Calcium: 9.9 mg/dL (ref 8.9–10.3)
Creatinine, Ser: 1.91 mg/dL — ABNORMAL HIGH (ref 0.61–1.24)
GFR calc Af Amer: 40 mL/min — ABNORMAL LOW (ref >60)
GFR, EST NON AFRICAN AMERICAN: 34 mL/min — AB (ref >60)
Glucose, Bld: 104 mg/dL — ABNORMAL HIGH (ref 65–99)
POTASSIUM: 3.8 mmol/L (ref 3.5–5.1)
SODIUM: 140 mmol/L (ref 135–145)
Total Bilirubin: 0.9 mg/dL (ref 0.3–1.2)
Total Protein: 6.8 g/dL (ref 6.5–8.1)

## 2015-09-16 LAB — CBC
HCT: 46.1 % (ref 39.0–52.0)
Hemoglobin: 15.4 g/dL (ref 13.0–17.0)
MCH: 30 pg (ref 26.0–34.0)
MCHC: 33.4 g/dL (ref 30.0–36.0)
MCV: 89.7 fL (ref 78.0–100.0)
PLATELETS: 247 10*3/uL (ref 150–400)
RBC: 5.14 MIL/uL (ref 4.22–5.81)
RDW: 13.1 % (ref 11.5–15.5)
WBC: 7.3 10*3/uL (ref 4.0–10.5)

## 2015-09-16 LAB — TSH: TSH: 2.491 u[IU]/mL (ref 0.350–4.500)

## 2015-09-16 LAB — T4, FREE: Free T4: 1.29 ng/dL — ABNORMAL HIGH (ref 0.61–1.12)

## 2015-09-16 MED ORDER — DILTIAZEM HCL 30 MG PO TABS: ORAL_TABLET | ORAL | Status: DC

## 2015-09-16 MED ORDER — AMIODARONE HCL 200 MG PO TABS: 200.0000 mg | ORAL_TABLET | Freq: Every day | ORAL | Status: DC

## 2015-09-16 MED ORDER — LOSARTAN POTASSIUM-HCTZ 100-25 MG PO TABS: 0.5000 | ORAL_TABLET | Freq: Every day | ORAL | Status: DC

## 2015-09-16 NOTE — Telephone Encounter (Signed)
Mr. Whack calling due to shortness of breath. He has a history of paroxysmal AF, anticoagulated with Xarelto. On Amiodarone 200mg  BID, lopressor 25mg  BID. He verifies that he is taking his meds as prescribed. He says that he has been short of breath for the past 3 days. I asked him to take his BP- it was 120/97 and HR was 69 while on the phone with me. He says that he is SOB with minimal activity. I do not have a heart failure diagnostic on his PPM, but he has a 17% AF burden since 07/06/15 and he has had an episode of RVR yesterday morning around 9, lasting over 2 hours at an average rate of 162bpm. I will review with DOD and call Mr. Fawcett back. He is Patent attorney.

## 2015-09-16 NOTE — Patient Instructions (Addendum)
Your physician has recommended you make the following change in your medication: START  Cardizem 30mg  -- take 1 tablet every 4 hours AS NEEDED for heart rate >100 as long as blood pressure >100.  DECREASE amiodarone to one tablet daily DECREASE Losartan-HCTZ to one half tablet daily  Follow up appt has been scheduled for 07/24  Will set up for cardioversion Tuesday if you are still in A-fib on Monday

## 2015-09-16 NOTE — Telephone Encounter (Signed)
Reviewed AF/ symptoms with Erik Curet, RN. AF Clinic opening at 10am today, he has been seen by Erik Palau, NP previously. [name] is agreeable to this appt.

## 2015-09-16 NOTE — Telephone Encounter (Deleted)
Error

## 2015-09-16 NOTE — Progress Notes (Signed)
Patient ID: Erik [name], male   DOB: [birth year], 70 y.o.   MRN: [id number]     Primary Care Physician: [name], MD Referring Physician: Dr. Thereasa Distance Erik [name] is a 70 y.o. male with a h/o PAF,admitted and Tikosyn was initiated. Renal function and electrolytes were followed during the hospitalization. Their QTc remained stable. He converted to SR spontaneously. They were monitored until discharge on telemetry which demonstrated sinus rhythm. On the day of discharge, they were examined by Dr Curt Bears who considered them stable for discharge to home.  He was readmitted 06/26/15 with dizziness, noted to have prolonged QT and his Tiklosyn held, and bradycardia with rates 40's-50's, he was admitted for further monitoring.admitted to telemetry bed, his Tikosyn held for prolonged QT, he went into AFib with RVR and was given bolus diltiazem with 3 prolongedr pauses of up to 7 seconds, his post termination pause 6.9 seconds. 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 Curt Bears was decide to to pursue PPM implant. He underwent implantation of a PPM with details as outlined above. He was monitored on telemetry overnight which demonstrated A pacing, V sensing. Left chest was without hematoma or ecchymosis. The device was interrogated and found to be functioning normally. CXR was obtained and demonstrated no pneumothorax status post device implantation. Wound care, arm mobility, and restrictions were reviewed with the patient. The patient was examined by Dr. Curt Bears and considered stable for discharge to home. It has been > 48 hours since his last Tikosyn dose with resolution of his prolonged QT, we will start today amiodarone 400mg  BID for one week, the 200mg  daily. He will keep his appt with AF clinic Friday.  In afib  clinic, pt does not feel quite as well today. He is in aflutter with rapid AV block at 142 bpm, however after he rested for awhile, his HR was around upper 90's. He has not taken am metoprolol or amiodarone yet this am. He usually waits to take his medicine around 10 am. The wife reports that he left the hospital in [place].Device site check reveals old bruising around the site, with some mild swelling around the device. Wife states that this is unchanged/ improved since d/c. Steri strips in place. Pt overall feels ok.  Am seeing pt back in afib clinic today after remote report showed afib with rvr. Pt has noticed some increasing fatigue and shortness of breath. Recent had sleep study that showed severe OSA, pending cpap titration this Sunday. Pt had some issues with afib end of May, and amio was increased to 200 mg bid and he has been on this dose since then. He had a repeat EKG at that time and was back in SR, cardioversion was discussed but with SR not needed.  Interrogation of device shows that pt has been in constant afib since yesterday. He had been in afib the majority of May and June but went several weeks July 3rd thru the 17th without afib reported. Denies any PND, Orthopnea, no dyspnea noted at rest.  Today, he denies symptoms of palpitations, chest pain,orthopnea, PND, lower extremity edema, dizziness, presyncope, syncope, or neurologic sequela. Positive for fatigue and shortness of breath.  The patient is tolerating medications without difficulties and is otherwise without complaint today.   Past Medical History  Diagnosis Date  . Hyperlipidemia   .  Hypertensive heart disease   . Gout   . Chronic lower back pain   . Paroxysmal atrial fibrillation ([place])     a. Dx 04/2014; b. 05/2014 Echo: Ef 60-65%, no rwma, triv MR/TR, nl RV;  c. CHA2DS2VASc = 3-->was on eliquis, switched to xarelto 04/2015 2/2 cost; d. 05/2015 Tikosyn loaded w/ conversion to AF; e. 06/2015 QTc prolongation and bradycardia->tikosyn  d/c'd, PPM placed, Amio started; f. 07/01/2015 In Aflutter @ clinic f/u.  [name] Diverticulosis   . Internal hemorrhoids   . BPH with urinary obstruction   . Nephrolithiasis   . Hypothyroidism   . Non-obstructive CAD     a. 07/2002 Cath: LM 20, LAD 7m/d, LCX 50-71m, OM1 57m, RCA 59m, EF 60%; b. 05/2014 MV: low risk w/ small sized, mild intensity rev defect in apical/inferior/infsept area, nl EF->Med Rx.  . Carotid disease, bilateral ([place])     a. 09/2014 Carotid U/S: 1-39% bilat ICA stenosis.  . Paroxysmal atrial flutter ([place])     a. 06/2015 noted to be in rapid Aflutter in Afib clinic-->amio load continued.  . Tachy-brady syndrome ([place])     a. 06/27/2015 s/p SJM DC PPM (ser # @ [id number]).  . CKD (chronic kidney disease), stage III    Past Surgical History  Procedure Laterality Date  . Lumbar disc surgery  1998    2 lumbar discs, Dr. Ellene Route   . Hand surgery Right   . Colonoscopy  04-24-05    per Dr. Henrene Pastor, clear, repeat 10 yrs  . Ep implantable device N/A 06/27/2015    Procedure: Pacemaker Implant;  Surgeon: Evans Lance, MD;  Location: [facility];  Service: Cardiovascular;  Laterality: N/A;    Current Outpatient Prescriptions  Medication Sig Dispense Refill  . acetaminophen (TYLENOL) 500 MG tablet Take 1,000 mg by mouth every 6 (six) hours as needed. Reported on 07/06/2015    . amiodarone (PACERONE) 200 MG tablet Take 1 tablet (200 mg total) by mouth daily. 180 tablet 1  . ibuprofen (ADVIL,MOTRIN) 200 MG tablet Take 200 mg by mouth every 8 (eight) hours as needed.    [name] levothyroxine (SYNTHROID, LEVOTHROID) 75 MCG tablet TAKE 1 TABLET (75 MCG TOTAL) BY MOUTH DAILY. 30 tablet 1  . losartan-hydrochlorothiazide (HYZAAR) 100-25 MG tablet Take 0.5 tablets by mouth daily. 30 tablet 6  . metoprolol tartrate (LOPRESSOR) 25 MG tablet Take 1 tablet (25 mg total) by mouth 2 (two) times daily. 60 tablet 4  . Multiple Vitamins-Minerals (MULTIVITAMIN WITH MINERALS) tablet Take 1 tablet by mouth daily.     . rivaroxaban (XARELTO) 20 MG TABS tablet Take 1 tablet (20 mg total) by mouth daily with supper. 90 tablet 3  . simvastatin (ZOCOR) 20 MG tablet Take 1 tablet (20 mg total) by mouth daily. 90 tablet 3  . diltiazem (CARDIZEM) 30 MG tablet Cardizem 30mg  -- take 1 tablet every 4 hours AS NEEDED for heart rate >100 as long as blood pressure >100. 45 tablet 1   No current facility-administered medications for this encounter.    No Known Allergies  Social History   Social History  . Marital Status: Married    Spouse Name: N/A  . Number of Children: 1  . Years of Education: N/A   Occupational History  . retired    Social History Main Topics  . Smoking status: Former Smoker    Types: Cigarettes    Quit date: 02/27/1995  . Smokeless tobacco: Never Used  . Alcohol Use: 0.0 oz/week  0 Standard drinks or equivalent per week     Comment: couple times a month  . Drug Use: No  . Sexual Activity: Not on file   Other Topics Concern  . Not on file   Social History Narrative    Family History  Problem Relation Age of Onset  . Dementia Mother   . Stroke Father   . Diabetes Paternal Grandmother   . Stroke Paternal Aunt     x 2  . Cancer Paternal Aunt     type unknown    ROS- All systems are reviewed and negative except as per the HPI above  Physical Exam: Filed Vitals:   09/16/15 1010  BP: 114/64  Pulse: 125  Height: 5' 11.5" (1.816 m)  Weight: 246 lb 12.8 oz (111.948 kg)  BP recheck 130/80, HR in upper 90's  GEN- The patient is well appearing, alert and oriented x 3 today.   Head- normocephalic, atraumatic Eyes-  Sclera clear, conjunctiva pink Ears- hearing intact Oropharynx- clear Neck- supple, no JVP Lymph- no cervical lymphadenopathy Lungs- Clear to ausculation bilaterally, normal work of breathing Heart-irregular rate and rhythm, no murmurs, rubs or gallops, PMI not laterally displaced. PPM site with old bruising, mild swelling, no redness, d/c Steri strips in  place GI- soft, NT, ND, + BS Extremities- no clubbing, cyanosis, or edema MS- no significant deformity or atrophy Skin- no rash or lesion Psych- euthymic mood, full affect Neuro- strength and sensation are intact  EKG- a fib at 125 bpm, with rvr, qrs int 82 ms, qtc 513 ms Interrogation done by [facility], [address] Jude rep,- found to be functioning normally with afib present since yesterday  Assessment and Plan: 1. Afib Symptomatic termination pauses up to 7 secs  with tikosyn with subsequent PPM and d/c of tikosyn, 4/16 On amiodarone 200 mg bid since May but still with increase afib burden, afib since yesterday with rvr States no missed doses of xarelto Will rx 30 mg cardizem to use every 4 hours for HR over 100 bpm, Sys BP over 100. Since BP is on soft side will decrease Lis/hct to 1/2 tab a day Decrease amiodarone to 200 mg a day Stressed importance of following thru with cpap titration trial pending this Sunday Cmet,thyroid panel,cbc today  F/u in afib clinic on Monday  If still in afib will proceed with cardioversion on Tuesday IF worse over weekend, proceed to the [place]. Amarri Satterly, [facility] [address] [place], [place] [address] [phone]

## 2015-09-17 LAB — T3, FREE: T3, Free: 2.6 pg/mL (ref 2.0–4.4)

## 2015-09-18 ENCOUNTER — Ambulatory Visit (HOSPITAL_BASED_OUTPATIENT_CLINIC_OR_DEPARTMENT_OTHER): Payer: Medicare Other | Attending: Internal Medicine | Admitting: Cardiovascular Disease

## 2015-09-18 VITALS — Ht 71.5 in | Wt 245.0 lb

## 2015-09-18 DIAGNOSIS — Z79899 Other long term (current) drug therapy: Secondary | ICD-10-CM | POA: Diagnosis not present

## 2015-09-18 DIAGNOSIS — R0683 Snoring: Secondary | ICD-10-CM | POA: Insufficient documentation

## 2015-09-18 DIAGNOSIS — Z7901 Long term (current) use of anticoagulants: Secondary | ICD-10-CM | POA: Diagnosis not present

## 2015-09-18 DIAGNOSIS — G4733 Obstructive sleep apnea (adult) (pediatric): Secondary | ICD-10-CM | POA: Diagnosis not present

## 2015-09-19 ENCOUNTER — Encounter (HOSPITAL_COMMUNITY): Payer: Self-pay | Admitting: Nurse Practitioner

## 2015-09-19 ENCOUNTER — Ambulatory Visit (HOSPITAL_COMMUNITY)
Admission: RE | Admit: 2015-09-19 | Discharge: 2015-09-19 | Disposition: A | Discharge status: Home / Self Care | Payer: Medicare Other | Source: Ambulatory Visit | Attending: Nurse Practitioner | Admitting: Nurse Practitioner

## 2015-09-19 VITALS — BP 160/96 | Ht 71.5 in | Wt 246.0 lb

## 2015-09-19 DIAGNOSIS — Z87891 Personal history of nicotine dependence: Secondary | ICD-10-CM | POA: Insufficient documentation

## 2015-09-19 DIAGNOSIS — Z7901 Long term (current) use of anticoagulants: Secondary | ICD-10-CM | POA: Insufficient documentation

## 2015-09-19 DIAGNOSIS — Z79899 Other long term (current) drug therapy: Secondary | ICD-10-CM | POA: Insufficient documentation

## 2015-09-19 DIAGNOSIS — I481 Persistent atrial fibrillation: Secondary | ICD-10-CM | POA: Diagnosis not present

## 2015-09-19 DIAGNOSIS — E039 Hypothyroidism, unspecified: Secondary | ICD-10-CM | POA: Diagnosis not present

## 2015-09-19 DIAGNOSIS — I480 Paroxysmal atrial fibrillation: Secondary | ICD-10-CM | POA: Diagnosis not present

## 2015-09-19 DIAGNOSIS — Z823 Family history of stroke: Secondary | ICD-10-CM | POA: Insufficient documentation

## 2015-09-19 DIAGNOSIS — I1310 Hypertensive heart and chronic kidney disease without heart failure, with stage 1 through stage 4 chronic kidney disease, or unspecified chronic kidney disease: Secondary | ICD-10-CM | POA: Diagnosis not present

## 2015-09-19 DIAGNOSIS — E785 Hyperlipidemia, unspecified: Secondary | ICD-10-CM | POA: Insufficient documentation

## 2015-09-19 DIAGNOSIS — N183 Chronic kidney disease, stage 3 (moderate): Secondary | ICD-10-CM | POA: Diagnosis not present

## 2015-09-19 DIAGNOSIS — I495 Sick sinus syndrome: Secondary | ICD-10-CM | POA: Insufficient documentation

## 2015-09-19 DIAGNOSIS — Z950 Presence of cardiac pacemaker: Secondary | ICD-10-CM | POA: Diagnosis not present

## 2015-09-19 DIAGNOSIS — I2510 Atherosclerotic heart disease of native coronary artery without angina pectoris: Secondary | ICD-10-CM | POA: Diagnosis not present

## 2015-09-19 DIAGNOSIS — I4819 Other persistent atrial fibrillation: Secondary | ICD-10-CM

## 2015-09-19 LAB — BASIC METABOLIC PANEL
ANION GAP: 7 (ref 5–15)
BUN: 22 mg/dL — ABNORMAL HIGH (ref 6–20)
CALCIUM: 9.2 mg/dL (ref 8.9–10.3)
CO2: 26 mmol/L (ref 22–32)
CREATININE: 1.49 mg/dL — AB (ref 0.61–1.24)
Chloride: 108 mmol/L (ref 101–111)
GFR, EST AFRICAN AMERICAN: 54 mL/min — AB (ref >60)
GFR, EST NON AFRICAN AMERICAN: 46 mL/min — AB (ref >60)
Glucose, Bld: 95 mg/dL (ref 65–99)
Potassium: 3.6 mmol/L (ref 3.5–5.1)
SODIUM: 141 mmol/L (ref 135–145)

## 2015-09-19 MED ORDER — SIMVASTATIN 20 MG PO TABS: 10.0000 mg | ORAL_TABLET | Freq: Every day | ORAL | 3 refills | Status: DC

## 2015-09-19 MED ORDER — DILTIAZEM HCL ER COATED BEADS 120 MG PO CP24: 120.0000 mg | ORAL_CAPSULE | Freq: Every day | ORAL | 6 refills | Status: DC

## 2015-09-19 NOTE — Patient Instructions (Signed)
Your physician has recommended you make the following change in your medication:  1)Cardizem 120mg  once a day 2)Decrease Zocor to 10mg  once a day 3)can still use Cardizem 30mg  as needed for fast heart rate

## 2015-09-19 NOTE — Progress Notes (Signed)
Patient ID: Erik [name], male   DOB: Jan 23, 1946, 70 y.o.   MRN: [id number]     Primary Care Physician: Erik Morale, MD Referring Physician: Dr. Thereasa [name] Erik [name] is a 70 y.o. male with a h/o PAF,admitted and Tikosyn was initiated. Renal function and electrolytes were followed during the hospitalization. Their QTc remained stable. He converted to SR spontaneously. They were monitored until discharge on telemetry which demonstrated sinus rhythm. On the day of discharge, they were examined by [name] who considered them stable for discharge to home.  He was readmitted 06/26/15 with dizziness, noted to have prolonged QT and his Tiklosyn held, and bradycardia with rates 40's-50's, he was admitted for further monitoring.admitted to telemetry bed, his Tikosyn held for prolonged QT, he went into AFib with RVR and was given bolus diltiazem with 3 prolongedr pauses of up to 7 seconds, his post termination pause 6.9 seconds. 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 Curt [name] was decide to to pursue PPM implant. He underwent implantation of a PPM with details as outlined above. He was monitored on telemetry overnight which demonstrated A pacing, V sensing. Left chest was without hematoma or ecchymosis. The device was interrogated and found to be functioning normally. CXR was obtained and demonstrated no pneumothorax status post device implantation. Wound care, arm mobility, and restrictions were reviewed with the patient. The patient was examined by Dr. Curt [name] and considered stable for discharge to home. It has been > 48 hours since his last Tikosyn dose with resolution of his prolonged QT, we will start today amiodarone 400mg  BID for one week, the 200mg  daily. He will keep his appt with AF clinic Friday.  In afib  clinic, pt does not feel quite as well today. He is in aflutter with rapid AV block at 142 bpm, however after he rested for awhile, his HR was around upper 90's. He has not taken am metoprolol or amiodarone yet this am. He usually waits to take his medicine around 10 am. The wife reports that he left the hospital in [place].Device site check reveals old bruising around the site, with some mild swelling around the device. Wife states that this is unchanged/ improved since d/c. Steri strips in place. Pt overall feels ok.  Am seeing pt back in afib clinic today after remote report showed afib with rvr. Pt has noticed some increasing fatigue and shortness of breath. Recent had sleep study that showed severe OSA, pending cpap titration this Sunday. Pt had some issues with afib end of May, and amio was increased to 200 mg bid and he has been on this dose since then. He had a repeat EKG at that time and was back in SR, cardioversion was discussed but with SR not needed.  Interrogation of device shows that pt has been in constant afib since yesterday. He had been in afib the majority of May and June but went several weeks July 3rd thru the 17th without afib reported. Denies any PND, Orthopnea, no dyspnea noted at rest.  Labs returned showing mild dehydration and lisn/hct cut in half, esp with soft BP. He was instructed to drink more water over the weekend. Amiodarone was cut in half and he was given 30 mg cardizem to use as needed. He was set up for cardioversion for 7/25. He returns today  and reports that he went back into SR with the first dose of cardizem Friday pm and used another on  Saturday am and since has been in [place] since then.[name] He is on amiodarone  200 mg a day, reduced from 200 mg bid.. He had his cpap titration trial last night. He feels much better.  Today, he denies symptoms of palpitations, chest pain,orthopnea, PND, lower extremity edema, dizziness, presyncope, syncope, or neurologic sequela. Positive for  fatigue and shortness of breath.  The patient is tolerating medications without difficulties and is otherwise without complaint today.   Past Medical History:  Diagnosis Date  . BPH with urinary obstruction   . Carotid disease, bilateral ([place])    a. 09/2014 Carotid U/S: 1-39% bilat ICA stenosis.  . Chronic lower back pain   . CKD (chronic kidney disease), stage III   . Diverticulosis   . Gout   . Hyperlipidemia   . Hypertensive heart disease   . Hypothyroidism   . Internal hemorrhoids   . Nephrolithiasis   . Non-obstructive CAD    a. 07/2002 Cath: LM 20, LAD 53m/d, LCX 50-24m, OM1 48m, RCA 12m, EF 60%; b. 05/2014 MV: low risk w/ small sized, mild intensity rev defect in apical/inferior/infsept area, nl EF->Med Rx.  . Paroxysmal atrial fibrillation ([place])    a. Dx 04/2014; b. 05/2014 Echo: Ef 60-65%, no rwma, triv MR/TR, nl RV;  c. CHA2DS2VASc = 3-->was on eliquis, switched to xarelto 04/2015 2/2 cost; d. 05/2015 Tikosyn loaded w/ conversion to AF; e. 06/2015 QTc prolongation and bradycardia->tikosyn d/c'd, PPM placed, Amio started; f. 07/01/2015 In Aflutter @ clinic f/u.  [name] Paroxysmal atrial flutter ([place])    a. 06/2015 noted to be in rapid Aflutter in Afib clinic-->amio load continued.  . Tachy-brady syndrome ([place])    a. 06/27/2015 s/p SJM DC PPM (ser # @ [id number]).   Past Surgical History:  Procedure Laterality Date  . COLONOSCOPY  04-24-05   per Dr. Henrene [name], clear, repeat 10 yrs  . EP IMPLANTABLE DEVICE N/A 06/27/2015   Procedure: Pacemaker Implant;  Surgeon: Erik Lance, MD;  Location: [facility];  Service: Cardiovascular;  Laterality: N/A;  . HAND SURGERY Right   . LUMBAR DISC SURGERY  1998   2 lumbar discs, Dr. Ellene [name]     Current Outpatient Prescriptions  Medication Sig Dispense Refill  . acetaminophen (TYLENOL) 500 MG tablet Take 1,000 mg by mouth every 6 (six) hours as needed. Reported on 07/06/2015    . amiodarone (PACERONE) 200 MG tablet Take 1 tablet (200 mg total) by mouth  daily. 180 tablet 1  . diltiazem (CARDIZEM) 30 MG tablet Cardizem 30mg  -- take 1 tablet every 4 hours AS NEEDED for heart rate >100 as long as blood pressure >100. 45 tablet 1  . ibuprofen (ADVIL,MOTRIN) 200 MG tablet Take 200 mg by mouth every 8 (eight) hours as needed.    [name] levothyroxine (SYNTHROID, LEVOTHROID) 75 MCG tablet TAKE 1 TABLET (75 MCG TOTAL) BY MOUTH DAILY. 30 tablet 1  . losartan-hydrochlorothiazide (HYZAAR) 100-25 MG tablet Take 0.5 tablets by mouth daily. 30 tablet 6  . metoprolol tartrate (LOPRESSOR) 25 MG tablet Take 1 tablet (25 mg total) by mouth 2 (two) times daily. 60 tablet 4  . Multiple Vitamins-Minerals (MULTIVITAMIN WITH MINERALS) tablet Take 1 tablet by mouth daily.    . rivaroxaban (XARELTO) 20 MG TABS tablet Take 1 tablet (20 mg total) by mouth daily with supper. 90 tablet 3  . simvastatin (ZOCOR) 20  MG tablet Take 0.5 tablets (10 mg total) by mouth daily. 90 tablet 3  . diltiazem (CARDIZEM CD) 120 MG 24 hr capsule Take 1 capsule (120 mg total) by mouth daily. 30 capsule 6   No current facility-administered medications for this encounter.     No Known Allergies  Social History   Social History  . Marital status: Married    Spouse name: N/A  . Number of children: 1  . Years of education: N/A   Occupational History  . retired    Social History Main Topics  . Smoking status: Former Smoker    Types: Cigarettes    Quit date: 02/27/1995  . Smokeless tobacco: Never Used  . Alcohol use 0.0 oz/week     Comment: couple times a month  . Drug use: No  . Sexual activity: Not on file   Other Topics Concern  . Not on file   Social History Narrative  . No narrative on file    Family History  Problem Relation Age of Onset  . Dementia Mother   . Stroke Father   . Diabetes Paternal Grandmother   . Stroke Paternal Aunt     x 2  . Cancer Paternal Aunt     type unknown    ROS- All systems are reviewed and negative except as per the HPI above  Physical  Exam: Vitals:   09/19/15 1055  BP: (!) 160/96  Weight: 246 lb (111.6 kg)  Height: 5' 11.5" (1.816 m)  BP recheck 130/80, HR in upper 90's  GEN- The patient is well appearing, alert and oriented x 3 today.   Head- normocephalic, atraumatic Eyes-  Sclera clear, conjunctiva pink Ears- hearing intact Oropharynx- clear Neck- supple, no JVP Lymph- no cervical lymphadenopathy Lungs- Clear to ausculation bilaterally, normal work of breathing Heart-regular rate and rhythm, no murmurs, rubs or gallops, PMI not laterally displaced. PPM site with old bruising, mild swelling, no redness, d/c Steri strips in place GI- soft, NT, ND, + BS Extremities- no clubbing, cyanosis, or edema MS- no significant deformity or atrophy Skin- no rash or lesion Psych- euthymic mood, full affect Neuro- strength and sensation are intact  EKG- atrial paced rhythm at 60 bpm   Assessment and Plan: 1. Afib Now back in SR DCCV for tomorrow cancelled Add cardizem 120  mg a day now that BP is higher and to maintain SR Can still use 30 mg cardizem as needed if HR over 100 and sys BP over 100. Continue amiodarone 200 mg a day Continue xarelto Continue lis/hct 1/2 tab daily Repeat BMET Cpap titration completed last night  F/u with with Dr. Curt [name] 8/30   Erik [name], [facility] [address] [place], [place] [address] [phone]

## 2015-09-20 ENCOUNTER — Ambulatory Visit (HOSPITAL_COMMUNITY): Admission: RE | Admit: 2015-09-20 | Payer: Medicare Other | Source: Ambulatory Visit | Admitting: Cardiology

## 2015-09-20 ENCOUNTER — Encounter (HOSPITAL_COMMUNITY): Admission: RE | Payer: Self-pay | Source: Ambulatory Visit

## 2015-09-20 ENCOUNTER — Other Ambulatory Visit (HOSPITAL_COMMUNITY): Payer: Self-pay | Admitting: *Deleted

## 2015-09-20 SURGERY — CARDIOVERSION
Anesthesia: Monitor Anesthesia Care

## 2015-09-24 ENCOUNTER — Encounter (HOSPITAL_BASED_OUTPATIENT_CLINIC_OR_DEPARTMENT_OTHER): Payer: Self-pay | Admitting: Cardiovascular Disease

## 2015-09-24 NOTE — Procedures (Signed)
Patient Name: Erik [name], [place] Date: 09/18/2015 Gender: Male D.O.B: 1945/03/18 Age (years): 70 Referring Provider: Nadean Corwin Hilty Height (inches): 72 Interpreting Physician: Shelva Majestic MD, ABSM Weight (lbs): 240 RPSGT: Madelon Lips BMI: 33 MRN: [id number] Neck Size: 17.50  CLINICAL INFORMATION The patient is referred for a CPAP titration to treat sleep apnea.   Date of NPSG, Split Night or HST:  08/23/2015  AHI 69.1/hr; RDI 78.4/hr  SLEEP STUDY TECHNIQUE As per the AASM Manual for the Scoring of Sleep and Associated Events v2.3 (April 2016) with a hypopnea requiring 4% desaturations. The channels recorded and monitored were frontal, central and occipital EEG, electrooculogram (EOG), submentalis EMG (chin), nasal and oral airflow, thoracic and abdominal wall motion, anterior tibialis EMG, snore microphone, electrocardiogram, and pulse oximetry. Continuous positive airway pressure (CPAP) was initiated at the beginning of the study and titrated to treat sleep-disordered breathing.  MEDICATIONS acetaminophen (TYLENOL) 500 MG tablet Taking -- -- Historical Provider, MD amiodarone (PACERONE) 200 MG tablet Taking 09/16/15 -- Sherran Needs, NP Take 1 tablet (200 mg total) by mouth daily. Notes:  Increased frequency to BID.  Patient does not need filled at this time.  He will call when needs this med filled. diltiazem (CARDIZEM CD) 120 MG 24 hr capsule 09/19/15 -- Sherran Needs, NP Take 1 capsule (120 mg total) by mouth daily. diltiazem (CARDIZEM) 30 MG tablet Taking 09/16/15 -- Sherran Needs, NP Cardizem 30mg  -- take 1 tablet every 4 hours AS NEEDED for heart rate >100 as long as blood pressure >100. ibuprofen (ADVIL,MOTRIN) 200 MG tablet Taking -- -- Historical Provider, MD levothyroxine (SYNTHROID, LEVOTHROID) 75 MCG tablet Taking 08/22/15 -- [name], MD TAKE 1 TABLET (75 MCG TOTAL) BY MOUTH DAILY. Notes:  Pt needs office visit for future  refills losartan-hydrochlorothiazide (HYZAAR) 100-25 MG tablet Taking 09/16/15 -- Sherran Needs, NP Take 0.5 tablets by mouth daily. Notes:  Stopping losartan metoprolol tartrate (LOPRESSOR) 25 MG tablet Taking 07/01/15 -- Sherran Needs, NP Take 1 tablet (25 mg total) by mouth 2 (two) times daily. Multiple Vitamins-Minerals (MULTIVITAMIN WITH MINERALS) tablet Taking -- -- Historical Provider, MD rivaroxaban (XARELTO) 20 MG TABS tablet Taking 07/06/15 -- Will [name], MD Take 1 tablet (20 mg total) by mouth daily with supper. simvastatin (ZOCOR) 20 MG tablet  Medications administered by patient during sleep study : No sleep medicine administered.  TECHNICIAN COMMENTS Comments added by technician: BATHROOM BREAK X1  Comments added by scorer: N/A  RESPIRATORY PARAMETERS Optimal PAP Pressure (cm): 14 AHI at Optimal Pressure (/hr): 2.5 Overall Minimal O2 (%): 88.00 Supine % at Optimal Pressure (%): 100 Minimal O2 at Optimal Pressure (%): 90.0    SLEEP ARCHITECTURE The study was initiated at 10:32:54 PM and ended at 4:37:07 AM. Sleep onset time was 12.5 minutes and the sleep efficiency was 71.9%. The total sleep time was 262.0 minutes. The patient spent 7.82% of the night in stage N1 sleep, 61.83% in stage N2 sleep, 0.00% in stage N3 and 30.34% in REM.Stage REM latency was 30.5 minutes Wake after sleep onset was 89.7. Alpha intrusion was absent. Supine sleep was 100.00%.  CARDIAC DATA The 2 lead EKG demonstrated sinus rhythm, pacemaker generated. The mean heart rate was 60.07 beats per minute. Other EKG findings include: None.  LEG MOVEMENT DATA The total Periodic Limb Movements of Sleep (PLMS) were 0. The PLMS index was 0.00. A PLMS index of <15 is considered normal in adults.  IMPRESSIONS - CPAP was  started at 5 cm and was titrated to 14 cm water pressure. The optimal PAP pressure was 14 cm of water. AHI at 14 cm  was 2.5/h. - Central sleep apnea was not noted during this  titration (CAI = 1.6/h). - Mild oxygen desaturation to a nadir of  88.00%. - The patient snored with Moderate snoring volume during this titration study. - No cardiac abnormalities were observed during this study. - Clinically significant periodic limb movements were not noted during this study. Arousals associated with PLMs were rare.  DIAGNOSIS - Obstructive Sleep Apnea (327.23 [G47.33 ICD-10])  RECOMMENDATIONS - Trial of CPAP therapy on 14 cm H2O with heated humidification. Aa Medium size Fisher&Paykel Full Face Mask Simplus mask was used for the titration. - Avoid alcohol, sedatives and other CNS depressants that may worsen sleep apnea and disrupt normal sleep architecture. - Sleep hygiene should be reviewed to assess factors that may improve sleep quality. - Weight management and regular exercise should be initiated or continued. - Recommend a download be obtained in 30 days and sleep clinic evaluation.   [Electronically signed] 09/24/2015 02:23 PM  Shelva Majestic MD, Tomasa Hose Diplomate, American Board of Sleep Medicine   NPI: [id number] [place] PH: [phone]   FX: [phone] [place]

## 2015-09-27 ENCOUNTER — Ambulatory Visit: Payer: Medicare Other | Admitting: Family Medicine

## 2015-09-28 NOTE — Progress Notes (Signed)
Need to find out where order initiated. No notes found telling me who ordered and documented symptoms.

## 2015-09-29 ENCOUNTER — Encounter: Payer: Self-pay | Admitting: Family Medicine

## 2015-09-29 ENCOUNTER — Ambulatory Visit (INDEPENDENT_AMBULATORY_CARE_PROVIDER_SITE_OTHER): Payer: Medicare Other | Admitting: Family Medicine

## 2015-09-29 VITALS — BP 126/72 | HR 80 | Temp 98.3°F | Ht 71.5 in | Wt 245.0 lb

## 2015-09-29 DIAGNOSIS — E039 Hypothyroidism, unspecified: Secondary | ICD-10-CM

## 2015-09-29 DIAGNOSIS — I10 Essential (primary) hypertension: Secondary | ICD-10-CM

## 2015-09-29 DIAGNOSIS — I2510 Atherosclerotic heart disease of native coronary artery without angina pectoris: Secondary | ICD-10-CM | POA: Diagnosis not present

## 2015-09-29 DIAGNOSIS — I480 Paroxysmal atrial fibrillation: Secondary | ICD-10-CM

## 2015-09-29 DIAGNOSIS — K5900 Constipation, unspecified: Secondary | ICD-10-CM

## 2015-09-29 DIAGNOSIS — E785 Hyperlipidemia, unspecified: Secondary | ICD-10-CM

## 2015-09-29 LAB — BASIC METABOLIC PANEL
BUN: 24 mg/dL — AB (ref 6–23)
CALCIUM: 9.4 mg/dL (ref 8.4–10.5)
CO2: 29 mEq/L (ref 19–32)
Chloride: 104 mEq/L (ref 96–112)
Creatinine, Ser: 1.5 mg/dL (ref 0.40–1.50)
GFR: 49.38 mL/min — AB (ref >60.00)
Glucose, Bld: 98 mg/dL (ref 70–99)
POTASSIUM: 3.6 meq/L (ref 3.5–5.1)
SODIUM: 141 meq/L (ref 135–145)

## 2015-09-29 LAB — LIPID PANEL
CHOL/HDL RATIO: 3
Cholesterol: 133 mg/dL (ref 0–200)
HDL: 51.3 mg/dL (ref >39.00)
LDL CALC: 63 mg/dL (ref 0–99)
NonHDL: 81.57
Triglycerides: 95 mg/dL (ref 0.0–149.0)
VLDL: 19 mg/dL (ref 0.0–40.0)

## 2015-09-29 MED ORDER — LEVOTHYROXINE SODIUM 50 MCG PO TABS: 50.0000 ug | ORAL_TABLET | Freq: Every day | ORAL | 3 refills | Status: DC

## 2015-09-29 NOTE — Progress Notes (Signed)
   Subjective:    Patient ID: Erik [name], male    DOB: Jul 18, 1945, 70 y.o.   MRN: [id number]  HPI Here to follow up after extensive treatment for atrial fibrillation. He wound up getting a pacemaker put in on 06-27-15 and he has done well since then. He remains on Amiodarone and Xarelto. He feels well although he tires easily. No chest pain or SOB. His BP at home has been stable. He does mention chronic constipation and he has to strain to pass all his BMs. He often has rectal bleeding as well. He had a colonoscopy last September showing diverticulae and internal hemorrhoids but no polyps.    Review of Systems  Constitutional: Negative.   Respiratory: Negative.   Cardiovascular: Negative.   Gastrointestinal: Positive for anal bleeding and constipation. Negative for abdominal distention, abdominal pain, blood in stool, diarrhea, nausea, rectal pain and vomiting.  Genitourinary: Negative.   Neurological: Negative.        Objective:   Physical Exam  Constitutional: He is oriented to person, place, and time. He appears well-developed and well-nourished.  Neck: No thyromegaly present.  Cardiovascular: Normal rate, regular rhythm, normal heart sounds and intact distal pulses.   Pulmonary/Chest: Effort normal and breath sounds normal.  Abdominal: Soft. Bowel sounds are normal. He exhibits no distension and no mass. There is no tenderness. There is no rebound and no guarding.  Lymphadenopathy:    He has no cervical adenopathy.  Neurological: He is alert and oriented to person, place, and time.          Assessment & Plan:  His HTN is stable and the atrial fibrillation is well controlled. He is fasting today so we will check a lipid panel. He had a full thyroid panel run a few weeks ago showing an elevated free T4. We will decrease the dose of his Synthroid to 50 mcg daily. Recheck a panel in 90 days. For the constipation he will start using Miralax daily and drinking more water.    [name], MD

## 2015-09-29 NOTE — Progress Notes (Signed)
Pre visit review using our clinic review tool, if applicable. No additional management support is needed unless otherwise documented below in the visit note. 

## 2015-10-05 ENCOUNTER — Other Ambulatory Visit: Payer: Self-pay | Admitting: *Deleted

## 2015-10-05 DIAGNOSIS — I480 Paroxysmal atrial fibrillation: Secondary | ICD-10-CM

## 2015-10-05 DIAGNOSIS — G4733 Obstructive sleep apnea (adult) (pediatric): Secondary | ICD-10-CM

## 2015-10-05 DIAGNOSIS — I10 Essential (primary) hypertension: Secondary | ICD-10-CM

## 2015-10-05 NOTE — Progress Notes (Signed)
Orders placed into EPIC for CPAP set up through [facility].

## 2015-10-16 ENCOUNTER — Other Ambulatory Visit: Payer: Self-pay | Admitting: Family Medicine

## 2015-10-25 NOTE — Progress Notes (Signed)
Electrophysiology Office Note   Date:  10/26/2015   ID:  Torron, Fehrman Sep 03, 1945, MRN [id number]  PCP:  [name], MD  Cardiologist:  [name] Primary Electrophysiologist:  Klein Willcox [name], MD    Chief Complaint  Patient presents with  . Pacemaker Check  . Shortness of Breath     History of Present Illness: Erik [name] is a 70 y.o. male who presents today for electrophysiology evaluation.   He presents today for workup of his atrial fibrillation.  He has a history of nonobstructive coronary artery disease, hypertension, hyperlipidemia, hypothyroidism. In March 2016, he was diagnosed with atrial fibrillation with RVR in the setting of chest pain. He was loaded on tikosyn which was stopped due to QTc prolongation.  Had pacemaker placed for tachy-brady syndrome.    Today, he denies symptoms of palpitations, chest pain, orthopnea, PND, lower extremity edema, claudication, dizziness, presyncope, syncope, bleeding, or neurologic sequela. The patient is tolerating medications without difficulties.  He currently complains of shortness of breath. His shortness of breath has worsened throughout the year. He says that he was able to mow his lawn last Monday, but was wiped out for the rest of the day. Shortness of breath is mainly associated with exertion. He has been trying to wear his CPAP and has been wearing it a few hours at night.   Past Medical History:  Diagnosis Date  . BPH with urinary obstruction   . Carotid disease, bilateral ([place])    a. 09/2014 Carotid U/S: 1-39% bilat ICA stenosis.  . Chronic lower back pain   . CKD (chronic kidney disease), stage III   . Diverticulosis   . Gout   . Hyperlipidemia   . Hypertensive heart disease   . Hypothyroidism   . Internal hemorrhoids   . Nephrolithiasis   . Non-obstructive CAD    a. 07/2002 Cath: LM 20, LAD 50m/d, LCX 50-56m, OM1 79m, RCA 65m, EF 60%; b. 05/2014 MV: low risk w/ small sized, mild intensity rev defect in  apical/inferior/infsept area, nl EF->Med Rx.  . Paroxysmal atrial fibrillation ([place])    a. Dx 04/2014; b. 05/2014 Echo: Ef 60-65%, no rwma, triv MR/TR, nl RV;  c. CHA2DS2VASc = 3-->was on eliquis, switched to xarelto 04/2015 2/2 cost; d. 05/2015 Tikosyn loaded w/ conversion to AF; e. 06/2015 QTc prolongation and bradycardia->tikosyn d/c'd, PPM placed, Amio started; f. 07/01/2015 In Aflutter @ clinic f/u.  [name] Paroxysmal atrial flutter ([place])    a. 06/2015 noted to be in rapid Aflutter in Afib clinic-->amio load continued.  . Tachy-brady syndrome ([place])    a. 06/27/2015 s/p SJM DC PPM (ser # @ [id number]).   Past Surgical History:  Procedure Laterality Date  . COLONOSCOPY  06/27/2015   per Dr. Henrene Pastor, internal hemorrhoids and diverticulae, repeat 10 yrs  . EP IMPLANTABLE DEVICE N/A 06/27/2015   Procedure: Pacemaker Implant;  Surgeon: Evans Lance, MD;  Location: [facility];  Service: Cardiovascular;  Laterality: N/A;  . HAND SURGERY Right   . LUMBAR DISC SURGERY  1998   2 lumbar discs, Dr. Ellene Route      Current Outpatient Prescriptions  Medication Sig Dispense Refill  . acetaminophen (TYLENOL) 500 MG tablet Take 1,000 mg by mouth every 6 (six) hours as needed. Reported on 07/06/2015    . amiodarone (PACERONE) 200 MG tablet Take 1 tablet (200 mg total) by mouth daily. 180 tablet 1  . diltiazem (CARDIZEM CD) 120 MG 24 hr capsule Take 1 capsule (120  mg total) by mouth daily. 30 capsule 6  . diltiazem (CARDIZEM) 30 MG tablet Cardizem 30mg  -- take 1 tablet every 4 hours AS NEEDED for heart rate >100 as long as blood pressure >100. 45 tablet 1  . ibuprofen (ADVIL,MOTRIN) 200 MG tablet Take 200 mg by mouth every 8 (eight) hours as needed.    [name] levothyroxine (SYNTHROID, LEVOTHROID) 50 MCG tablet Take 1 tablet (50 mcg total) by mouth daily. 90 tablet 3  . losartan-hydrochlorothiazide (HYZAAR) 100-25 MG tablet Take 0.5 tablets by mouth daily. 30 tablet 6  . metoprolol tartrate (LOPRESSOR) 25 MG tablet Take 1  tablet (25 mg total) by mouth 2 (two) times daily. 60 tablet 4  . Multiple Vitamins-Minerals (MULTIVITAMIN WITH MINERALS) tablet Take 1 tablet by mouth daily.    . rivaroxaban (XARELTO) 20 MG TABS tablet Take 1 tablet (20 mg total) by mouth daily with supper. 90 tablet 3  . simvastatin (ZOCOR) 20 MG tablet Take 0.5 tablets (10 mg total) by mouth daily. 90 tablet 3   No current facility-administered medications for this visit.     Allergies:   Review of patient's allergies indicates no known allergies.   Social History:  The patient  reports that he quit smoking about 20 years ago. His smoking use included Cigarettes. He has never used smokeless tobacco. He reports that he drinks alcohol. He reports that he does not use drugs.   Family History:  The patient's family history includes Cancer in his paternal aunt; Dementia in his mother; Diabetes in his paternal grandmother; Stroke in his father and paternal aunt.    ROS:  Please see the history of present illness.   Otherwise, review of systems is positive for none.   All other systems are reviewed and negative.    PHYSICAL EXAM: VS:  BP 122/80   Pulse 60   Ht 5' 11.5" (1.816 m)   Wt 245 lb (111.1 kg)   BMI 33.69 kg/m  , BMI Body mass index is 33.69 kg/m. GEN: Well nourished, well developed, in no acute distress  HEENT: normal  Neck: no JVD, carotid bruits, or masses Cardiac: RRR; no murmurs, rubs, or gallops,no edema  Respiratory:  clear to auscultation bilaterally, normal work of breathing GI: soft, nontender, nondistended, + BS MS: no deformity or atrophy  Skin: warm and dry,  Device site well healed with small hematoma under the incision Neuro:  Strength and sensation are intact Psych: euthymic mood, full affect  EKG:  EKG is ordered today. Personal review of the ekg ordered today shows A paced, T wave flattening, rate 60   Recent Labs: 06/28/2015: Magnesium 1.8 09/16/2015: ALT 50; Hemoglobin 15.4; Platelets 247; TSH  2.491 09/29/2015: BUN 24; Creatinine, Ser 1.50; Potassium 3.6; Sodium 141    Lipid Panel     Component Value Date/Time   CHOL 133 09/29/2015 0916   TRIG 95.0 09/29/2015 0916   TRIG 203 (HH) 12/10/2005 0839   HDL 51.30 09/29/2015 0916   CHOLHDL 3 09/29/2015 0916   VLDL 19.0 09/29/2015 0916   LDLCALC 63 09/29/2015 0916   LDLDIRECT 104.9 06/09/2009 0919     Wt Readings from Last 3 Encounters:  10/26/15 245 lb (111.1 kg)  09/29/15 245 lb (111.1 kg)  09/19/15 246 lb (111.6 kg)      Other studies Reviewed: Additional studies/ records that were reviewed today include: TTE 06/03/14, Myoview 06/03/14 Review of the above records today demonstrates:  - Normal biventricular size and function. Moderate concentric LVH. Trivial mitral  and tricupsid regurgitation, otherwise normal study.  Low risk stress nuclear study with a small sized, mild intensity reversible defect concering for mild apical-inferior/inferoseptal ischeima. Clinical Correlation is recommended..   ASSESSMENT AND PLAN:  1.  Paroxysmal atrial fibrillation: Currently on Xarelto for a CHADS2VASc of 2. Was previously loaded on tikosyn, not tolerated due to QTc prolongation.  Went back into sinus rhythm and did not have cardioversion.  This patients CHA2DS2-VASc Score and unadjusted Ischemic Stroke Rate (% per year) is equal to 2.2 % stroke rate/year from a score of 2  Above score calculated as 1 point each if present [CHF, HTN, DM, Vascular=MI/PAD/Aortic Plaque, Age if 65-74, or Male] Above score calculated as 2 points each if present [Age > 75, or Stroke/TIA/TE]  2. Hypertension: Well-controlled  3.  Sinus node dysfunction:  Pacemaker implanted and working without issues.   4. Shortness of breath: unclear the cause.  Have made his rate responsiveness more aggressive.  Erik [name] also get a CXR to further evaluate.  May require pulmonary referral.  Current medicines are reviewed at length with the patient today.   The  patient does not have concerns regarding his medicines.  The following changes were made today:  none  Labs/ tests ordered today include:  No orders of the defined types were placed in this encounter.    Disposition:   FU with Erik [name] 9 months  Signed, Ahnika Hannibal [name], MD  10/26/2015 11:04 AM     [facility] [address] [address] [phone] (office) [phone] (faHe was referred by by his primaryx)

## 2015-10-26 ENCOUNTER — Ambulatory Visit (INDEPENDENT_AMBULATORY_CARE_PROVIDER_SITE_OTHER): Payer: Medicare Other | Admitting: Cardiology

## 2015-10-26 ENCOUNTER — Encounter: Payer: Self-pay | Admitting: Cardiology

## 2015-10-26 ENCOUNTER — Ambulatory Visit (HOSPITAL_COMMUNITY)
Admission: RE | Admit: 2015-10-26 | Discharge: 2015-10-26 | Disposition: A | Discharge status: Home / Self Care | Payer: Medicare Other | Source: Ambulatory Visit | Attending: Cardiology | Admitting: Cardiology

## 2015-10-26 VITALS — BP 122/80 | HR 60 | Ht 71.5 in | Wt 245.0 lb

## 2015-10-26 DIAGNOSIS — I480 Paroxysmal atrial fibrillation: Secondary | ICD-10-CM

## 2015-10-26 DIAGNOSIS — R0602 Shortness of breath: Secondary | ICD-10-CM | POA: Insufficient documentation

## 2015-10-26 DIAGNOSIS — I2510 Atherosclerotic heart disease of native coronary artery without angina pectoris: Secondary | ICD-10-CM | POA: Diagnosis not present

## 2015-10-26 LAB — CUP PACEART INCLINIC DEVICE CHECK
Battery Remaining Longevity: 106.8
Battery Voltage: 3.01 V
Brady Statistic RA Percent Paced: 80 %
Date Time Interrogation Session: 20170830150116
Implantable Lead Implant Date: 20170501
Implantable Lead Location: 753860
Lead Channel Pacing Threshold Amplitude: 0.875 V
Lead Channel Setting Pacing Amplitude: 1.875
Lead Channel Setting Pacing Amplitude: 2.5 V
Lead Channel Setting Pacing Pulse Width: 0.5 ms
Lead Channel Setting Sensing Sensitivity: 2 mV
MDC IDC LEAD IMPLANT DT: 20170501
MDC IDC LEAD LOCATION: 753859
MDC IDC MSMT LEADCHNL RA IMPEDANCE VALUE: 450 Ohm
MDC IDC MSMT LEADCHNL RA PACING THRESHOLD PULSEWIDTH: 0.5 ms
MDC IDC MSMT LEADCHNL RA SENSING INTR AMPL: 1.5 mV
MDC IDC MSMT LEADCHNL RV IMPEDANCE VALUE: 550 Ohm
MDC IDC MSMT LEADCHNL RV PACING THRESHOLD AMPLITUDE: 1 V
MDC IDC MSMT LEADCHNL RV PACING THRESHOLD PULSEWIDTH: 0.5 ms
MDC IDC MSMT LEADCHNL RV SENSING INTR AMPL: 12 mV
MDC IDC PG SERIAL: 7897836
MDC IDC STAT BRADY RV PERCENT PACED: 4.3 %
Pulse Gen Model: 2272

## 2015-10-26 NOTE — Patient Instructions (Signed)
Medication Instructions:  Your physician recommends that you continue on your current medications as directed. Please refer to the Current Medication list given to you today.  Labwork: None ordered  Testing/Procedures: A chest x-ray takes a picture of the organs and structures inside the chest, including the heart, lungs, and blood vessels. This test can show several things, including, whether the heart is enlarges; whether fluid is building up in the lungs; and whether pacemaker / defibrillator leads are still in place.  Follow-Up: Remote monitoring is used to monitor your Pacemaker of ICD from home. This monitoring reduces the number of office visits required to check your device to one time per year. It allows [place] to keep an eye on the functioning of your device to ensure it is working properly. You are scheduled for a device check from home on 01/25/16. You may send your transmission at any time that day. If you have a wireless device, the transmission will be sent automatically. After your physician reviews your transmission, you will receive a postcard with your next transmission date.  Your physician wants you to follow-up in: 9 months with Dr. Curt Bears. You will receive a reminder letter in the mail two months in advance. If you don't receive a letter, please call our office to schedule the follow-up appointment.  If you need a refill on your cardiac medications before your next appointment, please call your pharmacy.  Thank you for choosing [facility]!!   Trinidad Curet, RN [phone]

## 2015-10-26 NOTE — Addendum Note (Signed)
Addended by: Stanton Kidney on: 10/26/2015 11:42 AM   Modules accepted: Orders

## 2015-10-27 ENCOUNTER — Telehealth: Payer: Self-pay | Admitting: *Deleted

## 2015-10-27 DIAGNOSIS — R0600 Dyspnea, unspecified: Secondary | ICD-10-CM

## 2015-10-27 NOTE — Telephone Encounter (Signed)
-----   Message from Will [name], MD sent at 10/26/2015  2:08 PM EDT ----- No major abnormalities on CXR.  Should repeat TTE.

## 2015-10-27 NOTE — Addendum Note (Signed)
Addended by: Stanton Kidney on: 10/27/2015 05:41 PM   Modules accepted: Orders

## 2015-10-27 NOTE — Telephone Encounter (Signed)
Advised of results and to repeat echo. Patient is agreeable and understands office will call to arrange.

## 2015-10-27 NOTE — Telephone Encounter (Signed)
Informed patient that we would be making a pulmonary referral for his SOB.  Patient thanks [place] for helping.  He understands someone will call him to arrange referral.

## 2015-10-28 NOTE — Telephone Encounter (Signed)
Pulmonary Referral:  Armando Gang  Sent: Fri October 28, 2015 10:03 AM  To: Stanton Kidney, RN            Message   11-16-15 @ 10:15 With Dr. Vaughan Browner  - patient family is aware

## 2015-11-10 ENCOUNTER — Encounter (INDEPENDENT_AMBULATORY_CARE_PROVIDER_SITE_OTHER): Payer: Self-pay

## 2015-11-10 ENCOUNTER — Other Ambulatory Visit: Payer: Self-pay

## 2015-11-10 ENCOUNTER — Ambulatory Visit (HOSPITAL_COMMUNITY): Payer: Medicare Other | Attending: Cardiology

## 2015-11-10 DIAGNOSIS — I340 Nonrheumatic mitral (valve) insufficiency: Secondary | ICD-10-CM | POA: Diagnosis not present

## 2015-11-10 DIAGNOSIS — R0600 Dyspnea, unspecified: Secondary | ICD-10-CM

## 2015-11-10 DIAGNOSIS — I313 Pericardial effusion (noninflammatory): Secondary | ICD-10-CM | POA: Diagnosis not present

## 2015-11-16 ENCOUNTER — Ambulatory Visit (INDEPENDENT_AMBULATORY_CARE_PROVIDER_SITE_OTHER): Payer: Medicare Other | Admitting: Pulmonary Disease

## 2015-11-16 ENCOUNTER — Encounter: Payer: Self-pay | Admitting: Pulmonary Disease

## 2015-11-16 VITALS — BP 114/74 | HR 68 | Ht 71.5 in | Wt 246.4 lb

## 2015-11-16 DIAGNOSIS — R0600 Dyspnea, unspecified: Secondary | ICD-10-CM

## 2015-11-16 DIAGNOSIS — Z23 Encounter for immunization: Secondary | ICD-10-CM

## 2015-11-16 MED ORDER — FLUTICASONE-SALMETEROL 250-50 MCG/DOSE IN AEPB: 1.0000 | INHALATION_SPRAY | Freq: Two times a day (BID) | RESPIRATORY_TRACT | 5 refills | Status: DC

## 2015-11-16 NOTE — Patient Instructions (Addendum)
We will start on Advair 250/50. Please take 2 puffs twice daily. We will schedule you for full pulmonary function tests.  Return to clinic in 1 month to review response to therapy and tests.

## 2015-11-16 NOTE — Progress Notes (Signed)
Erik [name]    [id number]    [birth year]  Primary Care Physician:FRY,STEPHEN A, MD  Referring Physician: [name], MD [address] [place], [place] [address]  Chief complaint:  Consult for evaluation of dyspnea.  HPI: Erik [name] is a 70 year old with past medical history of atrial fibrillation, smoking history. He has been referred for evaluation of dyspnea on exertion. He's noticed these symptoms for the past 1 year but this has been worsening since May 2017. He complains of dyspnea on exertion and at rest. He does not have any clear aggravating, precipitating factors. He has occasional wheezing, no cough, sputum production.  He's noticed a weight gain of about 10-20 pounds over the past few years. He denies any seasonal allergies, sinus congestion, postnasal drip, heartburn. 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 He does not have any exposures at work or at home. He was diagnosed with atrial fibrillation in March 2016. He was initially given tikosyn but developed prolonged qTC. 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 He had a sleep study in July 2017 which showed severe OSA. He has been started on CPAP but has difficulty tolerating the mask.  Outpatient Encounter Prescriptions as of 11/16/2015  Medication Sig  . acetaminophen (TYLENOL) 500 MG tablet Take 1,000 mg by mouth every 6 (six) hours as needed. Reported on 07/06/2015  . amiodarone (PACERONE) 200 MG tablet Take 1 tablet (200 mg total) by mouth daily.  [name] diltiazem (CARDIZEM CD) 120 MG 24 hr capsule Take 1 capsule (120 mg total) by mouth daily.  [name] diltiazem (CARDIZEM) 30 MG tablet Cardizem 30mg  -- take 1 tablet every 4 hours AS NEEDED for heart rate >100 as long as blood pressure >100.  . ibuprofen (ADVIL,MOTRIN) 200 MG tablet Take 200 mg by mouth every 8 (eight) hours as needed.  [name] levothyroxine (SYNTHROID, LEVOTHROID) 50 MCG tablet Take 1  tablet (50 mcg total) by mouth daily.  [name] losartan-hydrochlorothiazide (HYZAAR) 100-25 MG tablet Take 0.5 tablets by mouth daily.  . metoprolol tartrate (LOPRESSOR) 25 MG tablet Take 1 tablet (25 mg total) by mouth 2 (two) times daily.  . Multiple Vitamins-Minerals (MULTIVITAMIN WITH MINERALS) tablet Take 1 tablet by mouth daily.  . rivaroxaban (XARELTO) 20 MG TABS tablet Take 1 tablet (20 mg total) by mouth daily with supper.  . simvastatin (ZOCOR) 20 MG tablet Take 0.5 tablets (10 mg total) by mouth daily.   No facility-administered encounter medications on file as of 11/16/2015.     Allergies as of 11/16/2015  . (No Known Allergies)    Past Medical History:  Diagnosis Date  . BPH with urinary obstruction   . Carotid disease, bilateral ([place])    a. 09/2014 Carotid U/S: 1-39% bilat ICA stenosis.  . Chronic lower back pain   . CKD (chronic kidney disease), stage III   . Diverticulosis   . Gout   . Hyperlipidemia   . Hypertensive heart disease   . Hypothyroidism   . Internal hemorrhoids   . Nephrolithiasis   . Non-obstructive CAD    a. 07/2002 Cath: LM 20, LAD 70m/d, LCX 50-30m, OM1 28m, RCA 65m, EF 60%; b. 05/2014 MV: low risk w/ small sized, mild intensity rev defect in apical/inferior/infsept area, nl EF->Med Rx.  . Paroxysmal atrial fibrillation ([place])    a. Dx 04/2014; b. 05/2014 Echo: Ef 60-65%, no rwma, triv MR/TR, nl  RV;  c. CHA2DS2VASc = 3-->was on eliquis, switched to xarelto 04/2015 2/2 cost; d. 05/2015 Tikosyn loaded w/ conversion to AF; e. 06/2015 QTc prolongation and bradycardia->tikosyn d/c'd, PPM placed, Amio started; f. 07/01/2015 In Aflutter @ clinic f/u.  [name] Paroxysmal atrial flutter ([place])    a. 06/2015 noted to be in rapid Aflutter in Afib clinic-->amio load continued.  . Tachy-brady syndrome ([place])    a. 06/27/2015 s/p SJM DC PPM (ser # @ [id number]).    Past Surgical History:  Procedure Laterality Date  . COLONOSCOPY  06/27/2015   per Dr. Henrene Pastor, internal hemorrhoids and  diverticulae, repeat 10 yrs  . EP IMPLANTABLE DEVICE N/A 06/27/2015   Procedure: Pacemaker Implant;  Surgeon: Evans Lance, MD;  Location: [facility];  Service: Cardiovascular;  Laterality: N/A;  . HAND SURGERY Right   . LUMBAR DISC SURGERY  1998   2 lumbar discs, Dr. Ellene Route     Family History  Problem Relation Age of Onset  . Dementia Mother   . Stroke Father   . Diabetes Paternal Grandmother   . Stroke Paternal Aunt     x 2  . Cancer Paternal Aunt     type unknown    Social History   Social History  . Marital status: Married    Spouse name: N/A  . Number of children: 1  . Years of education: N/A   Occupational History  . retired    Social History Main Topics  . Smoking status: Former Smoker    Types: Cigarettes    Quit date: 02/27/1995  . Smokeless tobacco: Never Used  . Alcohol use 0.0 oz/week     Comment: couple times a month  . Drug use: No  . Sexual activity: Not on file   Other Topics Concern  . Not on file   Social History Narrative  . No narrative on file   Review of systems: Review of Systems  Constitutional: Negative for fever and chills.  HENT: Negative.   Eyes: Negative for blurred vision.  Respiratory: as per HPI  Cardiovascular: Negative for chest pain and palpitations.  Gastrointestinal: Negative for vomiting, diarrhea, blood per rectum. Genitourinary: Negative for dysuria, urgency, frequency and hematuria.  Musculoskeletal: Negative for myalgias, back pain and joint pain.  Skin: Negative for itching and rash.  Neurological: Negative for dizziness, tremors, focal weakness, seizures and loss of consciousness.  Endo/Heme/Allergies: Negative for environmental allergies.  Psychiatric/Behavioral: Negative for depression, suicidal ideas and hallucinations.  All other systems reviewed and are negative.   Physical Exam: Blood pressure 114/74, pulse 68, height 5' 11.5" (1.816 m), weight 111.8 kg (246 lb 6.4 oz), SpO2 93 %. Gen:      No  acute distress HEENT:  EOMI, sclera anicteric Neck:     No masses; no thyromegaly Lungs:    Clear to auscultation bilaterally; normal respiratory effort CV:         Regular rate and rhythm; no murmurs Abd:      + bowel sounds; soft, non-tender; no palpable masses, no distension Ext:    No edema; adequate peripheral perfusion Skin:      Warm and dry; no rash Neuro: alert and oriented x 3 Psych: normal mood and affect  Data Reviewed: CXR- 10/26/15. No acute lung abnormality. Images reviewed.  Sleep study 08/28/15 Severe OSA, AHI 69, no significant central apnea Significant oxygen desaturation to 79%  CPAP titration 09/18/15 CPAP was started at 5 cm and was titrated to 14 cm water pressure.  The optimal PAP pressure was 14 cm of water. AHI at 14 cm  was 2.5/h.  Assessment:  Dyspnea He may have COPD based on his smoking history and symptoms. I'll schedule him for pulmonary function tests and start him on Advair 250/50. Return to clinic in 1 month to review response to therapy, PFTs. If he continues to be symptomatic he may benefit from a cardiopulmonary exercise test to delineate the etiology of dyspnea.  He is on amiodarone since May 2017. However his recent chest x-ray does not show any lung infiltrate suggestive of amiodarone toxicity. We'll continue to monitor and consider CT scan if the PFTs showed a restrictive abnormality.  OSA Recent diagnosis of severe OSA. He is intolerant of the CPAP due to feeling of suffocation. He is working with his DME provider to get mask fitting done.  Plan/Recommendations: - Schedule PFTs - Start Advair 250/50 - Continue CPAP  Marshell Garfinkel MD [facility] Pager [phone] 11/16/2015, 10:22 AM  CC: [name], MD

## 2015-11-17 ENCOUNTER — Telehealth: Payer: Self-pay | Admitting: Pulmonary Disease

## 2015-11-17 DIAGNOSIS — G4733 Obstructive sleep apnea (adult) (pediatric): Secondary | ICD-10-CM

## 2015-11-17 NOTE — Telephone Encounter (Signed)
Pt seen yesterday 9.20.17 PM asked for a CPAP download to be obtained at pt's next ov - pt is not enrolled in [facility] No DME on file for pt - we did not start CPAP LMOM TCB x1 to ask for DME company

## 2015-11-17 NOTE — Progress Notes (Signed)
Patient seen in the office today and instructed on use of Advair 250/50.  Patient expressed understanding and demonstrated technique. Parke Poisson [facility]  11/16/15

## 2015-11-18 NOTE — Telephone Encounter (Deleted)
Also, PM would like copy of his CPAP titration.  Need to ask pt what provider manages his OSA/CPAP.

## 2015-11-27 ENCOUNTER — Other Ambulatory Visit: Payer: Self-pay | Admitting: Family Medicine

## 2015-11-28 NOTE — Telephone Encounter (Signed)
Spoke with pt and he states that he uses [facility]. Chart updated and order placed for DL to be obtained before next OV.

## 2015-12-11 ENCOUNTER — Other Ambulatory Visit: Payer: Self-pay | Admitting: Family Medicine

## 2015-12-12 NOTE — Telephone Encounter (Signed)
This rx was changed by Roderic Palau, NP in Cards. Note sent to pharmacy.

## 2015-12-21 ENCOUNTER — Encounter: Payer: Self-pay | Admitting: Pulmonary Disease

## 2015-12-21 ENCOUNTER — Encounter (INDEPENDENT_AMBULATORY_CARE_PROVIDER_SITE_OTHER): Payer: Medicare Other | Admitting: Pulmonary Disease

## 2015-12-21 ENCOUNTER — Ambulatory Visit (INDEPENDENT_AMBULATORY_CARE_PROVIDER_SITE_OTHER): Payer: Medicare Other | Admitting: Pulmonary Disease

## 2015-12-21 ENCOUNTER — Telehealth (HOSPITAL_COMMUNITY): Payer: Self-pay | Admitting: Vascular Surgery

## 2015-12-21 VITALS — BP 122/68 | HR 96 | Ht 70.5 in | Wt 245.0 lb

## 2015-12-21 DIAGNOSIS — R0600 Dyspnea, unspecified: Secondary | ICD-10-CM | POA: Diagnosis not present

## 2015-12-21 DIAGNOSIS — I2510 Atherosclerotic heart disease of native coronary artery without angina pectoris: Secondary | ICD-10-CM

## 2015-12-21 DIAGNOSIS — R0602 Shortness of breath: Secondary | ICD-10-CM | POA: Diagnosis not present

## 2015-12-21 DIAGNOSIS — G4733 Obstructive sleep apnea (adult) (pediatric): Secondary | ICD-10-CM | POA: Diagnosis not present

## 2015-12-21 LAB — PULMONARY FUNCTION TEST
DL/VA % PRED: 73 %
DL/VA: 3.42 ml/min/mmHg/L
DLCO COR: 22.29 ml/min/mmHg
DLCO UNC % PRED: 70 %
DLCO cor % pred: 67 %
DLCO unc: 23.23 ml/min/mmHg
FEF 25-75 PRE: 1.73 L/s
FEF 25-75 Post: 2.5 L/sec
FEF2575-%CHANGE-POST: 44 %
FEF2575-%PRED-POST: 96 %
FEF2575-%Pred-Pre: 67 %
FEV1-%CHANGE-POST: 5 %
FEV1-%Pred-Post: 88 %
FEV1-%Pred-Pre: 83 %
FEV1-Post: 2.97 L
FEV1-Pre: 2.81 L
FEV1FVC-%CHANGE-POST: 4 %
FEV1FVC-%Pred-Pre: 99 %
FEV6-%CHANGE-POST: 2 %
FEV6-%PRED-PRE: 87 %
FEV6-%Pred-Post: 89 %
FEV6-PRE: 3.78 L
FEV6-Post: 3.86 L
FEV6FVC-%Change-Post: 1 %
FEV6FVC-%PRED-PRE: 103 %
FEV6FVC-%Pred-Post: 105 %
FVC-%Change-Post: 0 %
FVC-%PRED-POST: 84 %
FVC-%PRED-PRE: 84 %
FVC-POST: 3.86 L
FVC-PRE: 3.84 L
POST FEV1/FVC RATIO: 77 %
POST FEV6/FVC RATIO: 100 %
Pre FEV1/FVC ratio: 73 %
Pre FEV6/FVC Ratio: 99 %
RV % PRED: 92 %
RV: 2.26 L
TLC % pred: 89 %
TLC: 6.37 L

## 2015-12-21 NOTE — Patient Instructions (Signed)
The pulmonary function tests were reviewed with you today. It is okay to stop taking the Advair since it is not helping with her shortness of breath. We will schedule you for cardiopulmonary exercise test to further evaluate the etiology of dyspnea.  Return in 3 months

## 2015-12-21 NOTE — Progress Notes (Signed)
Erik [name]    [id number]    1945/08/05  Primary Care Physician:Erik A, MD  Referring Physician: [name], MD [place], [place] [address]  Chief complaint:  Follow up for evaluation of dyspnea.  HPI: [name] is [name] 70 year old with past medical history of atrial fibrillation, smoking history. He has been referred for evaluation of dyspnea on exertion. He's noticed these symptoms for the past 1 year but this has been worsening since May 2017. He complains of dyspnea on exertion and at rest. He does not have any clear aggravating, precipitating factors. He has occasional wheezing, no cough, sputum production.  He's noticed [name] weight gain of about 10-20 pounds over the past few years. He denies any seasonal allergies, sinus congestion, postnasal drip, heartburn. 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 He does not have any exposures at work or at home. He was diagnosed with atrial fibrillation in March 2016. He was initially given tikosyn but developed prolonged qTC. 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 He had [name] sleep study in July 2017 which showed severe OSA. He has been started on CPAP but has difficulty tolerating the mask.  Interim history: He was given Advair at last visit. He is using this regularly but has not noticed any change in his symptoms. He has stopped using the CPAP due to discomfort with the mask.   Outpatient Encounter Prescriptions as of 12/21/2015  Medication Sig  . acetaminophen (TYLENOL) 500 MG tablet Take 1,000 mg by mouth every 6 (six) hours as needed. Reported on 07/06/2015  . amiodarone (PACERONE) 200 MG tablet Take 1 tablet (200 mg total) by mouth daily.  [name] diltiazem (CARDIZEM CD) 120 MG 24 hr capsule Take 1 capsule (120 mg total) by mouth daily.  [name] diltiazem (CARDIZEM) 30 MG tablet Cardizem 30mg  -- take 1 tablet every 4 hours AS NEEDED for heart rate  >100 as long as blood pressure >100.  [name] Fluticasone-Salmeterol (ADVAIR DISKUS) 250-50 MCG/DOSE AEPB Inhale 1 puff into the lungs 2 (two) times daily.  [name] ibuprofen (ADVIL,MOTRIN) 200 MG tablet Take 200 mg by mouth every 8 (eight) hours as needed.  [name] levothyroxine (SYNTHROID, LEVOTHROID) 50 MCG tablet Take 1 tablet (50 mcg total) by mouth daily.  [name] losartan-hydrochlorothiazide (HYZAAR) 100-25 MG tablet Take 0.5 tablets by mouth daily.  . metoprolol tartrate (LOPRESSOR) 25 MG tablet Take 1 tablet (25 mg total) by mouth 2 (two) times daily.  . Multiple Vitamins-Minerals (MULTIVITAMIN WITH MINERALS) tablet Take 1 tablet by mouth daily.  . rivaroxaban (XARELTO) 20 MG TABS tablet Take 1 tablet (20 mg total) by mouth daily with supper.  . simvastatin (ZOCOR) 20 MG tablet Take 0.5 tablets (10 mg total) by mouth daily.   No facility-administered encounter medications on file as of 12/21/2015.     Allergies as of 12/21/2015  . (No Known Allergies)    Past Medical History:  Diagnosis Date  . BPH with urinary obstruction   . Carotid disease, bilateral ([place])    [name]. 09/2014 Carotid U/S: 1-39% bilat ICA stenosis.  . Chronic lower back pain   . CKD (chronic kidney disease), stage III   . Diverticulosis   . Gout   . Hyperlipidemia   . Hypertensive heart disease   . Hypothyroidism   . Internal hemorrhoids   . Nephrolithiasis   . Non-obstructive CAD  [name]. 07/2002 Cath: LM 20, LAD 22m/d, LCX 50-6m, OM1 72m, RCA 45m, EF 60%; b. 05/2014 MV: low risk w/ small sized, mild intensity rev defect in apical/inferior/infsept area, nl EF->Med Rx.  . Paroxysmal atrial fibrillation ([place])    [name]. Dx 04/2014; b. 05/2014 Echo: Ef 60-65%, no rwma, triv MR/TR, nl RV;  c. CHA2DS2VASc = 3-->was on eliquis, switched to xarelto 04/2015 2/2 cost; d. 05/2015 Tikosyn loaded w/ conversion to AF; e. 06/2015 QTc prolongation and bradycardia->tikosyn d/c'd, PPM placed, Amio started; f. 07/01/2015 In Aflutter @ clinic f/u.  [name] Paroxysmal atrial  flutter ([place])    [name]. 06/2015 noted to be in rapid Aflutter in Afib clinic-->amio load continued.  . Tachy-brady syndrome ([place])    [name]. 06/27/2015 s/p SJM DC PPM (ser # @ [id number]).    Past Surgical History:  Procedure Laterality Date  . COLONOSCOPY  06/27/2015   per Dr. Henrene [name], internal hemorrhoids and diverticulae, repeat 10 yrs  . EP IMPLANTABLE DEVICE N/[name] 06/27/2015   Procedure: Pacemaker Implant;  Surgeon: Erik Lance, MD;  Location: [facility];  Service: Cardiovascular;  Laterality: N/[name];  . HAND SURGERY Right   . LUMBAR DISC SURGERY  1998   2 lumbar discs, Dr. Ellene [name]     Family History  Problem Relation Age of Onset  . Dementia Mother   . Stroke Father   . Diabetes Paternal Grandmother   . Stroke Paternal Aunt     x 2  . Cancer Paternal Aunt     type unknown    Social History   Social History  . Marital status: Married    Spouse name: N/[name]  . Number of children: 1  . Years of education: N/[name]   Occupational History  . retired    Social History Main Topics  . Smoking status: Former Smoker    Types: Cigarettes    Quit date: 02/27/1995  . Smokeless tobacco: Never Used  . Alcohol use 0.0 oz/week     Comment: couple times [name] month  . Drug use: No  . Sexual activity: Not on file   Other Topics Concern  . Not on file   Social History Narrative  . No narrative on file   Review of systems: Review of Systems  Constitutional: Negative for fever and chills.  HENT: Negative.   Eyes: Negative for blurred vision.  Respiratory: as per HPI  Cardiovascular: Negative for chest pain and palpitations.  Gastrointestinal: Negative for vomiting, diarrhea, blood per rectum. Genitourinary: Negative for dysuria, urgency, frequency and hematuria.  Musculoskeletal: Negative for myalgias, back pain and joint pain.  Skin: Negative for itching and rash.  Neurological: Negative for dizziness, tremors, focal weakness, seizures and loss of consciousness.  Endo/Heme/Allergies: Negative  for environmental allergies.  Psychiatric/Behavioral: Negative for depression, suicidal ideas and hallucinations.  All other systems reviewed and are negative.   Physical Exam: Blood pressure 114/74, pulse 68, height 5' 11.5" (1.816 m), weight 111.8 kg (246 lb 6.4 oz), SpO2 93 %. Gen:      No acute distress HEENT:  EOMI, sclera anicteric Neck:     No masses; no thyromegaly Lungs:    Clear to auscultation bilaterally; normal respiratory effort CV:         Regular rate and rhythm; no murmurs Abd:      + bowel sounds; soft, non-tender; no palpable masses, no distension Ext:    No edema; adequate peripheral perfusion Skin:      Warm and dry; no rash Neuro: alert  and oriented x 3 Psych: normal mood and affect  Data Reviewed: CXR- 10/26/15. No acute lung abnormality. Images reviewed.  Sleep study 08/28/15 Severe OSA, AHI 69, no significant central apnea Significant oxygen desaturation to 79%  CPAP titration 09/18/15 CPAP was started at 5 cm and was titrated to 14 cm water pressure. The optimal PAP pressure was 14 cm of water. AHI at 14 cm  was 2.5/h.  PFTs 12/21/15 FVC 2.86 (84%) FEV1 2.97 (80%) F/F 77 TLC 89% DLCO 70% Minimal obstructive airway disease, mild diffusion defect.  Assessment:  Dyspnea The shortness of breath is of unclear etiology. His PFTs were reviewed today and they show only minimal obstructive disease. He has not responded to the Advair and will stop it. I'll schedule him for cardiopulmonary exercise test to get [name] better idea of the etiology of dyspnea. I suspect his weight gain and deconditioning may have [name] major role in his symptoms.  He is on amiodarone since May 2017. However his recent chest x-ray does not show any lung infiltrate suggestive of amiodarone toxicity. Since the PFTs show mild diffusion defect I'll get CT of the chest for evaluation.  OSA Recent diagnosis of severe OSA. He is intolerant of the CPAP due to feeling of suffocation.     Plan/Recommendations: - CT of chest without contrast - CPET - OK to stop Advair 250/50  Marshell Garfinkel MD [facility] Pager [phone] 12/21/2015, 1:45 PM  CC: Erik Morale, MD

## 2015-12-21 NOTE — Telephone Encounter (Signed)
Left pt message to call back to schedule cpx

## 2016-01-23 ENCOUNTER — Ambulatory Visit (HOSPITAL_COMMUNITY): Payer: Medicare Other | Attending: Pulmonary Disease

## 2016-01-23 DIAGNOSIS — R0602 Shortness of breath: Secondary | ICD-10-CM | POA: Diagnosis not present

## 2016-01-30 ENCOUNTER — Encounter: Payer: Medicare Other | Admitting: *Deleted

## 2016-01-30 ENCOUNTER — Telehealth: Payer: Self-pay | Admitting: Cardiology

## 2016-01-30 DIAGNOSIS — R0600 Dyspnea, unspecified: Secondary | ICD-10-CM | POA: Diagnosis not present

## 2016-01-30 NOTE — Telephone Encounter (Signed)
LMOVM reminding pt to send remote transmission.   

## 2016-02-01 NOTE — Progress Notes (Signed)
Called spoke with patient's spouse [name].  Advised of lab results / recs as stated by PM.  Spouse verbalized understanding and denied any questions.

## 2016-02-10 ENCOUNTER — Encounter: Payer: Self-pay | Admitting: Cardiology

## 2016-03-07 ENCOUNTER — Ambulatory Visit (INDEPENDENT_AMBULATORY_CARE_PROVIDER_SITE_OTHER): Payer: Medicare Other | Admitting: *Deleted

## 2016-03-07 DIAGNOSIS — I4891 Unspecified atrial fibrillation: Secondary | ICD-10-CM | POA: Diagnosis not present

## 2016-03-07 NOTE — Progress Notes (Signed)
Remote pacemaker transmission.   

## 2016-03-08 ENCOUNTER — Encounter: Payer: Self-pay | Admitting: Cardiology

## 2016-03-20 ENCOUNTER — Other Ambulatory Visit (HOSPITAL_COMMUNITY): Payer: Self-pay | Admitting: Nurse Practitioner

## 2016-03-22 LAB — CUP PACEART REMOTE DEVICE CHECK
Battery Voltage: 2.99 V
Brady Statistic AP VP Percent: 3 %
Brady Statistic AS VP Percent: 1 %
Brady Statistic RA Percent Paced: 90 %
Brady Statistic RV Percent Paced: 4.4 %
Implantable Lead Implant Date: 20170501
Implantable Lead Location: 753860
Implantable Pulse Generator Implant Date: 20170501
Lead Channel Impedance Value: 530 Ohm
Lead Channel Pacing Threshold Amplitude: 0.875 V
Lead Channel Pacing Threshold Pulse Width: 0.5 ms
Lead Channel Sensing Intrinsic Amplitude: 12 mV
Lead Channel Setting Pacing Amplitude: 1.875
Lead Channel Setting Sensing Sensitivity: 2 mV
MDC IDC LEAD IMPLANT DT: 20170501
MDC IDC LEAD LOCATION: 753859
MDC IDC MSMT BATTERY REMAINING LONGEVITY: 104 mo
MDC IDC MSMT BATTERY REMAINING PERCENTAGE: 95.5 %
MDC IDC MSMT LEADCHNL RA IMPEDANCE VALUE: 460 Ohm
MDC IDC MSMT LEADCHNL RA SENSING INTR AMPL: 4.2 mV
MDC IDC MSMT LEADCHNL RV PACING THRESHOLD AMPLITUDE: 1 V
MDC IDC MSMT LEADCHNL RV PACING THRESHOLD PULSEWIDTH: 0.5 ms
MDC IDC SESS DTM: 20180110115434
MDC IDC SET LEADCHNL RV PACING AMPLITUDE: 2.5 V
MDC IDC SET LEADCHNL RV PACING PULSEWIDTH: 0.5 ms
MDC IDC STAT BRADY AP VS PERCENT: 95 %
MDC IDC STAT BRADY AS VS PERCENT: 2.5 %
Pulse Gen Serial Number: 7897836

## 2016-03-27 ENCOUNTER — Ambulatory Visit (INDEPENDENT_AMBULATORY_CARE_PROVIDER_SITE_OTHER): Payer: Medicare Other | Admitting: Pulmonary Disease

## 2016-03-27 ENCOUNTER — Encounter: Payer: Self-pay | Admitting: Pulmonary Disease

## 2016-03-27 VITALS — BP 138/80 | HR 64 | Ht 64.0 in

## 2016-03-27 DIAGNOSIS — R0600 Dyspnea, unspecified: Secondary | ICD-10-CM

## 2016-03-27 DIAGNOSIS — G4733 Obstructive sleep apnea (adult) (pediatric): Secondary | ICD-10-CM

## 2016-03-27 NOTE — Patient Instructions (Signed)
Return as needed Please work on your weight loss

## 2016-03-27 NOTE — Progress Notes (Signed)
.pm               Erik [name]    [id number]    02-28-1945  Primary Care Physician:Erik Sarajane Jews, MD  Referring Physician: [name], MD Erik [name], Erik [name] [id number]  Chief complaint:  Follow up for evaluation of dyspnea.  HPI: [name] is a 71 year old with past medical history of atrial fibrillation, smoking history. He has been referred for evaluation of dyspnea on exertion. He's noticed these symptoms for the past 1 year but this has been worsening since May 2017. He complains of dyspnea on exertion and at rest. He does not have any clear aggravating, precipitating factors. He has occasional wheezing, no cough, sputum production.  He's noticed a weight gain of about 10-20 pounds over the past few years. He denies any seasonal allergies, sinus congestion, postnasal drip, heartburn. 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 He does not have any exposures at work or at home. He was diagnosed with atrial fibrillation in March 2016. He was initially given tikosyn but developed prolonged qTC. 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 He had a sleep study in July 2017 which showed severe OSA. He has been started on CPAP but has difficulty tolerating the mask.  Interim history: He has stopped using the advair since it was not helping with her symptoms. He has stopped using the CPAP as well and send the machine back. He feels that he cannot get a good mask fit in spite of multiple different masks and does not want to try again. He had a CPST done which was suggestive of dyspnea secondary to body habitus. He continues to have dyspnea on exertion which is unchanged   Outpatient Encounter Prescriptions as of 03/27/2016  Medication Sig  . acetaminophen (TYLENOL) 500 MG tablet Take 1,000 mg by mouth every 6 (six) hours as needed. Reported on 07/06/2015  . amiodarone (PACERONE) 200 MG tablet Take 1 tablet (200 mg  total) by mouth daily.  [name] diltiazem (CARDIZEM CD) 120 MG 24 hr capsule TAKE 1 CAPSULE (120 MG TOTAL) BY MOUTH DAILY.  [name] diltiazem (CARDIZEM) 30 MG tablet Cardizem 30mg  -- take 1 tablet every 4 hours AS NEEDED for heart rate >100 as long as blood pressure >100.  [name] Fluticasone-Salmeterol (ADVAIR DISKUS) 250-50 MCG/DOSE AEPB Inhale 1 puff into the lungs 2 (two) times daily.  [name] ibuprofen (ADVIL,MOTRIN) 200 MG tablet Take 200 mg by mouth every 8 (eight) hours as needed.  [name] levothyroxine (SYNTHROID, LEVOTHROID) 50 MCG tablet Take 1 tablet (50 mcg total) by mouth daily.  [name] losartan-hydrochlorothiazide (HYZAAR) 100-25 MG tablet Take 0.5 tablets by mouth daily.  . metoprolol tartrate (LOPRESSOR) 25 MG tablet Take 1 tablet (25 mg total) by mouth 2 (two) times daily.  . Multiple Vitamins-Minerals (MULTIVITAMIN WITH MINERALS) tablet Take 1 tablet by mouth daily.  . rivaroxaban (XARELTO) 20 MG TABS tablet Take 1 tablet (20 mg total) by mouth daily with supper.  . simvastatin (ZOCOR) 20 MG tablet Take 0.5 tablets (10 mg total) by mouth daily.   No facility-administered encounter medications on file as of 03/27/2016.     Allergies as of 03/27/2016  . (No Known Allergies)    Past Medical History:  Diagnosis Date  . BPH with urinary obstruction   . Carotid disease, bilateral (Erik [name])    a. 09/2014 Carotid U/S: 1-39% bilat ICA stenosis.  . Chronic lower  back pain   . CKD (chronic kidney disease), stage III   . Diverticulosis   . Gout   . Hyperlipidemia   . Hypertensive heart disease   . Hypothyroidism   . Internal hemorrhoids   . Nephrolithiasis   . Non-obstructive CAD    a. 07/2002 Cath: LM 20, LAD 26m/d, LCX 50-72m, OM1 72m, RCA 6m, EF 60%; b. 05/2014 MV: low risk w/ small sized, mild intensity rev defect in apical/inferior/infsept area, nl EF->Med Rx.  . Paroxysmal atrial fibrillation ([place])    a. Dx 04/2014; b. 05/2014 Echo: Ef 60-65%, no rwma, triv MR/TR, nl RV;  c. CHA2DS2VASc = 3-->was on eliquis,  switched to xarelto 04/2015 2/2 cost; d. 05/2015 Tikosyn loaded w/ conversion to AF; e. 06/2015 QTc prolongation and bradycardia->tikosyn d/c'd, PPM placed, Amio started; f. 07/01/2015 In Aflutter @ clinic f/u.  [name] Paroxysmal atrial flutter ([place])    a. 06/2015 noted to be in rapid Aflutter in Afib clinic-->amio load continued.  . Tachy-brady syndrome ([place])    a. 06/27/2015 s/p SJM DC PPM (ser # @ [id number]).    Past Surgical History:  Procedure Laterality Date  . COLONOSCOPY  06/27/2015   per Dr. Henrene [name], internal hemorrhoids and diverticulae, repeat 10 yrs  . EP IMPLANTABLE DEVICE N/A 06/27/2015   Procedure: Pacemaker Implant;  Surgeon: Erik Lance, MD;  Location: [facility];  Service: Cardiovascular;  Laterality: N/A;  . HAND SURGERY Right   . LUMBAR DISC SURGERY  1998   2 lumbar discs, Dr. Ellene [name]     Family History  Problem Relation Age of Onset  . Dementia Mother   . Stroke Father   . Diabetes Paternal Grandmother   . Stroke Paternal Aunt     x 2  . Cancer Paternal Aunt     type unknown    Social History   Social History  . Marital status: Married    Spouse name: N/A  . Number of children: 1  . Years of education: N/A   Occupational History  . retired    Social History Main Topics  . Smoking status: Former Smoker    Types: Cigarettes    Quit date: 02/27/1995  . Smokeless tobacco: Never Used  . Alcohol use 0.0 oz/week     Comment: couple times a month  . Drug use: No  . Sexual activity: Not on file   Other Topics Concern  . Not on file   Social History Narrative  . No narrative on file   Review of systems: Review of Systems  Constitutional: Negative for fever and chills.  HENT: Negative.   Eyes: Negative for blurred vision.  Respiratory: as per HPI  Cardiovascular: Negative for chest pain and palpitations.  Gastrointestinal: Negative for vomiting, diarrhea, blood per rectum. Genitourinary: Negative for dysuria, urgency, frequency and hematuria.    Musculoskeletal: Negative for myalgias, back pain and joint pain.  Skin: Negative for itching and rash.  Neurological: Negative for dizziness, tremors, focal weakness, seizures and loss of consciousness.  Endo/Heme/Allergies: Negative for environmental allergies.  Psychiatric/Behavioral: Negative for depression, suicidal ideas and hallucinations.  All other systems reviewed and are negative.   Physical Exam: Blood pressure 138/80, pulse 64, height 5\' 4"  (1.626 m), SpO2 95 %. Gen:      No acute distress HEENT:  EOMI, sclera anicteric Neck:     No masses; no thyromegaly Lungs:    Clear to auscultation bilaterally; normal respiratory effort CV:  Regular rate and rhythm; no murmurs Abd:      + bowel sounds; soft, non-tender; no palpable masses, no distension Ext:    No edema; adequate peripheral perfusion Skin:      Warm and dry; no rash Neuro: alert and oriented x 3 Psych: normal mood and affect  Data Reviewed: CXR- 10/26/15. No acute lung abnormality. Images reviewed.  Sleep study 08/28/15 Severe OSA, AHI 69, no significant central apnea Significant oxygen desaturation to 79%  CPAP titration 09/18/15 CPAP was started at 5 cm and was titrated to 14 cm water pressure. The optimal PAP pressure was 14 cm of water. AHI at 14 cm  was 2.5/h.  PFTs 12/21/15 FVC 2.86 (84%) FEV1 2.97 (80%) F/F 77 TLC 89% DLCO 70% Minimal obstructive airway disease, mild diffusion defect.  CPST 01/08/16 Exercise testing with gas exchange demonstrates normal functional capacity when compared to matched sedentary norms. There is no indication for cardiopulmonary limitation. Given restrictive properties during pre-exercise spirometry and mild hypertensive response to exercise, patient appears primarily limited due to body habitus. HR response was significantly delayed for the work performed in early incremental exercise, this could also be playing a role in patient's symptoms and exercise  intolerance.  Assessment:  Dyspnea PFTs were reviewed and they show only minimal obstructive disease. His current pulmonary exercise test shows normal function. He appears to be limited probably secondary to body habitus and low heart rate response. I suspect his symptoms are mainly secondary to weight gain and deconditioning. I have asked him to start on an exercise program and a diet program.  He is on amiodarone since May 2017. However chest x-ray is normal and PFTs show only minimal changes. However since there is a mild impairment in diffusion defect I'll get a CT of the chest for further evaluation.  OSA Severe sleep apnea. He is intolerant of the CPAP and is not interested in trying again. I discussed dental appliances and he is unsure whether he would want to try this.    Plan/Recommendations: - CT of chest without contrast  Marshell Garfinkel MD [facility] Pager [phone] 03/27/2016, 12:32 PM  CC: Erik Morale, MD

## 2016-03-30 ENCOUNTER — Other Ambulatory Visit (HOSPITAL_COMMUNITY): Payer: Self-pay | Admitting: Nurse Practitioner

## 2016-04-29 ENCOUNTER — Other Ambulatory Visit: Payer: Self-pay | Admitting: Cardiovascular Disease

## 2016-05-14 ENCOUNTER — Other Ambulatory Visit (HOSPITAL_COMMUNITY): Payer: Self-pay | Admitting: *Deleted

## 2016-05-14 ENCOUNTER — Ambulatory Visit (INDEPENDENT_AMBULATORY_CARE_PROVIDER_SITE_OTHER): Payer: Medicare Other | Admitting: Family Medicine

## 2016-05-14 ENCOUNTER — Encounter: Payer: Self-pay | Admitting: Family Medicine

## 2016-05-14 VITALS — BP 152/99 | HR 94 | Temp 98.2°F | Ht 64.0 in | Wt 258.0 lb

## 2016-05-14 DIAGNOSIS — I10 Essential (primary) hypertension: Secondary | ICD-10-CM | POA: Diagnosis not present

## 2016-05-14 DIAGNOSIS — E039 Hypothyroidism, unspecified: Secondary | ICD-10-CM | POA: Diagnosis not present

## 2016-05-14 DIAGNOSIS — I480 Paroxysmal atrial fibrillation: Secondary | ICD-10-CM

## 2016-05-14 LAB — TSH: TSH: 2.33 u[IU]/mL (ref 0.35–4.50)

## 2016-05-14 LAB — BASIC METABOLIC PANEL
BUN: 20 mg/dL (ref 6–23)
CALCIUM: 9.6 mg/dL (ref 8.4–10.5)
CO2: 29 mEq/L (ref 19–32)
CREATININE: 1.42 mg/dL (ref 0.40–1.50)
Chloride: 107 mEq/L (ref 96–112)
GFR: 52.51 mL/min — AB (ref >60.00)
Glucose, Bld: 88 mg/dL (ref 70–99)
Potassium: 4.2 mEq/L (ref 3.5–5.1)
Sodium: 143 mEq/L (ref 135–145)

## 2016-05-14 MED ORDER — AMIODARONE HCL 200 MG PO TABS: 200.0000 mg | ORAL_TABLET | Freq: Every day | ORAL | 1 refills | Status: DC

## 2016-05-14 NOTE — Progress Notes (Signed)
   Subjective:    Patient ID: Erik [name], male    DOB: 06/01/1945, 71 y.o.   MRN: [id number]  HPI Here for elevated BP readings over the past month. He feels fine. His BP at home has averaged 140s to 160s over 90s. No recent palpitations or other signs of fibrillation.    Review of Systems  Constitutional: Negative.   Respiratory: Negative.   Cardiovascular: Negative.   Neurological: Negative.        Objective:   Physical Exam  Constitutional: He is oriented to person, place, and time. He appears well-developed and well-nourished.  Cardiovascular: Normal rate, regular rhythm, normal heart sounds and intact distal pulses.   Pulmonary/Chest: Effort normal and breath sounds normal. No respiratory distress. He has no wheezes. He has no rales.  Musculoskeletal: He exhibits no edema.  Neurological: He is alert and oriented to person, place, and time.          Assessment & Plan:  For the HTN, we will increase the Losartan HCT to a full tablet daily instead of 1/2. Keep the other meds the same. Recheck in 2 weeks. Also get labs today to check his thyroid levels.  [name], MD

## 2016-05-14 NOTE — Progress Notes (Signed)
Pre visit review using our clinic review tool, if applicable. No additional management support is needed unless otherwise documented below in the visit note. 

## 2016-05-14 NOTE — Patient Instructions (Signed)
WE NOW OFFER   Erik [name]'s FAST TRACK!!!  SAME DAY Appointments for ACUTE CARE  Such as: Sprains, Injuries, cuts, abrasions, rashes, muscle pain, joint pain, back pain Colds, flu, sore throats, headache, allergies, cough, fever  Ear pain, sinus and eye infections Abdominal pain, nausea, vomiting, diarrhea, upset stomach Animal/insect bites  3 Easy Ways to Schedule: Walk-In Scheduling Call in scheduling Mychart Sign-up: [email or web address]         

## 2016-06-06 ENCOUNTER — Ambulatory Visit (INDEPENDENT_AMBULATORY_CARE_PROVIDER_SITE_OTHER): Payer: Medicare Other | Admitting: *Deleted

## 2016-06-06 DIAGNOSIS — I4891 Unspecified atrial fibrillation: Secondary | ICD-10-CM

## 2016-06-06 NOTE — Progress Notes (Signed)
Remote pacemaker transmission.   

## 2016-06-07 ENCOUNTER — Encounter: Payer: Self-pay | Admitting: Cardiology

## 2016-06-08 LAB — CUP PACEART REMOTE DEVICE CHECK
Battery Remaining Longevity: 102 mo
Brady Statistic AP VP Percent: 3.4 %
Brady Statistic AS VP Percent: 1 %
Brady Statistic RV Percent Paced: 4.4 %
Implantable Lead Location: 753859
Implantable Lead Location: 753860
Implantable Pulse Generator Implant Date: 20170501
Lead Channel Impedance Value: 430 Ohm
Lead Channel Pacing Threshold Amplitude: 1 V
Lead Channel Pacing Threshold Pulse Width: 0.5 ms
Lead Channel Sensing Intrinsic Amplitude: 12 mV
Lead Channel Setting Pacing Amplitude: 2 V
Lead Channel Setting Pacing Pulse Width: 0.5 ms
MDC IDC LEAD IMPLANT DT: 20170501
MDC IDC LEAD IMPLANT DT: 20170501
MDC IDC MSMT BATTERY REMAINING PERCENTAGE: 95.5 %
MDC IDC MSMT BATTERY VOLTAGE: 2.99 V
MDC IDC MSMT LEADCHNL RA PACING THRESHOLD AMPLITUDE: 1 V
MDC IDC MSMT LEADCHNL RA PACING THRESHOLD PULSEWIDTH: 0.5 ms
MDC IDC MSMT LEADCHNL RA SENSING INTR AMPL: 4 mV
MDC IDC MSMT LEADCHNL RV IMPEDANCE VALUE: 540 Ohm
MDC IDC SESS DTM: 20180411060014
MDC IDC SET LEADCHNL RV PACING AMPLITUDE: 2.5 V
MDC IDC SET LEADCHNL RV SENSING SENSITIVITY: 2 mV
MDC IDC STAT BRADY AP VS PERCENT: 94 %
MDC IDC STAT BRADY AS VS PERCENT: 2.2 %
MDC IDC STAT BRADY RA PERCENT PACED: 92 %
Pulse Gen Serial Number: 7897836

## 2016-06-27 ENCOUNTER — Telehealth: Payer: Self-pay | Admitting: Cardiology

## 2016-06-27 ENCOUNTER — Ambulatory Visit (INDEPENDENT_AMBULATORY_CARE_PROVIDER_SITE_OTHER): Payer: Medicare Other | Admitting: Cardiology

## 2016-06-27 ENCOUNTER — Encounter: Payer: Self-pay | Admitting: Cardiology

## 2016-06-27 VITALS — BP 132/84 | HR 60 | Ht 71.0 in | Wt 257.6 lb

## 2016-06-27 DIAGNOSIS — I480 Paroxysmal atrial fibrillation: Secondary | ICD-10-CM

## 2016-06-27 DIAGNOSIS — Z79899 Other long term (current) drug therapy: Secondary | ICD-10-CM | POA: Diagnosis not present

## 2016-06-27 DIAGNOSIS — I495 Sick sinus syndrome: Secondary | ICD-10-CM

## 2016-06-27 LAB — CUP PACEART INCLINIC DEVICE CHECK
Brady Statistic RV Percent Paced: 4.3 %
Date Time Interrogation Session: 20180502125826
Implantable Lead Implant Date: 20170501
Implantable Lead Location: 753859
Implantable Pulse Generator Implant Date: 20170501
Lead Channel Impedance Value: 437.5 Ohm
Lead Channel Pacing Threshold Amplitude: 0.75 V
Lead Channel Pacing Threshold Pulse Width: 0.5 ms
Lead Channel Pacing Threshold Pulse Width: 0.5 ms
Lead Channel Setting Pacing Amplitude: 2.125
Lead Channel Setting Pacing Amplitude: 2.5 V
Lead Channel Setting Pacing Pulse Width: 0.5 ms
Lead Channel Setting Sensing Sensitivity: 2 mV
MDC IDC LEAD IMPLANT DT: 20170501
MDC IDC LEAD LOCATION: 753860
MDC IDC MSMT BATTERY VOLTAGE: 2.99 V
MDC IDC MSMT LEADCHNL RA PACING THRESHOLD AMPLITUDE: 1 V
MDC IDC MSMT LEADCHNL RA PACING THRESHOLD PULSEWIDTH: 0.5 ms
MDC IDC MSMT LEADCHNL RA SENSING INTR AMPL: 2.6 mV
MDC IDC MSMT LEADCHNL RV IMPEDANCE VALUE: 525 Ohm
MDC IDC MSMT LEADCHNL RV PACING THRESHOLD AMPLITUDE: 0.75 V
MDC IDC MSMT LEADCHNL RV SENSING INTR AMPL: 12 mV
MDC IDC PG SERIAL: 7897836
MDC IDC STAT BRADY RA PERCENT PACED: 92 %
Pulse Gen Model: 2272

## 2016-06-27 MED ORDER — DILTIAZEM HCL ER COATED BEADS 120 MG PO CP24: 120.0000 mg | ORAL_CAPSULE | Freq: Every day | ORAL | 3 refills | Status: DC

## 2016-06-27 MED ORDER — DILTIAZEM HCL 30 MG PO TABS: ORAL_TABLET | ORAL | 1 refills | Status: DC

## 2016-06-27 MED ORDER — CARVEDILOL 12.5 MG PO TABS: 12.5000 mg | ORAL_TABLET | Freq: Two times a day (BID) | ORAL | 3 refills | Status: DC

## 2016-06-27 NOTE — Telephone Encounter (Signed)
New message    Pt wife is calling with questions about the after visit summary. Her question is about lab work and his medication.

## 2016-06-27 NOTE — Progress Notes (Signed)
Electrophysiology Office Note   Date:  06/27/2016   ID:  Erik [name], DOB 1945-03-23, MRN [id number]  PCP:  [name], MD  Cardiologist:  [name] Primary Electrophysiologist:  [name] [name], MD    Chief Complaint  Patient presents with  . Atrial Fibrillation     History of Present Illness: Erik [name] is a 71 y.o. male who presents today for electrophysiology evaluation.   He presents today for workup of his atrial fibrillation.  He has a history of nonobstructive coronary artery disease, hypertension, hyperlipidemia, hypothyroidism. In March 2016, he was diagnosed with atrial fibrillation with RVR in the setting of chest pain. He was loaded on tikosyn which was stopped due to QTc prolongation.  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    Today, he denies symptoms. He has no chest pain, shortness of breath, PND, orthopnea, or palpitations. Device interrogation shows no issues. He has been working to exercise. Unfortunately, he tried a CPAP and did not tolerate it. At this point, he does not wish to wear CPAP any longer.   Past Medical History:  Diagnosis Date  . BPH with urinary obstruction   . Carotid disease, bilateral ([place])    a. 09/2014 Carotid U/S: 1-39% bilat ICA stenosis.  . Chronic lower back pain   . CKD (chronic kidney disease), stage III   . Diverticulosis   . Gout   . Hyperlipidemia   . Hypertensive heart disease   . Hypothyroidism   . Internal hemorrhoids   . Nephrolithiasis   . Non-obstructive CAD    a. 07/2002 Cath: LM 20, LAD 47m/d, LCX 50-81m, OM1 90m, RCA 17m, EF 60%; b. 05/2014 MV: low risk w/ small sized, mild intensity rev defect in apical/inferior/infsept area, nl EF->Med Rx.  . Paroxysmal atrial fibrillation ([place])    a. Dx 04/2014; b. 05/2014 Echo: Ef 60-65%, no rwma, triv MR/TR, nl RV;  c. CHA2DS2VASc = 3-->was on eliquis, switched to xarelto 04/2015 2/2 cost; d. 05/2015 Tikosyn loaded w/ conversion to AF; e.  06/2015 QTc prolongation and bradycardia->tikosyn d/c'd, PPM placed, Amio started; f. 07/01/2015 In Aflutter @ clinic f/u.  [name] Paroxysmal atrial flutter ([place])    a. 06/2015 noted to be in rapid Aflutter in Afib clinic-->amio load continued.  . Tachy-brady syndrome ([place])    a. 06/27/2015 s/p SJM DC PPM (ser # @ [id number]).   Past Surgical History:  Procedure Laterality Date  . COLONOSCOPY  06/27/2015   per Dr. Henrene Pastor, internal hemorrhoids and diverticulae, repeat 10 yrs  . EP IMPLANTABLE DEVICE N/A 06/27/2015   Procedure: Pacemaker Implant;  Surgeon: Evans Lance, MD;  Location: [facility];  Service: Cardiovascular;  Laterality: N/A;  . HAND SURGERY Right   . LUMBAR DISC SURGERY  1998   2 lumbar discs, Dr. Ellene Route      Current Outpatient Prescriptions  Medication Sig Dispense Refill  . acetaminophen (TYLENOL) 500 MG tablet Take 1,000 mg by mouth every 6 (six) hours as needed. Reported on 07/06/2015    . amiodarone (PACERONE) 200 MG tablet Take 1 tablet (200 mg total) by mouth daily. 180 tablet 1  . diltiazem (CARDIZEM CD) 120 MG 24 hr capsule TAKE 1 CAPSULE (120 MG TOTAL) BY MOUTH DAILY. 30 capsule 5  . diltiazem (CARDIZEM) 30 MG tablet Cardizem 30mg  -- take 1 tablet every 4 hours AS NEEDED for heart rate >100 as long as blood pressure >100. 45 tablet 1  . ibuprofen (ADVIL,MOTRIN) 200  MG tablet Take 200 mg by mouth every 8 (eight) hours as needed.    [name] levothyroxine (SYNTHROID, LEVOTHROID) 50 MCG tablet Take 1 tablet (50 mcg total) by mouth daily. 90 tablet 3  . losartan-hydrochlorothiazide (HYZAAR) 100-25 MG tablet Take 0.5 tablets by mouth daily. (Patient taking differently: Take 1 tablet by mouth daily. ) 30 tablet 6  . metoprolol tartrate (LOPRESSOR) 25 MG tablet Take 1 tablet (25 mg total) by mouth 2 (two) times daily. 60 tablet 4  . Multiple Vitamins-Minerals (MULTIVITAMIN WITH MINERALS) tablet Take 1 tablet by mouth daily.    . rivaroxaban (XARELTO) 20 MG TABS tablet Take 1 tablet (20  mg total) by mouth daily with supper. 90 tablet 3  . simvastatin (ZOCOR) 20 MG tablet Take 0.5 tablets (10 mg total) by mouth daily. 90 tablet 3   No current facility-administered medications for this visit.     Allergies:   Patient has no known allergies.   Social History:  The patient  reports that he quit smoking about 21 years ago. His smoking use included Cigarettes. He has never used smokeless tobacco. He reports that he drinks alcohol. He reports that he does not use drugs.   Family History:  The patient's family history includes Cancer in his paternal aunt; Dementia in his mother; Diabetes in his paternal grandmother; Stroke in his father and paternal aunt.    ROS:  Please see the history of present illness.   Otherwise, review of systems is positive for none.   All other systems are reviewed and negative.     PHYSICAL EXAM: VS:  BP 132/84   Pulse 60   Ht 5\' 4"  (1.626 m)   Wt 257 lb 9.6 oz (116.8 kg)   BMI 44.22 kg/m  , BMI Body mass index is 44.22 kg/m. GEN: Well nourished, well developed, in no acute distress  HEENT: normal  Neck: no JVD, carotid bruits, or masses Cardiac: RRR; no murmurs, rubs, or gallops,no edema  Respiratory:  clear to auscultation bilaterally, normal work of breathing GI: soft, nontender, nondistended, + BS MS: no deformity or atrophy  Skin: warm and dry, device site well healed Neuro:  Strength and sensation are intact Psych: euthymic mood, full affect   EKG:  EKG is ordered today. Personal review of the ekg ordered shows A paced, rate 60   Recent Labs: 09/16/2015: ALT 50; Hemoglobin 15.4; Platelets 247 05/14/2016: BUN 20; Creatinine, Ser 1.42; Potassium 4.2; Sodium 143; TSH 2.33    Lipid Panel     Component Value Date/Time   CHOL 133 09/29/2015 0916   TRIG 95.0 09/29/2015 0916   TRIG 203 (HH) 12/10/2005 0839   HDL 51.30 09/29/2015 0916   CHOLHDL 3 09/29/2015 0916   VLDL 19.0 09/29/2015 0916   LDLCALC 63 09/29/2015 0916   LDLDIRECT  104.9 06/09/2009 0919     Wt Readings from Last 3 Encounters:  06/27/16 257 lb 9.6 oz (116.8 kg)  05/14/16 258 lb (117 kg)  12/21/15 245 lb (111.1 kg)      Other studies Reviewed: Additional studies/ records that were reviewed today include: TTE 11/10/15 Review of the above records today demonstrates:  - Left ventricle: The cavity size was normal. Wall thickness was   increased in a pattern of mild LVH. The estimated ejection   fraction was 55%. Wall motion was normal; there were no regional   wall motion abnormalities. Doppler parameters are consistent with   abnormal left ventricular relaxation (grade 1 diastolic   dysfunction). -  Aortic valve: There was no stenosis. - Mitral valve: There was trivial regurgitation. - Left atrium: The atrium was mildly dilated. - Right ventricle: The cavity size was normal. Pacer wire or   catheter noted in right ventricle. Systolic function was normal. - Pulmonary arteries: No complete TR doppler jet so unable to   estimate PA systolic pressure. - Inferior vena cava: The vessel was normal in size. The   respirophasic diameter changes were in the normal range (= 50%),   consistent with normal central venous pressure. - Pericardium, extracardiac: A trivial pericardial effusion was   identified posterior to the heart.  Myoview 06/04/14 Low risk stress nuclear study with a small sized, mild intensity reversible defect concering for mild apical-inferior/inferoseptal ischeima.  Clinical Correlation is recommended.  ASSESSMENT AND PLAN:  1.  Paroxysmal atrial fibrillation: Currently on Xarelto. Was started on Tikosyn, but did not tolerate due to QT prolongation. Has been on amiodarone. Would prefer to stop his amiodarone due to long-term side effects. Chesley Veasey stop amiodarone today and see him back for discussion of flecainide in the future.  This patients CHA2DS2-VASc Score and unadjusted Ischemic Stroke Rate (% per year) is equal to 2.2 % stroke  rate/year from a score of 2  Above score calculated as 1 point each if present [CHF, HTN, DM, Vascular=MI/PAD/Aortic Plaque, Age if 65-74, or Male] Above score calculated as 2 points each if present [Age > 75, or Stroke/TIA/TE]  2. Hypertension: Well-controlled  3.  Sinus node dysfunction:  Pacemaker functioning properly. No changes necessary today.  4. Shortness of breath: Prior cardiopulmonary testing that showed a possible restrictive pattern. Early followed by pulmonary.   Current medicines are reviewed at length with the patient today.   The patient does not have concerns regarding his medicines.  The following changes were made today:  Stop amiodarone, stop metoprolol, start carvedilol  Labs/ tests ordered today include:  No orders of the defined types were placed in this encounter.    Disposition:   FU with Taiyana Kissler 3 months  Signed, Mel Langan [name], MD  06/27/2016 11:52 AM     [facility] [address] [place] [address] [phone] (office) [phone] (faHe was referred by by his primaryx)

## 2016-06-27 NOTE — Telephone Encounter (Signed)
Wife asking about refilling Hyzaar -- advised to contact Dr. Sarajane Jews, ordering provider. Also asked that his height be corrected -- states 5'4" but he is 5'11".  Informed wife that I would correct the encounter for today. She thanks me for helping and calling

## 2016-06-27 NOTE — Patient Instructions (Addendum)
Medication Instructions:   Your physician has recommended you make the following change in your medication:  1) STOP Amiodarone 2) STOP Lopressor 3) START Carvedilol 12.5 mg twice daily  --- If you need a refill on your cardiac medications before your next appointment, please call your pharmacy. ---  Labwork:  Your physician recommends that you return for lab work in: 3 months, prior to seeing Dr. Curt Bears for Amiodarone level.  Testing/Procedures:  None ordered  Follow-Up:  Your physician recommends that you schedule a follow-up appointment in: 3 months with Dr. Curt Bears.  Thank you for choosing [facility]!!   Trinidad Curet, RN [phone]

## 2016-06-28 ENCOUNTER — Telehealth: Payer: Self-pay | Admitting: Family Medicine

## 2016-06-28 DIAGNOSIS — I480 Paroxysmal atrial fibrillation: Secondary | ICD-10-CM

## 2016-06-28 NOTE — Telephone Encounter (Signed)
I see Dr. Curt Bears stopped Amiodarone, and he switched from Metoprolol to Carvedilol. These were more about his rate control than about BP. His BP is well controlled on a whole tablet of Losartan HCT every day, so stay on this dose. Call in #[phone] with 3 rf

## 2016-06-28 NOTE — Telephone Encounter (Signed)
Pt went to see Dr. Shonna Chock on yesterday and wanted pt to see if Dr. Sarajane Jews wanted him to continue to take a full tablet of losartan.  Dr. Shonna Chock discontinued two other medication.  Pts wife would like to just discuss this in full detail pt give her a call back on her cell phone.

## 2016-06-28 NOTE — Telephone Encounter (Signed)
Spoke with pt's wife and relayed information. She voiced understanding. Nothing further needed at this time.

## 2016-06-28 NOTE — Telephone Encounter (Signed)
Please advise 

## 2016-06-29 MED ORDER — LOSARTAN POTASSIUM-HCTZ 100-25 MG PO TABS: 1.0000 | ORAL_TABLET | Freq: Every day | ORAL | 5 refills | Status: DC

## 2016-06-29 NOTE — Telephone Encounter (Signed)
Spoke with pt's wife and advised. Rx sent to pharmacy. She will pick up today and have pt start. Nothing further needed at this time.

## 2016-06-29 NOTE — Telephone Encounter (Signed)
He should be taking the Losartan HCT

## 2016-06-29 NOTE — Telephone Encounter (Signed)
Pt's wife called back regarding his medications. She states that the Losartan he has at home is Losartan-Potassium and not Office manager. They are confused as to which one he should be taking. Per wife Dr. Curt Bears advised pt to ask you regarding the Losartan. Per wife pt has been taking 1tab Losartan-Potassium daily with no improvement in BP.   Dr. Sarajane Jews - Please advise. Thanks!

## 2016-07-19 ENCOUNTER — Other Ambulatory Visit: Payer: Self-pay | Admitting: Cardiology

## 2016-07-19 NOTE — Telephone Encounter (Signed)
Pt saw Dr Shonna Chock on 06/27/16. Wt was 116.8Kg. SCr on 05/14/16 was 1.42. Creatine clearance is 81.7mL/min. Xarelto 20mg  reordered.

## 2016-08-10 ENCOUNTER — Telehealth: Payer: Self-pay | Admitting: Internal Medicine

## 2016-08-10 MED ORDER — HYDROCORTISONE ACETATE 25 MG RE SUPP: 25.0000 mg | Freq: Every evening | RECTAL | 3 refills | Status: DC | PRN

## 2016-08-10 NOTE — Telephone Encounter (Signed)
Refilled Anusol suppositories 

## 2016-09-12 ENCOUNTER — Other Ambulatory Visit: Payer: Self-pay | Admitting: Family Medicine

## 2016-09-19 ENCOUNTER — Other Ambulatory Visit (HOSPITAL_COMMUNITY): Payer: Self-pay | Admitting: Nurse Practitioner

## 2016-09-21 ENCOUNTER — Other Ambulatory Visit: Payer: Self-pay

## 2016-09-25 ENCOUNTER — Telehealth: Payer: Self-pay | Admitting: Cardiology

## 2016-09-25 ENCOUNTER — Ambulatory Visit (INDEPENDENT_AMBULATORY_CARE_PROVIDER_SITE_OTHER): Payer: Medicare Other | Admitting: *Deleted

## 2016-09-25 DIAGNOSIS — I495 Sick sinus syndrome: Secondary | ICD-10-CM

## 2016-09-25 NOTE — Progress Notes (Signed)
Remote pacemaker transmission.   

## 2016-09-25 NOTE — Telephone Encounter (Signed)
Spoke with pt and reminded pt of remote transmission that is due today. Pt verbalized understanding.   

## 2016-09-26 ENCOUNTER — Other Ambulatory Visit: Payer: Medicare Other

## 2016-09-27 ENCOUNTER — Encounter: Payer: Self-pay | Admitting: Physician Assistant

## 2016-09-27 ENCOUNTER — Other Ambulatory Visit (INDEPENDENT_AMBULATORY_CARE_PROVIDER_SITE_OTHER): Payer: Medicare Other

## 2016-09-27 ENCOUNTER — Ambulatory Visit (INDEPENDENT_AMBULATORY_CARE_PROVIDER_SITE_OTHER): Payer: Medicare Other | Admitting: Physician Assistant

## 2016-09-27 ENCOUNTER — Telehealth: Payer: Self-pay

## 2016-09-27 ENCOUNTER — Encounter: Payer: Self-pay | Admitting: Cardiology

## 2016-09-27 VITALS — BP 104/64 | HR 64 | Ht 70.5 in | Wt 249.0 lb

## 2016-09-27 DIAGNOSIS — Z7901 Long term (current) use of anticoagulants: Secondary | ICD-10-CM

## 2016-09-27 DIAGNOSIS — K648 Other hemorrhoids: Secondary | ICD-10-CM

## 2016-09-27 DIAGNOSIS — K625 Hemorrhage of anus and rectum: Secondary | ICD-10-CM

## 2016-09-27 LAB — CBC WITH DIFFERENTIAL/PLATELET
BASOS PCT: 0.9 % (ref 0.0–3.0)
Basophils Absolute: 0.1 10*3/uL (ref 0.0–0.1)
EOS PCT: 3.5 % (ref 0.0–5.0)
Eosinophils Absolute: 0.2 10*3/uL (ref 0.0–0.7)
HCT: 36.1 % — ABNORMAL LOW (ref 39.0–52.0)
HEMOGLOBIN: 11.6 g/dL — AB (ref 13.0–17.0)
Lymphocytes Relative: 18.5 % (ref 12.0–46.0)
Lymphs Abs: 1.2 10*3/uL (ref 0.7–4.0)
MCHC: 32.2 g/dL (ref 30.0–36.0)
MCV: 79.2 fl (ref 78.0–100.0)
MONOS PCT: 11 % (ref 3.0–12.0)
Monocytes Absolute: 0.7 10*3/uL (ref 0.1–1.0)
Neutro Abs: 4.2 10*3/uL (ref 1.4–7.7)
Neutrophils Relative %: 66.1 % (ref 43.0–77.0)
Platelets: 235 10*3/uL (ref 150.0–400.0)
RBC: 4.56 Mil/uL (ref 4.22–5.81)
RDW: 15.5 % (ref 11.5–15.5)
WBC: 6.4 10*3/uL (ref 4.0–10.5)

## 2016-09-27 MED ORDER — HYDROCORTISONE ACETATE 25 MG RE SUPP: 25.0000 mg | Freq: Every evening | RECTAL | 6 refills | Status: DC | PRN

## 2016-09-27 NOTE — Telephone Encounter (Signed)
Should be fine to come off anticoagulation for a short time for endoscopic procedure. Would restart when clinically safe. Thank you.  Allegra Lai, MD

## 2016-09-27 NOTE — Telephone Encounter (Signed)
   Erik [name] 09/25/1945 [id number]  Dear Dr. Shonna Chock:  We have scheduled the above named patient for a(n) Hemorroidal banding procedure. Our records show that he is on anticoagulation therapy.  Please advise as to whether the patient may come off their therapy of Xarelto for 24 hours prior to their procedure which is scheduled for 10-18-16.  Please route your response to Lemar Lofty or fax response to [phone].  Sincerely,    [facility]

## 2016-09-27 NOTE — Patient Instructions (Addendum)
We have sent the following medications to your pharmacy for you to pick up at your convenience: Anusol suppositories  Your physician has requested that you go to the basement for the following lab work before leaving today: CBC  We have scheduled you for a Hemorrhoid banding.  If you are age 69 or older, your body mass index should be between 23-30. Your Body mass index is 35.22 kg/m. If this is out of the aforementioned range listed, please consider follow up with your Primary Care Provider.  If you are age 67 or younger, your body mass index should be between 19-25. Your Body mass index is 35.22 kg/m. If this is out of the aformentioned range listed, please consider follow up with your Primary Care Provider.

## 2016-09-27 NOTE — Progress Notes (Signed)
Reviewed assessment and plans for referral to colleague regarding hemorrhoidal banding in this patient on chronic anticoagulation

## 2016-09-27 NOTE — Progress Notes (Addendum)
Subjective:    Patient ID: Erik [name], male    DOB: 1945/09/19, 71 y.o.   MRN: [id number]  HPI Erik [name] is a pleasant 71 year old white male, known to Erik [name] from previous colonoscopy. He comes in today with complaints of rectal bleeding Patient has history of hypertension, atrial fibrillation, sick sinus syndrome ,for which she is status post pacemaker placement, coronary artery disease, and stage III chronic kidney disease. He is maintained on Xarelto and followed by Erik [name] for cardiology. Patient had undergone colonoscopy in May 2016 with finding of moderate diverticulosis and internal hemorrhoids. There were no polyps and he was advised to have 10 year interval follow-up. Patient says that he has had intermittent rectal bleeding with bright red blood with bowel movements over the past 5-6 years. He says he may go for several weeks having bleeding on a daily basis and then symptoms resolve for a  month or so and then return.Over the past couple of months he has had an increase in bleeding. He says every time he has a bowel movement he will have bleeding with a bowel movement and then have oozing of blood for about 24 hours after the bowel movement. He says this morning he was standing in the shower and noticed that he was developing some bright red blood from his rectum. He has no complaints of changes in his bowel habits no rectal pain or discomfort no abdominal pain. He has been using Anusol suppositories over the past couple of weeks and does not feel that this made any difference. He is tired of bleeding.  Review of Systems Pertinent positive and negative review of systems were noted in the above HPI section.  All other review of systems was otherwise negative.  Outpatient Encounter Prescriptions as of 09/27/2016  Medication Sig  . acetaminophen (TYLENOL) 500 MG tablet Take 1,000 mg by mouth every 6 (six) hours as needed. Reported on 07/06/2015  . carvedilol (COREG) 12.5 MG tablet  Take 1 tablet (12.5 mg total) by mouth 2 (two) times daily.  [name] diltiazem (CARDIZEM CD) 120 MG 24 hr capsule Take 1 capsule (120 mg total) by mouth daily.  [name] diltiazem (CARDIZEM) 30 MG tablet Cardizem 30mg  -- take 1 tablet every 4 hours AS NEEDED for heart rate >100 as long as blood pressure >100.  . hydrocortisone (ANUSOL-HC) 25 MG suppository Place 1 suppository (25 mg total) rectally at bedtime as needed.  [name] ibuprofen (ADVIL,MOTRIN) 200 MG tablet Take 200 mg by mouth every 8 (eight) hours as needed.  [name] levothyroxine (SYNTHROID, LEVOTHROID) 50 MCG tablet TAKE 1 TABLET (50 MCG TOTAL) BY MOUTH DAILY.  [name] losartan-hydrochlorothiazide (HYZAAR) 100-25 MG tablet Take 1 tablet by mouth daily.  . Multiple Vitamins-Minerals (MULTIVITAMIN WITH MINERALS) tablet Take 1 tablet by mouth daily.  . simvastatin (ZOCOR) 20 MG tablet Take 0.5 tablets (10 mg total) by mouth daily.  Erik [name] 20 MG TABS tablet TAKE 1 TABLET (20 MG TOTAL) BY MOUTH DAILY WITH SUPPER.  . [DISCONTINUED] hydrocortisone (ANUSOL-HC) 25 MG suppository Place 1 suppository (25 mg total) rectally at bedtime as needed for hemorrhoids or itching.   No facility-administered encounter medications on file as of 09/27/2016.    No Known Allergies Patient Active Problem List   Diagnosis Date Noted  . Internal hemorrhoids 09/27/2016  . Constipation 09/29/2015  . CKD (chronic kidney disease), stage III   . Bradycardia   . Rapid palpitations 06/26/2015  . Dizziness 06/26/2015  . Long QT interval 06/26/2015  .  Visit for monitoring Tikosyn therapy 06/26/2015  . Atypical chest pain 06/26/2015  . Persistent atrial fibrillation ([place]) 06/21/2015  . Hypertensive heart disease   . Hypothyroidism   . Non-obstructive CAD   . Carotid disease, bilateral ([place])   . Paroxysmal atrial fibrillation ([place]) 05/19/2014  . RLL pneumonia ([place]) 11/05/2012  . OBESITY 09/20/2008  . Hyperlipidemia 12/17/2005  . Essential hypertension 12/17/2005   Social History    Social History  . Marital status: Married    Spouse name: N/A  . Number of children: 1  . Years of education: N/A   Occupational History  . retired    Social History Main Topics  . Smoking status: Former Smoker    Types: Cigarettes    Quit date: 02/27/1995  . Smokeless tobacco: Never Used  . Alcohol use 0.0 oz/week     Comment: couple times a month  . Drug use: No  . Sexual activity: Not on file   Other Topics Concern  . Not on file   Social History Narrative  . No narrative on file    Erik [name]'s family history includes Cancer in his paternal aunt; Dementia in his mother; Diabetes in his paternal grandmother; Stroke in his father and paternal aunt.      Objective:    Vitals:   09/27/16 0900  BP: 104/64  Pulse: 64    Physical Exam  well-developed older white male in no acute distress, standing in the room. Accompanied by his wife blood pressure 104/64 pulse 64, height 5 foot 10 weight 249, BMI of 35.2. HEENT; nontraumatic normocephalic EOMI PERRLA sclerae anicteric, Cardiovascular; regular rate and rhythm with S1-S2, Pacemaker left chest wall, Pulmonary; clear bilaterally, Abdomen; soft, nontender nondistended bowel sounds are active there is no palpable mass or hepatosplenomegaly, Rectal; exam not done patient declined, Extremities; no clubbing cyanosis or edema skin warm and dry, Neuropsych ;mood and affect appropriate       Assessment & Plan:   #71 year old white male with intermittent rectal bleeding 5-6 years with increase in bleeding over the past couple of months. He has previously documented internal hemorrhoids on colonoscopy May 2016. At that time colonoscopy otherwise negative with exception of mild diverticulosis. His bleeding is certainly being aggravated by chronic Xarelto  #2 atrial fibrillation #36 sinus syndrome status post pacemaker Number for chronic kidney disease stage III #5 coronary artery disease #6 hypertension #7 obesity  Plan;  Long discussion with patient regarding management of internal hemorrhoidal bleeding. We'll check CBC today Continue Anusol HC suppositories at bedtime. Patient will be scheduled for in office hemorrhoidal banding. Procedure was discussed in detail with patient and his wife and they were also given educational materials. Patient will be scheduled with Dr. Carlean Purl. He will need to hold Xarelto for 24 hours prior to hemorrhoidal banding. We will communicate with his cardiologist Dr. Marcene Brawn to assure that this is reasonable for this patient. He was also advised that he may need to stay off of the Xarelto for a few days post banding, and that there is  some risk of increased bleeding/hemorrhage   after the  banding procedure.  Malary Aylesworth Genia Harold PA-C 09/27/2016   Cc: [name], MD  Appreciate the referral.  Seems like a good candidate for banding.  Gatha Mayer, MD, [facility]

## 2016-09-29 IMAGING — MR MR [PERSON_NAME]
5 of 8 series · 21 of 40 positions shown · non-contrast
Comparison: None.

CLINICAL DATA: Bilateral temporomandibular joint pain. Popping.
Locking.

EXAM:
MRI OF TEMPOROMANDIBULAR JOINT WITHOUT CONTRAST
TECHNIQUE: Multiplanar, multisequence MR imaging of the temporomandibular joint
was performed following the standard protocol. No intravenous
contrast was administered.

[Series 4: T1 · coronal · 3.0mm · 0.62mm/px · 3 of 16 slices shown (1 of 2)]
[im 1/16]
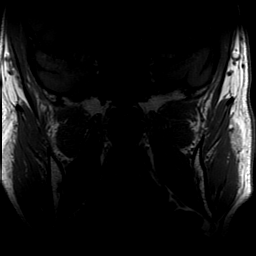
[im 8/16]
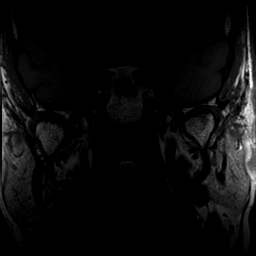
[im 16/16]
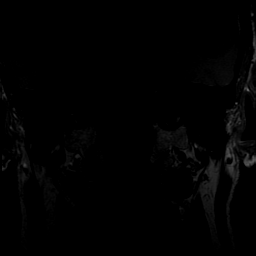

[Series 5: T1 · sagittal · 3.0mm · 0.27mm/px · 4 of 20 slices shown (2 of 2)]
[im 1/20]
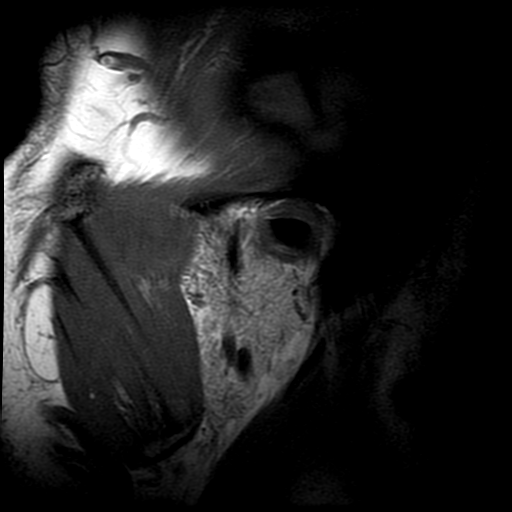
[im 7/20]
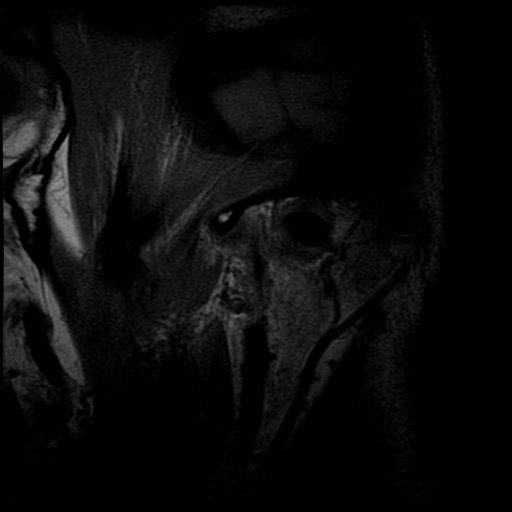
[im 13/20]
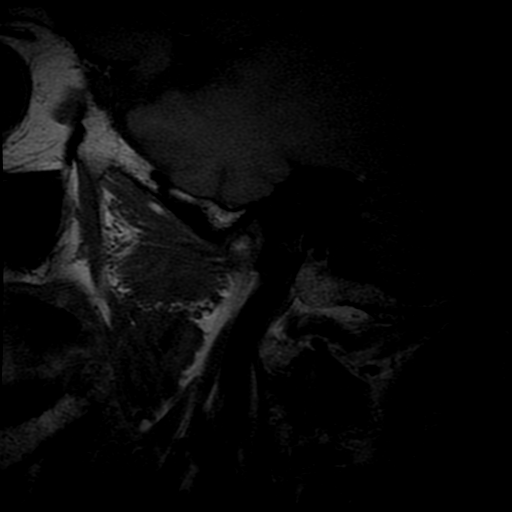
[im 20/20]
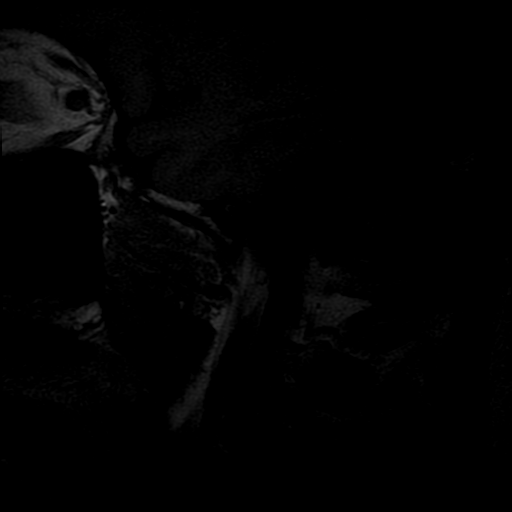

[Series 6: T2-star · sagittal · 3.0mm · 0.27mm/px · 4 of 20 slices shown (1 of 3)]
[im 1/20]
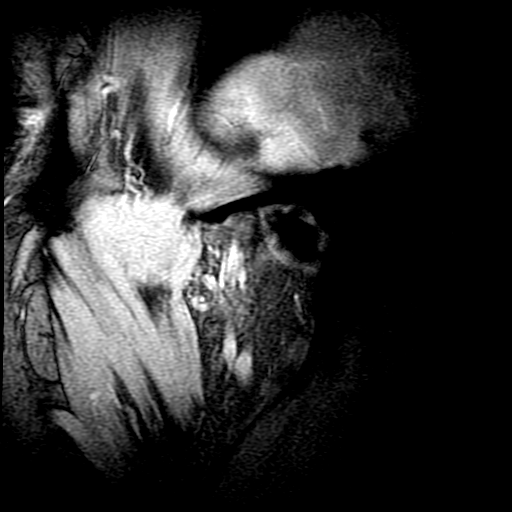
[im 7/20]
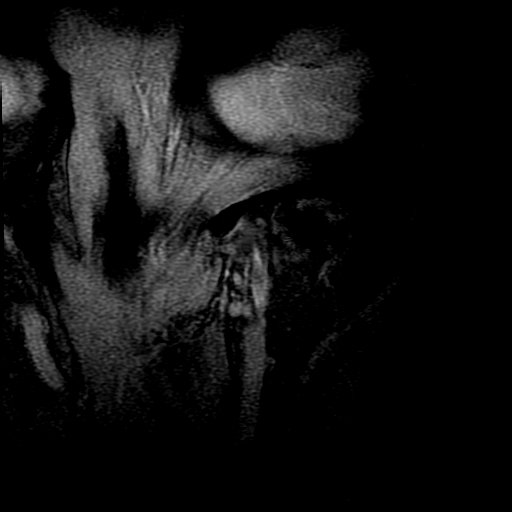
[im 13/20]
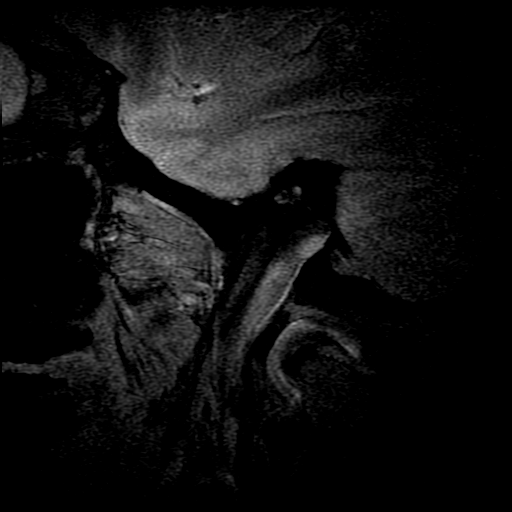
[im 20/20]
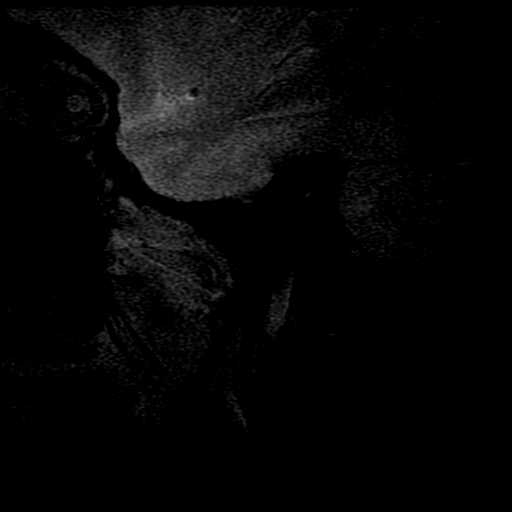

[Series 7: T2-star · sagittal · 3.0mm · 0.27mm/px · 5 of 20 slices shown (2 of 3)]
[im 1/20]
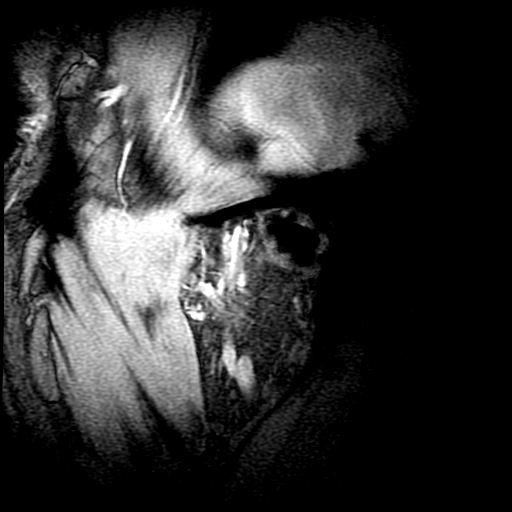
[im 5/20]
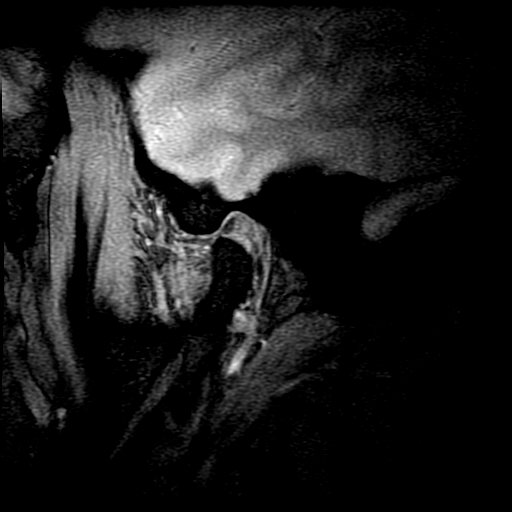
[im 10/20]
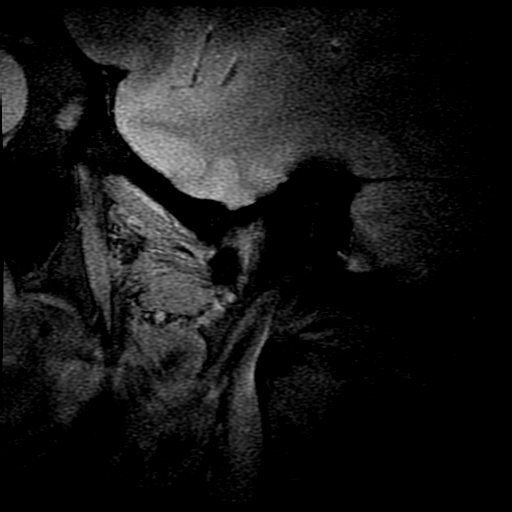
[im 15/20]
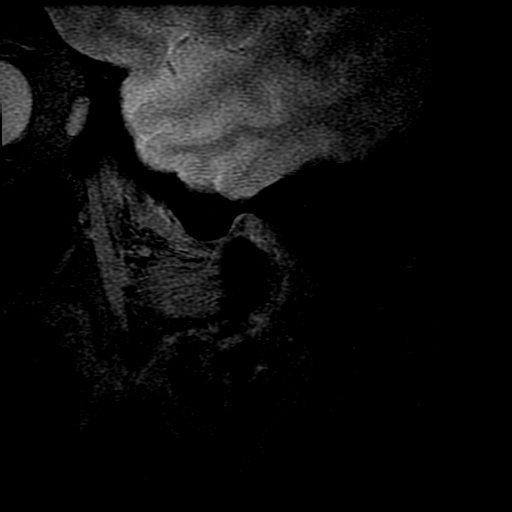
[im 20/20]
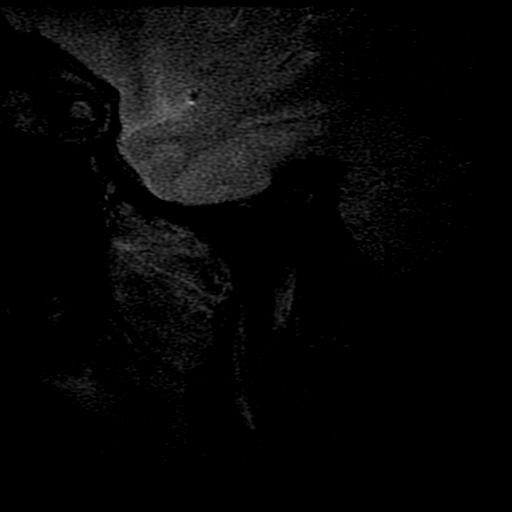

[Series 8: T2-star · sagittal · 3.0mm · 0.27mm/px · 5 of 40 slices shown (3 of 3)]
[im 1/40]
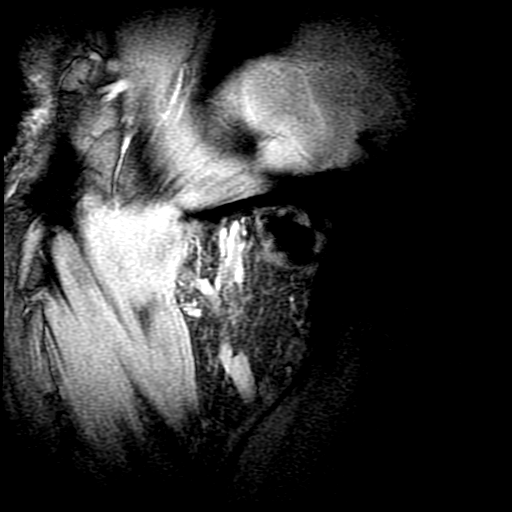
[im 5/40]
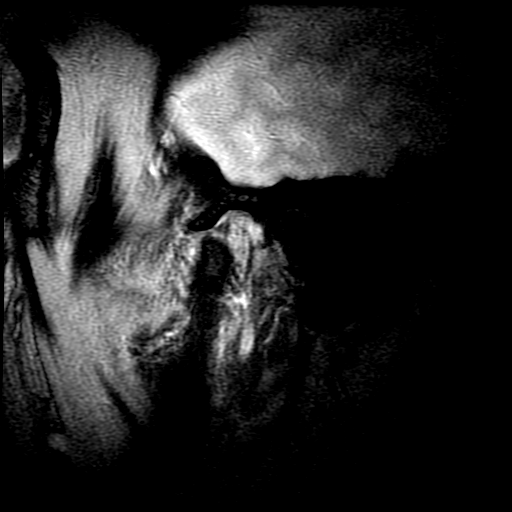
[im 10/40]
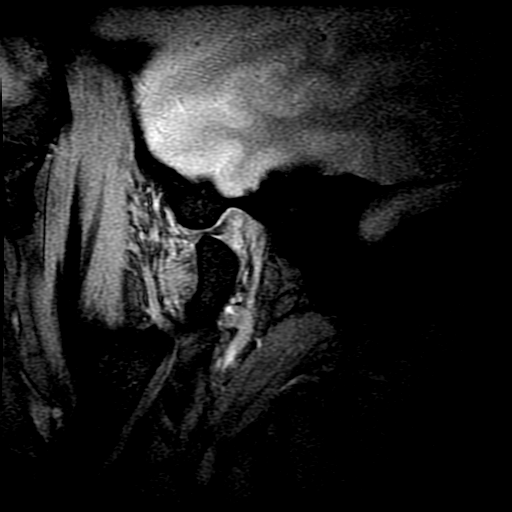
[im 15/40]
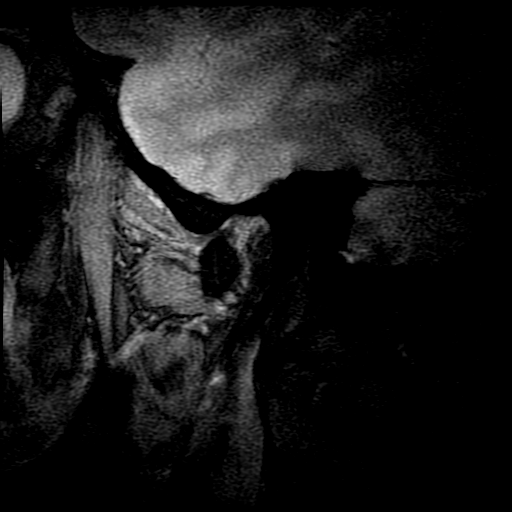
[im 25/40]
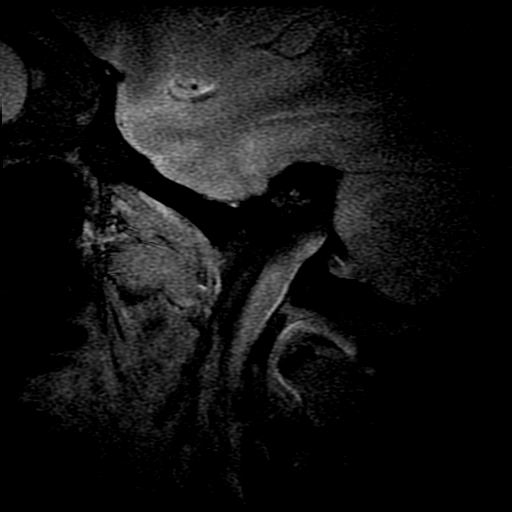

[21 of 40 positions shown; findings below may reference images not displayed]

FINDINGS: There is anterior dislocation of the discs bilaterally in the closed
position. There is immediate recapture of the discs at 5 mm of
opening bilaterally. The patient is then able to demonstrate normal
translation and rotation to the apex of the eminence on the right
and beyond the apex of the eminence on the left at 25 mm.

There is no visible perforation or joint effusion. There is no joint
space narrowing or erosion of the mandibular condyles or of the
articular eminences.
IMPRESSION: Anterior dislocation of the discs of the right and left
temporomandibular joints with early reduction. No limitation of
translation or rotation. No appreciable perforation.

## 2016-10-02 NOTE — Telephone Encounter (Signed)
Called patient and spoke to wife. Patient was out of town.  Instructed wife that per Dr. Curt Bears, patient needs to hold Xarelto on August 22 and August 23 for Hemorrohoid Banding with Carlean Purl on August 23rd.  Wife expressed understanding and will communicate with patient.  Tia Alert, CMA

## 2016-10-03 ENCOUNTER — Encounter: Payer: Medicare Other | Admitting: Cardiology

## 2016-10-10 ENCOUNTER — Other Ambulatory Visit: Payer: Medicare Other | Admitting: *Deleted

## 2016-10-10 DIAGNOSIS — Z79899 Other long term (current) drug therapy: Secondary | ICD-10-CM

## 2016-10-11 LAB — SPECIMEN STATUS

## 2016-10-17 ENCOUNTER — Encounter: Payer: Self-pay | Admitting: Cardiology

## 2016-10-17 ENCOUNTER — Ambulatory Visit (INDEPENDENT_AMBULATORY_CARE_PROVIDER_SITE_OTHER): Payer: Medicare Other | Admitting: Cardiology

## 2016-10-17 VITALS — BP 132/84 | HR 64 | Ht 71.5 in | Wt 254.4 lb

## 2016-10-17 DIAGNOSIS — I495 Sick sinus syndrome: Secondary | ICD-10-CM

## 2016-10-17 DIAGNOSIS — I10 Essential (primary) hypertension: Secondary | ICD-10-CM

## 2016-10-17 DIAGNOSIS — I480 Paroxysmal atrial fibrillation: Secondary | ICD-10-CM | POA: Diagnosis not present

## 2016-10-17 MED ORDER — DILTIAZEM HCL ER COATED BEADS 300 MG PO CP24: 300.0000 mg | ORAL_CAPSULE | Freq: Every day | ORAL | 2 refills | Status: DC

## 2016-10-17 NOTE — Progress Notes (Signed)
Electrophysiology Office Note   Date:  10/17/2016   ID:  Erik, [name] [birth year], MRN [id number]  PCP:  [name], MD  Cardiologist:  [name] Primary Electrophysiologist:  Constance Haw, MD    Chief Complaint  Patient presents with  . Pacemaker Check    PAF/Sinus node dysfunction     History of Present Illness: Erik [name] is a 71 y.o. male who presents today for electrophysiology evaluation.   He presents today for workup of his atrial fibrillation.  He has a history of nonobstructive coronary artery disease, hypertension, hyperlipidemia, hypothyroidism. In March 2016, he was diagnosed with atrial fibrillation with RVR in the setting of chest pain. He was loaded on tikosyn which was stopped due to QTc prolongation.  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    Today, denies symptoms of palpitations, chest pain, shortness of breath, orthopnea, PND, lower extremity edema, claudication, dizziness, presyncope, syncope, or neurologic sequela. The patient is tolerating medications without difficulties and is otherwise without complaint today.  He is having significant episodes of fatigue. He says that he can sleep most days as well as at night. He feels that this might be due to the weight that he has gained, but he also feels that it might be medication related. He is having bleeding from his hemorrhoids. He is planned to have banding procedure done tomorrow.   Past Medical History:  Diagnosis Date  . BPH with urinary obstruction   . Carotid disease, bilateral ([place])    a. 09/2014 Carotid U/S: 1-39% bilat ICA stenosis.  . Chronic lower back pain   . CKD (chronic kidney disease), stage III   . Diverticulosis   . Gout   . Hyperlipidemia   . Hypertensive heart disease   . Hypothyroidism   . Internal hemorrhoids   . Nephrolithiasis   . Non-obstructive CAD    a. 07/2002 Cath: LM 20, LAD 52m/d, LCX 50-76m, OM1 67m, RCA 37m, EF 60%; b.  05/2014 MV: low risk w/ small sized, mild intensity rev defect in apical/inferior/infsept area, nl EF->Med Rx.  . Paroxysmal atrial fibrillation ([place])    a. Dx 04/2014; b. 05/2014 Echo: Ef 60-65%, no rwma, triv MR/TR, nl RV;  c. CHA2DS2VASc = 3-->was on eliquis, switched to xarelto 04/2015 2/2 cost; d. 05/2015 Tikosyn loaded w/ conversion to AF; e. 06/2015 QTc prolongation and bradycardia->tikosyn d/c'd, PPM placed, Amio started; f. 07/01/2015 In Aflutter @ clinic f/u.  [name] Paroxysmal atrial flutter ([place])    a. 06/2015 noted to be in rapid Aflutter in Afib clinic-->amio load continued.  . Tachy-brady syndrome ([place])    a. 06/27/2015 s/p SJM DC PPM (ser # @ [id number]).   Past Surgical History:  Procedure Laterality Date  . COLONOSCOPY  06/27/2015   per Dr. Henrene Pastor, internal hemorrhoids and diverticulae, repeat 10 yrs  . EP IMPLANTABLE DEVICE N/A 06/27/2015   Procedure: Pacemaker Implant;  Surgeon: Evans Lance, MD;  Location: [facility];  Service: Cardiovascular;  Laterality: N/A;  . HAND SURGERY Right   . LUMBAR DISC SURGERY  1998   2 lumbar discs, Dr. Ellene Route      Current Outpatient Prescriptions  Medication Sig Dispense Refill  . acetaminophen (TYLENOL) 500 MG tablet Take 1,000 mg by mouth every 6 (six) hours as needed. Reported on 07/06/2015    . diltiazem (CARDIZEM) 30 MG tablet Cardizem 30mg  -- take 1 tablet every 4 hours AS NEEDED for heart rate >100 as  long as blood pressure >100. 90 tablet 1  . hydrocortisone (ANUSOL-HC) 25 MG suppository Place 1 suppository (25 mg total) rectally at bedtime as needed. 12 suppository 6  . ibuprofen (ADVIL,MOTRIN) 200 MG tablet Take 200 mg by mouth every 8 (eight) hours as needed.    [name] levothyroxine (SYNTHROID, LEVOTHROID) 50 MCG tablet TAKE 1 TABLET (50 MCG TOTAL) BY MOUTH DAILY. 90 tablet 3  . losartan-hydrochlorothiazide (HYZAAR) 100-25 MG tablet Take 1 tablet by mouth daily. 30 tablet 5  . Multiple Vitamins-Minerals (MULTIVITAMIN WITH MINERALS) tablet Take  1 tablet by mouth daily.    . simvastatin (ZOCOR) 20 MG tablet Take 0.5 tablets (10 mg total) by mouth daily. 90 tablet 3  . XARELTO 20 MG TABS tablet TAKE 1 TABLET (20 MG TOTAL) BY MOUTH DAILY WITH SUPPER. 90 tablet 3  . diltiazem (CARDIZEM CD) 300 MG 24 hr capsule Take 1 capsule (300 mg total) by mouth daily. 90 capsule 2   No current facility-administered medications for this visit.     Allergies:   Patient has no known allergies.   Social History:  The patient  reports that he quit smoking about 21 years ago. His smoking use included Cigarettes. He has never used smokeless tobacco. He reports that he drinks alcohol. He reports that he does not use drugs.   Family History:  The patient's family history includes Cancer in his paternal aunt; Dementia in his mother; Diabetes in his paternal grandmother; Stroke in his father and paternal aunt.    ROS:  Please see the history of present illness.   Otherwise, review of systems is positive for Fatigue, weakness, and bleeding.   All other systems are reviewed and negative.   PHYSICAL EXAM: VS:  BP 132/84   Pulse 64   Ht 5' 11.5" (1.816 m)   Wt 254 lb 6.4 oz (115.4 kg)   BMI 34.99 kg/m  , BMI Body mass index is 34.99 kg/m. GEN: Well nourished, well developed, in no acute distress  HEENT: normal  Neck: no JVD, carotid bruits, or masses Cardiac: RRR; no murmurs, rubs, or gallops,no edema  Respiratory:  clear to auscultation bilaterally, normal work of breathing GI: soft, nontender, nondistended, + BS MS: no deformity or atrophy  Skin: warm and dry Neuro:  Strength and sensation are intact Psych: euthymic mood, full affect  EKG:  EKG is not ordered today. Personal review of the ekg ordered 06/27/16 shows a paced, rate 60    Recent Labs: 05/14/2016: BUN 20; Creatinine, Ser 1.42; Potassium 4.2; Sodium 143; TSH 2.33 10/10/2016: Hemoglobin WILL FOLLOW; Platelets WILL FOLLOW    Lipid Panel     Component Value Date/Time   CHOL 133  09/29/2015 0916   TRIG 95.0 09/29/2015 0916   TRIG 203 (HH) 12/10/2005 0839   HDL 51.30 09/29/2015 0916   CHOLHDL 3 09/29/2015 0916   VLDL 19.0 09/29/2015 0916   LDLCALC 63 09/29/2015 0916   LDLDIRECT 104.9 06/09/2009 0919     Wt Readings from Last 3 Encounters:  10/17/16 254 lb 6.4 oz (115.4 kg)  09/27/16 249 lb (112.9 kg)  06/27/16 257 lb 9.6 oz (116.8 kg)      Other studies Reviewed: Additional studies/ records that were reviewed today include: TTE 11/10/15 Review of the above records today demonstrates:  - Left ventricle: The cavity size was normal. Wall thickness was   increased in a pattern of mild LVH. The estimated ejection   fraction was 55%. Wall motion was normal; there were no  regional   wall motion abnormalities. Doppler parameters are consistent with   abnormal left ventricular relaxation (grade 1 diastolic   dysfunction). - Aortic valve: There was no stenosis. - Mitral valve: There was trivial regurgitation. - Left atrium: The atrium was mildly dilated. - Right ventricle: The cavity size was normal. Pacer wire or   catheter noted in right ventricle. Systolic function was normal. - Pulmonary arteries: No complete TR doppler jet so unable to   estimate PA systolic pressure. - Inferior vena cava: The vessel was normal in size. The   respirophasic diameter changes were in the normal range (= 50%),   consistent with normal central venous pressure. - Pericardium, extracardiac: A trivial pericardial effusion was   identified posterior to the heart.  Myoview 06/04/14 Low risk stress nuclear study with a small sized, mild intensity reversible defect concering for mild apical-inferior/inferoseptal ischeima.  Clinical Correlation is recommended.  ASSESSMENT AND PLAN:  1.  Paroxysmal atrial fibrillation: Currently on Xarelto but did not tolerate Tikosyn due to QTC prolongation. Has tried amiodarone, but was taken off due to fatigue. This continuing to have episodes of  fatigue, though he is in sinus rhythm today. We'll plan to stop his carvedilol and increase diltiazem to 300 mg.  This patients CHA2DS2-VASc Score and unadjusted Ischemic Stroke Rate (% per year) is equal to 2.2 % stroke rate/year from a score of 2  Above score calculated as 1 point each if present [CHF, HTN, DM, Vascular=MI/PAD/Aortic Plaque, Age if 65-74, or Male] Above score calculated as 2 points each if present [Age > 75, or Stroke/TIA/TE]  2. Hypertension: Well-controlled today  3.  Sinus node dysfunction: Pacemaker functioning well without changes.  4. Shortness of breath: Cardiopulmonary stress testing with restrictive pattern. Shortness of breath has improved. No changes.  Current medicines are reviewed at length with the patient today.   The patient does not have concerns regarding his medicines.  The following changes were made today:  Stop Coreg, increase diltiazem to 300 mg  Labs/ tests ordered today include:  No orders of the defined types were placed in this encounter.    Disposition:   FU with Will Camnitz 6 months  Signed, Will [name], MD  10/17/2016 11:18 AM     [facility] [address] [place] [address] [phone] (office) [phone] (faHe was referred by by his primaryx)

## 2016-10-17 NOTE — Patient Instructions (Addendum)
Medication Instructions: STOP Coreg  START Diltiazem 300 mg Daily  Labwork: None ordered  Procedures/Testing: None ordered  Follow-Up: Remote monitoring is used to monitor your Pacemaker of ICD from home. This monitoring reduces the number of office visits required to check your device to one time per year. It allows [place] to keep an eye on the functioning of your device to ensure it is working properly. You are scheduled for a device check from home on 12/25/2016. You may send your transmission at any time that day. If you have a wireless device, the transmission will be sent automatically. After your physician reviews your transmission, you will receive a postcard with your next transmission date.  Your physician wants you to follow-up in: 6 months with Dr. Curt Bears.  You will receive a reminder letter in the mail two months in advance. If you don't receive a letter, please call our office to schedule the follow-up appointment.  If you need a refill on your cardiac medications before your next appointment, please call your pharmacy.

## 2016-10-18 ENCOUNTER — Ambulatory Visit (INDEPENDENT_AMBULATORY_CARE_PROVIDER_SITE_OTHER): Payer: Medicare Other | Admitting: Internal Medicine

## 2016-10-18 ENCOUNTER — Encounter: Payer: Self-pay | Admitting: Internal Medicine

## 2016-10-18 VITALS — BP 110/60 | HR 68 | Ht 71.5 in | Wt 252.0 lb

## 2016-10-18 DIAGNOSIS — K642 Third degree hemorrhoids: Secondary | ICD-10-CM

## 2016-10-18 LAB — CUP PACEART INCLINIC DEVICE CHECK
Date Time Interrogation Session: 20180822144216
Implantable Lead Implant Date: 20170501
Implantable Lead Implant Date: 20170501
Lead Channel Impedance Value: 525 Ohm
Lead Channel Pacing Threshold Amplitude: 0.75 V
Lead Channel Pacing Threshold Pulse Width: 0.5 ms
Lead Channel Sensing Intrinsic Amplitude: 12 mV
Lead Channel Setting Sensing Sensitivity: 2 mV
MDC IDC LEAD LOCATION: 753859
MDC IDC LEAD LOCATION: 753860
MDC IDC MSMT BATTERY REMAINING LONGEVITY: 111 mo
MDC IDC MSMT BATTERY VOLTAGE: 2.99 V
MDC IDC MSMT LEADCHNL RA IMPEDANCE VALUE: 462.5 Ohm
MDC IDC MSMT LEADCHNL RA PACING THRESHOLD AMPLITUDE: 0.875 V
MDC IDC MSMT LEADCHNL RA SENSING INTR AMPL: 3.4 mV
MDC IDC MSMT LEADCHNL RV PACING THRESHOLD PULSEWIDTH: 0.5 ms
MDC IDC PG IMPLANT DT: 20170501
MDC IDC SET LEADCHNL RA PACING AMPLITUDE: 1.875
MDC IDC SET LEADCHNL RV PACING AMPLITUDE: 2.5 V
MDC IDC SET LEADCHNL RV PACING PULSEWIDTH: 0.5 ms
MDC IDC STAT BRADY RA PERCENT PACED: 95 %
MDC IDC STAT BRADY RV PERCENT PACED: 5 %
Pulse Gen Model: 2272
Pulse Gen Serial Number: 7897836

## 2016-10-18 NOTE — Progress Notes (Signed)
   HEMORRHOIDAL BANDING:  SXS: Bleeding - is on Xarelto for A fib  Rectal:  Gr 3 red inflamed LL internal hemorrhoid, sore and some spasm/stenosis, no mass  Anoscopy:  Gr 3 LL inflamed internal hemorrhoid, Gr 2 RA and RP w/ inflammation  PROCEDURE NOTE: The patient presents with symptomatic grade 2 and 3 hemorrhoids, requesting rubber band ligation of his/her hemorrhoidal disease.  All risks, benefits and alternative forms of therapy were described and informed consent was obtained.   The anorectum was pre-medicated with 0.125% NTG and 5% lidocaine The decision was made to band the LL internal hemorrhoid, and the [place] was used to perform band ligation without complication.  Digital anorectal examination was then performed to assure proper positioning of the band, and to adjust the banded tissue as required.  The patient was discharged home without pain or other issues.  Dietary and behavioral recommendations were given and along with follow-up instructions.     The following adjunctive treatments were recommended:  Hold Xarelto until Monday 8/27 at least - he is to call and update and I will advise  Benefiber 2 tablespons/day Recticare prn  The patient will return in a few weeks for  follow-up and possible additional banding as required. No complications were encountered and the patient tolerated the procedure well.   I appreciate the opportunity to care for this patient.  Cc:Fry, Ishmael Holter, MD Scarlette Shorts, MD

## 2016-10-18 NOTE — Patient Instructions (Addendum)
  HEMORRHOID BANDING PROCEDURE    FOLLOW-UP CARE   1. The procedure you have had should have been relatively painless since the banding of the area involved does not have nerve endings and there is no pain sensation.  The rubber band cuts off the blood supply to the hemorrhoid and the band may fall off as soon as 48 hours after the banding (the band may occasionally be seen in the toilet bowl following a bowel movement). You may notice a temporary feeling of fullness in the rectum which should respond adequately to plain Tylenol or Motrin.  2. Following the banding, avoid strenuous exercise that evening and resume full activity the next day.  A sitz bath (soaking in a warm tub) or bidet is soothing, and can be useful for cleansing the area after bowel movements.     3. To avoid constipation, take two tablespoons of natural wheat bran, natural oat bran, flax, Benefiber or any over the counter fiber supplement and increase your water intake to 7-8 glasses daily.    4. Unless you have been prescribed anorectal medication, do not put anything inside your rectum for two weeks: No suppositories, enemas, fingers, etc.  5. Occasionally, you may have more bleeding than usual after the banding procedure.  This is often from the untreated hemorrhoids rather than the treated one.  Don't be concerned if there is a tablespoon or so of blood.  If there is more blood than this, lie flat with your bottom higher than your head and apply an ice pack to the area. If the bleeding does not stop within a half an hour or if you feel faint, call our office at [phone] or go to the emergency room.  6. Problems are not common; however, if there is a substantial amount of bleeding, severe pain, chills, fever or difficulty passing urine (very rare) or other problems, you should call us at [phone] or report to the nearest emergency room.  7. Do not stay seated continuously for more than 2-3 hours for a day or  two after the procedure.  Tighten your buttock muscles 10-15 times every two hours and take 10-15 deep breaths every 1-2 hours.  Do not spend more than a few minutes on the toilet if you cannot empty your bowel; instead re-visit the toilet at a later time.    Take 2 tablespoons of Benefiber daily, handout provided.   Call us on Monday 10/22/16 with a symptom update so Dr Carlean Purl can determine when to restart your blood thinner.   We will see you back for additional banding on: 11/12/16 at 3:00pm   You can purchase over the counter Recticare to use as needed.  I appreciate the opportunity to care for you. Silvano Rusk, MD, [facility]

## 2016-10-19 ENCOUNTER — Other Ambulatory Visit: Payer: Self-pay | Admitting: Family Medicine

## 2016-10-19 DIAGNOSIS — K641 Second degree hemorrhoids: Secondary | ICD-10-CM | POA: Insufficient documentation

## 2016-10-19 DIAGNOSIS — I480 Paroxysmal atrial fibrillation: Secondary | ICD-10-CM

## 2016-10-19 HISTORY — DX: Second degree hemorrhoids: K64.1

## 2016-10-19 NOTE — Assessment & Plan Note (Signed)
LL banded benefiber Hold Xarelto at least 4 days - he will call me on day 4 and will decide when to restart

## 2016-10-22 ENCOUNTER — Telehealth: Payer: Self-pay | Admitting: Internal Medicine

## 2016-10-22 NOTE — Telephone Encounter (Signed)
OK  Restart Xarelto  Do not take day of next appt but can take day before

## 2016-10-22 NOTE — Telephone Encounter (Signed)
No bleeding , no pain , doing well per wife.  Please advise start back date for his blood thinner.

## 2016-10-22 NOTE — Telephone Encounter (Signed)
Wife informed and verbalized understanding

## 2016-10-31 LAB — CUP PACEART REMOTE DEVICE CHECK
Battery Remaining Longevity: 112 mo
Battery Remaining Percentage: 95.5 %
Battery Voltage: 2.99 V
Brady Statistic AP VP Percent: 4.3 %
Brady Statistic AS VS Percent: 1.9 %
Brady Statistic RV Percent Paced: 4.4 %
Date Time Interrogation Session: 20180731175922
Implantable Lead Implant Date: 20170501
Implantable Lead Location: 753859
Implantable Lead Location: 753860
Implantable Pulse Generator Implant Date: 20170501
Lead Channel Impedance Value: 460 Ohm
Lead Channel Pacing Threshold Amplitude: 0.75 V
Lead Channel Pacing Threshold Pulse Width: 0.5 ms
Lead Channel Pacing Threshold Pulse Width: 0.5 ms
Lead Channel Setting Pacing Amplitude: 1.875
Lead Channel Setting Pacing Amplitude: 2.5 V
Lead Channel Setting Pacing Pulse Width: 0.5 ms
Lead Channel Setting Sensing Sensitivity: 2 mV
MDC IDC LEAD IMPLANT DT: 20170501
MDC IDC MSMT LEADCHNL RA PACING THRESHOLD AMPLITUDE: 0.875 V
MDC IDC MSMT LEADCHNL RA SENSING INTR AMPL: 4.2 mV
MDC IDC MSMT LEADCHNL RV IMPEDANCE VALUE: 530 Ohm
MDC IDC MSMT LEADCHNL RV SENSING INTR AMPL: 12 mV
MDC IDC PG SERIAL: 7897836
MDC IDC STAT BRADY AP VS PERCENT: 94 %
MDC IDC STAT BRADY AS VP PERCENT: 1 %
MDC IDC STAT BRADY RA PERCENT PACED: 97 %

## 2016-11-12 ENCOUNTER — Ambulatory Visit (INDEPENDENT_AMBULATORY_CARE_PROVIDER_SITE_OTHER): Payer: Medicare Other | Admitting: Internal Medicine

## 2016-11-12 ENCOUNTER — Encounter: Payer: Self-pay | Admitting: Internal Medicine

## 2016-11-12 DIAGNOSIS — K642 Third degree hemorrhoids: Secondary | ICD-10-CM

## 2016-11-12 NOTE — Assessment & Plan Note (Addendum)
No sxs at this time after banding of his grade 3 left lateral hemorrhoid complex on August 24. Given that he is asymptomatic and would need to hold Xarelto afterwards I don't think it makes sense to band these other grade 2 internal hemorrhoids in the other positions. I will have him come back to the office in about 2 months and we will reassess. He will continue fiber supplementation and that and the banding of the grade 3 hemorrhoid may take care of his symptoms as it has so far. We can always band later but wanted to try to minimize holding the Xarelto

## 2016-11-12 NOTE — Patient Instructions (Signed)
Continue fiber.   Follow up with Dr. Carlean Purl on 01/11/17 at 3:45pm.

## 2016-11-12 NOTE — Progress Notes (Signed)
Erik [name] 71 y.o. [birth year] [id number]  Assessment & Plan:  Prolapsed internal hemorrhoids, grade 3 No sxs at this time after banding of his grade 3 left lateral hemorrhoid complex on August 24. Given that he is asymptomatic and would need to hold Xarelto afterwards I don't think it makes sense to band these other grade 2 internal hemorrhoids in the other positions. I will have him come back to the office in about 2 months and we will reassess. He will continue fiber supplementation and that and the banding of the grade 3 hemorrhoid may take care of his symptoms as it has so far. We can always band later but wanted to try to minimize holding the Xarelto    I appreciate the opportunity to care for this patient. CC: [name], MD Dr. Scarlette Shorts  Subjective:   Chief Complaint:Follow-up of hemorrhoids after banding  HPI Patient is here with his wife. On August 24 I banded grade 3 left lateral internal hemorrhoid complex. Since that time he says he has done great without any bleeding or symptoms. Slightly sore briefly after the procedure but really overall very well and no the bleeding he was having. He remains on Xarelto. He is trying to take fiber and he says when he does his stools are easier though he sometimes forgets. No Known Allergies Current Meds  Medication Sig  . acetaminophen (TYLENOL) 500 MG tablet Take 1,000 mg by mouth every 6 (six) hours as needed. Reported on 07/06/2015  . diltiazem (CARDIZEM CD) 300 MG 24 hr capsule Take 1 capsule (300 mg total) by mouth daily.  [name] diltiazem (CARDIZEM) 30 MG tablet Cardizem 30mg  -- take 1 tablet every 4 hours AS NEEDED for heart rate >100 as long as blood pressure >100.  . hydrocortisone (ANUSOL-HC) 25 MG suppository Place 1 suppository (25 mg total) rectally at bedtime as needed.  [name] ibuprofen (ADVIL,MOTRIN) 200 MG tablet Take 200 mg by mouth every 8 (eight) hours as needed.  [name] levothyroxine (SYNTHROID, LEVOTHROID) 50 MCG  tablet TAKE 1 TABLET (50 MCG TOTAL) BY MOUTH DAILY.  [name] losartan-hydrochlorothiazide (HYZAAR) 100-25 MG tablet Take 1 tablet by mouth daily.  . Multiple Vitamins-Minerals (MULTIVITAMIN WITH MINERALS) tablet Take 1 tablet by mouth daily.  . simvastatin (ZOCOR) 20 MG tablet Take 0.5 tablets (10 mg total) by mouth daily.  Alveda Reasons 20 MG TABS tablet TAKE 1 TABLET (20 MG TOTAL) BY MOUTH DAILY WITH SUPPER.   Past Medical History:  Diagnosis Date  . BPH with urinary obstruction   . Carotid disease, bilateral ([place])    a. 09/2014 Carotid U/S: 1-39% bilat ICA stenosis.  . Chronic lower back pain   . CKD (chronic kidney disease), stage III   . Diverticulosis   . Gout   . Hyperlipidemia   . Hypertensive heart disease   . Hypothyroidism   . Internal hemorrhoids   . Nephrolithiasis   . Non-obstructive CAD    a. 07/2002 Cath: LM 20, LAD 5m/d, LCX 50-83m, OM1 32m, RCA 19m, EF 60%; b. 05/2014 MV: low risk w/ small sized, mild intensity rev defect in apical/inferior/infsept area, nl EF->Med Rx.  . Paroxysmal atrial fibrillation ([place])    a. Dx 04/2014; b. 05/2014 Echo: Ef 60-65%, no rwma, triv MR/TR, nl RV;  c. CHA2DS2VASc = 3-->was on eliquis, switched to xarelto 04/2015 2/2 cost; d. 05/2015 Tikosyn loaded w/ conversion to AF; e. 06/2015 QTc prolongation and bradycardia->tikosyn d/c'd, PPM placed, Amio started; f. 07/01/2015 In Aflutter @ clinic  f/u.  . Paroxysmal atrial flutter ([place])    a. 06/2015 noted to be in rapid Aflutter in Afib clinic-->amio load continued.  . Tachy-brady syndrome ([place])    a. 06/27/2015 s/p SJM DC PPM (ser # @ [id number]).   Past Surgical History:  Procedure Laterality Date  . COLONOSCOPY  06/27/2015   per Dr. Henrene Pastor, internal hemorrhoids and diverticulae, repeat 10 yrs  . EP IMPLANTABLE DEVICE N/A 06/27/2015   Procedure: Pacemaker Implant;  Surgeon: Evans Lance, MD;  Location: [facility];  Service: Cardiovascular;  Laterality: N/A;  . HAND SURGERY Right   . HEMORRHOID BANDING      . LUMBAR DISC SURGERY  1998   2 lumbar discs, Dr. Ellene Route    Review of Systems As above  Objective:   Physical Exam BP 106/60 (BP Location: Left Arm, Patient Position: Sitting, Cuff Size: Normal)   Pulse 68   Ht 5' 9.5" (1.765 m)   Wt 252 lb 6 oz (114.5 kg)   BMI 36.73 kg/m  No acute distress   15 minutes time spent with patient > half in counseling coordination of care

## 2016-11-14 LAB — AMIODARONE LEVEL
AMIODARONE LVL: 0.3 ug/mL — AB (ref 1.0–2.5)
N-DESETHYL-AMIODARONE: 0.5 ug/mL — AB (ref 1.0–2.5)

## 2016-12-06 ENCOUNTER — Ambulatory Visit (INDEPENDENT_AMBULATORY_CARE_PROVIDER_SITE_OTHER): Payer: Medicare Other | Admitting: Family Medicine

## 2016-12-06 ENCOUNTER — Encounter: Payer: Self-pay | Admitting: Family Medicine

## 2016-12-06 VITALS — BP 118/68 | HR 69 | Temp 97.9°F | Ht 69.5 in | Wt 256.0 lb

## 2016-12-06 DIAGNOSIS — K5732 Diverticulitis of large intestine without perforation or abscess without bleeding: Secondary | ICD-10-CM | POA: Diagnosis not present

## 2016-12-06 MED ORDER — CIPROFLOXACIN HCL 500 MG PO TABS: 500.0000 mg | ORAL_TABLET | Freq: Two times a day (BID) | ORAL | 0 refills | Status: DC

## 2016-12-06 MED ORDER — PROMETHAZINE HCL 25 MG PO TABS: 25.0000 mg | ORAL_TABLET | ORAL | 2 refills | Status: DC | PRN

## 2016-12-06 MED ORDER — METRONIDAZOLE 500 MG PO TABS: 500.0000 mg | ORAL_TABLET | Freq: Three times a day (TID) | ORAL | 0 refills | Status: DC

## 2016-12-06 NOTE — Progress Notes (Signed)
   Subjective:    Patient ID: Erik [name], male    DOB: 1946-01-31, 71 y.o.   MRN: [id number]  HPI Here with his wife for the onset last night of lower abdominal pain, bloating, and nausea without vomiting. No back pain or fever. No urinary symptoms. He had 4 small formed BMs yesterday and another small one this morning. No blood was seen. He notes he has eaten chili with beans each night for the past 3 nights. Diverticula were seen on his colonoscopy last year but he has never had diverticulitis.    Review of Systems  Constitutional: Negative.   Respiratory: Negative.   Cardiovascular: Negative.   Gastrointestinal: Positive for abdominal distention, abdominal pain and nausea. Negative for anal bleeding, blood in stool, constipation, diarrhea, rectal pain and vomiting.  Genitourinary: Negative.        Objective:   Physical Exam  Constitutional:  Ill appearing   Eyes: No scleral icterus.  Neck: No thyromegaly present.  Cardiovascular: Normal rate, regular rhythm, normal heart sounds and intact distal pulses.   Pulmonary/Chest: Effort normal and breath sounds normal. No respiratory distress. He has no wheezes. He has no rales.  Abdominal: Bowel sounds are normal. He exhibits no mass. There is no rebound and no guarding.  He is mildly distended. He is moderately tender in the LLQ.   Lymphadenopathy:    He has no cervical adenopathy.          Assessment & Plan:  Diverticulitis. Treat with Cipro and Flagyl for 10 days. Add Phenergan for nausea. He will drink fluids and eat bland foods for a few days. Recheck prn.  [name], MD

## 2016-12-06 NOTE — Patient Instructions (Signed)
WE NOW OFFER   Erik [name]'s FAST TRACK!!!  SAME DAY Appointments for ACUTE CARE  Such as: Sprains, Injuries, cuts, abrasions, rashes, muscle pain, joint pain, back pain Colds, flu, sore throats, headache, allergies, cough, fever  Ear pain, sinus and eye infections Abdominal pain, nausea, vomiting, diarrhea, upset stomach Animal/insect bites  3 Easy Ways to Schedule: Walk-In Scheduling Call in scheduling Mychart Sign-up: [email or web address]         

## 2016-12-10 ENCOUNTER — Other Ambulatory Visit (HOSPITAL_COMMUNITY): Payer: Self-pay | Admitting: Nurse Practitioner

## 2016-12-17 ENCOUNTER — Other Ambulatory Visit: Payer: Self-pay | Admitting: Family Medicine

## 2016-12-17 DIAGNOSIS — I480 Paroxysmal atrial fibrillation: Secondary | ICD-10-CM

## 2016-12-25 ENCOUNTER — Ambulatory Visit (INDEPENDENT_AMBULATORY_CARE_PROVIDER_SITE_OTHER): Payer: Medicare Other | Admitting: *Deleted

## 2016-12-25 DIAGNOSIS — I495 Sick sinus syndrome: Secondary | ICD-10-CM | POA: Diagnosis not present

## 2016-12-27 DIAGNOSIS — K5792 Diverticulitis of intestine, part unspecified, without perforation or abscess without bleeding: Secondary | ICD-10-CM

## 2016-12-27 HISTORY — DX: Diverticulitis of intestine, part unspecified, without perforation or abscess without bleeding: K57.92

## 2016-12-27 NOTE — Progress Notes (Signed)
Remote pacemaker transmission.   

## 2016-12-31 ENCOUNTER — Encounter: Payer: Self-pay | Admitting: Internal Medicine

## 2016-12-31 ENCOUNTER — Ambulatory Visit (INDEPENDENT_AMBULATORY_CARE_PROVIDER_SITE_OTHER): Payer: Medicare Other | Admitting: Internal Medicine

## 2016-12-31 VITALS — BP 122/70 | HR 70 | Ht 69.0 in | Wt 247.8 lb

## 2016-12-31 DIAGNOSIS — K641 Second degree hemorrhoids: Secondary | ICD-10-CM | POA: Diagnosis not present

## 2016-12-31 NOTE — Progress Notes (Signed)
    Hemorrhoid Ligation Some intermittent bleeding since Aug visit - had been banded LL Gr 3 earlier that month Some diarrhea and increased bleeding after that and had some loose stools - diet-related recently - chili with onions   Rectal exam  - fulness LL, no mass, red blood on finger  Anoscopy Gr 2 bleeding LL Gr 1 RA and Gr 2 RP  PROCEDURE NOTE: The patient presents with symptomatic grade 2  hemorrhoids, requesting rubber band ligation of his/her hemorrhoidal disease.  All risks, benefits and alternative forms of therapy were described and informed consent was obtained.   The anorectum was pre-medicated with 0.125% NTG and 5%lidocaine The decision was made to band the LL internal hemorrhoid, and the [place] was used to perform band ligation without complication.  Digital anorectal examination was then performed to assure proper positioning of the band, and to adjust the banded tissue as required. He did have some pain better after manipulation and loosening.  The patient was discharged home without pain or other issues.  Dietary and behavioral recommendations were given and along with follow-up instructions.     The following adjunctive treatments were recommended:  Hold Xarelto x 4 d - he is to call me on day 4 with an update  The patient will return as needed  for  follow-up and possible additional banding as required. No complications were encountered and the patient tolerated the procedure well.  Cc:Fry, Ishmael Holter, MD

## 2016-12-31 NOTE — Assessment & Plan Note (Signed)
Rebanded LL  RTC prn

## 2016-12-31 NOTE — Patient Instructions (Signed)
HEMORRHOID BANDING PROCEDURE    FOLLOW-UP CARE   1. The procedure you have had should have been relatively painless since the banding of the area involved does not have nerve endings and there is no pain sensation.  The rubber band cuts off the blood supply to the hemorrhoid and the band may fall off as soon as 48 hours after the banding (the band may occasionally be seen in the toilet bowl following a bowel movement). You may notice a temporary feeling of fullness in the rectum which should respond adequately to plain Tylenol or Motrin.  2. Following the banding, avoid strenuous exercise that evening and resume full activity the next day.  A sitz bath (soaking in a warm tub) or bidet is soothing, and can be useful for cleansing the area after bowel movements.     3. To avoid constipation, take two tablespoons of natural wheat bran, natural oat bran, flax, Benefiber or any over the counter fiber supplement and increase your water intake to 7-8 glasses daily.    4. Unless you have been prescribed anorectal medication, do not put anything inside your rectum for two weeks: No suppositories, enemas, fingers, etc.  5. Occasionally, you may have more bleeding than usual after the banding procedure.  This is often from the untreated hemorrhoids rather than the treated one.  Don't be concerned if there is a tablespoon or so of blood.  If there is more blood than this, lie flat with your bottom higher than your head and apply an ice pack to the area. If the bleeding does not stop within a half an hour or if you feel faint, call our office at [phone] or go to the emergency room.  6. Problems are not common; however, if there is a substantial amount of bleeding, severe pain, chills, fever or difficulty passing urine (very rare) or other problems, you should call us at [phone] or report to the nearest emergency room.  7. Do not stay seated continuously for more than 2-3 hours for a day or two  after the procedure.  Tighten your buttock muscles 10-15 times every two hours and take 10-15 deep breaths every 1-2 hours.  Do not spend more than a few minutes on the toilet if you cannot empty your bowel; instead re-visit the toilet at a later time.    We are giving you a handout on sitz baths to read and follow.  Call us Friday and Dr Carlean Purl will let you know when to restart your Xarelto.   I appreciate the opportunity to care for you. Silvano [place]usk, MD, [facility]

## 2017-01-02 ENCOUNTER — Encounter: Payer: Self-pay | Admitting: Cardiology

## 2017-01-11 ENCOUNTER — Ambulatory Visit: Payer: Medicare Other | Admitting: Internal Medicine

## 2017-01-14 LAB — CUP PACEART REMOTE DEVICE CHECK
Battery Remaining Longevity: 108 mo
Battery Remaining Percentage: 95.5 %
Brady Statistic AP VS Percent: 85 %
Brady Statistic AS VS Percent: 11 %
Implantable Lead Implant Date: 20170501
Implantable Lead Location: 753859
Lead Channel Impedance Value: 550 Ohm
Lead Channel Pacing Threshold Amplitude: 0.75 V
Lead Channel Pacing Threshold Amplitude: 1 V
Lead Channel Pacing Threshold Pulse Width: 0.5 ms
Lead Channel Setting Pacing Amplitude: 2.5 V
Lead Channel Setting Pacing Pulse Width: 0.5 ms
Lead Channel Setting Sensing Sensitivity: 2 mV
MDC IDC LEAD IMPLANT DT: 20170501
MDC IDC LEAD LOCATION: 753860
MDC IDC MSMT BATTERY VOLTAGE: 2.99 V
MDC IDC MSMT LEADCHNL RA IMPEDANCE VALUE: 430 Ohm
MDC IDC MSMT LEADCHNL RA PACING THRESHOLD PULSEWIDTH: 0.5 ms
MDC IDC MSMT LEADCHNL RA SENSING INTR AMPL: 3.3 mV
MDC IDC MSMT LEADCHNL RV SENSING INTR AMPL: 12 mV
MDC IDC PG IMPLANT DT: 20170501
MDC IDC PG SERIAL: 7897836
MDC IDC SESS DTM: 20181028060027
MDC IDC SET LEADCHNL RA PACING AMPLITUDE: 2 V
MDC IDC STAT BRADY AP VP PERCENT: 3.9 %
MDC IDC STAT BRADY AS VP PERCENT: 1 %
MDC IDC STAT BRADY RA PERCENT PACED: 68 %
MDC IDC STAT BRADY RV PERCENT PACED: 11 %
Pulse Gen Model: 2272

## 2017-01-15 ENCOUNTER — Telehealth: Payer: Self-pay

## 2017-01-15 NOTE — Telephone Encounter (Signed)
Spoke with patient regarding afib and possibly correlating symptoms. Patient states that he has never noticed any symptoms related to his afib and that since losing over 7lbs in the last month he is feeling really good. I explained that we will continue to monitor and if he develops symptoms in the futuri to call back. Patient verbalized understanding.

## 2017-02-15 ENCOUNTER — Ambulatory Visit (INDEPENDENT_AMBULATORY_CARE_PROVIDER_SITE_OTHER): Payer: Medicare Other | Admitting: Family Medicine

## 2017-02-15 ENCOUNTER — Encounter: Payer: Self-pay | Admitting: Family Medicine

## 2017-02-15 VITALS — BP 136/80 | HR 69 | Temp 98.2°F | Wt 258.0 lb

## 2017-02-15 DIAGNOSIS — N401 Enlarged prostate with lower urinary tract symptoms: Secondary | ICD-10-CM | POA: Diagnosis not present

## 2017-02-15 DIAGNOSIS — Z23 Encounter for immunization: Secondary | ICD-10-CM | POA: Diagnosis not present

## 2017-02-15 DIAGNOSIS — N138 Other obstructive and reflux uropathy: Secondary | ICD-10-CM

## 2017-02-15 LAB — PSA: PSA: 1.03 ng/mL (ref 0.10–4.00)

## 2017-02-15 MED ORDER — TAMSULOSIN HCL 0.4 MG PO CAPS: 0.4000 mg | ORAL_CAPSULE | Freq: Every day | ORAL | 3 refills | Status: DC

## 2017-02-15 NOTE — Addendum Note (Signed)
Addended by: Myriam Forehand on: 02/15/2017 04:20 PM   Modules accepted: Orders

## 2017-02-15 NOTE — Progress Notes (Signed)
   Subjective:    Patient ID: Erik [name], male    DOB: 1945/05/27, 71 y.o.   MRN: [id number]  HPI Here for 2 years of slowly worsening slow urine stream and frequent urinations. He gets up to urinate 6-7 times every night. No pain or burning.    Review of Systems  Constitutional: Negative.   Respiratory: Negative.   Cardiovascular: Negative.   Gastrointestinal: Negative.   Genitourinary: Positive for difficulty urinating and frequency. Negative for dysuria, flank pain, hematuria and urgency.       Objective:   Physical Exam  Constitutional: He appears well-developed and well-nourished.  Cardiovascular: Normal rate, regular rhythm, normal heart sounds and intact distal pulses.  Pulmonary/Chest: Effort normal and breath sounds normal. No respiratory distress. He has no wheezes. He has no rales.  Genitourinary:  Genitourinary Comments: Prostate is moderately enlarged but smooth, not tender           Assessment & Plan:  BPH, we will screen with a PSA today. Try Flomax 0.4 mg daily.  [name], MD

## 2017-02-20 ENCOUNTER — Ambulatory Visit: Payer: Medicare Other | Admitting: Family Medicine

## 2017-03-26 ENCOUNTER — Telehealth: Payer: Self-pay | Admitting: Cardiology

## 2017-03-26 ENCOUNTER — Ambulatory Visit (INDEPENDENT_AMBULATORY_CARE_PROVIDER_SITE_OTHER): Payer: Medicare Other | Admitting: *Deleted

## 2017-03-26 DIAGNOSIS — I495 Sick sinus syndrome: Secondary | ICD-10-CM

## 2017-03-26 NOTE — Telephone Encounter (Signed)
LMOVM reminding pt to send remote transmission.   

## 2017-03-29 ENCOUNTER — Telehealth: Payer: Self-pay

## 2017-03-29 ENCOUNTER — Encounter: Payer: Self-pay | Admitting: Cardiology

## 2017-03-29 NOTE — Telephone Encounter (Signed)
During a review of patient's 2/1 transmission AMS episode was 18%. I called patient and confirmed that patient is still taking xarelto 20mg  daily. Patient confirmed this dosage. Will update patients medication list to reflect this.

## 2017-03-29 NOTE — Progress Notes (Signed)
Remote pacemaker transmission.   

## 2017-04-10 LAB — CUP PACEART REMOTE DEVICE CHECK
Date Time Interrogation Session: 20190213152025
Implantable Lead Implant Date: 20170501
Implantable Lead Location: 753859
Implantable Lead Location: 753860
MDC IDC LEAD IMPLANT DT: 20170501
MDC IDC PG IMPLANT DT: 20170501
Pulse Gen Model: 2272
Pulse Gen Serial Number: 7897836

## 2017-04-14 ENCOUNTER — Other Ambulatory Visit: Payer: Self-pay | Admitting: Family Medicine

## 2017-04-14 DIAGNOSIS — I480 Paroxysmal atrial fibrillation: Secondary | ICD-10-CM

## 2017-04-17 ENCOUNTER — Encounter: Payer: Self-pay | Admitting: Cardiology

## 2017-04-17 ENCOUNTER — Ambulatory Visit (INDEPENDENT_AMBULATORY_CARE_PROVIDER_SITE_OTHER): Payer: Medicare Other | Admitting: Cardiology

## 2017-04-17 ENCOUNTER — Other Ambulatory Visit: Payer: Self-pay | Admitting: Cardiology

## 2017-04-17 VITALS — BP 108/78 | HR 101 | Ht 71.5 in | Wt 267.0 lb

## 2017-04-17 DIAGNOSIS — I480 Paroxysmal atrial fibrillation: Secondary | ICD-10-CM

## 2017-04-17 DIAGNOSIS — I481 Persistent atrial fibrillation: Secondary | ICD-10-CM | POA: Diagnosis not present

## 2017-04-17 DIAGNOSIS — I495 Sick sinus syndrome: Secondary | ICD-10-CM

## 2017-04-17 DIAGNOSIS — R001 Bradycardia, unspecified: Secondary | ICD-10-CM

## 2017-04-17 DIAGNOSIS — I10 Essential (primary) hypertension: Secondary | ICD-10-CM

## 2017-04-17 DIAGNOSIS — Z01812 Encounter for preprocedural laboratory examination: Secondary | ICD-10-CM

## 2017-04-17 DIAGNOSIS — I4819 Other persistent atrial fibrillation: Secondary | ICD-10-CM

## 2017-04-17 LAB — CUP PACEART INCLINIC DEVICE CHECK
Battery Remaining Longevity: 112 mo
Battery Voltage: 2.99 V
Brady Statistic RA Percent Paced: 69 %
Implantable Lead Implant Date: 20170501
Implantable Pulse Generator Implant Date: 20170501
Lead Channel Impedance Value: 462.5 Ohm
Lead Channel Impedance Value: 575 Ohm
Lead Channel Pacing Threshold Amplitude: 1.25 V
Lead Channel Pacing Threshold Pulse Width: 0.5 ms
Lead Channel Sensing Intrinsic Amplitude: 1 mV
Lead Channel Setting Pacing Amplitude: 1.875
Lead Channel Setting Pacing Pulse Width: 0.5 ms
MDC IDC LEAD IMPLANT DT: 20170501
MDC IDC LEAD LOCATION: 753859
MDC IDC LEAD LOCATION: 753860
MDC IDC MSMT LEADCHNL RV PACING THRESHOLD AMPLITUDE: 1.25 V
MDC IDC MSMT LEADCHNL RV PACING THRESHOLD PULSEWIDTH: 0.5 ms
MDC IDC MSMT LEADCHNL RV SENSING INTR AMPL: 12 mV
MDC IDC PG SERIAL: 7897836
MDC IDC SESS DTM: 20190220113732
MDC IDC SET LEADCHNL RV PACING AMPLITUDE: 2.5 V
MDC IDC SET LEADCHNL RV SENSING SENSITIVITY: 2 mV
MDC IDC STAT BRADY RV PERCENT PACED: 10 %

## 2017-04-17 NOTE — Addendum Note (Signed)
Addended by: Stanton Kidney on: 04/17/2017 05:45 PM   Modules accepted: Orders

## 2017-04-17 NOTE — Progress Notes (Signed)
Electrophysiology Office Note   Date:  04/17/2017   ID:  Erik, [name] 12-11-1945, MRN [id number]  PCP:  [name], MD  Cardiologist:  [name] Primary Electrophysiologist:  Constance Haw, MD    Chief Complaint  Patient presents with  . Pacemaker Check    Sick sinus syndrome/PAF     History of Present Illness: Erik [name] is a 72 y.o. male who presents today for electrophysiology evaluation.   He presents today for workup of his atrial fibrillation.  He has a history of nonobstructive coronary artery disease, hypertension, hyperlipidemia, hypothyroidism. In March 2016, he was diagnosed with atrial fibrillation with RVR in the setting of chest pain. He was loaded on tikosyn which was stopped as he went back in atrial fibrillation on 500 mcg twice a day..  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    Today, denies symptoms of palpitations, chest pain, orthopnea, PND, lower extremity edema, claudication, dizziness, presyncope, syncope, bleeding, or neurologic sequela. The patient is tolerating medications without difficulties.  Symptoms are weakness fatigue and shortness of breath.  Device interrogation and EKG today showed that he is in atrial fibrillation.  He says that when he was initially loaded on the dofetilide, he felt well until he went back into atrial fibrillation.  He has since been having weakness fatigue and shortness of breath.  He feels that his symptoms could possibly be due to his atrial fibrillation.   Past Medical History:  Diagnosis Date  . BPH with urinary obstruction   . Carotid disease, bilateral ([place])    a. 09/2014 Carotid U/S: 1-39% bilat ICA stenosis.  . Chronic lower back pain   . CKD (chronic kidney disease), stage III ([place])   . Diverticulosis   . Gout   . Hyperlipidemia   . Hypertensive heart disease   . Hypothyroidism   . Internal hemorrhoids   . Nephrolithiasis   . Non-obstructive CAD    a. 07/2002  Cath: LM 20, LAD 44m/d, LCX 50-51m, OM1 6m, RCA 72m, EF 60%; b. 05/2014 MV: low risk w/ small sized, mild intensity rev defect in apical/inferior/infsept area, nl EF->Med Rx.  . Paroxysmal atrial fibrillation ([place])    a. Dx 04/2014; b. 05/2014 Echo: Ef 60-65%, no rwma, triv MR/TR, nl RV;  c. CHA2DS2VASc = 3-->was on eliquis, switched to xarelto 04/2015 2/2 cost; d. 05/2015 Tikosyn loaded w/ conversion to AF; e. 06/2015 QTc prolongation and bradycardia->tikosyn d/c'd, PPM placed, Amio started; f. 07/01/2015 In Aflutter @ clinic f/u.  [name] Paroxysmal atrial flutter ([place])    a. 06/2015 noted to be in rapid Aflutter in Afib clinic-->amio load continued.  . Prolapsed internal hemorrhoids, grade 2-3 10/19/2016   LL Gr 3 and RP/RA Gr 2 LL banded 10/19/2016   . RLL pneumonia ([place]) 11/05/2012  . Tachy-brady syndrome ([place])    a. 06/27/2015 s/p SJM DC PPM (ser # @ [id number]).   Past Surgical History:  Procedure Laterality Date  . COLONOSCOPY  06/27/2015   per Dr. Henrene Pastor, internal hemorrhoids and diverticulae, repeat 10 yrs  . EP IMPLANTABLE DEVICE N/A 06/27/2015   Procedure: Pacemaker Implant;  Surgeon: Evans Lance, MD;  Location: [place] [facility];  Service: Cardiovascular;  Laterality: N/A;  . HAND SURGERY Right   . HEMORRHOID BANDING    . LUMBAR DISC SURGERY  1998   2 lumbar discs, Dr. Ellene Route      Current Outpatient Medications  Medication Sig Dispense Refill  .  acetaminophen (TYLENOL) 500 MG tablet Take 1,000 mg by mouth every 6 (six) hours as needed. Reported on 07/06/2015    . diltiazem (CARDIZEM CD) 300 MG 24 hr capsule Take 1 capsule (300 mg total) by mouth daily. 90 capsule 2  . diltiazem (CARDIZEM) 30 MG tablet Cardizem 30mg  -- take 1 tablet every 4 hours AS NEEDED for heart rate >100 as long as blood pressure >100. 90 tablet 1  . hydrocortisone (ANUSOL-HC) 25 MG suppository Place 1 suppository (25 mg total) rectally at bedtime as needed. 12 suppository 6  . ibuprofen (ADVIL,MOTRIN) 200 MG tablet Take 200  mg by mouth every 8 (eight) hours as needed.    [name] levothyroxine (SYNTHROID, LEVOTHROID) 50 MCG tablet TAKE 1 TABLET (50 MCG TOTAL) BY MOUTH DAILY. 90 tablet 3  . losartan-hydrochlorothiazide (HYZAAR) 100-25 MG tablet TAKE 1 TABLET BY MOUTH EVERY DAY 90 tablet 2  . Multiple Vitamins-Minerals (MULTIVITAMIN WITH MINERALS) tablet Take 1 tablet by mouth daily.    . rivaroxaban (XARELTO) 20 MG TABS tablet Take 20 mg by mouth daily with supper.    . simvastatin (ZOCOR) 20 MG tablet Take 10 mg by mouth daily.  2  . tamsulosin (FLOMAX) 0.4 MG CAPS capsule Take 1 capsule (0.4 mg total) by mouth daily. 30 capsule 3   No current facility-administered medications for this visit.     Allergies:   Patient has no known allergies.   Social History:  The patient  reports that he quit smoking about 22 years ago. His smoking use included cigarettes. he has never used smokeless tobacco. He reports that he drinks alcohol. He reports that he does not use drugs.   Family History:  The patient's family history includes Cancer in his paternal aunt; Dementia in his mother; Diabetes in his paternal grandmother; Stroke in his father and paternal aunt.   ROS:  Please see the history of present illness.   Otherwise, review of systems is positive for weakness, fatigue, shortness of breath.   All other systems are reviewed and negative.   PHYSICAL EXAM: VS:  BP 108/78   Pulse (!) 101   Ht 5' 11.5" (1.816 m)   Wt 267 lb (121.1 kg)   BMI 36.72 kg/m  , BMI Body mass index is 36.72 kg/m. GEN: Well nourished, well developed, in no acute distress  HEENT: normal  Neck: no JVD, carotid bruits, or masses Cardiac: iRRR; no murmurs, rubs, or gallops,no edema  Respiratory:  clear to auscultation bilaterally, normal work of breathing GI: soft, nontender, nondistended, + BS MS: no deformity or atrophy  Skin: warm and dry Neuro:  Strength and sensation are intact Psych: euthymic mood, full affect  EKG:  EKG is ordered  today. Personal review of the ekg ordered shows atrial fibrillation, rate 101, PVCs   Recent Labs: 05/14/2016: BUN 20; Creatinine, Ser 1.42; Potassium 4.2; Sodium 143; TSH 2.33 10/10/2016: Hemoglobin Josten Warmuth FOLLOW; Platelets Consuelo Thayne FOLLOW    Lipid Panel     Component Value Date/Time   CHOL 133 09/29/2015 0916   TRIG 95.0 09/29/2015 0916   TRIG 203 (HH) 12/10/2005 0839   HDL 51.30 09/29/2015 0916   CHOLHDL 3 09/29/2015 0916   VLDL 19.0 09/29/2015 0916   LDLCALC 63 09/29/2015 0916   LDLDIRECT 104.9 06/09/2009 0919     Wt Readings from Last 3 Encounters:  04/17/17 267 lb (121.1 kg)  02/15/17 258 lb (117 kg)  12/31/16 247 lb 12.8 oz (112.4 kg)      Other studies Reviewed:  Additional studies/ records that were reviewed today include: TTE 11/10/15 Review of the above records today demonstrates:  - Left ventricle: The cavity size was normal. Wall thickness was   increased in a pattern of mild LVH. The estimated ejection   fraction was 55%. Wall motion was normal; there were no regional   wall motion abnormalities. Doppler parameters are consistent with   abnormal left ventricular relaxation (grade 1 diastolic   dysfunction). - Aortic valve: There was no stenosis. - Mitral valve: There was trivial regurgitation. - Left atrium: The atrium was mildly dilated. - Right ventricle: The cavity size was normal. Pacer wire or   catheter noted in right ventricle. Systolic function was normal. - Pulmonary arteries: No complete TR doppler jet so unable to   estimate PA systolic pressure. - Inferior vena cava: The vessel was normal in size. The   respirophasic diameter changes were in the normal range (= 50%),   consistent with normal central venous pressure. - Pericardium, extracardiac: A trivial pericardial effusion was   identified posterior to the heart.  Myoview 06/04/14 Low risk stress nuclear study with a small sized, mild intensity reversible defect concering for mild  apical-inferior/inferoseptal ischeima.  Clinical Correlation is recommended.  ASSESSMENT AND PLAN:  1.  Persistent atrial fibrillation: Currently on Xarelto.  Unfortunately he has been loaded on dofetilide and in the past and went back into atrial fibrillation while on dofetilide.  I did offer him a another attempt at amiodarone, but due to its side effects, he would prefer not to be on this medication.  Discussed ablation.  Risks and benefits discussed and include bleeding, tamponade, heart block, stroke, and damage to surrounding organs.  The patient understands these risks and is agreed to the procedure.  This patients CHA2DS2-VASc Score and unadjusted Ischemic Stroke Rate (% per year) is equal to 2.2 % stroke rate/year from a score of 2  Above score calculated as 1 point each if present [CHF, HTN, DM, Vascular=MI/PAD/Aortic Plaque, Age if 65-74, or Male] Above score calculated as 2 points each if present [Age > 75, or Stroke/TIA/TE]   2. Hypertension: Well-controlled today.  3.  Sinus node dysfunction: Saint Jude dual-chamber pacemaker functioning appropriately.  No changes.  4. Shortness of breath: Shortness of breath possibly due to increased atrial fibrillation.  Plan for ablation.  Current medicines are reviewed at length with the patient today.   The patient does not have concerns regarding his medicines.  The following changes were made today: None  Labs/ tests ordered today include:  Orders Placed This Encounter  Procedures  . Basic Metabolic Panel (BMET)  . CBC w/Diff  . EKG 12-Lead     Disposition:   FU with Keauna Brasel 3 months  Signed, Corah Willeford [name], MD  04/17/2017 10:55 AM     [facility] [address] [place] [address] [phone] (office) [phone]

## 2017-04-17 NOTE — Patient Instructions (Addendum)
Medication Instructions:  Your physician recommends that you continue on your current medications as directed. Please refer to the Current Medication list given to you today.  * If you need a refill on your cardiac medications before your next appointment, please call your pharmacy. *  Labwork: Your physician recommends that you return for pre procedure lab work between 05/03/2017 - 05/09/2017  Testing/Procedures: Your physician has requested that you have cardiac CT within  7 days prior to your procedure on 05/17/17. Cardiac computed tomography (CT) is a painless test that uses an x-ray machine to take clear, detailed pictures of your heart. For further information please visit [employer]. Please follow instruction sheet as given.  The office will call you to arrange this testing.  CT INSTRUCTIONS Please arrive at the [facility] main entrance of [facility] at _______ AM/PM (30-45 minutes prior to test start time)  [facility] [address] [place], [place] [address] [phone]  Proceed to the [facility] Radiology Department (First Floor).  Please follow these instructions carefully (unless otherwise directed):  Hold all erectile dysfunction medications at least 48 hours prior to test.  On the Night Before the Test: . Drink plenty of water. . Do not consume any caffeinated/decaffeinated beverages or chocolate 12 hours prior to your test. . Do not take any antihistamines 12 hours prior to your test.  On the Day of the Test: . Drink plenty of water. Do not drink any water within one hour of the test. . Do not eat any food 4 hours prior to the test. . You may take your regular medications prior to the test.  After the Test: . Drink plenty of water. . After receiving IV contrast, you may experience a mild flushed feeling. This is normal. . On occasion, you may experience a mild rash up to 24 hours after the test. This is not dangerous. If this  occurs, you can take Benadryl 25 mg and increase your fluid intake. . If you experience trouble breathing, this can be serious. If it is severe call 911 IMMEDIATELY. If it is mild, please call our office. . If you take any of these medications: Glipizide/Metformin, Avandament, Glucavance, please do not take 48 hours after completing test   Your physician has recommended that you have an ablation. Catheter ablation is a medical procedure used to treat some cardiac arrhythmias (irregular heartbeats). During catheter ablation, a long, thin, flexible tube is put into a blood vessel in your groin (upper thigh), or neck. This tube is called an ablation catheter. It is then guided to your heart through the blood vessel. Radio frequency waves destroy small areas of heart tissue where abnormal heartbeats may cause an arrhythmia to start. Please see the instruction sheet given to you today.                                     Electrophysiology/Ablation Procedure Instructions You are scheduled for a(n) Atrial Fibrillation Ablation  on  05/17/2017  with Dr. Curt Bears.  1.   Please come to the [place], Entrance "A" at [facility] at 5:30 a.m.           on the day of your procedure.  1. Come prepared to stay overnight.  Please bring your insurance cards and a list of your medications.  3.   Come to the office on _________ for lab work.  The lab at [facility] is       open  from 8:00 AM to 5:00 PM.   You do not have to be fasting.   4. Do not have anything to eat or drink after midnight the night before your procedure.  5.  A) MAKE SURE TO CONTINUE TAKING YOUR XARELTO.  YOU WILL TAKE THIS                MEDICATION ALL THE WAY UP TO THE DAY OF YOUR PROCEDURE. YOU                  CANNOT MISS ANY DOSES.  If you do please call the office because we will                     need to rescheduled your procedure.       B) Hold all of your medications the morning of your procedure.  6. You will  be shaved for this procedure.  Typically both groins, chest and back are              shaven. You are welcome to shave these areas yourself at home prior to the                    procedure. Otherwise, we will shave these areas in the hospital the day of your                procedure.  7.  Educational material received:  EP   Ablation  * Occasionally, EP Studies and ablations can become lengthy.  Please make your family aware of this before your procedure starts.  Average time ranges from 2-8 hours for EP studies/ablations.  Your physician will locate your family after the procedure with the results.  * If you have any questions after you get home, please call Trinidad Curet, RN at [phone].   Follow-Up: Your physician recommends that you schedule a follow-up appointment on 05/13/2017 with Dr. Curt Bears.  Your physician recommends that you schedule a follow-up appointment in: 4 weeks, after your procedure on 05/17/2017, with Roderic Palau in the Afib clinic.  Your physician recommends that you schedule a follow-up appointment in: 3 months, after your procedure on 05/17/2017, with Dr. Curt Bears.  Thank you for choosing [facility]!!   Trinidad Curet, RN [phone]   Any Other Special Instructions Will Be Listed Below (If Applicable).  Cardiac Ablation Cardiac ablation is a procedure to stop some heart tissue from causing problems. The heart has many electrical connections. Sometimes these connections make the heart beat very fast or irregularly. Removing some problem areas can improve the heart rhythm or make it normal. What happens before the procedure?  Follow instructions from your doctor about what you cannot eat or drink.  Ask your doctor about: ? Changing or stopping your normal medicines. This is important if you take diabetes medicines or blood thinners. ? Taking medicines such as aspirin and ibuprofen. These medicines can thin your blood. Do not take these medicines before  your procedure if your doctor tells you not to.  Plan to have someone take you home.  If you will be going home right after the procedure, plan to have someone with you for 24 hours. What happens during the procedure?  To lower your risk of infection: ? Your health care team will wash or sanitize their hands. ? Your skin will be washed with soap. ?  Hair may be removed from your neck or groin.  An IV tube will be put into one of your veins.  You will be given a medicine to help you relax (sedative).  Skin on your neck or groin will be numbed.  A cut (incision) will be made in your neck or groin.  A needle will be put through your cut and into a vein in your neck or groin.  A tube (catheter) will be put into the needle. The tube will be moved to your heart. X-rays (fluoroscopy) will be used to help guide the tube.  Small devices (electrodes) on the tip of the tube will send out electrical currents.  Dye may be put through the tube. This helps your surgeon see your heart.  Electrical energy will be used to scar (ablate) some heart tissue. Your surgeon may use: ? Heat (radiofrequency energy). ? Laser energy. ? Extreme cold (cryoablation).  The tube will be taken out.  Pressure will be held on your cut. This helps stop bleeding.  A bandage (dressing) will be put on your cut. The procedure may vary. What happens after the procedure?  You will be monitored until your medicines have worn off.  Your cut will be watched for bleeding. You will need to lie still for a few hours.  Do not drive for 24 hours or as long as your doctor tells you. Summary  Cardiac ablation is a procedure to stop some heart tissue from causing problems.  Electrical energy will be used to scar (ablate) some heart tissue. This information is not intended to replace advice given to you by your health care provider. Make sure you discuss any questions you have with your health care provider. Document  Released: 10/15/2012 Document Revised: 01/02/2016 Document Reviewed: 01/02/2016 Elsevier Interactive Patient Education  2017 [employer].

## 2017-05-02 ENCOUNTER — Telehealth: Payer: Self-pay | Admitting: Cardiology

## 2017-05-02 NOTE — Telephone Encounter (Signed)
New message    Patient spouse calling to confirm procedure dates and instructions. Please call

## 2017-05-02 NOTE — Telephone Encounter (Signed)
Spoke to wife again who tells me that she is returning call to [place].  Explained that CT has already been scheduled and that was most likely the message she received. Aware of date/time/instructions for CT.  Wife confirms they have all the information/instructions for procedure and CT testing.

## 2017-05-03 ENCOUNTER — Other Ambulatory Visit: Payer: Medicare Other

## 2017-05-13 ENCOUNTER — Encounter: Payer: Self-pay | Admitting: Cardiology

## 2017-05-13 ENCOUNTER — Ambulatory Visit (INDEPENDENT_AMBULATORY_CARE_PROVIDER_SITE_OTHER): Payer: Medicare Other | Admitting: Cardiology

## 2017-05-13 VITALS — BP 122/78 | HR 80 | Ht 71.0 in | Wt 259.8 lb

## 2017-05-13 DIAGNOSIS — I495 Sick sinus syndrome: Secondary | ICD-10-CM | POA: Diagnosis not present

## 2017-05-13 DIAGNOSIS — I480 Paroxysmal atrial fibrillation: Secondary | ICD-10-CM

## 2017-05-13 DIAGNOSIS — Z01812 Encounter for preprocedural laboratory examination: Secondary | ICD-10-CM | POA: Diagnosis not present

## 2017-05-13 DIAGNOSIS — I10 Essential (primary) hypertension: Secondary | ICD-10-CM

## 2017-05-13 LAB — CUP PACEART INCLINIC DEVICE CHECK
Implantable Lead Implant Date: 20170501
Implantable Lead Implant Date: 20170501
Implantable Lead Location: 753860
Implantable Pulse Generator Implant Date: 20170501
MDC IDC LEAD LOCATION: 753859
MDC IDC PG SERIAL: 7897836
MDC IDC SESS DTM: 20190318172243
Pulse Gen Model: 2272

## 2017-05-13 MED ORDER — LOSARTAN POTASSIUM 50 MG PO TABS: 50.0000 mg | ORAL_TABLET | Freq: Every day | ORAL | 3 refills | Status: DC

## 2017-05-13 NOTE — Patient Instructions (Addendum)
Medication Instructions:  Your physician recommends that you continue on your current medications as directed. Please refer to the Current Medication list given to you today.  Labwork: Pre procedure labs today: CBC w/ diff & BMP  Testing/Procedures: Your physician has requested that you have cardiac CT. Cardiac computed tomography (CT) is a painless test that uses an x-ray machine to take clear, detailed pictures of your heart. For further information please visit [employer]. Please follow instruction sheet as given.  You are scheduled for this testing tomorrow.  Please review instructions  below.  Your physician has recommended that you have an ablation. Catheter ablation is a medical procedure used to treat some cardiac arrhythmias (irregular heartbeats). During catheter ablation, a long, thin, flexible tube is put into a blood vessel in your groin (upper thigh), or neck. This tube is called an ablation catheter. It is then guided to your heart through the blood vessel. Radio frequency waves destroy small areas of heart tissue where abnormal heartbeats may cause an arrhythmia to start.   You are scheduled for this procedure Friday, 3/22.  Please see the  instructions below  Follow-Up: Please keep your post ablation follow up appointments.  * If you need a refill on your cardiac medications before your next appointment, please call your pharmacy.   *Please note that any paperwork needing to be filled out by the provider will need to be addressed at the front desk prior to seeing the provider. Please note that any FMLA, disability or other documents regarding health condition is subject to a $25.00 charge that must be received prior to completion of paperwork in the form of a money order or check.  Thank you for choosing [facility]!!   Trinidad Curet, RN [phone]  Any Other Special Instructions Will Be Listed Below (If Applicable).   Please arrive at the [facility] main  entrance of [facility] at 9:00 AM (30-45 minutes prior to test start time)  [facility] [address] [place], [place] [address] [phone]  Proceed to the [facility] Radiology Department (First Floor).  Please follow these instructions carefully (unless otherwise directed):  Hold all erectile dysfunction medications at least 48 hours prior to test.  On the Night Before the Test: . Drink plenty of water. . Do not consume any caffeinated/decaffeinated beverages or chocolate 12 hours prior to your test. . Do not take any antihistamines 12 hours prior to your test.  On the Day of the Test: . Drink plenty of water. Do not drink any water within one hour of the test. . Do not eat any food 4 hours prior to the test. . You may take your regular medications prior to the test. . TAKE your DILTIAZEM the morning of the test . HOLD Furosemide morning of the test.  After the Test: . Drink plenty of water. . After receiving IV contrast, you may experience a mild flushed feeling. This is normal. . On occasion, you may experience a mild rash up to 24 hours after the test. This is not dangerous. If this occurs, you can take Benadryl 25 mg and increase your fluid intake. . If you experience trouble breathing, this can be serious. If it is severe call 911 IMMEDIATELY. If it is mild, please call our office. . If you take any of these medications: Glipizide/Metformin, Avandament, Glucavance, please do not take 48 hours after completing test.     Electrophysiology/Ablation Procedure Instructions You are scheduled for a(n) Atrial Fibrillation  Ablation on 05/17/2017  with Dr. Curt Bears.  1.   Please come to the [place], Entrance "A" at [facility] at 5:30       a.m. on the day of your procedure.  1. Come prepared to stay overnight.  Please bring your insurance cards and a list of your medications.  3.   Come to the office on 05/13/2017 for lab work.  The lab at  [facility] is       open  from 8:00 AM to 5:00 PM.   You do not have to be fasting.   4. Do not have anything to eat or drink after midnight the night before your procedure.  5.  A) MAKE SURE TO CONTINUE TAKING YOUR XARELTO.  YOU WILL TAKE THIS          MEDICATION ALL THE WAY UP TO THE DAY OF YOUR PROCEDURE. YOU          CANNOT MISS ANY DOSES.  If you do please call the office because we will          need to rescheduled your procedure.       B) Hold all of your medications the morning of your procedure.  6. You will be shaved for this procedure.  Typically both groins, chest and back are     shaven. You are welcome to shave these areas yourself at home prior to the     procedure. Otherwise, we will shave these areas in the hospital the day of your     procedure.  7.  Educational material received:  EP   Ablation  * Occasionally, EP Studies and ablations can become lengthy.  Please make your family aware of this before your procedure starts.  Average time ranges from 2-8 hours for EP studies/ablations.  Your physician will locate your family after the procedure with the results.  * If you have any questions after you get home, please call Trinidad Curet, RN at [phone].

## 2017-05-13 NOTE — Progress Notes (Signed)
Electrophysiology Office Note   Date:  05/13/2017   ID:  Erik [name], DOB 1945-10-22, MRN [id number]  PCP:  Erik Morale, MD  Cardiologist:  Erik [name] Primary Electrophysiologist:  Erik Haw, MD    Chief Complaint  Patient presents with  . Atrial Fibrillation     History of Present Illness: Erik [name] is a 72 y.o. male who presents today for electrophysiology evaluation.   He presents today for workup of his atrial fibrillation.  He has a history of nonobstructive coronary artery disease, hypertension, hyperlipidemia, hypothyroidism. In March 2016, he was diagnosed with atrial fibrillation with RVR in the setting of chest pain. He was loaded on tikosyn which was stopped as he went back in atrial fibrillation on 500 mcg twice a day..  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  Was noted to be back in AF and now plan for ablation on 05/17/17.  Today, denies symptoms of palpitations, chest pain, shortness of breath, orthopnea, PND, lower extremity edema, claudication, dizziness, presyncope, syncope, bleeding, or neurologic sequela. The patient is tolerating medications without difficulties.  He is overall feeling well today.  He has been in and out of atrial fibrillation over the past few main complaint is fatigue and weakness.   Past Medical History:  Diagnosis Date  . BPH with urinary obstruction   . Carotid disease, bilateral ([place])    a. 09/2014 Carotid U/S: 1-39% bilat ICA stenosis.  . Chronic lower back pain   . CKD (chronic kidney disease), stage III ([place])   . Diverticulosis   . Gout   . Hyperlipidemia   . Hypertensive heart disease   . Hypothyroidism   . Internal hemorrhoids   . Nephrolithiasis   . Non-obstructive CAD    a. 07/2002 Cath: LM 20, LAD 72m/[name], LCX 50-13m, OM1 59m, RCA 10m, EF 60%; b. 05/2014 MV: low risk w/ small sized, mild intensity rev defect in apical/inferior/infsept area, nl EF->Med Rx.  . Paroxysmal atrial  fibrillation ([employer])    a. Dx 04/2014; b. 05/2014 Echo: Ef 60-65%, no rwma, triv MR/TR, nl RV;  c. CHA2DS2VASc = 3-->was on eliquis, switched to xarelto 04/2015 2/2 cost; [name]. 05/2015 Tikosyn loaded w/ conversion to AF; e. 06/2015 QTc prolongation and bradycardia->tikosyn [name]/c'[name], PPM placed, Amio started; f. 07/01/2015 In Aflutter @ clinic f/u.  [name] Paroxysmal atrial flutter ([place])    a. 06/2015 noted to be in rapid Aflutter in Afib clinic-->amio load continued.  . Prolapsed internal hemorrhoids, grade 2-3 10/19/2016   LL Gr 3 and RP/RA Gr 2 LL banded 10/19/2016   . RLL pneumonia ([place]) 11/05/2012  . Tachy-brady syndrome ([place])    a. 06/27/2015 s/p SJM DC PPM (ser # @ [id number]).   Past Surgical History:  Procedure Laterality Date  . COLONOSCOPY  06/27/2015   per Erik [name], internal hemorrhoids and diverticulae, repeat 10 yrs  . EP IMPLANTABLE DEVICE N/A 06/27/2015   Procedure: Pacemaker Implant;  Surgeon: Erik Lance, MD;  Location: [place] [facility];  Service: Cardiovascular;  Laterality: N/A;  . HAND SURGERY Right   . HEMORRHOID BANDING    . LUMBAR DISC SURGERY  1998   2 lumbar discs, Dr. Ellene [name]      Current Outpatient Medications  Medication Sig Dispense Refill  . acetaminophen (TYLENOL) 500 MG tablet Take 1,000 mg by mouth every 6 (six) hours as needed for moderate pain or headache.     . diltiazem (CARDIZEM CD) 300 MG 24 hr  capsule Take 1 capsule (300 mg total) by mouth daily. 90 capsule 2  . diltiazem (CARDIZEM) 30 MG tablet Cardizem 30mg  -- take 1 tablet every 4 hours AS NEEDED for heart rate >100 as long as blood pressure >100. (Patient taking differently: Take 30 mg by mouth every 4 (four) hours as needed (for heart rate > 100 as long as blood pressure > 100). ) 90 tablet 1  . hydrocortisone (ANUSOL-HC) 25 MG suppository Place 1 suppository (25 mg total) rectally at bedtime as needed. (Patient taking differently: Place 25 mg rectally at bedtime as needed for hemorrhoids. ) 12 suppository 6  .  ibuprofen (ADVIL,MOTRIN) 200 MG tablet Take 200 mg by mouth every 8 (eight) hours as needed for headache or moderate pain.     [name] levothyroxine (SYNTHROID, LEVOTHROID) 50 MCG tablet TAKE 1 TABLET (50 MCG TOTAL) BY MOUTH DAILY. 90 tablet 3  . losartan-hydrochlorothiazide (HYZAAR) 100-25 MG tablet TAKE 1 TABLET BY MOUTH EVERY DAY 90 tablet 2  . rivaroxaban (XARELTO) 20 MG TABS tablet Take 20 mg by mouth daily with supper.    . simvastatin (ZOCOR) 20 MG tablet Take 10 mg by mouth daily.  2  . tamsulosin (FLOMAX) 0.4 MG CAPS capsule Take 1 capsule (0.4 mg total) by mouth daily. 30 capsule 3   No current facility-administered medications for this visit.     Allergies:   Patient has no known allergies.   Social History:  The patient  reports that he quit smoking about 22 years ago. His smoking use included cigarettes. he has never used smokeless tobacco. He reports that he drinks alcohol. He reports that he does not use drugs.   Family History:  The patient's family history includes Cancer in his paternal aunt; Dementia in his mother; Diabetes in his paternal grandmother; Stroke in his father and paternal aunt.   ROS:  Please see the history of present illness.   Otherwise, review of systems is positive for none.   All other systems are reviewed and negative.   PHYSICAL EXAM: VS:  BP 122/78   Pulse 80   Ht 5\' 11"  (1.803 m)   Wt 259 lb 12.8 oz (117.8 kg)   BMI 36.23 kg/m  , BMI Body mass index is 36.23 kg/m. GEN: Well nourished, well developed, in no acute distress  HEENT: normal  Neck: no JVD, carotid bruits, or masses Cardiac: RRR; no murmurs, rubs, or gallops,no edema  Respiratory:  clear to auscultation bilaterally, normal work of breathing GI: soft, nontender, nondistended, + BS MS: no deformity or atrophy  Skin: warm and dry Neuro:  Strength and sensation are intact Psych: euthymic mood, full affect  EKG:  EKG is not ordered today. Personal review of the ekg ordered 04/17/17 shows  atrial fibrillation, rate 101   Recent Labs: 05/14/2016: BUN 20; Creatinine, Ser 1.42; Potassium 4.2; Sodium 143; TSH 2.33 10/10/2016: Hemoglobin Erik Desaulniers FOLLOW; Platelets Jeffie Widdowson FOLLOW    Lipid Panel     Component Value Date/Time   CHOL 133 09/29/2015 0916   TRIG 95.0 09/29/2015 0916   TRIG 203 (HH) 12/10/2005 0839   HDL 51.30 09/29/2015 0916   CHOLHDL 3 09/29/2015 0916   VLDL 19.0 09/29/2015 0916   LDLCALC 63 09/29/2015 0916   LDLDIRECT 104.9 06/09/2009 0919     Wt Readings from Last 3 Encounters:  05/13/17 259 lb 12.8 oz (117.8 kg)  04/17/17 267 lb (121.1 kg)  02/15/17 258 lb (117 kg)      Other studies Reviewed: Additional  studies/ records that were reviewed today include: TTE 11/10/15 Review of the above records today demonstrates:  - Left ventricle: The cavity size was normal. Wall thickness was   increased in a pattern of mild LVH. The estimated ejection   fraction was 55%. Wall motion was normal; there were no regional   wall motion abnormalities. Doppler parameters are consistent with   abnormal left ventricular relaxation (grade 1 diastolic   dysfunction). - Aortic valve: There was no stenosis. - Mitral valve: There was trivial regurgitation. - Left atrium: The atrium was mildly dilated. - Right ventricle: The cavity size was normal. Pacer wire or   catheter noted in right ventricle. Systolic function was normal. - Pulmonary arteries: No complete TR doppler jet so unable to   estimate PA systolic pressure. - Inferior vena cava: The vessel was normal in size. The   respirophasic diameter changes were in the normal range (= 50%),   consistent with normal central venous pressure. - Pericardium, extracardiac: A trivial pericardial effusion was   identified posterior to the heart.  Myoview 06/04/14 Low risk stress nuclear study with a small sized, mild intensity reversible defect concering for mild apical-inferior/inferoseptal ischeima.  Clinical Correlation is  recommended.  ASSESSMENT AND PLAN:  1.  Persistent atrial fibrillation: Currently on Xarelto and Tikosyn. Went back into AF and now plans for ablation 05/17/17. Risks and benefits discussed and include bleeding tamponade, heart block, stroke, and damage to surrounding organs.  The patient understands these risks and is agreed to the procedure.  This patients CHA2DS2-VASc Score and unadjusted Ischemic Stroke Rate (% per year) is equal to 2.2 % stroke rate/year from a score of 2  Above score calculated as 1 point each if present [CHF, HTN, DM, Vascular=MI/PAD/Aortic Plaque, Age if 65-74, or Male] Above score calculated as 2 points each if present [Age > 75, or Stroke/TIA/TE]   2. Hypertension: Well-controlled today  3.  Sinus node dysfunction: Status post Saint Jude dual-chamber pacemaker functioning appropriately.  No changes.  4. Shortness of breath: Likely due to atrial fibrillation.  Plan for ablation.  Current medicines are reviewed at length with the patient today.   The patient does not have concerns regarding his medicines.  The following changes were made today: None  Labs/ tests ordered today include:  No orders of the defined types were placed in this encounter.    Disposition:   FU with Tori Cupps 3 months  Signed, Franck Vinal [name], MD  05/13/2017 1:06 PM     [place] [address] [phone] (office) [phone]

## 2017-05-14 ENCOUNTER — Ambulatory Visit (HOSPITAL_COMMUNITY)
Admission: RE | Admit: 2017-05-14 | Discharge: 2017-05-14 | Disposition: A | Discharge status: Home / Self Care | Payer: Medicare Other | Source: Ambulatory Visit | Attending: Cardiology | Admitting: Cardiology

## 2017-05-14 ENCOUNTER — Ambulatory Visit (HOSPITAL_COMMUNITY): Admission: RE | Admit: 2017-05-14 | Payer: Medicare Other | Source: Ambulatory Visit

## 2017-05-14 DIAGNOSIS — I481 Persistent atrial fibrillation: Secondary | ICD-10-CM | POA: Diagnosis not present

## 2017-05-14 DIAGNOSIS — I4819 Other persistent atrial fibrillation: Secondary | ICD-10-CM

## 2017-05-14 DIAGNOSIS — I4891 Unspecified atrial fibrillation: Secondary | ICD-10-CM | POA: Diagnosis not present

## 2017-05-14 LAB — BASIC METABOLIC PANEL
BUN/Creatinine Ratio: 16 (ref 10–24)
BUN: 25 mg/dL (ref 8–27)
CALCIUM: 9.5 mg/dL (ref 8.6–10.2)
CO2: 23 mmol/L (ref 20–29)
CREATININE: 1.57 mg/dL — AB (ref 0.76–1.27)
Chloride: 104 mmol/L (ref 96–106)
GFR, EST AFRICAN AMERICAN: 51 mL/min/{1.73_m2} — AB (ref >59)
GFR, EST NON AFRICAN AMERICAN: 44 mL/min/{1.73_m2} — AB (ref >59)
Glucose: 101 mg/dL — ABNORMAL HIGH (ref 65–99)
Potassium: 4 mmol/L (ref 3.5–5.2)
Sodium: 145 mmol/L — ABNORMAL HIGH (ref 134–144)

## 2017-05-14 LAB — CBC WITH DIFFERENTIAL/PLATELET
BASOS: 1 %
Basophils Absolute: 0 10*3/uL (ref 0.0–0.2)
EOS (ABSOLUTE): 0.3 10*3/uL (ref 0.0–0.4)
EOS: 4 %
HEMOGLOBIN: 15.6 g/dL (ref 13.0–17.7)
Hematocrit: 46.9 % (ref 37.5–51.0)
IMMATURE GRANULOCYTES: 0 %
Immature Grans (Abs): 0 10*3/uL (ref 0.0–0.1)
LYMPHS ABS: 1.5 10*3/uL (ref 0.7–3.1)
Lymphs: 23 %
MCH: 29.2 pg (ref 26.6–33.0)
MCHC: 33.3 g/dL (ref 31.5–35.7)
MCV: 88 fL (ref 79–97)
MONOCYTES: 12 %
Monocytes Absolute: 0.8 10*3/uL (ref 0.1–0.9)
NEUTROS PCT: 60 %
Neutrophils Absolute: 3.9 10*3/uL (ref 1.4–7.0)
Platelets: 236 10*3/uL (ref 150–379)
RBC: 5.35 x10E6/uL (ref 4.14–5.80)
RDW: 15.5 % — ABNORMAL HIGH (ref 12.3–15.4)
WBC: 6.6 10*3/uL (ref 3.4–10.8)

## 2017-05-14 MED ORDER — NITROGLYCERIN 0.4 MG SL SUBL
SUBLINGUAL_TABLET | SUBLINGUAL | Status: AC
  Administered 2017-05-14: 0.4 mg
  Filled 2017-05-14: qty 1

## 2017-05-14 MED ORDER — IOPAMIDOL (ISOVUE-370) INJECTION 76%
INTRAVENOUS | Status: AC
  Administered 2017-05-14: 80 mL
  Filled 2017-05-14: qty 100

## 2017-05-17 ENCOUNTER — Ambulatory Visit (HOSPITAL_COMMUNITY): Payer: Medicare Other | Admitting: Anesthesiology

## 2017-05-17 ENCOUNTER — Encounter (HOSPITAL_COMMUNITY): Payer: Self-pay | Admitting: Cardiology

## 2017-05-17 ENCOUNTER — Other Ambulatory Visit: Payer: Self-pay

## 2017-05-17 ENCOUNTER — Ambulatory Visit (HOSPITAL_COMMUNITY)
Admission: RE | Admit: 2017-05-17 | Discharge: 2017-05-18 | Disposition: A | Discharge status: Home / Self Care | Payer: Medicare Other | Source: Ambulatory Visit | Attending: Cardiology | Admitting: Cardiology

## 2017-05-17 ENCOUNTER — Encounter (HOSPITAL_COMMUNITY)
Admission: RE | Disposition: A | Discharge status: Home / Self Care | Payer: Self-pay | Source: Ambulatory Visit | Attending: Cardiology

## 2017-05-17 DIAGNOSIS — E039 Hypothyroidism, unspecified: Secondary | ICD-10-CM | POA: Diagnosis present

## 2017-05-17 DIAGNOSIS — Z6841 Body Mass Index (BMI) 40.0 and over, adult: Secondary | ICD-10-CM | POA: Insufficient documentation

## 2017-05-17 DIAGNOSIS — I739 Peripheral vascular disease, unspecified: Secondary | ICD-10-CM

## 2017-05-17 DIAGNOSIS — I481 Persistent atrial fibrillation: Secondary | ICD-10-CM | POA: Diagnosis not present

## 2017-05-17 DIAGNOSIS — E669 Obesity, unspecified: Secondary | ICD-10-CM | POA: Diagnosis not present

## 2017-05-17 DIAGNOSIS — I4892 Unspecified atrial flutter: Secondary | ICD-10-CM | POA: Diagnosis not present

## 2017-05-17 DIAGNOSIS — I119 Hypertensive heart disease without heart failure: Secondary | ICD-10-CM | POA: Diagnosis not present

## 2017-05-17 DIAGNOSIS — I480 Paroxysmal atrial fibrillation: Secondary | ICD-10-CM | POA: Diagnosis not present

## 2017-05-17 DIAGNOSIS — E785 Hyperlipidemia, unspecified: Secondary | ICD-10-CM | POA: Diagnosis not present

## 2017-05-17 DIAGNOSIS — I4891 Unspecified atrial fibrillation: Secondary | ICD-10-CM | POA: Diagnosis not present

## 2017-05-17 DIAGNOSIS — I2510 Atherosclerotic heart disease of native coronary artery without angina pectoris: Secondary | ICD-10-CM | POA: Diagnosis present

## 2017-05-17 DIAGNOSIS — I779 Disorder of arteries and arterioles, unspecified: Secondary | ICD-10-CM | POA: Diagnosis present

## 2017-05-17 DIAGNOSIS — I10 Essential (primary) hypertension: Secondary | ICD-10-CM | POA: Diagnosis not present

## 2017-05-17 HISTORY — DX: Personal history of urinary calculi: Z87.442

## 2017-05-17 HISTORY — DX: Sleep apnea, unspecified: G47.30

## 2017-05-17 HISTORY — PX: ATRIAL FIBRILLATION ABLATION: EP1191

## 2017-05-17 HISTORY — DX: Presence of cardiac pacemaker: Z95.0

## 2017-05-17 HISTORY — DX: Panic disorder (episodic paroxysmal anxiety): F41.0

## 2017-05-17 HISTORY — DX: Diverticulitis of intestine, part unspecified, without perforation or abscess without bleeding: K57.92

## 2017-05-17 HISTORY — DX: Anxiety disorder, unspecified: F41.9

## 2017-05-17 LAB — POCT ACTIVATED CLOTTING TIME: Activated Clotting Time: 180 seconds

## 2017-05-17 SURGERY — ATRIAL FIBRILLATION ABLATION
Anesthesia: General

## 2017-05-17 MED ORDER — PHENYLEPHRINE HCL 10 MG/ML IJ SOLN
INTRAVENOUS | Status: DC | PRN
  Administered 2017-05-17: 30 ug/min via INTRAVENOUS

## 2017-05-17 MED ORDER — LIDOCAINE HCL (CARDIAC) 20 MG/ML IV SOLN
INTRAVENOUS | Status: DC | PRN
  Administered 2017-05-17: 60 mg via INTRAVENOUS

## 2017-05-17 MED ORDER — OFF THE BEAT BOOK
Freq: Once | Status: AC
  Administered 2017-05-17: 23:00:00
  Filled 2017-05-17: qty 1

## 2017-05-17 MED ORDER — HEPARIN SODIUM (PORCINE) 1000 UNIT/ML IJ SOLN
INTRAMUSCULAR | Status: AC
  Filled 2017-05-17: qty 1

## 2017-05-17 MED ORDER — ONDANSETRON HCL 4 MG/2ML IJ SOLN: 4.0000 mg | Freq: Four times a day (QID) | INTRAMUSCULAR | Status: DC | PRN

## 2017-05-17 MED ORDER — ACETAMINOPHEN 500 MG PO TABS: 1000.0000 mg | ORAL_TABLET | Freq: Four times a day (QID) | ORAL | Status: DC | PRN

## 2017-05-17 MED ORDER — TAMSULOSIN HCL 0.4 MG PO CAPS
0.4000 mg | ORAL_CAPSULE | Freq: Every day | ORAL | Status: DC
  Administered 2017-05-17: 18:00:00 0.4 mg via ORAL
  Filled 2017-05-17: qty 1

## 2017-05-17 MED ORDER — SODIUM CHLORIDE 0.9% FLUSH
3.0000 mL | Freq: Two times a day (BID) | INTRAVENOUS | Status: DC
  Administered 2017-05-17: 3 mL via INTRAVENOUS

## 2017-05-17 MED ORDER — HYDROCHLOROTHIAZIDE 25 MG PO TABS
25.0000 mg | ORAL_TABLET | Freq: Every day | ORAL | Status: DC
  Administered 2017-05-17 – 2017-05-18 (×2): 25 mg via ORAL
  Filled 2017-05-17 (×2): qty 1

## 2017-05-17 MED ORDER — LOSARTAN POTASSIUM-HCTZ 100-25 MG PO TABS: 1.0000 | ORAL_TABLET | Freq: Every day | ORAL | Status: DC

## 2017-05-17 MED ORDER — PROTAMINE SULFATE 10 MG/ML IV SOLN
INTRAVENOUS | Status: DC | PRN
  Administered 2017-05-17: 40 mg via INTRAVENOUS

## 2017-05-17 MED ORDER — SIMVASTATIN 20 MG PO TABS
10.0000 mg | ORAL_TABLET | Freq: Every day | ORAL | Status: DC
  Administered 2017-05-17: 10 mg via ORAL
  Filled 2017-05-17: qty 1

## 2017-05-17 MED ORDER — HEPARIN (PORCINE) IN NACL 2-0.9 UNIT/ML-% IJ SOLN
INTRAMUSCULAR | Status: AC
  Filled 2017-05-17: qty 500

## 2017-05-17 MED ORDER — DILTIAZEM HCL ER COATED BEADS 300 MG PO CP24
300.0000 mg | ORAL_CAPSULE | Freq: Every day | ORAL | Status: DC
  Administered 2017-05-17 – 2017-05-18 (×2): 300 mg via ORAL
  Filled 2017-05-17 (×2): qty 1

## 2017-05-17 MED ORDER — FENTANYL CITRATE (PF) 100 MCG/2ML IJ SOLN
INTRAMUSCULAR | Status: DC | PRN
  Administered 2017-05-17: 100 ug via INTRAVENOUS

## 2017-05-17 MED ORDER — HEPARIN (PORCINE) IN NACL 2-0.9 UNIT/ML-% IJ SOLN
INTRAMUSCULAR | Status: AC | PRN
  Administered 2017-05-17: 2500 mL

## 2017-05-17 MED ORDER — BUPIVACAINE HCL (PF) 0.25 % IJ SOLN
INTRAMUSCULAR | Status: AC
  Filled 2017-05-17: qty 30

## 2017-05-17 MED ORDER — SODIUM CHLORIDE 0.9 % IV SOLN: 250.0000 mL | INTRAVENOUS | Status: DC | PRN

## 2017-05-17 MED ORDER — IBUPROFEN 200 MG PO TABS
200.0000 mg | ORAL_TABLET | Freq: Three times a day (TID) | ORAL | Status: DC | PRN
  Administered 2017-05-17: 14:00:00 200 mg via ORAL
  Filled 2017-05-17: qty 1

## 2017-05-17 MED ORDER — ONDANSETRON HCL 4 MG/2ML IJ SOLN
INTRAMUSCULAR | Status: DC | PRN
  Administered 2017-05-17: 4 mg via INTRAVENOUS

## 2017-05-17 MED ORDER — SUGAMMADEX SODIUM 200 MG/2ML IV SOLN
INTRAVENOUS | Status: DC | PRN
  Administered 2017-05-17: 200 mg via INTRAVENOUS

## 2017-05-17 MED ORDER — ADENOSINE 6 MG/2ML IV SOLN
INTRAVENOUS | Status: AC
  Filled 2017-05-17: qty 2

## 2017-05-17 MED ORDER — PROPOFOL 10 MG/ML IV BOLUS
INTRAVENOUS | Status: DC | PRN
  Administered 2017-05-17: 50 mg via INTRAVENOUS
  Administered 2017-05-17: 150 mg via INTRAVENOUS

## 2017-05-17 MED ORDER — DEXAMETHASONE SODIUM PHOSPHATE 10 MG/ML IJ SOLN
INTRAMUSCULAR | Status: DC | PRN
  Administered 2017-05-17: 10 mg via INTRAVENOUS

## 2017-05-17 MED ORDER — LEVOTHYROXINE SODIUM 50 MCG PO TABS
50.0000 ug | ORAL_TABLET | Freq: Every day | ORAL | Status: DC
  Administered 2017-05-17: 50 ug via ORAL
  Filled 2017-05-17 (×3): qty 1

## 2017-05-17 MED ORDER — SODIUM CHLORIDE 0.9 % IV SOLN
INTRAVENOUS | Status: DC | PRN
  Administered 2017-05-17 (×2): via INTRAVENOUS

## 2017-05-17 MED ORDER — ACETAMINOPHEN 325 MG PO TABS: 650.0000 mg | ORAL_TABLET | ORAL | Status: DC | PRN

## 2017-05-17 MED ORDER — MIDAZOLAM HCL 5 MG/5ML IJ SOLN
INTRAMUSCULAR | Status: DC | PRN
  Administered 2017-05-17: 2 mg via INTRAVENOUS

## 2017-05-17 MED ORDER — BUPIVACAINE HCL (PF) 0.25 % IJ SOLN
INTRAMUSCULAR | Status: DC | PRN
  Administered 2017-05-17: 20 mL

## 2017-05-17 MED ORDER — FLUTICASONE PROPIONATE 50 MCG/ACT NA SUSP
2.0000 | NASAL | Status: DC | PRN
  Filled 2017-05-17: qty 16

## 2017-05-17 MED ORDER — LOSARTAN POTASSIUM 50 MG PO TABS
100.0000 mg | ORAL_TABLET | Freq: Every day | ORAL | Status: DC
  Administered 2017-05-17 – 2017-05-18 (×2): 100 mg via ORAL
  Filled 2017-05-17 (×2): qty 2

## 2017-05-17 MED ORDER — DOBUTAMINE IN D5W 4-5 MG/ML-% IV SOLN
INTRAVENOUS | Status: DC | PRN
  Administered 2017-05-17: 20 ug/kg/min via INTRAVENOUS

## 2017-05-17 MED ORDER — SODIUM CHLORIDE 0.9% FLUSH: 3.0000 mL | INTRAVENOUS | Status: DC | PRN

## 2017-05-17 MED ORDER — DILTIAZEM HCL 30 MG PO TABS
30.0000 mg | ORAL_TABLET | ORAL | Status: DC | PRN
  Filled 2017-05-17: qty 1

## 2017-05-17 MED ORDER — ADENOSINE 6 MG/2ML IV SOLN
INTRAVENOUS | Status: DC | PRN
  Administered 2017-05-17: 12 mg via INTRAVENOUS

## 2017-05-17 MED ORDER — RIVAROXABAN 20 MG PO TABS
20.0000 mg | ORAL_TABLET | Freq: Every day | ORAL | Status: DC
  Administered 2017-05-17: 18:00:00 20 mg via ORAL
  Filled 2017-05-17: qty 1

## 2017-05-17 MED ORDER — HEPARIN SODIUM (PORCINE) 1000 UNIT/ML IJ SOLN
INTRAMUSCULAR | Status: DC | PRN
  Administered 2017-05-17: 3000 [IU] via INTRAVENOUS
  Administered 2017-05-17: 2000 [IU] via INTRAVENOUS
  Administered 2017-05-17: 15000 [IU] via INTRAVENOUS

## 2017-05-17 SURGICAL SUPPLY — 19 items
BLANKET WARM UNDERBOD FULL ACC (MISCELLANEOUS) ×3 IMPLANT
CATH MAPPNG PENTARAY F 2-6-2MM (CATHETERS) ×1 IMPLANT
CATH SMTCH THERMOCOOL SF DF (CATHETERS) ×3 IMPLANT
CATH SOUNDSTAR ECO REPROCESSED (CATHETERS) ×3 IMPLANT
CATH WEBSTER BI DIR CS D-F CRV (CATHETERS) ×3 IMPLANT
COVER SWIFTLINK CONNECTOR (BAG) ×3 IMPLANT
PACK EP LATEX FREE (CUSTOM PROCEDURE TRAY) ×2
PACK EP LF (CUSTOM PROCEDURE TRAY) ×1 IMPLANT
PAD DEFIB LIFELINK (PAD) ×3 IMPLANT
PATCH CARTO3 (PAD) ×3 IMPLANT
PENTARAY F 2-6-2MM (CATHETERS) ×3
SHEATH AVANTI 11CM 7FR (SHEATH) ×3 IMPLANT
SHEATH AVANTI 11CM 8FR (SHEATH) ×6 IMPLANT
SHEATH AVANTI 11CM 9FR (SHEATH) ×6 IMPLANT
SHEATH AVANTI 11F 11CM (SHEATH) ×3 IMPLANT
SHEATH BAYLIS SUREFLEX  M 8.5 (SHEATH) ×4
SHEATH BAYLIS SUREFLEX M 8.5 (SHEATH) ×2 IMPLANT
SHEATH BAYLIS TRANSSEPTAL 98CM (NEEDLE) ×3 IMPLANT
TUBING SMART ABLATE COOLFLOW (TUBING) ×3 IMPLANT

## 2017-05-17 NOTE — Transfer of Care (Signed)
Immediate Anesthesia Transfer of Care Note  Patient: Erik [name]  Procedure(s) Performed: ATRIAL FIBRILLATION ABLATION (N/A )  Patient Location: PACU  Anesthesia Type:General  Level of Consciousness: awake, alert , oriented and patient cooperative  Airway & Oxygen Therapy: Patient Spontanous Breathing and Patient connected to nasal cannula oxygen  Post-op Assessment: Report given to RN and Post -op Vital signs reviewed and stable  Post vital signs: Reviewed and stable  Last Vitals:  Vitals Value Taken Time  BP 99/41 05/17/2017 11:10 AM  Temp 36.3 C 05/17/2017 11:08 AM  Pulse 66 05/17/2017 11:15 AM  Resp 10 05/17/2017 11:15 AM  SpO2 96 % 05/17/2017 11:15 AM  Vitals shown include unvalidated device data.  Last Pain:  Vitals:   05/17/17 1108  TempSrc: Temporal  PainSc: 10-Worst pain ever         Complications: No apparent anesthesia complications

## 2017-05-17 NOTE — Progress Notes (Signed)
Site Area: Left Femoral Vein x 2 Site prior to removal: Level 0 Manual: yes Patient status during pull: Stable Post pull site: Level 0 Post instruction given: yes Post pull pulses: Present Dressing: Tegaderm

## 2017-05-17 NOTE — Anesthesia Preprocedure Evaluation (Signed)
Anesthesia Evaluation  Patient identified by MRN, date of birth, ID band Patient awake    Reviewed: Allergy & Precautions, H&P , NPO status , Patient's Chart, lab work & pertinent test results  Airway Mallampati: II   Neck ROM: full    Dental   Pulmonary former smoker,    breath sounds clear to auscultation       Cardiovascular hypertension, + Peripheral Vascular Disease  + dysrhythmias Atrial Fibrillation  Rhythm:irregular Rate:Normal     Neuro/Psych    GI/Hepatic   Endo/Other  Hypothyroidism   Renal/GU Renal InsufficiencyRenal disease     Musculoskeletal   Abdominal   Peds  Hematology   Anesthesia Other Findings   Reproductive/Obstetrics                             Anesthesia Physical Anesthesia Plan  ASA: III  Anesthesia Plan: General   Post-op Pain Management:    Induction: Intravenous  PONV Risk Score and Plan: 2 and Ondansetron, Treatment may vary due to age or medical condition and Dexamethasone  Airway Management Planned: LMA  Additional Equipment:   Intra-op Plan:   Post-operative Plan:   Informed Consent: I have reviewed the patients History and Physical, chart, labs and discussed the procedure including the risks, benefits and alternatives for the proposed anesthesia with the patient or authorized representative who has indicated his/her understanding and acceptance.     Plan Discussed with: CRNA and Anesthesiologist  Anesthesia Plan Comments:         Anesthesia Quick Evaluation

## 2017-05-17 NOTE — Progress Notes (Signed)
Site area: RFV x 2 Site Prior to Removal:  Level 0 Pressure Applied For:23 min Manual:  yes  Patient Status During Pull:  stable Post Pull Site:  Level 0 Post Pull Instructions Given:  yes Post Pull Pulses Present: palpable Dressing Applied:  tegaderm  Comments:

## 2017-05-17 NOTE — Discharge Instructions (Signed)
Post procedure care instructions No driving for 4 days. No lifting over 5 lbs for 1 week. No vigorous or sexual activity for 1 week. You may return to work in one week. Keep procedure site clean & dry. If you notice increased pain, swelling, bleeding or pus, call/return!  You may shower, but no soaking baths/hot tubs/pools for 1 week.      You have an appointment set up with the [facility].  Multiple studies have shown that being followed by a dedicated atrial fibrillation clinic in addition to the standard care you receive from your other physicians improves health. We believe that enrollment in the atrial fibrillation clinic will allow [place] to better care for you.   The phone number to the [facility] is [phone]. The clinic is staffed Monday through Friday from 8:30am to 5pm.  Parking Directions: The clinic is located in the Heart and Vascular Building connected to [facility]. 1)From [address] turn on to [address] and go to the 3rd entrance  (Heart and Vascular entrance) on the right. 2)Look to the right for Heart &Vascular Parking Garage. 3)A code for the entrance is required please call the clinic to receive this.   4)Take the elevators to the 1st floor. Registration is in the room with the glass walls at the end of the hallway.  If you have any trouble parking or locating the clinic, please dont hesitate to call [phone].

## 2017-05-17 NOTE — Anesthesia Postprocedure Evaluation (Signed)
Anesthesia Post Note  Patient: Erik [name]  Procedure(s) Performed: ATRIAL FIBRILLATION ABLATION (N/A )     Patient location during evaluation: PACU Anesthesia Type: General Level of consciousness: awake and alert Pain management: pain level controlled Vital Signs Assessment: post-procedure vital signs reviewed and stable Respiratory status: spontaneous breathing, nonlabored ventilation and respiratory function stable Cardiovascular status: blood pressure returned to baseline and stable Postop Assessment: no apparent nausea or vomiting Anesthetic complications: no   Vitals:   05/17/17 1150 05/17/17 1155  BP: (!) 113/56 (!) 112/47  Pulse: (!) 59 (!) 59  Resp: (!) 9 (!) 8  Temp:    SpO2: 98% 96%      Last Pain:  Vitals:   05/17/17 1130  TempSrc: Temporal  PainSc:                  [name]

## 2017-05-17 NOTE — H&P (Signed)
Erik [name] has presented today for surgery, with the diagnosis of atrial fibrillation.  The various methods of treatment have been discussed with the patient and family. After consideration of risks, benefits and other options for treatment, the patient has consented to  Procedure(s): Catheter ablation as a surgical intervention .  Risks include but not limited to bleeding, tamponade, heart block, stroke, damage to surrounding organs, among others. The patient's history has been reviewed, patient examined, no change in status, stable for surgery.  I have reviewed the patient's chart and labs.  Questions were answered to the patient's satisfaction.    Will Curt Bears, MD 05/17/2017 7:11 AM

## 2017-05-18 DIAGNOSIS — I480 Paroxysmal atrial fibrillation: Secondary | ICD-10-CM

## 2017-05-18 DIAGNOSIS — Z6841 Body Mass Index (BMI) 40.0 and over, adult: Secondary | ICD-10-CM | POA: Diagnosis not present

## 2017-05-18 DIAGNOSIS — E669 Obesity, unspecified: Secondary | ICD-10-CM | POA: Diagnosis not present

## 2017-05-18 DIAGNOSIS — I2510 Atherosclerotic heart disease of native coronary artery without angina pectoris: Secondary | ICD-10-CM | POA: Diagnosis not present

## 2017-05-18 DIAGNOSIS — I119 Hypertensive heart disease without heart failure: Secondary | ICD-10-CM | POA: Diagnosis not present

## 2017-05-18 DIAGNOSIS — E039 Hypothyroidism, unspecified: Secondary | ICD-10-CM | POA: Diagnosis not present

## 2017-05-18 DIAGNOSIS — E785 Hyperlipidemia, unspecified: Secondary | ICD-10-CM | POA: Diagnosis not present

## 2017-05-18 DIAGNOSIS — I481 Persistent atrial fibrillation: Secondary | ICD-10-CM | POA: Diagnosis not present

## 2017-05-18 DIAGNOSIS — I4892 Unspecified atrial flutter: Secondary | ICD-10-CM | POA: Diagnosis not present

## 2017-05-18 LAB — LIPID PANEL
CHOL/HDL RATIO: 3.7 ratio
CHOLESTEROL: 136 mg/dL (ref 0–200)
HDL: 37 mg/dL — ABNORMAL LOW (ref >40)
LDL Cholesterol: 82 mg/dL (ref 0–99)
TRIGLYCERIDES: 85 mg/dL (ref <150)
VLDL: 17 mg/dL (ref 0–40)

## 2017-05-18 MED ORDER — SIMVASTATIN 20 MG PO TABS: 20.0000 mg | ORAL_TABLET | Freq: Every day | ORAL | 3 refills | Status: DC

## 2017-05-18 NOTE — Discharge Summary (Addendum)
Discharge Summary    Patient ID: Erik [name],  MRN: [id number], DOB/AGE: [birth year] 70 y.o.  Admit date: 05/17/2017 Discharge date: 05/18/2017  Primary Care Provider: [name] Cardiologist:  [name] Primary Electrophysiologist:  Constance Haw, MD         Discharge Diagnoses    Principal Problem:   Paroxysmal A-fib [facility]) Active Problems:   Hyperlipidemia   OBESITY   Hypertensive heart disease   Hypothyroidism   Non-obstructive CAD   Carotid disease, bilateral ([place])   Allergies No Known Allergies  Diagnostic Studies/Procedures    ATRIAL FIBRILLATION ABLATION  05/17/17  PROCEDURES: 1. Comprehensive electrophysiologic study. 2. Coronary sinus pacing and recording. 3. Three-dimensional mapping of atrial fibrillation with additional mapping and ablation of a second discrete focus (atrial flutter) 4. Ablation of atrial fibrillation with additional mapping and ablation of a second discrete focus (atrial flutter) 5. Intracardiac echocardiography. 6. Transseptal puncture of an intact septum. 7. Arrhythmia induction with pacing with isuprel infusion  INTRODUCTION:  Erik [name] is a 72 y.o. male with a history of paroxysmal atrial fibrillation and typical appearing atrial flutter who now presents for EP study and radiofrequency ablation.  The patient reports initially being diagnosed with atrial fibrillation after presenting with symptomatic palpitations and fatgiue. The patient reports increasing frequency and duration of atrial arrhythmias since that time.  The patient therefore presents today for catheter ablation of atrial fibrillation and atrial flutter.  DESCRIPTION OF PROCEDURE:  Informed written consent was obtained, and the patient was brought to the electrophysiology lab in a fasting state.  The patient was adequately sedated with intravenous medications as outlined in the anesthesia report.  The patient's left and right groins were prepped and draped  in the usual sterile fashion by the EP lab staff.  Using a percutaneous Seldinger technique, two 8-French hemostasis sheaths were placed in the right common femoral vein; one 7-French and one 11-French hemostasis sheaths were placed into the left common femoral vein.    Catheter Placement:  A 7-French Biosense Webster Decapolar coronary sinus catheter was introduced through the right common femoral vein and advanced into the coronary sinus for recording and pacing from this location.    Initial Measurements: The patient presented to the electrophysiology lab in sinus rhythm.  The patients PR interval measured 185 msec with a QRS duration of 90 msec and a QT interval of 360 msec.    Intracardiac Echocardiography: A 10-French Biosense Webster AcuNav intracardiac echocardiography catheter was introduced through the right common femoral vein and advanced into the right atrium. Intracardiac echocardiography was performed of the left atrium, and a three-dimensional anatomical rendering of the left atrium was performed using CARTO sound technology.  The patient was noted to have a moderate sized left atrium.  The interatrial septum was prominent but not aneurysmal. All 4 pulmonary veins were visualized and noted to have separate ostia.  The pulmonary veins were moderate in size.  The left atrial appendage was visualized and did not reveal thrombus.   There was no evidence of pulmonary vein stenosis.   Transseptal Puncture: The right common femoral vein sheaths were was exchanged for an 8.5 [place] Agillis transseptal sheath and transseptal access was achieved in a standard fashion using a Brockenbrough needle under biplane fluoroscopy with intracardiac echocardiography confirmation of the transseptal puncture.  Once transseptal access had been achieved, heparin was administered intravenously and intra- arterially in order to maintain an ACT of greater than 350 seconds throughout the procedure.  3D  Mapping and Ablation: A 3.5 mm Biosense Webster SmartTouch Thermocool ablation catheter was advanced into the right atrium.  The transseptal sheath was pulled back into the IVC over a guidewire.  The ablation catheter was advanced across the transseptal hole using the wire as a guide.  The transseptal sheath was then re-advanced over the guidewire into the left atrium.  A duodecapolar Biosense Webster circular mapping catheter was introduced through the transseptal sheath and positioned over the mouth of all 4 pulmonary veins.  Three-dimensional electroanatomical mapping was performed using CARTO technology.  This demonstrated electrical activity within all four pulmonary veins at baseline. The patient underwent successful sequential electrical isolation and anatomical encircling of all four pulmonary veins using radiofrequency current with a circular mapping catheter as a guide.  A WACA approach was used.  Entrance and exit block were achieved.    Measurements Following Ablation: Following ablation, Isuprel was infused up to 20 mcg/min with no inducible atrial fibrillation, atrial tachycardia, atrial flutter, or sustained PACs. In sinus rhythm with RR interval was 903 msec, with PR 158 msec, QRS 90 msec, and Qt 371 msec.  Following ablation the AH interval measured 73 msec with an HV interval of 42 msec. Due to the pacemaker, no AV or VA Wenckebach was tested.  Intracardiac echocardiography was again performed, which revealed no pericardial effusion.  The procedure was therefore considered completed.  All catheters were removed, and the sheaths were aspirated and flushed.  The patient was transferred to the recovery area for sheath removal per protocol.  A limited bedside transthoracic echocardiogram revealed no pericardial effusion. EBL<69ml.  There were no early apparent complications.  CONCLUSIONS: 1. Sinus rhythm upon presentation.   2. Successful electrical isolation and anatomical encircling  of all four pulmonary veins with radiofrequency current.    3. No inducible arrhythmias following ablation both on and off of dobutamine 4. No early apparent complications.   History of Present Illness     Erik [name] is a 72 y.o. male  with history of nonobstructive coronary artery disease, hypertension, hyperlipidemia, hypothyroidism and PAF presentation for afib ablation.   In March 2016, he was diagnosed with atrial fibrillation with RVR in the setting of chest pain. He was loaded on tikosyn which was stopped as he went back in atrial fibrillation on 500 mcg twice a day..  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  Was noted to be back in AF and now plan for ablation on 05/17/17.  Hospital Course     Consultants: None  The patient underwent successful atrial fibrillation ablation. No complications. Maintained sinus rhythm overnight. No chest pain or dyspnea. Zocor increased to 20mg  qd for elevated LDL.  05/18/2017: Cholesterol 136; HDL 37; LDL Cholesterol 82; Triglycerides 85; VLDL 17  The patient has been seen by Dr. Curt Bears today and deemed ready for discharge home. All follow-up appointments have been scheduled. Discharge medications are listed below.    Discharge Vitals Blood pressure 128/61, pulse 66, temperature (!) 97.4 F (36.3 C), temperature source Oral, resp. rate 14, height 5\' 9"  (1.753 m), weight 271 lb 2.7 oz (123 kg), SpO2 93 %.  Filed Weights   05/17/17 0543 05/18/17 0351  Weight: 255 lb (115.7 kg) 271 lb 2.7 oz (123 kg)   Physical Exam  Constitutional: He is oriented to person, place, and time. He appears well-developed and well-nourished.  HENT:  Head: Normocephalic and atraumatic.  Eyes: Pupils are equal, round, and reactive to light.  EOM are normal.  Neck: Normal range of motion. Neck supple.  Cardiovascular: Normal rate and regular rhythm.  Access site stable  Respiratory: Effort normal and breath sounds normal.  GI:  Soft. Bowel sounds are normal.  Musculoskeletal: Normal range of motion.  Neurological: He is alert and oriented to person, place, and time.  Skin: Skin is warm and dry.  Psychiatric: He has a normal mood and affect.   Labs & Radiologic Studies    Fasting Lipid Panel Recent Labs    05/18/17 0514  CHOL 136  HDL 37*  LDLCALC 82  TRIG 85  CHOLHDL 3.7   Ct Cardiac Morph/pulm Vein W/cm&w/o Ca Score  Addendum Date: 05/14/2017   ADDENDUM REPORT: 05/14/2017 11:06 EXAM: OVER-READ INTERPRETATION  CT CHEST The following report is an over-read performed by radiologist Dr. Luana Shu [facility] on 05/14/2017. This over-read does not include interpretation of cardiac or coronary anatomy or pathology. The coronary CTA interpretation by the cardiologist is attached. COMPARISON:  None. FINDINGS: No suspicious pulmonary nodules. Mild scarring/atelectasis in the medial right middle lobe. No focal consolidation. No pleural effusion or pneumothorax. The heart is normal in size. No pericardial effusion. Left subclavian pacemaker. No evidence of thoracic aortic aneurysm. Atherosclerotic calcifications of the aortic arch. Three vessel coronary atherosclerosis. No suspicious mediastinal lymphadenopathy. Visualized soft tissues are unremarkable. Liver is mildly macronodular. Mild degenerative changes the visualized thoracic spine. IMPRESSION: No significant extracardiac findings. Electronically Signed   By: Julian Hy M.D.   On: 05/14/2017 11:06   Result Date: 05/14/2017 CLINICAL DATA:  72 year old male with persistent atrial fibrillation scheduled for an ablation. EXAM: Cardiac CT/CTA TECHNIQUE: The patient was scanned on a Siemens Somatom scanner. FINDINGS: A 120 kV prospective scan was triggered in the descending thoracic aorta at 111 HU's. Gantry rotation speed was 280 msecs and collimation was .9 mm. No beta blockade and no NTG was given. The 3D data set was reconstructed in 5% intervals  of the 60-80 % of the R-R cycle. Diastolic phases were analyzed on a dedicated work station using MPR, MIP and VRT modes. The patient received 80 cc of contrast. There is normal pulmonary vein drainage into the left atrium (2 on the right and 2 on the left) with ostial measurements as follows: RUPV: 18.3 x 16.9 mm RLPV: 21.6 x 18.4 mm Left common pulmonary trunk: 30.7 x 17.1 mm LUPV: 19.0 x 12.8 mm LLPV: 13.8 x 12.0 mm The left atrial appendage is fairly small with mixed chicken wing morphology and no thrombus. The esophagus runs to the left from the left atrial midline and is in the proximity to left common pulmonary trunk. Aorta:  Normal caliber.  No dissection or calcifications. Aortic Valve:  Trileaflet.  No calcifications. IMPRESSION: 1. There is normal pulmonary vein drainage into the left atrium. 2. The left atrial appendage is fairly small with mixed chicken wing morphology and no thrombus. 3. The esophagus runs to the left from the left atrial midline and is in the proximity to left common pulmonary trunk. 4. Normal coronary origin. Right dominance. The study was performed without use of NTG and insufficient for plaque evaluation. However, there are significant diffuse calcifications in all three coronary arteries, calcium score is 1757 that represents 92 percentile for age/sex. 5. Pulmonary artery is mildly dilated measuring 33 mm suggestive of pulmonary hypertension. Electronically Signed: By: Ena Dawley On: 05/14/2017 10:25    Disposition   Pt is being discharged home today in good condition.  Follow-up  Plans & Appointments    Follow-up Information    [facility] Follow up on 06/19/2017.   Specialty:  Cardiology Why:  9:30AM Contact information: [address] [id number] mc [address] [place] [address] [phone]       Constance Haw, MD Follow up on 08/20/2017.   Specialty:  Cardiology Why:  9:30AM Contact information: [address] [place] [address] [phone]          Discharge Instructions    Call MD for:  persistant dizziness or light-headedness   Complete by:  As directed    Call MD for:  severe uncontrolled pain   Complete by:  As directed    Diet - low sodium heart healthy   Complete by:  As directed    Increase activity slowly   Complete by:  As directed       Discharge Medications   Allergies as of 05/18/2017   No Known Allergies     Medication List    TAKE these medications   acetaminophen 500 MG tablet Commonly known as:  TYLENOL Take 1,000 mg by mouth every 6 (six) hours as needed for moderate pain or headache.   diltiazem 30 MG tablet Commonly known as:  CARDIZEM Cardizem 30mg  -- take 1 tablet every 4 hours AS NEEDED for heart rate >100 as long as blood pressure >100. What changed:    how much to take  how to take this  when to take this  reasons to take this  additional instructions   diltiazem 300 MG 24 hr capsule Commonly known as:  CARDIZEM CD Take 1 capsule (300 mg total) by mouth daily.   fluticasone 50 MCG/ACT nasal spray Commonly known as:  FLONASE Place 2 sprays into both nostrils as needed for allergies or rhinitis.   hydrocortisone 25 MG suppository Commonly known as:  ANUSOL-HC Place 1 suppository (25 mg total) rectally at bedtime as needed. What changed:  reasons to take this   ibuprofen 200 MG tablet Commonly known as:  ADVIL,MOTRIN Take 200 mg by mouth every 8 (eight) hours as needed for headache or moderate pain.   levothyroxine 50 MCG tablet Commonly known as:  SYNTHROID, LEVOTHROID TAKE 1 TABLET (50 MCG TOTAL) BY MOUTH DAILY.   losartan-hydrochlorothiazide 100-25 MG tablet Commonly known as:  HYZAAR TAKE 1 TABLET BY MOUTH EVERY DAY   rivaroxaban 20 MG Tabs tablet Commonly known as:  XARELTO Take 20 mg by mouth daily with supper.   simvastatin 20 MG tablet Commonly known as:  ZOCOR Take 1 tablet (20 mg total) by  mouth daily. What changed:  how much to take   tamsulosin 0.4 MG Caps capsule Commonly known as:  FLOMAX Take 1 capsule (0.4 mg total) by mouth daily.         Outstanding Labs/Studies   None  Duration of Discharge Encounter   Greater than 30 minutes including physician time.  Signed, Bhavinkumar Bhagat PA-C 05/18/2017, 8:16 AM   I have seen and examined this patient with Vin Bhagat.  Agree with above, note added to reflect my findings.  On exam, RRR, no murmurs, lungs clear.  Patient had atrial fibrillation ablation yesterday.  Had been having quite a bit of shortness of breath and weakness.  Is in sinus rhythm this morning.  No major complaints.  Plan for discharge today with follow-up in A. fib clinic.  Xavier Munger M. Delaina Fetsch MD 05/18/2017 8:54 AM

## 2017-05-20 LAB — POCT ACTIVATED CLOTTING TIME
ACTIVATED CLOTTING TIME: 356 s
Activated Clotting Time: 301 seconds
Activated Clotting Time: 312 seconds

## 2017-05-20 MED FILL — Heparin Sodium (Porcine) 2 Unit/ML in Sodium Chloride 0.9%: INTRAMUSCULAR | Qty: 2500 | Status: AC

## 2017-05-21 ENCOUNTER — Other Ambulatory Visit: Payer: Self-pay | Admitting: *Deleted

## 2017-05-21 MED ORDER — RIVAROXABAN 20 MG PO TABS: 20.0000 mg | ORAL_TABLET | Freq: Every day | ORAL | 1 refills | Status: DC

## 2017-05-25 ENCOUNTER — Other Ambulatory Visit: Payer: Self-pay | Admitting: Family Medicine

## 2017-05-28 NOTE — Telephone Encounter (Signed)
Last OV 02/15/2017   Last refilled 02/15/2017 disp 30 with 3 refills   Sent to PCP for approval

## 2017-06-19 ENCOUNTER — Ambulatory Visit (HOSPITAL_COMMUNITY)
Admission: RE | Admit: 2017-06-19 | Discharge: 2017-06-19 | Disposition: A | Discharge status: Home / Self Care | Payer: Medicare Other | Source: Ambulatory Visit | Attending: Nurse Practitioner | Admitting: Nurse Practitioner

## 2017-06-19 ENCOUNTER — Encounter (HOSPITAL_COMMUNITY): Payer: Self-pay | Admitting: Nurse Practitioner

## 2017-06-19 VITALS — BP 104/62 | HR 75 | Ht 69.0 in | Wt 257.8 lb

## 2017-06-19 DIAGNOSIS — Z9889 Other specified postprocedural states: Secondary | ICD-10-CM | POA: Insufficient documentation

## 2017-06-19 DIAGNOSIS — I480 Paroxysmal atrial fibrillation: Secondary | ICD-10-CM | POA: Diagnosis not present

## 2017-06-19 DIAGNOSIS — E785 Hyperlipidemia, unspecified: Secondary | ICD-10-CM | POA: Diagnosis not present

## 2017-06-19 DIAGNOSIS — Z79890 Hormone replacement therapy (postmenopausal): Secondary | ICD-10-CM | POA: Diagnosis not present

## 2017-06-19 DIAGNOSIS — G4730 Sleep apnea, unspecified: Secondary | ICD-10-CM | POA: Diagnosis not present

## 2017-06-19 DIAGNOSIS — Z7901 Long term (current) use of anticoagulants: Secondary | ICD-10-CM | POA: Insufficient documentation

## 2017-06-19 DIAGNOSIS — M109 Gout, unspecified: Secondary | ICD-10-CM | POA: Diagnosis not present

## 2017-06-19 DIAGNOSIS — I4892 Unspecified atrial flutter: Secondary | ICD-10-CM | POA: Diagnosis not present

## 2017-06-19 DIAGNOSIS — Z950 Presence of cardiac pacemaker: Secondary | ICD-10-CM | POA: Insufficient documentation

## 2017-06-19 DIAGNOSIS — Z79899 Other long term (current) drug therapy: Secondary | ICD-10-CM | POA: Diagnosis not present

## 2017-06-19 DIAGNOSIS — N183 Chronic kidney disease, stage 3 (moderate): Secondary | ICD-10-CM | POA: Diagnosis not present

## 2017-06-19 DIAGNOSIS — Z833 Family history of diabetes mellitus: Secondary | ICD-10-CM | POA: Diagnosis not present

## 2017-06-19 DIAGNOSIS — I1310 Hypertensive heart and chronic kidney disease without heart failure, with stage 1 through stage 4 chronic kidney disease, or unspecified chronic kidney disease: Secondary | ICD-10-CM | POA: Diagnosis not present

## 2017-06-19 DIAGNOSIS — I6523 Occlusion and stenosis of bilateral carotid arteries: Secondary | ICD-10-CM | POA: Insufficient documentation

## 2017-06-19 DIAGNOSIS — Z87891 Personal history of nicotine dependence: Secondary | ICD-10-CM | POA: Insufficient documentation

## 2017-06-19 DIAGNOSIS — I2510 Atherosclerotic heart disease of native coronary artery without angina pectoris: Secondary | ICD-10-CM | POA: Insufficient documentation

## 2017-06-19 DIAGNOSIS — Z820 Family history of epilepsy and other diseases of the nervous system: Secondary | ICD-10-CM | POA: Diagnosis not present

## 2017-06-19 DIAGNOSIS — E039 Hypothyroidism, unspecified: Secondary | ICD-10-CM | POA: Insufficient documentation

## 2017-06-19 DIAGNOSIS — Z823 Family history of stroke: Secondary | ICD-10-CM | POA: Insufficient documentation

## 2017-06-19 DIAGNOSIS — Z87442 Personal history of urinary calculi: Secondary | ICD-10-CM | POA: Insufficient documentation

## 2017-06-19 NOTE — Progress Notes (Signed)
Primary Care Physician: [name], MD Referring Physician:Dr. Thereasa Distance Erik [name] is a 72 y.o. male with a h/o PPM, for SSS, afib, s/p ablation,05/17/17. He is in afib clinic for f/u. He has not noted any significant afib since procedure. No swallowing or groin issues. He has lost 14 lbs since procedure.  Today, he denies symptoms of palpitations, chest pain, shortness of breath, orthopnea, PND, lower extremity edema, dizziness, presyncope, syncope, or neurologic sequela. The patient is tolerating medications without difficulties and is otherwise without complaint today.   Past Medical History:  Diagnosis Date  . Anxiety   . BPH with urinary obstruction   . Carotid disease, bilateral ([place])    a. 09/2014 Carotid U/S: 1-39% bilat ICA stenosis.  . Chronic lower back pain   . CKD (chronic kidney disease), stage III ([place])   . Diverticulitis 12/2016  . Diverticulosis   . Gout    "couple days/year" (05/17/2017)  . History of kidney stones   . Hyperlipidemia   . Hypertension   . Hypertensive heart disease   . Hypothyroidism   . Internal hemorrhoids   . Nephrolithiasis   . Non-obstructive CAD    a. 07/2002 Cath: LM 20, LAD 28m/d, LCX 50-10m, OM1 44m, RCA 53m, EF 60%; b. 05/2014 MV: low risk w/ small sized, mild intensity rev defect in apical/inferior/infsept area, nl EF->Med Rx.  . Panic attack   . Paroxysmal atrial fibrillation ([place])    a. Dx 04/2014; b. 05/2014 Echo: Ef 60-65%, no rwma, triv MR/TR, nl RV;  c. CHA2DS2VASc = 3-->was on eliquis, switched to xarelto 04/2015 2/2 cost; d. 05/2015 Tikosyn loaded w/ conversion to AF; e. 06/2015 QTc prolongation and bradycardia->tikosyn d/c'd, PPM placed, Amio started; f. 07/01/2015 In Aflutter @ clinic f/u.  [name] Paroxysmal atrial flutter ([place])    a. 06/2015 noted to be in rapid Aflutter in Afib clinic-->amio load continued.  . Presence of permanent cardiac pacemaker   . Prolapsed internal hemorrhoids, grade 2-3 10/19/2016   LL Gr 3 and RP/RA Gr 2  LL banded 10/19/2016   . RLL pneumonia ([place]) 11/05/2012  . Sleep apnea    "can't tolerate mask" (05/17/2017)  . Tachy-brady syndrome ([place])    a. 06/27/2015 s/p SJM DC PPM (ser # @ [id number]).   Past Surgical History:  Procedure Laterality Date  . ATRIAL FIBRILLATION ABLATION N/A 05/17/2017   Procedure: ATRIAL FIBRILLATION ABLATION;  Surgeon: Constance Haw, MD;  Location: [facility];  Service: Cardiovascular;  Laterality: N/A;  . BACK SURGERY    . CARDIAC CATHETERIZATION  ~ 2000  . CLOSED REDUCTION HAND FRACTURE Right 1984  . COLONOSCOPY  06/27/2015   per Dr. Henrene Pastor, internal hemorrhoids and diverticulae, repeat 10 yrs  . EP IMPLANTABLE DEVICE N/A 06/27/2015   Procedure: Pacemaker Implant;  Surgeon: Evans Lance, MD;  Location: [facility];  Service: Cardiovascular;  Laterality: N/A;  . FRACTURE SURGERY    . HEMORRHOID BANDING    . INSERT / REPLACE / REMOVE PACEMAKER    . LACERATION REPAIR Right 1984   "hand"  . POSTERIOR LUMBAR FUSION  1998   2 lumbar discs, Dr. Ellene Route; "ray cages"    Current Outpatient Medications  Medication Sig Dispense Refill  . acetaminophen (TYLENOL) 500 MG tablet Take 1,000 mg by mouth every 6 (six) hours as needed for moderate pain or headache.     . diltiazem (CARDIZEM CD) 300 MG 24 hr capsule Take 1 capsule (300 mg total)  by mouth daily. 90 capsule 2  . hydrocortisone (ANUSOL-HC) 25 MG suppository Place 1 suppository (25 mg total) rectally at bedtime as needed. 12 suppository 6  . ibuprofen (ADVIL,MOTRIN) 200 MG tablet Take 200 mg by mouth every 8 (eight) hours as needed for headache or moderate pain.     [name] levothyroxine (SYNTHROID, LEVOTHROID) 50 MCG tablet TAKE 1 TABLET (50 MCG TOTAL) BY MOUTH DAILY. 90 tablet 3  . losartan-hydrochlorothiazide (HYZAAR) 100-25 MG tablet TAKE 1 TABLET BY MOUTH EVERY DAY 90 tablet 2  . rivaroxaban (XARELTO) 20 MG TABS tablet Take 1 tablet (20 mg total) by mouth daily with supper. 90 tablet 1  . simvastatin  (ZOCOR) 20 MG tablet Take 1 tablet (20 mg total) by mouth daily. 30 tablet 3  . tamsulosin (FLOMAX) 0.4 MG CAPS capsule TAKE 1 CAPSULE BY MOUTH EVERY DAY 30 capsule 11  . diltiazem (CARDIZEM) 30 MG tablet Cardizem 30mg  -- take 1 tablet every 4 hours AS NEEDED for heart rate >100 as long as blood pressure >100. (Patient not taking: Reported on 06/19/2017) 90 tablet 1   No current facility-administered medications for this encounter.     No Known Allergies  Social History   Socioeconomic History  . Marital status: Married    Spouse name: Not on file  . Number of children: 1  . Years of education: Not on file  . Highest education level: Not on file  Occupational History  . Occupation: retired  Scientific laboratory technician  . Financial resource strain: Not on file  . Food insecurity:    Worry: Not on file    Inability: Not on file  . Transportation needs:    Medical: Not on file    Non-medical: Not on file  Tobacco Use  . Smoking status: Former Smoker    Packs/day: 2.00    Years: 28.00    Pack years: 56.00    Types: Cigarettes    Last attempt to quit: 02/27/1995    Years since quitting: 22.3  . Smokeless tobacco: Never Used  Substance and Sexual Activity  . Alcohol use: Yes    Alcohol/week: 0.0 oz    Comment: couple times a month  . Drug use: No  . Sexual activity: Not Currently    Partners: Female  Lifestyle  . Physical activity:    Days per week: Not on file    Minutes per session: Not on file  . Stress: Not on file  Relationships  . Social connections:    Talks on phone: Not on file    Gets together: Not on file    Attends religious service: Not on file    Active member of club or organization: Not on file    Attends meetings of clubs or organizations: Not on file    Relationship status: Not on file  . Intimate partner violence:    Fear of current or ex partner: Not on file    Emotionally abused: Not on file    Physically abused: Not on file    Forced sexual activity: Not on  file  Other Topics Concern  . Not on file  Social History Narrative  . Not on file    Family History  Problem Relation Age of Onset  . Dementia Mother   . Stroke Father   . Diabetes Paternal Grandmother   . Stroke Paternal Aunt        x 2  . Cancer Paternal Aunt        type unknown  .  Colon cancer Neg Hx   . Stomach cancer Neg Hx     ROS- All systems are reviewed and negative except as per the HPI above  Physical Exam: Vitals:   06/19/17 0928  BP: 104/62  Pulse: 75  Weight: 257 lb 12.8 oz (116.9 kg)  Height: 5\' 9"  (1.753 m)   Wt Readings from Last 3 Encounters:  06/19/17 257 lb 12.8 oz (116.9 kg)  05/18/17 271 lb 2.7 oz (123 kg)  05/13/17 259 lb 12.8 oz (117.8 kg)    Labs: Lab Results  Component Value Date   NA 145 (H) 05/13/2017   K 4.0 05/13/2017   CL 104 05/13/2017   CO2 23 05/13/2017   GLUCOSE 101 (H) 05/13/2017   BUN 25 05/13/2017   CREATININE 1.57 (H) 05/13/2017   CALCIUM 9.5 05/13/2017   PHOS 2.1 (L) 05/15/2014   MG 1.8 06/28/2015   Lab Results  Component Value Date   INR 1.11 07/15/2015   Lab Results  Component Value Date   CHOL 136 05/18/2017   HDL 37 (L) 05/18/2017   LDLCALC 82 05/18/2017   TRIG 85 05/18/2017     GEN- The patient is well appearing, alert and oriented x 3 today.   Head- normocephalic, atraumatic Eyes-  Sclera clear, conjunctiva pink Ears- hearing intact Oropharynx- clear Neck- supple, no JVP Lymph- no cervical lymphadenopathy Lungs- Clear to ausculation bilaterally, normal work of breathing Heart- Regular rate and rhythm, no murmurs, rubs or gallops, PMI not laterally displaced GI- soft, NT, ND, + BS Extremities- no clubbing, cyanosis, or edema MS- no significant deformity or atrophy Skin- no rash or lesion Psych- euthymic mood, full affect Neuro- strength and sensation are intact  EKG-NSR at 75 bpm, Pr int 156 ms, qrs int 86 ms, qtc 426 ms Epic records reviewed    Assessment and Plan: 1. Afib S/p ablation  3/22 Doing well without any swallowing issues or groin issues Reminded not to interrupt xarelto 20 mg daily for the 3 months following ablation Chadsvasc score is at least 3  2. PPM Per Dr Curt Bears Remote check 4/30  3. HTN  Stable   F/u with Dr. Curt Bears 6/25  Geroge Baseman. Aailyah Dunbar, [facility] [address] [place], [place] [address] [phone]

## 2017-06-25 ENCOUNTER — Ambulatory Visit (INDEPENDENT_AMBULATORY_CARE_PROVIDER_SITE_OTHER): Payer: Medicare Other | Admitting: *Deleted

## 2017-06-25 DIAGNOSIS — I495 Sick sinus syndrome: Secondary | ICD-10-CM

## 2017-06-25 NOTE — Progress Notes (Signed)
Remote pacemaker transmission.   

## 2017-06-26 ENCOUNTER — Encounter: Payer: Self-pay | Admitting: Cardiology

## 2017-07-01 ENCOUNTER — Other Ambulatory Visit: Payer: Self-pay | Admitting: Cardiology

## 2017-07-16 LAB — CUP PACEART REMOTE DEVICE CHECK
Battery Remaining Longevity: 115 mo
Battery Remaining Percentage: 95.5 %
Brady Statistic AP VS Percent: 83 %
Brady Statistic AS VP Percent: 1 %
Brady Statistic RA Percent Paced: 87 %
Date Time Interrogation Session: 20190430080014
Implantable Lead Implant Date: 20170501
Implantable Lead Location: 753860
Lead Channel Impedance Value: 480 Ohm
Lead Channel Pacing Threshold Amplitude: 0.75 V
Lead Channel Pacing Threshold Amplitude: 0.875 V
Lead Channel Pacing Threshold Pulse Width: 0.5 ms
Lead Channel Sensing Intrinsic Amplitude: 12 mV
MDC IDC LEAD IMPLANT DT: 20170501
MDC IDC LEAD LOCATION: 753859
MDC IDC MSMT BATTERY VOLTAGE: 2.99 V
MDC IDC MSMT LEADCHNL RA SENSING INTR AMPL: 3.7 mV
MDC IDC MSMT LEADCHNL RV IMPEDANCE VALUE: 550 Ohm
MDC IDC MSMT LEADCHNL RV PACING THRESHOLD PULSEWIDTH: 0.5 ms
MDC IDC PG IMPLANT DT: 20170501
MDC IDC SET LEADCHNL RA PACING AMPLITUDE: 1.875
MDC IDC SET LEADCHNL RV PACING AMPLITUDE: 2.5 V
MDC IDC SET LEADCHNL RV PACING PULSEWIDTH: 0.5 ms
MDC IDC SET LEADCHNL RV SENSING SENSITIVITY: 2 mV
MDC IDC STAT BRADY AP VP PERCENT: 4.7 %
MDC IDC STAT BRADY AS VS PERCENT: 13 %
MDC IDC STAT BRADY RV PERCENT PACED: 4.7 %
Pulse Gen Model: 2272
Pulse Gen Serial Number: 7897836

## 2017-07-23 ENCOUNTER — Encounter: Payer: Self-pay | Admitting: Family Medicine

## 2017-07-23 ENCOUNTER — Ambulatory Visit (INDEPENDENT_AMBULATORY_CARE_PROVIDER_SITE_OTHER): Payer: Medicare Other | Admitting: Family Medicine

## 2017-07-23 VITALS — BP 98/66 | HR 64 | Temp 98.0°F | Ht 69.0 in | Wt 255.8 lb

## 2017-07-23 DIAGNOSIS — R739 Hyperglycemia, unspecified: Secondary | ICD-10-CM

## 2017-07-23 DIAGNOSIS — E039 Hypothyroidism, unspecified: Secondary | ICD-10-CM

## 2017-07-23 DIAGNOSIS — G629 Polyneuropathy, unspecified: Secondary | ICD-10-CM | POA: Diagnosis not present

## 2017-07-23 DIAGNOSIS — I10 Essential (primary) hypertension: Secondary | ICD-10-CM | POA: Diagnosis not present

## 2017-07-23 LAB — CBC WITH DIFFERENTIAL/PLATELET
BASOS ABS: 0 10*3/uL (ref 0.0–0.1)
BASOS PCT: 0.6 % (ref 0.0–3.0)
EOS ABS: 0.3 10*3/uL (ref 0.0–0.7)
Eosinophils Relative: 4.3 % (ref 0.0–5.0)
HEMATOCRIT: 42.1 % (ref 39.0–52.0)
Hemoglobin: 14.7 g/dL (ref 13.0–17.0)
LYMPHS PCT: 18.9 % (ref 12.0–46.0)
Lymphs Abs: 1.2 10*3/uL (ref 0.7–4.0)
MCHC: 34.9 g/dL (ref 30.0–36.0)
MCV: 85.6 fl (ref 78.0–100.0)
MONOS PCT: 11.7 % (ref 3.0–12.0)
Monocytes Absolute: 0.7 10*3/uL (ref 0.1–1.0)
NEUTROS ABS: 3.9 10*3/uL (ref 1.4–7.7)
Neutrophils Relative %: 64.5 % (ref 43.0–77.0)
Platelets: 211 10*3/uL (ref 150.0–400.0)
RBC: 4.92 Mil/uL (ref 4.22–5.81)
RDW: 13.9 % (ref 11.5–15.5)
WBC: 6.1 10*3/uL (ref 4.0–10.5)

## 2017-07-23 LAB — BASIC METABOLIC PANEL
BUN: 22 mg/dL (ref 6–23)
CALCIUM: 9.4 mg/dL (ref 8.4–10.5)
CO2: 26 meq/L (ref 19–32)
CREATININE: 1.34 mg/dL (ref 0.40–1.50)
Chloride: 105 mEq/L (ref 96–112)
GFR: 55.95 mL/min — AB (ref >60.00)
Glucose, Bld: 93 mg/dL (ref 70–99)
Potassium: 3.5 mEq/L (ref 3.5–5.1)
SODIUM: 142 meq/L (ref 135–145)

## 2017-07-23 LAB — HEPATIC FUNCTION PANEL
ALBUMIN: 4.2 g/dL (ref 3.5–5.2)
ALK PHOS: 51 U/L (ref 39–117)
ALT: 22 U/L (ref 0–53)
AST: 14 U/L (ref 0–37)
BILIRUBIN DIRECT: 0.1 mg/dL (ref 0.0–0.3)
TOTAL PROTEIN: 7 g/dL (ref 6.0–8.3)
Total Bilirubin: 0.7 mg/dL (ref 0.2–1.2)

## 2017-07-23 LAB — T4, FREE: FREE T4: 0.94 ng/dL (ref 0.60–1.60)

## 2017-07-23 LAB — T3, FREE: T3 FREE: 3.9 pg/mL (ref 2.3–4.2)

## 2017-07-23 LAB — VITAMIN B12: Vitamin B-12: 384 pg/mL (ref 211–911)

## 2017-07-23 LAB — HEMOGLOBIN A1C: HEMOGLOBIN A1C: 5.5 % (ref 4.6–6.5)

## 2017-07-23 LAB — TSH: TSH: 3.64 u[IU]/mL (ref 0.35–4.50)

## 2017-07-23 NOTE — Progress Notes (Signed)
   Subjective:    Patient ID: Erik [name], male    DOB: March 07, 1945, 72 y.o.   MRN: [id number]  HPI Here for intermittent feelings of numbness or tingling or pain in the feet. This started on the right side about 2 months ago and then on the left side 2 weeks ago. No swelling or color changes noted. No recent medication changes.   Review of Systems  Constitutional: Negative.   Respiratory: Negative.   Cardiovascular: Negative.   Neurological: Positive for numbness.       Objective:   Physical Exam  Constitutional: He appears well-developed and well-nourished.  Cardiovascular: Normal rate, regular rhythm, normal heart sounds and intact distal pulses.  Pulmonary/Chest: Effort normal and breath sounds normal. No stridor. No respiratory distress. He has no wheezes. He has no rales. He exhibits no tenderness.  Musculoskeletal: He exhibits no edema.  Both feet are warm and pink.           Assessment & Plan:  Peripheral neuropathy. Get labs today to investigate.  [name], MD

## 2017-07-30 ENCOUNTER — Encounter: Payer: Self-pay | Admitting: Family Medicine

## 2017-07-30 ENCOUNTER — Ambulatory Visit (INDEPENDENT_AMBULATORY_CARE_PROVIDER_SITE_OTHER): Payer: Medicare Other | Admitting: Family Medicine

## 2017-07-30 VITALS — BP 124/70 | HR 62 | Temp 97.7°F | Ht 69.0 in | Wt 257.8 lb

## 2017-07-30 DIAGNOSIS — G629 Polyneuropathy, unspecified: Secondary | ICD-10-CM | POA: Diagnosis not present

## 2017-07-30 HISTORY — DX: Polyneuropathy, unspecified: G62.9

## 2017-07-30 MED ORDER — GABAPENTIN 100 MG PO CAPS: 100.0000 mg | ORAL_CAPSULE | Freq: Two times a day (BID) | ORAL | 3 refills | Status: DC

## 2017-07-30 NOTE — Progress Notes (Signed)
   Subjective:    Patient ID: Erik [name], male    DOB: 05-25-1945, 72 y.o.   MRN: [id number]  HPI Here to follow up on neuropathy in both feet. We discussed this at his last OV and he had labs drawn to investigate this, but everything came back as normal. He would like to try something to treat this.    Review of Systems  Constitutional: Negative.   Respiratory: Negative.   Cardiovascular: Negative.   Neurological: Positive for numbness. Negative for weakness.       Objective:   Physical Exam  Constitutional: He is oriented to person, place, and time. He appears well-developed and well-nourished.  Cardiovascular: Normal rate, regular rhythm, normal heart sounds and intact distal pulses.  Pulmonary/Chest: Effort normal and breath sounds normal.  Neurological: He is alert and oriented to person, place, and time.          Assessment & Plan:  Neuropathy, try Gabapentin 100 mg bid. Recheck in 2 weeks.  [name], MD

## 2017-08-19 ENCOUNTER — Other Ambulatory Visit: Payer: Self-pay | Admitting: Family Medicine

## 2017-08-20 ENCOUNTER — Encounter: Payer: Self-pay | Admitting: Cardiology

## 2017-08-20 ENCOUNTER — Ambulatory Visit (INDEPENDENT_AMBULATORY_CARE_PROVIDER_SITE_OTHER): Payer: Medicare Other | Admitting: Cardiology

## 2017-08-20 VITALS — BP 110/66 | HR 67 | Ht 72.0 in | Wt 255.2 lb

## 2017-08-20 DIAGNOSIS — I481 Persistent atrial fibrillation: Secondary | ICD-10-CM | POA: Diagnosis not present

## 2017-08-20 DIAGNOSIS — I495 Sick sinus syndrome: Secondary | ICD-10-CM

## 2017-08-20 DIAGNOSIS — I10 Essential (primary) hypertension: Secondary | ICD-10-CM | POA: Diagnosis not present

## 2017-08-20 DIAGNOSIS — Z45018 Encounter for adjustment and management of other part of cardiac pacemaker: Secondary | ICD-10-CM

## 2017-08-20 DIAGNOSIS — R0602 Shortness of breath: Secondary | ICD-10-CM

## 2017-08-20 DIAGNOSIS — I4819 Other persistent atrial fibrillation: Secondary | ICD-10-CM

## 2017-08-20 LAB — CUP PACEART INCLINIC DEVICE CHECK
Brady Statistic RA Percent Paced: 86 %
Implantable Lead Implant Date: 20170501
Implantable Lead Location: 753859
Lead Channel Pacing Threshold Amplitude: 1 V
Lead Channel Pacing Threshold Amplitude: 1 V
Lead Channel Pacing Threshold Pulse Width: 0.5 ms
Lead Channel Pacing Threshold Pulse Width: 0.5 ms
Lead Channel Pacing Threshold Pulse Width: 0.5 ms
Lead Channel Sensing Intrinsic Amplitude: 12 mV
Lead Channel Sensing Intrinsic Amplitude: 2.7 mV
Lead Channel Setting Pacing Amplitude: 1.875
Lead Channel Setting Pacing Amplitude: 2.5 V
Lead Channel Setting Pacing Pulse Width: 0.5 ms
Lead Channel Setting Sensing Sensitivity: 2 mV
MDC IDC LEAD IMPLANT DT: 20170501
MDC IDC LEAD LOCATION: 753860
MDC IDC MSMT BATTERY REMAINING LONGEVITY: 118 mo
MDC IDC MSMT BATTERY VOLTAGE: 2.99 V
MDC IDC MSMT LEADCHNL RA IMPEDANCE VALUE: 450 Ohm
MDC IDC MSMT LEADCHNL RA PACING THRESHOLD AMPLITUDE: 1 V
MDC IDC MSMT LEADCHNL RA PACING THRESHOLD PULSEWIDTH: 0.5 ms
MDC IDC MSMT LEADCHNL RV IMPEDANCE VALUE: 550 Ohm
MDC IDC MSMT LEADCHNL RV PACING THRESHOLD AMPLITUDE: 1 V
MDC IDC PG IMPLANT DT: 20170501
MDC IDC SESS DTM: 20190625102811
MDC IDC STAT BRADY RV PERCENT PACED: 4.8 %
Pulse Gen Model: 2272
Pulse Gen Serial Number: 7897836

## 2017-08-20 LAB — PRO B NATRIURETIC PEPTIDE: NT-PRO BNP: 90 pg/mL (ref 0–376)

## 2017-08-20 NOTE — Patient Instructions (Addendum)
Medication Instructions:  Your physician recommends that you continue on your current medications as directed. Please refer to the Current Medication list given to you today.  *If you need a refill on your cardiac medications before your next appointment, please call your pharmacy*  Labwork: BNP today  Testing/Procedures: None ordered  Follow-Up: Remote monitoring is used to monitor your Pacemaker or ICD from home. This monitoring reduces the number of office visits required to check your device to one time per year. It allows [place] to keep an eye on the functioning of your device to ensure it is working properly. You are scheduled for a device check from home on 09/24/2017. You may send your transmission at any time that day. If you have a wireless device, the transmission will be sent automatically. After your physician reviews your transmission, you will receive a postcard with your next transmission date.  Your physician recommends that you schedule a follow-up appointment in : 3 months with Dr. Curt Bears.  Thank you for choosing [facility]!!   Trinidad Curet, RN [phone]

## 2017-08-20 NOTE — Progress Notes (Signed)
Electrophysiology Office Note   Date:  08/20/2017   ID:  Erik [name], DOB [birth year], MRN [id number]  PCP:  [name], MD  Cardiologist:  [name] Primary Electrophysiologist:  Constance Haw, MD    Chief Complaint  Patient presents with  . Pacemaker Check    Persistent Afib/Tachycardia-Bradycardia syndrome     History of Present Illness: Erik [name] is a 72 y.o. male who presents today for electrophysiology evaluation.   He presents today for workup of his atrial fibrillation.  He has a history of nonobstructive coronary artery disease, hypertension, hyperlipidemia, hypothyroidism. In March 2016, he was diagnosed with atrial fibrillation with RVR in the setting of chest pain. He was loaded on tikosyn which was stopped as he went back in atrial fibrillation on 500 mcg twice a day..  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  He had an atrial fibrillation ablation 05/17/2017.  Today, denies symptoms of palpitations, chest pain, shortness of breath, orthopnea, PND, lower extremity edema, claudication, dizziness, presyncope, syncope, bleeding, or neurologic sequela. The patient is tolerating medications without difficulties.  Overall doing well.  He has had minimal atrial arrhythmias since his ablation.  It all of his arrhythmias are due to atrial tachycardia.  He does continue to be short of breath with exertion.  This is minimally changed since his ablation.   Past Medical History:  Diagnosis Date  . Anxiety   . Atypical chest pain 06/26/2015  . BPH with urinary obstruction   . Carotid disease, bilateral ([place])    a. 09/2014 Carotid U/S: 1-39% bilat ICA stenosis.  . Chronic lower back pain   . CKD (chronic kidney disease), stage III ([place])   . Diverticulitis 12/2016  . Diverticulosis   . Gout    "couple days/year" (05/17/2017)  . History of kidney stones   . Hyperlipidemia   . Hypertension   . Hypertensive heart disease   .  Hypothyroidism   . Internal hemorrhoids   . Long QT interval 06/26/2015  . Nephrolithiasis   . Neuropathy 07/30/2017  . Non-obstructive CAD    a. 07/2002 Cath: LM 20, LAD 35m/d, LCX 50-78m, OM1 35m, RCA 57m, EF 60%; b. 05/2014 MV: low risk w/ small sized, mild intensity rev defect in apical/inferior/infsept area, nl EF->Med Rx.  . Panic attack   . Paroxysmal atrial fibrillation ([place])    a. Dx 04/2014; b. 05/2014 Echo: Ef 60-65%, no rwma, triv MR/TR, nl RV;  c. CHA2DS2VASc = 3-->was on eliquis, switched to xarelto 04/2015 2/2 cost; d. 05/2015 Tikosyn loaded w/ conversion to AF; e. 06/2015 QTc prolongation and bradycardia->tikosyn d/c'd, PPM placed, Amio started; f. 07/01/2015 In Aflutter @ clinic f/u.  [name] Paroxysmal atrial flutter ([place])    a. 06/2015 noted to be in rapid Aflutter in Afib clinic-->amio load continued.  . Persistent atrial fibrillation ([place]) 06/21/2015  . Presence of permanent cardiac pacemaker   . Prolapsed internal hemorrhoids, grade 2-3 10/19/2016   LL Gr 3 and RP/RA Gr 2 LL banded 10/19/2016   . RLL pneumonia ([place]) 11/05/2012  . Sleep apnea    "can't tolerate mask" (05/17/2017)  . Tachy-brady syndrome ([place])    a. 06/27/2015 s/p SJM DC PPM (ser # @ [id number]).   Past Surgical History:  Procedure Laterality Date  . ATRIAL FIBRILLATION ABLATION N/A 05/17/2017   Procedure: ATRIAL FIBRILLATION ABLATION;  Surgeon: Constance Haw, MD;  Location: [facility];  Service: Cardiovascular;  Laterality: N/A;  . BACK  SURGERY    . CARDIAC CATHETERIZATION  ~ 2000  . CLOSED REDUCTION HAND FRACTURE Right 1984  . COLONOSCOPY  06/27/2015   per Dr. Henrene Pastor, internal hemorrhoids and diverticulae, repeat 10 yrs  . EP IMPLANTABLE DEVICE N/A 06/27/2015   Procedure: Pacemaker Implant;  Surgeon: Evans Lance, MD;  Location: [facility];  Service: Cardiovascular;  Laterality: N/A;  . FRACTURE SURGERY    . HEMORRHOID BANDING    . INSERT / REPLACE / REMOVE PACEMAKER    . LACERATION REPAIR Right 1984    "hand"  . POSTERIOR LUMBAR FUSION  1998   2 lumbar discs, Dr. Ellene Route; "ray cages"     Current Outpatient Medications  Medication Sig Dispense Refill  . acetaminophen (TYLENOL) 500 MG tablet Take 1,000 mg by mouth every 6 (six) hours as needed for moderate pain or headache.     . diltiazem (CARDIZEM CD) 300 MG 24 hr capsule TAKE 1 CAPSULE BY MOUTH DAILY. 90 capsule 3  . diltiazem (CARDIZEM) 30 MG tablet Cardizem 30mg  -- take 1 tablet every 4 hours AS NEEDED for heart rate >100 as long as blood pressure >100. (Patient not taking: Reported on 07/30/2017) 90 tablet 1  . gabapentin (NEURONTIN) 100 MG capsule Take 1 capsule (100 mg total) by mouth 2 (two) times daily. 60 capsule 3  . ibuprofen (ADVIL,MOTRIN) 200 MG tablet Take 200 mg by mouth every 8 (eight) hours as needed for headache or moderate pain.     [name] levothyroxine (SYNTHROID, LEVOTHROID) 50 MCG tablet TAKE 1 TABLET (50 MCG TOTAL) BY MOUTH DAILY. 90 tablet 3  . losartan-hydrochlorothiazide (HYZAAR) 100-25 MG tablet TAKE 1 TABLET BY MOUTH EVERY DAY 90 tablet 2  . rivaroxaban (XARELTO) 20 MG TABS tablet Take 1 tablet (20 mg total) by mouth daily with supper. 90 tablet 1  . simvastatin (ZOCOR) 20 MG tablet Take 1 tablet (20 mg total) by mouth daily. 30 tablet 3  . tamsulosin (FLOMAX) 0.4 MG CAPS capsule TAKE 1 CAPSULE BY MOUTH EVERY DAY 30 capsule 11   No current facility-administered medications for this visit.     Allergies:   Patient has no known allergies.   Social History:  The patient  reports that he quit smoking about 22 years ago. His smoking use included cigarettes. He has a 56.00 pack-year smoking history. He has never used smokeless tobacco. He reports that he drinks alcohol. He reports that he does not use drugs.   Family History:  The patient's family history includes Cancer in his paternal aunt; Dementia in his mother; Diabetes in his paternal grandmother; Stroke in his father and paternal aunt.   ROS:  Please see the  history of present illness.   Otherwise, review of systems is positive for none.   All other systems are reviewed and negative.   PHYSICAL EXAM: VS:  BP 110/66   Pulse 67   Ht 6' (1.829 m)   Wt 255 lb 3.2 oz (115.8 kg)   SpO2 99%   BMI 34.61 kg/m  , BMI Body mass index is 34.61 kg/m. GEN: Well nourished, well developed, in no acute distress  HEENT: normal  Neck: no JVD, carotid bruits, or masses Cardiac: RRR; no murmurs, rubs, or gallops,no edema  Respiratory:  clear to auscultation bilaterally, normal work of breathing GI: soft, nontender, nondistended, + BS MS: no deformity or atrophy  Skin: warm and dry Neuro:  Strength and sensation are intact Psych: euthymic mood, full affect  EKG:  EKG is  ordered today. Personal review of the ekg ordered shows A paced, rate 67   Recent Labs: 07/23/2017: ALT 22; BUN 22; Creatinine, Ser 1.34; Hemoglobin 14.7; Platelets 211.0; Potassium 3.5; Sodium 142; TSH 3.64    Lipid Panel     Component Value Date/Time   CHOL 136 05/18/2017 0514   TRIG 85 05/18/2017 0514   TRIG 203 (HH) 12/10/2005 0839   HDL 37 (L) 05/18/2017 0514   CHOLHDL 3.7 05/18/2017 0514   VLDL 17 05/18/2017 0514   LDLCALC 82 05/18/2017 0514   LDLDIRECT 104.9 06/09/2009 0919     Wt Readings from Last 3 Encounters:  08/20/17 255 lb 3.2 oz (115.8 kg)  07/30/17 257 lb 12.8 oz (116.9 kg)  07/23/17 255 lb 12.8 oz (116 kg)      Other studies Reviewed: Additional studies/ records that were reviewed today include: TTE 11/10/15 Review of the above records today demonstrates:  - Left ventricle: The cavity size was normal. Wall thickness was   increased in a pattern of mild LVH. The estimated ejection   fraction was 55%. Wall motion was normal; there were no regional   wall motion abnormalities. Doppler parameters are consistent with   abnormal left ventricular relaxation (grade 1 diastolic   dysfunction). - Aortic valve: There was no stenosis. - Mitral valve: There was  trivial regurgitation. - Left atrium: The atrium was mildly dilated. - Right ventricle: The cavity size was normal. Pacer wire or   catheter noted in right ventricle. Systolic function was normal. - Pulmonary arteries: No complete TR doppler jet so unable to   estimate PA systolic pressure. - Inferior vena cava: The vessel was normal in size. The   respirophasic diameter changes were in the normal range (= 50%),   consistent with normal central venous pressure. - Pericardium, extracardiac: A trivial pericardial effusion was   identified posterior to the heart.  Myoview 06/04/14 Low risk stress nuclear study with a small sized, mild intensity reversible defect concering for mild apical-inferior/inferoseptal ischeima.  Clinical Correlation is recommended.  ASSESSMENT AND PLAN:  1.  Persistent atrial fibrillation: Status post atrial fibrillation ablation 05/17/2017.  Currently on Xarelto and diltiazem.  Has had minimal atrial arrhythmias all of which appear to be atrial tachycardia.  No changes.    This patients CHA2DS2-VASc Score and unadjusted Ischemic Stroke Rate (% per year) is equal to 2.2 % stroke rate/year from a score of 2  Above score calculated as 1 point each if present [CHF, HTN, DM, Vascular=MI/PAD/Aortic Plaque, Age if 65-74, or Male] Above score calculated as 2 points each if present [Age > 75, or Stroke/TIA/TE]   2. Hypertension: Well-controlled today.  No changes.  3.  Sinus node dysfunction: Status post Saint Jude dual-chamber pacemaker functioning appropriately.  No changes.  4. Shortness of breath: Continued even after atrial fibrillation ablation.  Erik [name] order a BNP for further determination of whether or not this could be cardiac.  It does not appear that he is volume overloaded at this time.  Current medicines are reviewed at length with the patient today.   The patient does not have concerns regarding his medicines.  The following changes were made today:  None  Labs/ tests ordered today include:  Orders Placed This Encounter  Procedures  . Pro b natriuretic peptide (BNP)  . EKG 12-Lead     Disposition:   FU with Ambrosia Wisnewski 3 months  Signed, Cachet Mccutchen [name], MD  08/20/2017 9:54 AM     [facility] [address] [place]  [id number] [phone] (office) [phone]

## 2017-08-21 ENCOUNTER — Other Ambulatory Visit: Payer: Self-pay | Admitting: Family Medicine

## 2017-09-11 ENCOUNTER — Ambulatory Visit (INDEPENDENT_AMBULATORY_CARE_PROVIDER_SITE_OTHER): Payer: Medicare Other | Admitting: Family Medicine

## 2017-09-11 ENCOUNTER — Encounter: Payer: Self-pay | Admitting: Family Medicine

## 2017-09-11 VITALS — BP 116/68 | HR 71 | Temp 97.6°F | Wt 260.8 lb

## 2017-09-11 DIAGNOSIS — M25571 Pain in right ankle and joints of right foot: Secondary | ICD-10-CM

## 2017-09-11 DIAGNOSIS — M25572 Pain in left ankle and joints of left foot: Secondary | ICD-10-CM

## 2017-09-11 DIAGNOSIS — G8929 Other chronic pain: Secondary | ICD-10-CM | POA: Diagnosis not present

## 2017-09-11 NOTE — Progress Notes (Signed)
   Subjective:    Patient ID: Erik [name], male    DOB: [birth year], 72 y.o.   MRN: [id number]  HPI Here for worsening pain and swelling in both feet and ankles. He has numbness and tingling in the toes and he takes Gabapentin 100 mg bid for this. However the pain has worsened quite a bit. He has some pain and stiffness in the hands, and he has had back pain for years. Ibuprofen does not help much.    Review of Systems  Constitutional: Negative.   Respiratory: Negative.   Cardiovascular: Negative.   Musculoskeletal: Positive for arthralgias and joint swelling.  Neurological: Positive for numbness.       Objective:   Physical Exam  Constitutional: He appears well-developed and well-nourished.  Cardiovascular: Normal rate, regular rhythm, normal heart sounds and intact distal pulses.  Pulmonary/Chest: Effort normal and breath sounds normal.  Musculoskeletal:  He has 2+ edema in both feet and ankles, no tenderness or erythema or warmth          Assessment & Plan:  He has some neuropathy, but he seems to have an arthritis process going on in his feet as well. He will increase the Gabapentin to 300 mg bid. Refer to Rheumatology.  [name], MD

## 2017-09-20 ENCOUNTER — Ambulatory Visit (INDEPENDENT_AMBULATORY_CARE_PROVIDER_SITE_OTHER): Payer: Medicare Other | Admitting: *Deleted

## 2017-09-20 ENCOUNTER — Telehealth: Payer: Self-pay | Admitting: Cardiology

## 2017-09-20 DIAGNOSIS — I495 Sick sinus syndrome: Secondary | ICD-10-CM | POA: Diagnosis not present

## 2017-09-20 DIAGNOSIS — R001 Bradycardia, unspecified: Secondary | ICD-10-CM

## 2017-09-20 NOTE — Progress Notes (Signed)
Remote pacemaker transmission.   

## 2017-09-20 NOTE — Telephone Encounter (Signed)
Spoke with pt and reminded pt of remote transmission that is due today. Pt verbalized understanding.   

## 2017-10-08 DIAGNOSIS — R0602 Shortness of breath: Secondary | ICD-10-CM | POA: Diagnosis not present

## 2017-10-08 DIAGNOSIS — G629 Polyneuropathy, unspecified: Secondary | ICD-10-CM | POA: Diagnosis not present

## 2017-10-08 DIAGNOSIS — M25572 Pain in left ankle and joints of left foot: Secondary | ICD-10-CM | POA: Diagnosis not present

## 2017-10-08 DIAGNOSIS — M25571 Pain in right ankle and joints of right foot: Secondary | ICD-10-CM | POA: Diagnosis not present

## 2017-10-08 DIAGNOSIS — E669 Obesity, unspecified: Secondary | ICD-10-CM | POA: Diagnosis not present

## 2017-10-08 DIAGNOSIS — R5383 Other fatigue: Secondary | ICD-10-CM | POA: Diagnosis not present

## 2017-10-08 DIAGNOSIS — Z6836 Body mass index (BMI) 36.0-36.9, adult: Secondary | ICD-10-CM | POA: Diagnosis not present

## 2017-10-09 ENCOUNTER — Other Ambulatory Visit: Payer: Self-pay | Admitting: Family Medicine

## 2017-10-10 NOTE — Telephone Encounter (Signed)
Last OV 09/11/2017   Last refilled 08/21/2017 disp 60 with no refills   Sent to PCP for approval

## 2017-10-11 ENCOUNTER — Telehealth: Payer: Self-pay | Admitting: Family Medicine

## 2017-10-11 MED ORDER — GABAPENTIN 300 MG PO CAPS: 300.0000 mg | ORAL_CAPSULE | Freq: Two times a day (BID) | ORAL | 1 refills | Status: DC

## 2017-10-11 NOTE — Telephone Encounter (Signed)
I do see on 09/11/2017 the Gabapentin was increased to take 300 mg twice a day. I sent script e-scribe to [facility] for a 90 day supply with 1 refill.

## 2017-10-11 NOTE — Telephone Encounter (Signed)
Pts wife is in the office stating that she will not leave until this mix up is corrected.   She states that Gabapentin (Neurontin) 100MG  has been increased at the last appointment from 100 MG to 300 MG bid.

## 2017-10-30 LAB — CUP PACEART REMOTE DEVICE CHECK
Battery Remaining Longevity: 114 mo
Battery Voltage: 2.99 V
Brady Statistic AS VP Percent: 1 %
Brady Statistic AS VS Percent: 6.4 %
Brady Statistic RA Percent Paced: 93 %
Brady Statistic RV Percent Paced: 5.7 %
Date Time Interrogation Session: 20190726133909
Implantable Lead Implant Date: 20170501
Implantable Lead Location: 753859
Implantable Lead Location: 753860
Lead Channel Impedance Value: 450 Ohm
Lead Channel Impedance Value: 560 Ohm
Lead Channel Pacing Threshold Amplitude: 0.75 V
Lead Channel Pacing Threshold Pulse Width: 0.5 ms
Lead Channel Sensing Intrinsic Amplitude: 12 mV
Lead Channel Setting Pacing Amplitude: 1.75 V
MDC IDC LEAD IMPLANT DT: 20170501
MDC IDC MSMT BATTERY REMAINING PERCENTAGE: 95.5 %
MDC IDC MSMT LEADCHNL RA SENSING INTR AMPL: 3.3 mV
MDC IDC MSMT LEADCHNL RV PACING THRESHOLD AMPLITUDE: 1 V
MDC IDC MSMT LEADCHNL RV PACING THRESHOLD PULSEWIDTH: 0.5 ms
MDC IDC PG IMPLANT DT: 20170501
MDC IDC SET LEADCHNL RV PACING AMPLITUDE: 2.5 V
MDC IDC SET LEADCHNL RV PACING PULSEWIDTH: 0.5 ms
MDC IDC SET LEADCHNL RV SENSING SENSITIVITY: 2 mV
MDC IDC STAT BRADY AP VP PERCENT: 5.7 %
MDC IDC STAT BRADY AP VS PERCENT: 88 %
Pulse Gen Serial Number: 7897836

## 2017-11-07 ENCOUNTER — Encounter (INDEPENDENT_AMBULATORY_CARE_PROVIDER_SITE_OTHER): Payer: Medicare Other | Admitting: Diagnostic Neuroimaging

## 2017-11-07 ENCOUNTER — Ambulatory Visit (INDEPENDENT_AMBULATORY_CARE_PROVIDER_SITE_OTHER): Payer: Medicare Other | Admitting: Diagnostic Neuroimaging

## 2017-11-07 DIAGNOSIS — Z0289 Encounter for other administrative examinations: Secondary | ICD-10-CM

## 2017-11-07 DIAGNOSIS — R200 Anesthesia of skin: Secondary | ICD-10-CM | POA: Diagnosis not present

## 2017-11-08 ENCOUNTER — Other Ambulatory Visit: Payer: Self-pay | Admitting: Physician Assistant

## 2017-11-08 NOTE — Procedures (Signed)
[facility]  NCS (NERVE CONDUCTION STUDY) WITH EMG (ELECTROMYOGRAPHY) REPORT   STUDY DATE: 11/07/17 PATIENT NAME: Erik [name] DOB: 1945-06-01 MRN: [id number]  ORDERING CLINICIAN: Sherron Monday, PA-c  TECHNOLOGIST: Oneita Jolly ELECTROMYOGRAPHER: Earlean Polka. Penumalli, MD  CLINICAL INFORMATION: 72 year old male with numbness in toes.  FINDINGS: NERVE CONDUCTION STUDY: Bilateral peroneal and tibial motor responses are normal.  Right tibial F wave latency normal.  Bilateral sural and superficial peroneal sensory responses have normal peak latencies and slightly decreased amplitude.   NEEDLE ELECTROMYOGRAPHY:  Needle examination of left vastus medialis, tibials anterior and gastrocnemius is normal.   IMPRESSION:   This study demonstrates borderline decreased amplitudes of sensory responses in the lower extremities, which could be related to a mild underlying axonal sensory neuropathy versus technical artifact. Motor responses and needle examination are unremarkable.       INTERPRETING PHYSICIAN:  Penni Bombard, MD Certified in Neurology, Neurophysiology and Neuroimaging  [facility] [address], [place], [place] [address] [phone]  [facility]    Nerve / Sites Muscle Latency Ref. Amplitude Ref. Rel Amp Segments Distance Velocity Ref. Area    ms ms mV mV %  cm m/s m/s mVms  R Peroneal - EDB     Ankle EDB 5.1 ?6.5 3.2 ?2.0 100 Ankle - EDB 9   10.8     Fib head EDB 12.2  3.0  91.3 Fib head - Ankle 31 44 ?44 11.3     Pop fossa EDB 14.3  2.8  93.3 Pop fossa - Fib head 10 47 ?44 11.0         Pop fossa - Ankle      L Peroneal - EDB     Ankle EDB 5.3 ?6.5 2.4 ?2.0 100 Ankle - EDB 9   8.3     Fib head EDB 12.3  2.4  101 Fib head - Ankle 31 44 ?44 9.7     Pop fossa EDB 14.6  2.3  95.8 Pop fossa - Fib head 10 44 ?44 9.4         Pop fossa - Ankle      R Tibial - AH     Ankle AH 4.0 ?5.8 8.0 ?4.0 100 Ankle - AH 9   21.9   Pop fossa AH 13.2  4.8  60.6 Pop fossa - Ankle 38 41 ?41 14.6  L Tibial - AH     Ankle AH 4.4 ?5.8 6.8 ?4.0 100 Ankle - AH 9   15.4     Pop fossa AH 14.2  4.6  67.9 Pop fossa - Ankle 40 41 ?41 11.1             SNC    Nerve / Sites Rec. Site Peak Lat Ref.  Amp Ref. Segments Distance    ms ms V V  cm  R Sural - Ankle (Calf)     Calf Ankle 3.4 ?4.4 5 ?6 Calf - Ankle 14  L Sural - Ankle (Calf)     Calf Ankle 3.4 ?4.4 5 ?6 Calf - Ankle 14  R Superficial peroneal - Ankle     Lat leg Ankle 4.2 ?4.4 5 ?6 Lat leg - Ankle 14  L Superficial peroneal - Ankle     Lat leg Ankle 4.2 ?4.4 4 ?6 Lat leg - Ankle 14              F  Wave    Nerve F Lat Ref.  ms ms  R Tibial - AH 51.3 ?56.0       EMG full       EMG Summary Table    Spontaneous MUAP Recruitment  Muscle IA Fib PSW Fasc Other Amp Dur. Poly Pattern  L. Vastus medialis Normal None None None _______ Normal Normal Normal Normal  L. Tibialis anterior Normal None None None _______ Normal Normal Normal Normal  L. Gastrocnemius (Medial head) Normal None None None _______ Normal Normal Normal Normal

## 2017-11-13 ENCOUNTER — Telehealth: Payer: Self-pay | Admitting: *Deleted

## 2017-11-13 ENCOUNTER — Telehealth: Payer: Self-pay | Admitting: Family Medicine

## 2017-11-13 NOTE — Telephone Encounter (Signed)
Copied from [place] [id number]. Topic: Inquiry >> Nov 13, 2017  9:32 AM Vernona Rieger wrote: Reason for CRM:  Hilda Blades, [facility] wants to know if she needs to send a " EMG report " over for Dr Sarajane Jews.  Call back @ [phone]

## 2017-11-14 NOTE — Telephone Encounter (Signed)
Called and spoke with Hilda Blades and she stated that she did fax the report yesterday on this pt. Nothing further is needed.

## 2017-11-16 ENCOUNTER — Other Ambulatory Visit: Payer: Self-pay | Admitting: Cardiology

## 2017-11-18 NOTE — Telephone Encounter (Signed)
Pt last saw Dr Curt Bears 08/20/17, last labs 07/23/17 Cr 1.34, age 72, weight 118.3kg, CrCl 85.83, based on CrCl pt is on appropriate dosage of Xarelto 20mg  QD.  Will refill rx.

## 2017-12-02 ENCOUNTER — Encounter: Payer: Medicare Other | Admitting: Cardiology

## 2017-12-03 ENCOUNTER — Encounter: Payer: Medicare Other | Admitting: Cardiology

## 2017-12-10 ENCOUNTER — Ambulatory Visit (INDEPENDENT_AMBULATORY_CARE_PROVIDER_SITE_OTHER): Payer: Medicare Other | Admitting: Cardiology

## 2017-12-10 ENCOUNTER — Encounter: Payer: Self-pay | Admitting: Cardiology

## 2017-12-10 VITALS — BP 126/72 | HR 74 | Ht 72.0 in | Wt 259.0 lb

## 2017-12-10 DIAGNOSIS — I10 Essential (primary) hypertension: Secondary | ICD-10-CM | POA: Diagnosis not present

## 2017-12-10 DIAGNOSIS — I495 Sick sinus syndrome: Secondary | ICD-10-CM

## 2017-12-10 DIAGNOSIS — I4819 Other persistent atrial fibrillation: Secondary | ICD-10-CM | POA: Diagnosis not present

## 2017-12-10 DIAGNOSIS — Z23 Encounter for immunization: Secondary | ICD-10-CM | POA: Diagnosis not present

## 2017-12-10 NOTE — Progress Notes (Addendum)
Electrophysiology Office Note   Date:  12/10/2017   ID:  Erik [name], DOB 1945/10/24, MRN [id number]  PCP:  [name], MD  Cardiologist:  [name] Primary Electrophysiologist:  Constance Haw, MD    No chief complaint on file.    History of Present Illness: Erik [name] is a 72 y.o. male who presents today for electrophysiology evaluation.   He presents today for workup of his atrial fibrillation.  He has a history of nonobstructive coronary artery disease, hypertension, hyperlipidemia, hypothyroidism. In March 2016, he was diagnosed with atrial fibrillation with RVR in the setting of chest pain. He was loaded on tikosyn which was stopped as he went back in atrial fibrillation on 500 mcg twice a day..  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  He had an atrial fibrillation ablation 05/17/2017.  Today, denies symptoms of palpitations, chest pain, shortness of breath, orthopnea, PND, lower extremity edema, claudication, dizziness, presyncope, syncope, bleeding, or neurologic sequela. The patient is tolerating medications without difficulties.  Overall feeling well.  He had one episode of atrial fibrillation, less than 1% per device interrogation.  He was unaware that he was in atrial fibrillation.  He continues to have shortness of breath when he tries to exert himself.   Past Medical History:  Diagnosis Date  . Anxiety   . Atypical chest pain 06/26/2015  . BPH with urinary obstruction   . Carotid disease, bilateral ([place])    a. 09/2014 Carotid U/S: 1-39% bilat ICA stenosis.  . Chronic lower back pain   . CKD (chronic kidney disease), stage III ([place])   . Diverticulitis 12/2016  . Diverticulosis   . Gout    "couple days/year" (05/17/2017)  . History of kidney stones   . Hyperlipidemia   . Hypertension   . Hypertensive heart disease   . Hypothyroidism   . Internal hemorrhoids   . Long QT interval 06/26/2015  . Nephrolithiasis   .  Neuropathy 07/30/2017  . Non-obstructive CAD    a. 07/2002 Cath: LM 20, LAD 61m/d, LCX 50-82m, OM1 41m, RCA 10m, EF 60%; b. 05/2014 MV: low risk w/ small sized, mild intensity rev defect in apical/inferior/infsept area, nl EF->Med Rx.  . Panic attack   . Paroxysmal atrial fibrillation ([place])    a. Dx 04/2014; b. 05/2014 Echo: Ef 60-65%, no rwma, triv MR/TR, nl RV;  c. CHA2DS2VASc = 3-->was on eliquis, switched to xarelto 04/2015 2/2 cost; d. 05/2015 Tikosyn loaded w/ conversion to AF; e. 06/2015 QTc prolongation and bradycardia->tikosyn d/c'd, PPM placed, Amio started; f. 07/01/2015 In Aflutter @ clinic f/u.  [name] Paroxysmal atrial flutter ([place])    a. 06/2015 noted to be in rapid Aflutter in Afib clinic-->amio load continued.  . Persistent atrial fibrillation 06/21/2015  . Presence of permanent cardiac pacemaker   . Prolapsed internal hemorrhoids, grade 2-3 10/19/2016   LL Gr 3 and RP/RA Gr 2 LL banded 10/19/2016   . RLL pneumonia ([place]) 11/05/2012  . Sleep apnea    "can't tolerate mask" (05/17/2017)  . Tachy-brady syndrome ([place])    a. 06/27/2015 s/p SJM DC PPM (ser # @ [id number]).   Past Surgical History:  Procedure Laterality Date  . ATRIAL FIBRILLATION ABLATION N/A 05/17/2017   Procedure: ATRIAL FIBRILLATION ABLATION;  Surgeon: Constance Haw, MD;  Location: [facility];  Service: Cardiovascular;  Laterality: N/A;  . BACK SURGERY    . CARDIAC CATHETERIZATION  ~ 2000  . CLOSED REDUCTION HAND  FRACTURE Right 1984  . COLONOSCOPY  06/27/2015   per Dr. Henrene Pastor, internal hemorrhoids and diverticulae, repeat 10 yrs  . EP IMPLANTABLE DEVICE N/A 06/27/2015   Procedure: Pacemaker Implant;  Surgeon: Evans Lance, MD;  Location: [facility];  Service: Cardiovascular;  Laterality: N/A;  . FRACTURE SURGERY    . HEMORRHOID BANDING    . INSERT / REPLACE / REMOVE PACEMAKER    . LACERATION REPAIR Right 1984   "hand"  . POSTERIOR LUMBAR FUSION  1998   2 lumbar discs, Dr. Ellene Route; "ray cages"     Current  Outpatient Medications  Medication Sig Dispense Refill  . acetaminophen (TYLENOL) 500 MG tablet Take 1,000 mg by mouth every 6 (six) hours as needed for moderate pain or headache.     . diltiazem (CARDIZEM CD) 300 MG 24 hr capsule TAKE 1 CAPSULE BY MOUTH DAILY. 90 capsule 3  . diltiazem (CARDIZEM) 30 MG tablet Cardizem 30mg  -- take 1 tablet every 4 hours AS NEEDED for heart rate >100 as long as blood pressure >100. 90 tablet 1  . gabapentin (NEURONTIN) 300 MG capsule Take 1 capsule (300 mg total) by mouth 2 (two) times daily. 180 capsule 1  . ibuprofen (ADVIL,MOTRIN) 200 MG tablet Take 200 mg by mouth every 8 (eight) hours as needed for headache or moderate pain.     [name] levothyroxine (SYNTHROID, LEVOTHROID) 50 MCG tablet TAKE 1 TABLET (50 MCG TOTAL) BY MOUTH DAILY. 90 tablet 3  . losartan-hydrochlorothiazide (HYZAAR) 100-25 MG tablet TAKE 1 TABLET BY MOUTH EVERY DAY 90 tablet 2  . simvastatin (ZOCOR) 20 MG tablet TAKE 1 TABLET BY MOUTH EVERY DAY 90 tablet 2  . tamsulosin (FLOMAX) 0.4 MG CAPS capsule TAKE 1 CAPSULE BY MOUTH EVERY DAY 30 capsule 11  . XARELTO 20 MG TABS tablet TAKE 1 TABLET (20 MG TOTAL) BY MOUTH DAILY WITH SUPPER. 90 tablet 2   No current facility-administered medications for this visit.     Allergies:   Patient has no known allergies.   Social History:  The patient  reports that he quit smoking about 22 years ago. His smoking use included cigarettes. He has a 56.00 pack-year smoking history. He has never used smokeless tobacco. He reports that he drinks alcohol. He reports that he does not use drugs.   Family History:  The patient's family history includes Cancer in his paternal aunt; Dementia in his mother; Diabetes in his paternal grandmother; Stroke in his father and paternal aunt.   ROS:  Please see the history of present illness.   Otherwise, review of systems is positive for shortness of breath.   All other systems are reviewed and negative.   PHYSICAL EXAM: VS:  BP  126/72   Pulse 74   Ht 6' (1.829 m)   Wt 259 lb (117.5 kg)   SpO2 97%   BMI 35.13 kg/m  , BMI Body mass index is 35.13 kg/m. GEN: Well nourished, well developed, in no acute distress  HEENT: normal  Neck: no JVD, carotid bruits, or masses Cardiac: RRR; no murmurs, rubs, or gallops,no edema  Respiratory:  clear to auscultation bilaterally, normal work of breathing GI: soft, nontender, nondistended, + BS MS: no deformity or atrophy  Skin: warm and dry, device site well healed Neuro:  Strength and sensation are intact Psych: euthymic mood, full affect  EKG:  EKG is not ordered today. Personal review of the ekg ordered 08/20/17 shows A paced  Personal review of the device interrogation  today. Results in [place]: 07/23/2017: ALT 22; BUN 22; Creatinine, Ser 1.34; Hemoglobin 14.7; Platelets 211.0; Potassium 3.5; Sodium 142; TSH 3.64 08/20/2017: NT-Pro BNP 90    Lipid Panel     Component Value Date/Time   CHOL 136 05/18/2017 0514   TRIG 85 05/18/2017 0514   TRIG 203 (HH) 12/10/2005 0839   HDL 37 (L) 05/18/2017 0514   CHOLHDL 3.7 05/18/2017 0514   VLDL 17 05/18/2017 0514   LDLCALC 82 05/18/2017 0514   LDLDIRECT 104.9 06/09/2009 0919     Wt Readings from Last 3 Encounters:  12/10/17 259 lb (117.5 kg)  09/11/17 260 lb 12.8 oz (118.3 kg)  08/20/17 255 lb 3.2 oz (115.8 kg)      Other studies Reviewed: Additional studies/ records that were reviewed today include: TTE 11/10/15 Review of the above records today demonstrates:  - Left ventricle: The cavity size was normal. Wall thickness was   increased in a pattern of mild LVH. The estimated ejection   fraction was 55%. Wall motion was normal; there were no regional   wall motion abnormalities. Doppler parameters are consistent with   abnormal left ventricular relaxation (grade 1 diastolic   dysfunction). - Aortic valve: There was no stenosis. - Mitral valve: There was trivial regurgitation. - Left atrium: The  atrium was mildly dilated. - Right ventricle: The cavity size was normal. Pacer wire or   catheter noted in right ventricle. Systolic function was normal. - Pulmonary arteries: No complete TR doppler jet so unable to   estimate PA systolic pressure. - Inferior vena cava: The vessel was normal in size. The   respirophasic diameter changes were in the normal range (= 50%),   consistent with normal central venous pressure. - Pericardium, extracardiac: A trivial pericardial effusion was   identified posterior to the heart.  Myoview 06/04/14 Low risk stress nuclear study with a small sized, mild intensity reversible defect concering for mild apical-inferior/inferoseptal ischeima.  Clinical Correlation is recommended.  ASSESSMENT AND PLAN:  1.  Persistent atrial fibrillation: Post ablation 05/17/2017.  On Xarelto and diltiazem.  He has had minimal atrial arrhythmias since his ablation with less than 1% atrial fibrillation noted.  No changes.    This patients CHA2DS2-VASc Score and unadjusted Ischemic Stroke Rate (% per year) is equal to 2.2 % stroke rate/year from a score of 2  Above score calculated as 1 point each if present [CHF, HTN, DM, Vascular=MI/PAD/Aortic Plaque, Age if 65-74, or Male] Above score calculated as 2 points each if present [Age > 75, or Stroke/TIA/TE]   2. Hypertension: Controlled.  No changes.  3.  Sinus node dysfunction: Post Saint Jude dual-chamber pacemaker.  Device is functioning appropriately.  4. Shortness of breath: Is unclear as to the cause of his shortness of breath.  His grams are quite blunted.  He was walked at his current rate response settings and his heart rate went into the 120s.  It was thought this was too aggressive and thus his rate response was decreased.  Repeat walk, his heart rate was 86 and he was less short of breath.  Current medicines are reviewed at length with the patient today.   The patient does not have concerns regarding his  medicines.  The following changes were made today: None  Labs/ tests ordered today include:  No orders of the defined types were placed in this encounter.    Disposition:   FU with Will Camnitz 3 months  Signed, Will [employer]  Curt Bears, MD  12/10/2017 3:45 PM     [address] [place] [address] [phone] (office) [phone]

## 2017-12-10 NOTE — Patient Instructions (Signed)
Medication Instructions:  Your physician recommends that you continue on your current medications as directed. Please refer to the Current Medication list given to you today.  If you need a refill on your cardiac medications before your next appointment, please call your pharmacy.   Lab work: None ordered  Testing/Procedures: None ordered  Follow-Up: Remote monitoring is used to monitor your Pacemaker from home. This monitoring reduces the number of office visits required to check your device to one time per year. It allows [place] to keep an eye on the functioning of your device to ensure it is working properly. You are scheduled for a device check from home on 12/20/2017. You may send your transmission at any time that day. If you have a wireless device, the transmission will be sent automatically. After your physician reviews your transmission, you will receive a postcard with your next transmission date.   At [facility], you and your health needs are our priority.  As part of our continuing mission to provide you with exceptional heart care, we have created designated Provider Care Teams.  These Care Teams include your primary Cardiologist (physician) and Advanced Practice Providers (APPs -  Physician Assistants and Nurse Practitioners) who all work together to provide you with the care you need, when you need it. You will need a follow up appointment in 6 months.  Please call our office 2 months in advance to schedule this appointment.  You may see Will [name], MD or one of the following Advanced Practice Providers on your designated Care Team:   Chanetta Marshall, NP . Tommye Standard, PA-C  Thank you for choosing [facility]!!   Trinidad Curet, RN [phone]

## 2017-12-13 LAB — CUP PACEART INCLINIC DEVICE CHECK
Battery Remaining Longevity: 116 mo
Battery Voltage: 2.99 V
Date Time Interrogation Session: 20191015193723
Implantable Lead Implant Date: 20170501
Implantable Lead Location: 753859
Implantable Pulse Generator Implant Date: 20170501
Lead Channel Impedance Value: 462.5 Ohm
Lead Channel Pacing Threshold Amplitude: 0.75 V
Lead Channel Pacing Threshold Amplitude: 0.75 V
Lead Channel Pacing Threshold Amplitude: 0.875 V
Lead Channel Pacing Threshold Pulse Width: 0.5 ms
Lead Channel Setting Pacing Amplitude: 2.5 V
Lead Channel Setting Pacing Pulse Width: 0.5 ms
MDC IDC LEAD IMPLANT DT: 20170501
MDC IDC LEAD LOCATION: 753860
MDC IDC MSMT LEADCHNL RA PACING THRESHOLD PULSEWIDTH: 0.5 ms
MDC IDC MSMT LEADCHNL RA SENSING INTR AMPL: 3.3 mV
MDC IDC MSMT LEADCHNL RV IMPEDANCE VALUE: 562.5 Ohm
MDC IDC MSMT LEADCHNL RV PACING THRESHOLD PULSEWIDTH: 0.5 ms
MDC IDC MSMT LEADCHNL RV SENSING INTR AMPL: 12 mV
MDC IDC SET LEADCHNL RA PACING AMPLITUDE: 1.875
MDC IDC SET LEADCHNL RV SENSING SENSITIVITY: 2 mV
MDC IDC STAT BRADY RA PERCENT PACED: 91 %
MDC IDC STAT BRADY RV PERCENT PACED: 6.3 %
Pulse Gen Model: 2272
Pulse Gen Serial Number: 7897836

## 2017-12-20 ENCOUNTER — Telehealth: Payer: Self-pay

## 2017-12-20 ENCOUNTER — Ambulatory Visit (INDEPENDENT_AMBULATORY_CARE_PROVIDER_SITE_OTHER): Payer: Medicare Other | Admitting: *Deleted

## 2017-12-20 DIAGNOSIS — I495 Sick sinus syndrome: Secondary | ICD-10-CM

## 2017-12-20 NOTE — Telephone Encounter (Signed)
Attempted to confirm remote transmission with pt. No answer and was unable to leave a message.   

## 2017-12-23 ENCOUNTER — Encounter: Payer: Self-pay | Admitting: Cardiology

## 2017-12-23 NOTE — Progress Notes (Signed)
Remote pacemaker transmission.   

## 2017-12-30 ENCOUNTER — Encounter: Payer: Self-pay | Admitting: Family Medicine

## 2017-12-30 ENCOUNTER — Ambulatory Visit (INDEPENDENT_AMBULATORY_CARE_PROVIDER_SITE_OTHER): Payer: Medicare Other | Admitting: Family Medicine

## 2017-12-30 VITALS — BP 116/64 | HR 60 | Temp 98.3°F | Wt 264.1 lb

## 2017-12-30 DIAGNOSIS — G629 Polyneuropathy, unspecified: Secondary | ICD-10-CM | POA: Diagnosis not present

## 2017-12-30 MED ORDER — GABAPENTIN 600 MG PO TABS: 600.0000 mg | ORAL_TABLET | Freq: Two times a day (BID) | ORAL | 1 refills | Status: DC

## 2017-12-30 NOTE — Progress Notes (Signed)
   Subjective:    Patient ID: Erik [name], male    DOB: [birth year], 72 y.o.   MRN: [id number]  HPI Here to follow up on burning pains and numbness in the feet and lower legs. He saw [facility] and they felt this was a neuropathy and not an arthritic condition. They sent him to [facility] and they did NCT/EMG studies that confirmed neuropathy, but they did not suggest any treatment ideas. He is taking Gabapentin 300 mg bid and this seems to help.    Review of Systems  Constitutional: Negative.   Respiratory: Negative.   Cardiovascular: Negative.   Musculoskeletal: Positive for myalgias.  Neurological: Positive for numbness.       Objective:   Physical Exam  Constitutional: He is oriented to person, place, and time. He appears well-developed and well-nourished.  Cardiovascular: Normal rate, regular rhythm, normal heart sounds and intact distal pulses.  Pulmonary/Chest: Effort normal and breath sounds normal.  Neurological: He is alert and oriented to person, place, and time.          Assessment & Plan:  Neuropathy. We will increase the Gabapentin to 600 mg BID. Recheck prn. [name], MD

## 2018-01-10 ENCOUNTER — Other Ambulatory Visit: Payer: Self-pay | Admitting: Family Medicine

## 2018-01-10 DIAGNOSIS — I480 Paroxysmal atrial fibrillation: Secondary | ICD-10-CM

## 2018-01-10 LAB — CUP PACEART REMOTE DEVICE CHECK
Battery Remaining Longevity: 115 mo
Battery Remaining Percentage: 95.5 %
Battery Voltage: 3.01 V
Brady Statistic AS VP Percent: 1 %
Brady Statistic RA Percent Paced: 88 %
Brady Statistic RV Percent Paced: 1.6 %
Implantable Lead Implant Date: 20170501
Implantable Lead Location: 753860
Implantable Pulse Generator Implant Date: 20170501
Lead Channel Impedance Value: 450 Ohm
Lead Channel Pacing Threshold Amplitude: 1 V
Lead Channel Pacing Threshold Pulse Width: 0.5 ms
Lead Channel Sensing Intrinsic Amplitude: 12 mV
Lead Channel Setting Pacing Amplitude: 2 V
Lead Channel Setting Sensing Sensitivity: 2 mV
MDC IDC LEAD IMPLANT DT: 20170501
MDC IDC LEAD LOCATION: 753859
MDC IDC MSMT LEADCHNL RA SENSING INTR AMPL: 2.9 mV
MDC IDC MSMT LEADCHNL RV IMPEDANCE VALUE: 550 Ohm
MDC IDC MSMT LEADCHNL RV PACING THRESHOLD AMPLITUDE: 0.75 V
MDC IDC MSMT LEADCHNL RV PACING THRESHOLD PULSEWIDTH: 0.5 ms
MDC IDC SESS DTM: 20191028075353
MDC IDC SET LEADCHNL RV PACING AMPLITUDE: 2.5 V
MDC IDC SET LEADCHNL RV PACING PULSEWIDTH: 0.5 ms
MDC IDC STAT BRADY AP VP PERCENT: 1.6 %
MDC IDC STAT BRADY AP VS PERCENT: 87 %
MDC IDC STAT BRADY AS VS PERCENT: 11 %
Pulse Gen Model: 2272
Pulse Gen Serial Number: 7897836

## 2018-01-13 ENCOUNTER — Other Ambulatory Visit: Payer: Self-pay

## 2018-01-13 ENCOUNTER — Other Ambulatory Visit: Payer: Self-pay | Admitting: Family Medicine

## 2018-01-13 NOTE — Telephone Encounter (Signed)
Copied from [place] [id number]. Topic: Quick Communication - See Telephone Encounter >> Jan 13, 2018  9:51 AM Ahmed Prima L wrote: CRM for notification. See Telephone encounter for: 01/13/18.  Chris with [facility] [address] called and needs a refill on losartan-hydrochlorothiazide (HYZAAR) 100-25 MG tablet but he needs it spilt up due to the manufacturer being on back order. He needs Losartan 100mg  and HCTZ 25 mg sent in separately with 90 tablets and 3 refills.

## 2018-01-14 DIAGNOSIS — H2513 Age-related nuclear cataract, bilateral: Secondary | ICD-10-CM | POA: Diagnosis not present

## 2018-01-14 DIAGNOSIS — H43391 Other vitreous opacities, right eye: Secondary | ICD-10-CM | POA: Diagnosis not present

## 2018-01-14 MED ORDER — LOSARTAN POTASSIUM 100 MG PO TABS: 100.0000 mg | ORAL_TABLET | Freq: Every day | ORAL | 3 refills | Status: DC

## 2018-01-14 MED ORDER — HYDROCHLOROTHIAZIDE 25 MG PO TABS: 25.0000 mg | ORAL_TABLET | Freq: Every day | ORAL | 3 refills | Status: DC

## 2018-01-14 NOTE — Addendum Note (Signed)
Addended by: Gwenyth Ober R on: 01/14/2018 09:36 AM   Modules accepted: Orders

## 2018-01-14 NOTE — Telephone Encounter (Signed)
Rx sent in separate. No further action needed!

## 2018-01-24 ENCOUNTER — Emergency Department (HOSPITAL_COMMUNITY)
Admission: EM | Admit: 2018-01-24 | Discharge: 2018-01-24 | Disposition: A | Discharge status: Home / Self Care | Payer: Medicare Other | Attending: Emergency Medicine | Admitting: Emergency Medicine

## 2018-01-24 ENCOUNTER — Encounter (HOSPITAL_COMMUNITY): Payer: Self-pay

## 2018-01-24 ENCOUNTER — Emergency Department (HOSPITAL_COMMUNITY): Payer: Medicare Other

## 2018-01-24 ENCOUNTER — Other Ambulatory Visit: Payer: Self-pay

## 2018-01-24 DIAGNOSIS — R1031 Right lower quadrant pain: Secondary | ICD-10-CM | POA: Diagnosis not present

## 2018-01-24 DIAGNOSIS — N1330 Unspecified hydronephrosis: Secondary | ICD-10-CM | POA: Diagnosis not present

## 2018-01-24 DIAGNOSIS — I509 Heart failure, unspecified: Secondary | ICD-10-CM | POA: Insufficient documentation

## 2018-01-24 DIAGNOSIS — K449 Diaphragmatic hernia without obstruction or gangrene: Secondary | ICD-10-CM | POA: Diagnosis not present

## 2018-01-24 DIAGNOSIS — Z87891 Personal history of nicotine dependence: Secondary | ICD-10-CM | POA: Diagnosis not present

## 2018-01-24 DIAGNOSIS — I130 Hypertensive heart and chronic kidney disease with heart failure and stage 1 through stage 4 chronic kidney disease, or unspecified chronic kidney disease: Secondary | ICD-10-CM | POA: Diagnosis not present

## 2018-01-24 DIAGNOSIS — N132 Hydronephrosis with renal and ureteral calculous obstruction: Secondary | ICD-10-CM | POA: Diagnosis not present

## 2018-01-24 DIAGNOSIS — Z7901 Long term (current) use of anticoagulants: Secondary | ICD-10-CM | POA: Diagnosis not present

## 2018-01-24 DIAGNOSIS — E039 Hypothyroidism, unspecified: Secondary | ICD-10-CM | POA: Diagnosis not present

## 2018-01-24 DIAGNOSIS — N200 Calculus of kidney: Secondary | ICD-10-CM | POA: Diagnosis not present

## 2018-01-24 DIAGNOSIS — R319 Hematuria, unspecified: Secondary | ICD-10-CM | POA: Diagnosis not present

## 2018-01-24 DIAGNOSIS — Z79899 Other long term (current) drug therapy: Secondary | ICD-10-CM | POA: Diagnosis not present

## 2018-01-24 DIAGNOSIS — I2510 Atherosclerotic heart disease of native coronary artery without angina pectoris: Secondary | ICD-10-CM | POA: Insufficient documentation

## 2018-01-24 DIAGNOSIS — N183 Chronic kidney disease, stage 3 (moderate): Secondary | ICD-10-CM | POA: Diagnosis not present

## 2018-01-24 LAB — HEPATIC FUNCTION PANEL
ALT: 30 U/L (ref 0–44)
AST: 23 U/L (ref 15–41)
Albumin: 4.2 g/dL (ref 3.5–5.0)
Alkaline Phosphatase: 51 U/L (ref 38–126)
BILIRUBIN DIRECT: 0.2 mg/dL (ref 0.0–0.2)
Indirect Bilirubin: 0.7 mg/dL (ref 0.3–0.9)
Total Bilirubin: 0.9 mg/dL (ref 0.3–1.2)
Total Protein: 6.9 g/dL (ref 6.5–8.1)

## 2018-01-24 LAB — URINALYSIS, ROUTINE W REFLEX MICROSCOPIC
Bilirubin Urine: NEGATIVE
Glucose, UA: NEGATIVE mg/dL
Ketones, ur: NEGATIVE mg/dL
Leukocytes, UA: NEGATIVE
Nitrite: NEGATIVE
PROTEIN: 30 mg/dL — AB
RBC / HPF: >50 RBC/hpf — ABNORMAL HIGH (ref 0–5)
Specific Gravity, Urine: 1.02 (ref 1.005–1.030)
pH: 5 (ref 5.0–8.0)

## 2018-01-24 LAB — CBC WITH DIFFERENTIAL/PLATELET
Abs Immature Granulocytes: 0.09 10*3/uL — ABNORMAL HIGH (ref 0.00–0.07)
Basophils Absolute: 0.1 10*3/uL (ref 0.0–0.1)
Basophils Relative: 1 %
EOS ABS: 0.1 10*3/uL (ref 0.0–0.5)
Eosinophils Relative: 1 %
HCT: 43.5 % (ref 39.0–52.0)
Hemoglobin: 14.4 g/dL (ref 13.0–17.0)
Immature Granulocytes: 1 %
Lymphocytes Relative: 8 %
Lymphs Abs: 1 10*3/uL (ref 0.7–4.0)
MCH: 29.8 pg (ref 26.0–34.0)
MCHC: 33.1 g/dL (ref 30.0–36.0)
MCV: 90.1 fL (ref 80.0–100.0)
Monocytes Absolute: 1.1 10*3/uL — ABNORMAL HIGH (ref 0.1–1.0)
Monocytes Relative: 8 %
Neutro Abs: 10.7 10*3/uL — ABNORMAL HIGH (ref 1.7–7.7)
Neutrophils Relative %: 81 %
PLATELETS: 195 10*3/uL (ref 150–400)
RBC: 4.83 MIL/uL (ref 4.22–5.81)
RDW: 12.5 % (ref 11.5–15.5)
WBC: 13.1 10*3/uL — ABNORMAL HIGH (ref 4.0–10.5)
nRBC: 0 % (ref 0.0–0.2)

## 2018-01-24 LAB — BASIC METABOLIC PANEL
Anion gap: 8 (ref 5–15)
BUN: 29 mg/dL — ABNORMAL HIGH (ref 8–23)
CO2: 25 mmol/L (ref 22–32)
Calcium: 10.1 mg/dL (ref 8.9–10.3)
Chloride: 108 mmol/L (ref 98–111)
Creatinine, Ser: 1.7 mg/dL — ABNORMAL HIGH (ref 0.61–1.24)
GFR calc Af Amer: 46 mL/min — ABNORMAL LOW (ref >60)
GFR, EST NON AFRICAN AMERICAN: 40 mL/min — AB (ref >60)
Glucose, Bld: 105 mg/dL — ABNORMAL HIGH (ref 70–99)
POTASSIUM: 3.8 mmol/L (ref 3.5–5.1)
Sodium: 141 mmol/L (ref 135–145)

## 2018-01-24 MED ORDER — HYDROCODONE-ACETAMINOPHEN 5-325 MG PO TABS: 1.0000 | ORAL_TABLET | Freq: Four times a day (QID) | ORAL | 0 refills | Status: DC | PRN

## 2018-01-24 MED ORDER — MORPHINE SULFATE (PF) 4 MG/ML IV SOLN
4.0000 mg | Freq: Once | INTRAVENOUS | Status: AC
  Administered 2018-01-24: 4 mg via INTRAVENOUS
  Filled 2018-01-24: qty 1

## 2018-01-24 MED ORDER — SODIUM CHLORIDE 0.9 % IV BOLUS
500.0000 mL | Freq: Once | INTRAVENOUS | Status: AC
  Administered 2018-01-24: 500 mL via INTRAVENOUS

## 2018-01-24 MED ORDER — IOPAMIDOL (ISOVUE-300) INJECTION 61%
INTRAVENOUS | Status: AC
  Filled 2018-01-24: qty 100

## 2018-01-24 MED ORDER — IOPAMIDOL (ISOVUE-300) INJECTION 61%
100.0000 mL | Freq: Once | INTRAVENOUS | Status: AC | PRN
  Administered 2018-01-24: 80 mL via INTRAVENOUS

## 2018-01-24 NOTE — ED Triage Notes (Signed)
Patient states he has a known kidney stone. Patient c/o right flank pain and hematuria at 1200 today. Patient states he has had hematuria x several days.

## 2018-01-24 NOTE — ED Provider Notes (Signed)
[facility] DEPT Provider Note   CSN: [id number] Arrival date & time: 01/24/18  1458     History   Chief Complaint Chief Complaint  Patient presents with  . Hematuria  . Flank Pain    HPI Erik [name] is a 72 y.o. male presenting for evaluation of right lower quadrant abdominal pain and right flank pain.  Patient states the past couple days, he has been having urinary symptoms such as hematuria.  Today, he had acute right lower quadrant abdominal pain.  He took some Pepcid without improvement of symptoms.  He then developed right flank pain.  Patient states this feels similar 8s had previous kidney stones, although his last kidney stone was 5 years ago.  He reports associated nausea, no vomiting.  He denies fevers, chills, chest pain, shortness of breath, left-sided abdominal pain, or abnormal bowel movements.  He denies dysuria or urinary frequency.  He is on Xarelto, which he has been taking as prescribed.  He has not taken anything for pain including Tylenol or ibuprofen. Pt states he has not had a follow-up with urology in many years.  Additional history obtained from chart review, patient with a history of CKD, diverticulosis, A. fib on Xarelto, hypertension, previous kidney stones. Last CT abd was in 2003, which showed a kidney stone.   HPI  Past Medical History:  Diagnosis Date  . Anxiety   . Atypical chest pain 06/26/2015  . BPH with urinary obstruction   . Carotid disease, bilateral ([place])    a. 09/2014 Carotid U/S: 1-39% bilat ICA stenosis.  . Chronic lower back pain   . CKD (chronic kidney disease), stage III ([place])   . Diverticulitis 12/2016  . Diverticulosis   . Gout    "couple days/year" (05/17/2017)  . History of kidney stones   . Hyperlipidemia   . Hypertension   . Hypertensive heart disease   . Hypothyroidism   . Internal hemorrhoids   . Long QT interval 06/26/2015  . Nephrolithiasis   . Neuropathy 07/30/2017  .  Non-obstructive CAD    a. 07/2002 Cath: LM 20, LAD 61m/d, LCX 50-25m, OM1 80m, RCA 25m, EF 60%; b. 05/2014 MV: low risk w/ small sized, mild intensity rev defect in apical/inferior/infsept area, nl EF->Med Rx.  . Panic attack   . Paroxysmal atrial fibrillation ([place])    a. Dx 04/2014; b. 05/2014 Echo: Ef 60-65%, no rwma, triv MR/TR, nl RV;  c. CHA2DS2VASc = 3-->was on eliquis, switched to xarelto 04/2015 2/2 cost; d. 05/2015 Tikosyn loaded w/ conversion to AF; e. 06/2015 QTc prolongation and bradycardia->tikosyn d/c'd, PPM placed, Amio started; f. 07/01/2015 In Aflutter @ clinic f/u.  [name] Paroxysmal atrial flutter ([place])    a. 06/2015 noted to be in rapid Aflutter in Afib clinic-->amio load continued.  . Persistent atrial fibrillation 06/21/2015  . Presence of permanent cardiac pacemaker   . Prolapsed internal hemorrhoids, grade 2-3 10/19/2016   LL Gr 3 and RP/RA Gr 2 LL banded 10/19/2016   . RLL pneumonia ([place]) 11/05/2012  . Sleep apnea    "can't tolerate mask" (05/17/2017)  . Tachy-brady syndrome ([place])    a. 06/27/2015 s/p SJM DC PPM (ser # @ [id number]).    Patient Active Problem List   Diagnosis Date Noted  . Neuropathy 07/30/2017  . Prolapsed internal hemorrhoids, grade 2-3 10/19/2016  . Constipation 09/29/2015  . CKD (chronic kidney disease), stage III ([place])   . Dizziness 06/26/2015  . Long QT interval 06/26/2015  .  Visit for monitoring Tikosyn therapy 06/26/2015  . Atypical chest pain 06/26/2015  . Persistent atrial fibrillation 06/21/2015  . Hypertensive heart disease   . Hypothyroidism   . Non-obstructive CAD   . Carotid disease, bilateral ([facility])   . OBESITY 09/20/2008  . Hyperlipidemia 12/17/2005  . Essential hypertension 12/17/2005    Past Surgical History:  Procedure Laterality Date  . ATRIAL FIBRILLATION ABLATION N/A 05/17/2017   Procedure: ATRIAL FIBRILLATION ABLATION;  Surgeon: Constance Haw, MD;  Location: [facility];  Service: Cardiovascular;  Laterality: N/A;  . BACK  SURGERY    . CARDIAC CATHETERIZATION  ~ 2000  . CLOSED REDUCTION HAND FRACTURE Right 1984  . COLONOSCOPY  06/27/2015   per Dr. Henrene Pastor, internal hemorrhoids and diverticulae, repeat 10 yrs  . EP IMPLANTABLE DEVICE N/A 06/27/2015   Procedure: Pacemaker Implant;  Surgeon: Evans Lance, MD;  Location: [facility];  Service: Cardiovascular;  Laterality: N/A;  . FRACTURE SURGERY    . HEMORRHOID BANDING    . INSERT / REPLACE / REMOVE PACEMAKER    . LACERATION REPAIR Right 1984   "hand"  . POSTERIOR LUMBAR FUSION  1998   2 lumbar discs, Dr. Ellene Route; "ray cages"        Home Medications    Prior to Admission medications   Medication Sig Start Date End Date Taking? Authorizing Provider  diltiazem (CARDIZEM CD) 300 MG 24 hr capsule TAKE 1 CAPSULE BY MOUTH DAILY. 07/01/17  Yes Camnitz, Will Hassell Done, MD  gabapentin (NEURONTIN) 600 MG tablet Take 1 tablet (600 mg total) by mouth 2 (two) times daily. 12/30/17  Yes [name], MD  hydrochlorothiazide (HYDRODIURIL) 25 MG tablet Take 1 tablet (25 mg total) by mouth daily. 01/14/18  Yes [name], MD  ibuprofen (ADVIL,MOTRIN) 200 MG tablet Take 200 mg by mouth every 8 (eight) hours as needed for headache or moderate pain.    Yes [provider]  levothyroxine (SYNTHROID, LEVOTHROID) 50 MCG tablet TAKE 1 TABLET (50 MCG TOTAL) BY MOUTH DAILY. 08/19/17  Yes [name], MD  losartan-hydrochlorothiazide [facility]) 100-25 MG tablet TAKE 1 TABLET BY MOUTH EVERY DAY 04/15/17  Yes [name], MD  oxymetazoline (AFRIN) 0.05 % nasal spray Place 2 sprays into both nostrils at bedtime as needed for congestion.   Yes [provider]  simvastatin (ZOCOR) 20 MG tablet TAKE 1 TABLET BY MOUTH EVERY DAY 11/08/17  Yes Bhagat, Bhavinkumar, PA  tamsulosin (FLOMAX) 0.4 MG CAPS capsule TAKE 1 CAPSULE BY MOUTH EVERY DAY 05/28/17  Yes [name], MD  XARELTO 20 MG TABS tablet TAKE 1 TABLET (20 MG TOTAL) BY MOUTH DAILY WITH SUPPER. Patient taking  differently: Take 20 mg by mouth daily with supper.  11/18/17  Yes Camnitz, Will Hassell Done, MD  diltiazem (CARDIZEM) 30 MG tablet Cardizem 30mg  -- take 1 tablet every 4 hours AS NEEDED for heart rate >100 as long as blood pressure >100. 06/27/16   Camnitz, Ocie Doyne, MD  HYDROcodone-acetaminophen (NORCO/VICODIN) 5-325 MG tablet Take 1 tablet by mouth every 6 (six) hours as needed. 01/24/18   Myan Suit, PA-C  losartan (COZAAR) 100 MG tablet Take 1 tablet (100 mg total) by mouth daily. Patient not taking: Reported on 01/24/2018 01/14/18   [name], MD    Family History Family History  Problem Relation Age of Onset  . Dementia Mother   . Stroke Father   . Diabetes Paternal Grandmother   . Stroke Paternal Aunt  x 2  . Cancer Paternal Aunt        type unknown  . Colon cancer Neg Hx   . Stomach cancer Neg Hx     Social History Social History   Tobacco Use  . Smoking status: Former Smoker    Packs/day: 2.00    Years: 28.00    Pack years: 56.00    Types: Cigarettes    Last attempt to quit: 02/27/1995    Years since quitting: 22.9  . Smokeless tobacco: Never Used  Substance Use Topics  . Alcohol use: Yes    Alcohol/week: 0.0 standard drinks    Comment: couple times a month  . Drug use: No     Allergies   Patient has no known allergies.   Review of Systems Review of Systems  Gastrointestinal: Positive for abdominal pain.  Genitourinary: Positive for flank pain and hematuria.  All other systems reviewed and are negative.    Physical Exam Updated Vital Signs BP (!) 152/72 (BP Location: Left Arm)   Pulse 65   Temp (!) 97.5 F (36.4 C) (Oral)   Resp 16   Ht 5\' 11"  (1.803 m)   Wt 117.9 kg   SpO2 95%   BMI 36.26 kg/m   Physical Exam  Constitutional: He is oriented to person, place, and time. He appears well-developed and well-nourished.  Elderly male who appears uncomfortable due to pain, but appears nontoxic  HENT:  Head: Normocephalic and  atraumatic.  Eyes: Pupils are equal, round, and reactive to light. Conjunctivae and EOM are normal.  Neck: Normal range of motion. Neck supple.  Cardiovascular: Normal rate, regular rhythm and intact distal pulses.  Pulmonary/Chest: Effort normal and breath sounds normal. No respiratory distress. He has no wheezes.  Abdominal: Soft. He exhibits no distension and no mass. There is tenderness. There is no rebound and no guarding.  Tenderness palpation of right lower quadrant and right flank.  No tenderness palpation of the left-sided abdomen.  No rigidity, guarding, distention.  Negative rebound.  Musculoskeletal: Normal range of motion.  Neurological: He is alert and oriented to person, place, and time.  Skin: Skin is warm and dry. Capillary refill takes less than 2 seconds.  Psychiatric: He has a normal mood and affect.  Nursing note and vitals reviewed.    ED Treatments / Results  Labs (all labs ordered are listed, but only abnormal results are displayed) Labs Reviewed  URINALYSIS, ROUTINE W REFLEX MICROSCOPIC - Abnormal; Notable for the following components:      Result Value   APPearance CLOUDY (*)    Hgb urine dipstick LARGE (*)    Protein, ur 30 (*)    RBC / HPF >50 (*)    Bacteria, UA RARE (*)    All other components within normal limits  CBC WITH DIFFERENTIAL/PLATELET - Abnormal; Notable for the following components:   WBC 13.1 (*)    Neutro Abs 10.7 (*)    Monocytes Absolute 1.1 (*)    Abs Immature Granulocytes 0.09 (*)    All other components within normal limits  BASIC METABOLIC PANEL - Abnormal; Notable for the following components:   Glucose, Bld 105 (*)    BUN 29 (*)    Creatinine, Ser 1.70 (*)    GFR calc non Af Amer 40 (*)    GFR calc Af Amer 46 (*)    All other components within normal limits  URINE CULTURE  HEPATIC FUNCTION PANEL    EKG None  Radiology Ct Abdomen  Pelvis W Contrast  Result Date: 01/24/2018 CLINICAL DATA:  Right flank pain beginning  at noon today. EXAM: CT ABDOMEN AND PELVIS WITH CONTRAST TECHNIQUE: Multidetector CT imaging of the abdomen and pelvis was performed using the standard protocol following bolus administration of intravenous contrast. CONTRAST:  80 mL ISOVUE-300 IOPAMIDOL (ISOVUE-300) INJECTION 61% COMPARISON:  None. FINDINGS: Lower chest: Mild dependent atelectasis is seen in the lung bases. No pleural or pericardial effusion. Hepatobiliary: No focal liver abnormality is seen. No gallstones, gallbladder wall thickening, or biliary dilatation. Pancreas: Unremarkable. No pancreatic ductal dilatation or surrounding inflammatory changes. Spleen: Normal in size without focal abnormality. Adrenals/Urinary Tract: The patient has mild to moderate right hydronephrosis due to a 0.5 cm right ureteral stone just below the pelvic brim. Multiple small nonobstructing stones are seen in both kidneys. The urinary bladder is unremarkable. The adrenal glands appear normal. Stomach/Bowel: A few sigmoid diverticula are identified. No diverticulitis. The stomach, small bowel and appendix appear normal. Vascular/Lymphatic: Aortic atherosclerosis. No enlarged abdominal or pelvic lymph nodes. Reproductive: Prostate is unremarkable. Other: Small fat containing right inguinal hernia is seen. The patient also has a small fat containing umbilical hernia. Musculoskeletal: No acute bony abnormality. The patient is status post L4-5 and L5-S1 discectomy. Degenerative disc disease L1-2 and L2-3 noted. IMPRESSION: Mild to moderate right hydronephrosis due to a 0.5 cm right ureteral stone just below the pelvic brim. The patient has multiple small nonobstructing stones in both kidneys. Mild sigmoid diverticulosis without diverticulitis. Atherosclerosis. Small fat containing right inguinal and umbilical hernias. Electronically Signed   By: Inge Rise M.D.   On: 01/24/2018 19:19    Procedures Procedures (including critical care time)  Medications Ordered in  ED Medications  iopamidol (ISOVUE-300) 61 % injection (has no administration in time range)  sodium chloride 0.9 % bolus 500 mL (0 mLs Intravenous Stopped 01/24/18 1853)  morphine 4 MG/ML injection 4 mg (4 mg Intravenous Given 01/24/18 1757)  iopamidol (ISOVUE-300) 61 % injection 100 mL (80 mLs Intravenous Contrast Given 01/24/18 1859)     Initial Impression / Assessment and Plan / ED Course  I have reviewed the triage vital signs and the nursing notes.  Pertinent labs & imaging results that were available during my care of the patient were reviewed by me and considered in my medical decision making (see chart for details).     Patient presenting for evaluation of right lower quadrant abdominal pain and right-sided flank pain.  Additionally, has hematuria.  Physical exam shows elderly male who appears nontoxic.  He is afebrile not tachycardic.  Will obtain labs, urine, and CT abdomen pelvis.  Consider kidney stone versus pyelo versus UTI versus appendicitis.  Morphine and fluids for symptom control.  Labs with mild leukocytosis at 13.  Urine with blood, but no UTI.  Creatinine mildly increased at 1.7, baseline 1.4.  LFTs reassuring.  CT abdomen pelvis pending.  CT abdomen pelvis shows right kidney stone at the level of the pelvic brim.  Mild hydronephrosis.  No sign of appendicitis or other intra-abdominal infection.  On reassessment, patient reports pain is controlled.  Case discussed with attending, Dr. Ralene Bathe evaluated the patient.  Will consult with urology considering patient's age, mild increase in creatinine, and concurrent Xarelto use.  Discussed with Dr. Diona Fanti, who agrees that patient is safe for discharge, and recommends follow-up in the office next week.  Patient is to continue his Xarelto.  Discussed findings and plan with patient.  Discussed symptom control with pain medication.  Discussed  importance of hydration, and follow-up with urology.  At this time, patient appears safe  for discharge.  Return precautions given.  Patient states he understands and agrees to plan.   Final Clinical Impressions(s) / ED Diagnoses   Final diagnoses:  Right kidney stone  Hydronephrosis of right kidney    ED Discharge Orders         Ordered    HYDROcodone-acetaminophen (NORCO/VICODIN) 5-325 MG tablet  Every 6 hours PRN,   Status:  Discontinued     01/24/18 1958    HYDROcodone-acetaminophen (NORCO/VICODIN) 5-325 MG tablet  Every 6 hours PRN     01/24/18 [place], Kadejah Sandiford, PA-C 01/24/18 2300    Quintella Reichert, MD 01/25/18 1504

## 2018-01-24 NOTE — Discharge Instructions (Addendum)
Make sure you stay well hydrated with water.  Use tylenol for mild to moderate pain, use norco as needed for severe or breakthrough pain.  Follow up with urology for further evaluation of your kidneys. Return to the ER if you develop fevers, severe pain uncontrolled with medication, persistent vomiting, inability to urinate, or with any new or worsening symptoms.

## 2018-01-25 LAB — URINE CULTURE: Culture: NO GROWTH

## 2018-01-28 DIAGNOSIS — N202 Calculus of kidney with calculus of ureter: Secondary | ICD-10-CM | POA: Diagnosis not present

## 2018-01-29 ENCOUNTER — Other Ambulatory Visit: Payer: Self-pay | Admitting: Urology

## 2018-02-11 NOTE — Patient Instructions (Signed)
JULIES CARMICKLE  02/11/2018   Your procedure is scheduled on: 02-14-18  Report to [facility] Main  Entrance  Report to admitting at       200 PM    Call this number if you have problems the morning of surgery [phone]    Remember: Do not eat food  :After Midnight. You may have clear liquids until 1000 am then nothing by mouth  BRUSH YOUR TEETH MORNING OF SURGERY AND RINSE YOUR MOUTH OUT, NO CHEWING GUM CANDY OR MINTS.      CLEAR LIQUID DIET   Foods Allowed                                                                     Foods Excluded  Coffee and tea, regular and decaf                             liquids that you cannot  Plain Jell-O in any flavor                                             see through such as: Fruit ices (not with fruit pulp)                                     milk, soups, orange juice  Iced Popsicles                                    All solid food Carbonated beverages, regular and diet                                    Cranberry, grape and apple juices Sports drinks like Gatorade Lightly seasoned clear broth or consume(fat free) Sugar, honey syrup  Sample Menu Breakfast                                Lunch                                     Supper Cranberry juice                    Beef broth                            Chicken broth Jell-O                                     Grape juice  Apple juice Coffee or tea                        Jell-O                                      Popsicle                                                Coffee or tea                        Coffee or tea  _____________________________________________________________________    Take these medicines the morning of surgery with A SIP OF WATER: Flomax, zocor, levothyroxine, gabapentin, dilitiazem                                You may not have any metal on your body including hair pins and              piercings  Do  not wear jewelry,  lotions, powders or perfumes, deodorant                      Men may shave face and neck.   Do not bring valuables to the hospital. [place].  Contacts, dentures or bridgework may not be worn into surgery.       Patients discharged the day of surgery will not be allowed to drive home.  Name and phone number of your driver:  Special Instructions: N/A              Please read over the following fact sheets you were given: _____________________________________________________________________             [facility] - Preparing for Surgery Before surgery, you can play an important role.  Because skin is not sterile, your skin needs to be as free of germs as possible.  You can reduce the number of germs on your skin by washing with CHG (chlorahexidine gluconate) soap before surgery.  CHG is an antiseptic cleaner which kills germs and bonds with the skin to continue killing germs even after washing. Please DO NOT use if you have an allergy to CHG or antibacterial soaps.  If your skin becomes reddened/irritated stop using the CHG and inform your nurse when you arrive at Short Stay. Do not shave (including legs and underarms) for at least 48 hours prior to the first CHG shower.  You may shave your face/neck. Please follow these instructions carefully:  1.  Shower with CHG Soap the night before surgery and the  morning of Surgery.  2.  If you choose to wash your hair, wash your hair first as usual with your  normal  shampoo.  3.  After you shampoo, rinse your hair and body thoroughly to remove the  shampoo.                           4.  Use CHG as you would any other liquid soap.  You  can apply chg directly  to the skin and wash                       Gently with a scrungie or clean washcloth.  5.  Apply the CHG Soap to your body ONLY FROM THE NECK DOWN.   Do not use on face/ open                           Wound or open sores.  Avoid contact with eyes, ears mouth and genitals (private parts).                       Wash face,  Genitals (private parts) with your normal soap.             6.  Wash thoroughly, paying special attention to the area where your surgery  will be performed.  7.  Thoroughly rinse your body with warm water from the neck down.  8.  DO NOT shower/wash with your normal soap after using and rinsing off  the CHG Soap.                9.  Pat yourself dry with a clean towel.            10.  Wear clean pajamas.            11.  Place clean sheets on your bed the night of your first shower and do not  sleep with pets. Day of Surgery : Do not apply any lotions/deodorants the morning of surgery.  Please wear clean clothes to the hospital/surgery center.  FAILURE TO FOLLOW THESE INSTRUCTIONS MAY RESULT IN THE CANCELLATION OF YOUR SURGERY PATIENT SIGNATURE_________________________________  NURSE SIGNATURE__________________________________  ________________________________________________________________________

## 2018-02-11 NOTE — Progress Notes (Addendum)
Echo 10-31-15 epic EKG 08-20-17 epic  LOV 12-10-17 epic card  Dr. Curt Bears Last device check 01-10-18 epic

## 2018-02-12 ENCOUNTER — Other Ambulatory Visit: Payer: Self-pay

## 2018-02-12 ENCOUNTER — Encounter (HOSPITAL_COMMUNITY): Payer: Self-pay

## 2018-02-12 ENCOUNTER — Encounter (HOSPITAL_COMMUNITY)
Admission: RE | Admit: 2018-02-12 | Discharge: 2018-02-12 | Disposition: A | Discharge status: Home / Self Care | Payer: Medicare Other | Source: Ambulatory Visit | Attending: Urology | Admitting: Urology

## 2018-02-12 DIAGNOSIS — G8929 Other chronic pain: Secondary | ICD-10-CM | POA: Diagnosis not present

## 2018-02-12 DIAGNOSIS — N200 Calculus of kidney: Secondary | ICD-10-CM | POA: Diagnosis present

## 2018-02-12 DIAGNOSIS — E669 Obesity, unspecified: Secondary | ICD-10-CM | POA: Diagnosis not present

## 2018-02-12 DIAGNOSIS — Z87442 Personal history of urinary calculi: Secondary | ICD-10-CM | POA: Diagnosis not present

## 2018-02-12 DIAGNOSIS — E785 Hyperlipidemia, unspecified: Secondary | ICD-10-CM | POA: Diagnosis not present

## 2018-02-12 DIAGNOSIS — M109 Gout, unspecified: Secondary | ICD-10-CM | POA: Diagnosis not present

## 2018-02-12 DIAGNOSIS — N202 Calculus of kidney with calculus of ureter: Secondary | ICD-10-CM | POA: Diagnosis not present

## 2018-02-12 DIAGNOSIS — Z833 Family history of diabetes mellitus: Secondary | ICD-10-CM | POA: Diagnosis not present

## 2018-02-12 DIAGNOSIS — Z823 Family history of stroke: Secondary | ICD-10-CM | POA: Diagnosis not present

## 2018-02-12 DIAGNOSIS — I1310 Hypertensive heart and chronic kidney disease without heart failure, with stage 1 through stage 4 chronic kidney disease, or unspecified chronic kidney disease: Secondary | ICD-10-CM | POA: Diagnosis not present

## 2018-02-12 DIAGNOSIS — Z6836 Body mass index (BMI) 36.0-36.9, adult: Secondary | ICD-10-CM | POA: Diagnosis not present

## 2018-02-12 DIAGNOSIS — Z01812 Encounter for preprocedural laboratory examination: Secondary | ICD-10-CM | POA: Insufficient documentation

## 2018-02-12 DIAGNOSIS — F419 Anxiety disorder, unspecified: Secondary | ICD-10-CM | POA: Diagnosis not present

## 2018-02-12 DIAGNOSIS — I495 Sick sinus syndrome: Secondary | ICD-10-CM | POA: Diagnosis not present

## 2018-02-12 DIAGNOSIS — I2510 Atherosclerotic heart disease of native coronary artery without angina pectoris: Secondary | ICD-10-CM | POA: Diagnosis not present

## 2018-02-12 DIAGNOSIS — I480 Paroxysmal atrial fibrillation: Secondary | ICD-10-CM | POA: Diagnosis not present

## 2018-02-12 DIAGNOSIS — Z950 Presence of cardiac pacemaker: Secondary | ICD-10-CM | POA: Diagnosis not present

## 2018-02-12 DIAGNOSIS — Z809 Family history of malignant neoplasm, unspecified: Secondary | ICD-10-CM | POA: Diagnosis not present

## 2018-02-12 DIAGNOSIS — Z87891 Personal history of nicotine dependence: Secondary | ICD-10-CM | POA: Diagnosis not present

## 2018-02-12 DIAGNOSIS — G4730 Sleep apnea, unspecified: Secondary | ICD-10-CM | POA: Diagnosis not present

## 2018-02-12 DIAGNOSIS — M545 Low back pain: Secondary | ICD-10-CM | POA: Diagnosis not present

## 2018-02-12 DIAGNOSIS — N183 Chronic kidney disease, stage 3 (moderate): Secondary | ICD-10-CM | POA: Diagnosis not present

## 2018-02-12 DIAGNOSIS — E039 Hypothyroidism, unspecified: Secondary | ICD-10-CM | POA: Diagnosis not present

## 2018-02-12 HISTORY — DX: Dyspnea, unspecified: R06.00

## 2018-02-12 HISTORY — DX: Unspecified osteoarthritis, unspecified site: M19.90

## 2018-02-12 LAB — CBC
HCT: 43.1 % (ref 39.0–52.0)
Hemoglobin: 14.2 g/dL (ref 13.0–17.0)
MCH: 29 pg (ref 26.0–34.0)
MCHC: 32.9 g/dL (ref 30.0–36.0)
MCV: 88.1 fL (ref 80.0–100.0)
Platelets: 213 10*3/uL (ref 150–400)
RBC: 4.89 MIL/uL (ref 4.22–5.81)
RDW: 12.4 % (ref 11.5–15.5)
WBC: 5.8 10*3/uL (ref 4.0–10.5)
nRBC: 0 % (ref 0.0–0.2)

## 2018-02-12 LAB — BASIC METABOLIC PANEL
Anion gap: 8 (ref 5–15)
BUN: 21 mg/dL (ref 8–23)
CHLORIDE: 107 mmol/L (ref 98–111)
CO2: 27 mmol/L (ref 22–32)
Calcium: 9.2 mg/dL (ref 8.9–10.3)
Creatinine, Ser: 1.41 mg/dL — ABNORMAL HIGH (ref 0.61–1.24)
GFR calc Af Amer: 58 mL/min — ABNORMAL LOW (ref >60)
GFR calc non Af Amer: 50 mL/min — ABNORMAL LOW (ref >60)
Glucose, Bld: 98 mg/dL (ref 70–99)
Potassium: 3.8 mmol/L (ref 3.5–5.1)
SODIUM: 142 mmol/L (ref 135–145)

## 2018-02-13 MED ORDER — GENTAMICIN SULFATE 40 MG/ML IJ SOLN
5.0000 mg/kg | INTRAVENOUS | Status: AC
  Administered 2018-02-14: 462.4 mg via INTRAVENOUS
  Filled 2018-02-13 (×2): qty 11.5

## 2018-02-14 ENCOUNTER — Encounter (HOSPITAL_COMMUNITY): Payer: Self-pay | Admitting: *Deleted

## 2018-02-14 ENCOUNTER — Ambulatory Visit (HOSPITAL_COMMUNITY): Payer: Medicare Other

## 2018-02-14 ENCOUNTER — Ambulatory Visit (HOSPITAL_COMMUNITY): Payer: Medicare Other | Admitting: Anesthesiology

## 2018-02-14 ENCOUNTER — Telehealth: Payer: Self-pay | Admitting: *Deleted

## 2018-02-14 ENCOUNTER — Ambulatory Visit (HOSPITAL_COMMUNITY)
Admission: RE | Admit: 2018-02-14 | Discharge: 2018-02-14 | Disposition: A | Discharge status: Home / Self Care | Payer: Medicare Other | Attending: Urology | Admitting: Urology

## 2018-02-14 ENCOUNTER — Telehealth: Payer: Self-pay | Admitting: Cardiology

## 2018-02-14 ENCOUNTER — Encounter (HOSPITAL_COMMUNITY)
Admission: RE | Disposition: A | Discharge status: Home / Self Care | Payer: Self-pay | Source: Home / Self Care | Attending: Urology

## 2018-02-14 DIAGNOSIS — Z950 Presence of cardiac pacemaker: Secondary | ICD-10-CM | POA: Insufficient documentation

## 2018-02-14 DIAGNOSIS — M109 Gout, unspecified: Secondary | ICD-10-CM | POA: Diagnosis not present

## 2018-02-14 DIAGNOSIS — G8929 Other chronic pain: Secondary | ICD-10-CM | POA: Diagnosis not present

## 2018-02-14 DIAGNOSIS — Z6836 Body mass index (BMI) 36.0-36.9, adult: Secondary | ICD-10-CM | POA: Diagnosis not present

## 2018-02-14 DIAGNOSIS — Z87442 Personal history of urinary calculi: Secondary | ICD-10-CM | POA: Insufficient documentation

## 2018-02-14 DIAGNOSIS — Z87891 Personal history of nicotine dependence: Secondary | ICD-10-CM | POA: Insufficient documentation

## 2018-02-14 DIAGNOSIS — I480 Paroxysmal atrial fibrillation: Secondary | ICD-10-CM | POA: Insufficient documentation

## 2018-02-14 DIAGNOSIS — E669 Obesity, unspecified: Secondary | ICD-10-CM | POA: Insufficient documentation

## 2018-02-14 DIAGNOSIS — I495 Sick sinus syndrome: Secondary | ICD-10-CM | POA: Insufficient documentation

## 2018-02-14 DIAGNOSIS — I2510 Atherosclerotic heart disease of native coronary artery without angina pectoris: Secondary | ICD-10-CM | POA: Insufficient documentation

## 2018-02-14 DIAGNOSIS — I1310 Hypertensive heart and chronic kidney disease without heart failure, with stage 1 through stage 4 chronic kidney disease, or unspecified chronic kidney disease: Secondary | ICD-10-CM | POA: Insufficient documentation

## 2018-02-14 DIAGNOSIS — E785 Hyperlipidemia, unspecified: Secondary | ICD-10-CM | POA: Insufficient documentation

## 2018-02-14 DIAGNOSIS — F419 Anxiety disorder, unspecified: Secondary | ICD-10-CM | POA: Insufficient documentation

## 2018-02-14 DIAGNOSIS — E039 Hypothyroidism, unspecified: Secondary | ICD-10-CM | POA: Diagnosis not present

## 2018-02-14 DIAGNOSIS — N183 Chronic kidney disease, stage 3 (moderate): Secondary | ICD-10-CM | POA: Insufficient documentation

## 2018-02-14 DIAGNOSIS — I130 Hypertensive heart and chronic kidney disease with heart failure and stage 1 through stage 4 chronic kidney disease, or unspecified chronic kidney disease: Secondary | ICD-10-CM | POA: Diagnosis not present

## 2018-02-14 DIAGNOSIS — Z833 Family history of diabetes mellitus: Secondary | ICD-10-CM | POA: Insufficient documentation

## 2018-02-14 DIAGNOSIS — N202 Calculus of kidney with calculus of ureter: Secondary | ICD-10-CM | POA: Insufficient documentation

## 2018-02-14 DIAGNOSIS — Z809 Family history of malignant neoplasm, unspecified: Secondary | ICD-10-CM | POA: Insufficient documentation

## 2018-02-14 DIAGNOSIS — G4730 Sleep apnea, unspecified: Secondary | ICD-10-CM | POA: Insufficient documentation

## 2018-02-14 DIAGNOSIS — M545 Low back pain: Secondary | ICD-10-CM | POA: Insufficient documentation

## 2018-02-14 DIAGNOSIS — I509 Heart failure, unspecified: Secondary | ICD-10-CM | POA: Diagnosis not present

## 2018-02-14 DIAGNOSIS — Z823 Family history of stroke: Secondary | ICD-10-CM | POA: Insufficient documentation

## 2018-02-14 HISTORY — PX: CYSTOSCOPY WITH RETROGRADE PYELOGRAM, URETEROSCOPY AND STENT PLACEMENT: SHX5789

## 2018-02-14 HISTORY — PX: HOLMIUM LASER APPLICATION: SHX5852

## 2018-02-14 SURGERY — CYSTOURETEROSCOPY, WITH RETROGRADE PYELOGRAM AND STENT INSERTION
Anesthesia: General | Laterality: Bilateral

## 2018-02-14 MED ORDER — OXYCODONE-ACETAMINOPHEN 5-325 MG PO TABS
ORAL_TABLET | ORAL | Status: AC
  Filled 2018-02-14: qty 2

## 2018-02-14 MED ORDER — PROPOFOL 10 MG/ML IV BOLUS
INTRAVENOUS | Status: DC | PRN
  Administered 2018-02-14: 200 mg via INTRAVENOUS

## 2018-02-14 MED ORDER — LACTATED RINGERS IV SOLN
INTRAVENOUS | Status: DC | PRN
  Administered 2018-02-14 (×2): via INTRAVENOUS

## 2018-02-14 MED ORDER — FENTANYL CITRATE (PF) 100 MCG/2ML IJ SOLN: 25.0000 ug | INTRAMUSCULAR | Status: DC | PRN

## 2018-02-14 MED ORDER — OXYCODONE-ACETAMINOPHEN 5-325 MG PO TABS
2.0000 | ORAL_TABLET | Freq: Once | ORAL | Status: AC
  Administered 2018-02-14: 2 via ORAL

## 2018-02-14 MED ORDER — ONDANSETRON HCL 4 MG/2ML IJ SOLN
INTRAMUSCULAR | Status: DC | PRN
  Administered 2018-02-14: 4 mg via INTRAVENOUS

## 2018-02-14 MED ORDER — ACETAMINOPHEN 10 MG/ML IV SOLN: 1000.0000 mg | Freq: Once | INTRAVENOUS | Status: DC | PRN

## 2018-02-14 MED ORDER — SODIUM CHLORIDE 0.9 % IR SOLN
Status: DC | PRN
  Administered 2018-02-14: 6000 mL

## 2018-02-14 MED ORDER — CEPHALEXIN 500 MG PO CAPS: 500.0000 mg | ORAL_CAPSULE | Freq: Two times a day (BID) | ORAL | 0 refills | Status: DC

## 2018-02-14 MED ORDER — LACTATED RINGERS IV SOLN
INTRAVENOUS | Status: DC
  Administered 2018-02-14: 14:00:00 via INTRAVENOUS

## 2018-02-14 MED ORDER — PROMETHAZINE HCL 25 MG/ML IJ SOLN: 6.2500 mg | INTRAMUSCULAR | Status: DC | PRN

## 2018-02-14 MED ORDER — DEXAMETHASONE SODIUM PHOSPHATE 10 MG/ML IJ SOLN
INTRAMUSCULAR | Status: DC | PRN
  Administered 2018-02-14: 4 mg via INTRAVENOUS

## 2018-02-14 MED ORDER — OXYCODONE-ACETAMINOPHEN 10-325 MG PO TABS: 1.0000 | ORAL_TABLET | ORAL | 0 refills | Status: DC | PRN

## 2018-02-14 MED ORDER — SODIUM CHLORIDE 0.9 % IR SOLN
Status: DC | PRN
  Administered 2018-02-14: 1000 mL

## 2018-02-14 MED ORDER — FENTANYL CITRATE (PF) 100 MCG/2ML IJ SOLN
INTRAMUSCULAR | Status: DC | PRN
  Administered 2018-02-14 (×2): 50 ug via INTRAVENOUS

## 2018-02-14 MED ORDER — KETOROLAC TROMETHAMINE 10 MG PO TABS: 10.0000 mg | ORAL_TABLET | Freq: Four times a day (QID) | ORAL | 0 refills | Status: DC | PRN

## 2018-02-14 MED ORDER — MIDAZOLAM HCL 2 MG/2ML IJ SOLN
INTRAMUSCULAR | Status: AC
  Filled 2018-02-14: qty 2

## 2018-02-14 MED ORDER — FENTANYL CITRATE (PF) 100 MCG/2ML IJ SOLN
INTRAMUSCULAR | Status: AC
  Filled 2018-02-14: qty 2

## 2018-02-14 MED ORDER — IOHEXOL 300 MG/ML  SOLN
INTRAMUSCULAR | Status: DC | PRN
  Administered 2018-02-14: 30 mL via URETHRAL

## 2018-02-14 MED ORDER — PROPOFOL 10 MG/ML IV BOLUS
INTRAVENOUS | Status: AC
  Filled 2018-02-14: qty 40

## 2018-02-14 MED ORDER — MIDAZOLAM HCL 5 MG/5ML IJ SOLN
INTRAMUSCULAR | Status: DC | PRN
  Administered 2018-02-14: 2 mg via INTRAVENOUS

## 2018-02-14 SURGICAL SUPPLY — 22 items
BAG URO CATCHER STRL LF (MISCELLANEOUS) ×3 IMPLANT
BASKET LASER NITINOL 1.9FR (BASKET) ×3 IMPLANT
BSKT STON RTRVL 120 1.9FR (BASKET) ×1
CATH INTERMIT  6FR 70CM (CATHETERS) ×3 IMPLANT
CLOTH BEACON ORANGE TIMEOUT ST (SAFETY) ×3 IMPLANT
FIBER LASER TRAC TIP (UROLOGICAL SUPPLIES) ×3 IMPLANT
GLOVE BIOGEL M STRL SZ7.5 (GLOVE) ×3 IMPLANT
GLOVE BIOGEL PI IND STRL 7.5 (GLOVE) ×1 IMPLANT
GLOVE BIOGEL PI INDICATOR 7.5 (GLOVE) ×2
GOWN STRL REUS W/TWL LRG LVL3 (GOWN DISPOSABLE) ×6 IMPLANT
GUIDEWIRE ANG ZIPWIRE 038X150 (WIRE) ×6 IMPLANT
GUIDEWIRE STR DUAL SENSOR (WIRE) ×6 IMPLANT
IV NS 1000ML (IV SOLUTION) ×3
IV NS 1000ML BAXH (IV SOLUTION) ×1 IMPLANT
MANIFOLD NEPTUNE II (INSTRUMENTS) ×3 IMPLANT
PACK CYSTO (CUSTOM PROCEDURE TRAY) ×3 IMPLANT
SHEATH URETERAL 12FRX35CM (MISCELLANEOUS) ×3 IMPLANT
STENT POLARIS 5FRX26 (STENTS) ×6 IMPLANT
SYR CONTROL 10ML LL (SYRINGE) ×3 IMPLANT
TUBE FEEDING 8FR 16IN STR KANG (MISCELLANEOUS) ×3 IMPLANT
TUBING CONNECTING 10 (TUBING) ×2 IMPLANT
TUBING CONNECTING 10' (TUBING) ×1

## 2018-02-14 NOTE — Telephone Encounter (Signed)
New Message        Patient's wife is calling today to let Dr. Lennie Odor know that her husband is having surgery today, just wanted to keep him in the "Know". Patient's wife would like a call back.

## 2018-02-14 NOTE — Discharge Instructions (Signed)
1 - You may have urinary urgency (bladder spasms) and bloody urine on / off with stent in place. This is normal.  2 - Remove tethered stens on Monday morning at home by pulling on string, then blue-white plastic tubing, and discarding. Office is open Monday if any problems arise. There are TWO stents.   3 - Call MD or go to ER for fever >102, severe pain / nausea / vomiting not relieved by medications, or acute change in medical status   General Anesthesia, Adult, Care After This sheet gives you information about how to care for yourself after your procedure. Your health care provider may also give you more specific instructions. If you have problems or questions, contact your health care provider. What can I expect after the procedure? After the procedure, the following side effects are common:  Pain or discomfort at the IV site.  Nausea.  Vomiting.  Sore throat.  Trouble concentrating.  Feeling cold or chills.  Weak or tired.  Sleepiness and fatigue.  Soreness and body aches. These side effects can affect parts of the body that were not involved in surgery. Follow these instructions at home:  For at least 24 hours after the procedure:  Have a responsible adult stay with you. It is important to have someone help care for you until you are awake and alert.  Rest as needed.  Do not: ? Participate in activities in which you could fall or become injured. ? Drive. ? Use heavy machinery. ? Drink alcohol. ? Take sleeping pills or medicines that cause drowsiness. ? Make important decisions or sign legal documents. ? Take care of children on your own. Eating and drinking  Follow any instructions from your health care provider about eating or drinking restrictions.  When you feel hungry, start by eating small amounts of foods that are soft and easy to digest (bland), such as toast. Gradually return to your regular diet.  Drink enough fluid to keep your urine pale  yellow.  If you vomit, rehydrate by drinking water, juice, or clear broth. General instructions  If you have sleep apnea, surgery and certain medicines can increase your risk for breathing problems. Follow instructions from your health care provider about wearing your sleep device: ? Anytime you are sleeping, including during daytime naps. ? While taking prescription pain medicines, sleeping medicines, or medicines that make you drowsy.  Return to your normal activities as told by your health care provider. Ask your health care provider what activities are safe for you.  Take over-the-counter and prescription medicines only as told by your health care provider.  If you smoke, do not smoke without supervision.  Keep all follow-up visits as told by your health care provider. This is important. Contact a health care provider if:  You have nausea or vomiting that does not get better with medicine.  You cannot eat or drink without vomiting.  You have pain that does not get better with medicine.  You are unable to pass urine.  You develop a skin rash.  You have a fever.  You have redness around your IV site that gets worse. Get help right away if:  You have difficulty breathing.  You have chest pain.  You have blood in your urine or stool, or you vomit blood. Summary  After the procedure, it is common to have a sore throat or nausea. It is also common to feel tired.  Have a responsible adult stay with you for the first 24 hours  after general anesthesia. It is important to have someone help care for you until you are awake and alert.  When you feel hungry, start by eating small amounts of foods that are soft and easy to digest (bland), such as toast. Gradually return to your regular diet.  Drink enough fluid to keep your urine pale yellow.  Return to your normal activities as told by your health care provider. Ask your health care provider what activities are safe for  you. This information is not intended to replace advice given to you by your health care provider. Make sure you discuss any questions you have with your health care provider. Document Released: 05/21/2000 Document Revised: 09/28/2016 Document Reviewed: 09/28/2016 Elsevier Interactive Patient Education  2019 [employer].

## 2018-02-14 NOTE — Anesthesia Preprocedure Evaluation (Addendum)
Anesthesia Evaluation  Patient identified by MRN, date of birth, ID band Patient awake    Reviewed: Allergy & Precautions, NPO status , Patient's Chart, lab work & pertinent test results  Airway Mallampati: I  TM Distance: >3 FB Neck ROM: Full    Dental no notable dental hx. (+) Edentulous Upper, Edentulous Lower, Upper Dentures, Lower Dentures   Pulmonary sleep apnea , former smoker,    Pulmonary exam normal breath sounds clear to auscultation       Cardiovascular hypertension, Normal cardiovascular exam+ dysrhythmias Atrial Fibrillation + pacemaker  Rhythm:Regular Rate:Normal     Neuro/Psych negative neurological ROS  negative psych ROS   GI/Hepatic negative GI ROS, Neg liver ROS,   Endo/Other  Hypothyroidism   Renal/GU negative Renal ROS  negative genitourinary   Musculoskeletal negative musculoskeletal ROS (+)   Abdominal   Peds negative pediatric ROS (+)  Hematology  (+) Blood dyscrasia, , On xarelto   Anesthesia Other Findings   Reproductive/Obstetrics negative OB ROS                            Anesthesia Physical Anesthesia Plan  ASA: III  Anesthesia Plan: General   Post-op Pain Management:    Induction: Intravenous  PONV Risk Score and Plan: 2 and Ondansetron, Dexamethasone and Treatment may vary due to age or medical condition  Airway Management Planned: LMA  Additional Equipment:   Intra-op Plan:   Post-operative Plan: Extubation in OR  Informed Consent: I have reviewed the patients History and Physical, chart, labs and discussed the procedure including the risks, benefits and alternatives for the proposed anesthesia with the patient or authorized representative who has indicated his/her understanding and acceptance.   Dental advisory given  Plan Discussed with: CRNA and Surgeon  Anesthesia Plan Comments:         Anesthesia Quick Evaluation

## 2018-02-14 NOTE — Anesthesia Procedure Notes (Signed)
Procedure Name: LMA Insertion Date/Time: 02/14/2018 4:22 PM Performed by: Jonna Munro, CRNA Pre-anesthesia Checklist: Patient identified, Emergency Drugs available, Suction available, Patient being monitored and Timeout performed Patient Re-evaluated:Patient Re-evaluated prior to induction Oxygen Delivery Method: Circle system utilized Preoxygenation: Pre-oxygenation with 100% oxygen Induction Type: IV induction LMA: LMA with gastric port inserted LMA Size: 5.0 Number of attempts: 1 Placement Confirmation: positive ETCO2 and breath sounds checked- equal and bilateral Dental Injury: Teeth and Oropharynx as per pre-operative assessment

## 2018-02-14 NOTE — Anesthesia Postprocedure Evaluation (Signed)
Anesthesia Post Note  Patient: Erik [name]  Procedure(s) Performed: CYSTOSCOPY WITH RETROGRADE PYELOGRAM, URETEROSCOPY AND STENT PLACEMENT (Bilateral ) HOLMIUM LASER APPLICATION (Bilateral )     Patient location during evaluation: PACU Anesthesia Type: General Level of consciousness: awake Pain management: pain level controlled Vital Signs Assessment: post-procedure vital signs reviewed and stable Respiratory status: spontaneous breathing Cardiovascular status: stable Postop Assessment: no apparent nausea or vomiting Anesthetic complications: no    Last Vitals:  Vitals:   02/14/18 1820 02/14/18 1830  BP:  136/87  Pulse: 62 63  Resp: 15 20  Temp:  36.7 C  SpO2: 98% 92%    Last Pain:  Vitals:   02/14/18 1830  PainSc: 0-No pain   Pain Goal: Patients Stated Pain Goal: 3 (02/14/18 1400)               Huston Foley

## 2018-02-14 NOTE — Telephone Encounter (Signed)
Spoke with patient's wife.  Does not need a call back.  Wanted to let Dr. Curt Bears know he is having a urology procedure today.  He has not held Xarelto.  Was instructed that it was ok to do procedure without holding any doses.

## 2018-02-14 NOTE — Brief Op Note (Signed)
02/14/2018  5:48 PM  PATIENT:  Erik [name]  72 y.o. male  PRE-OPERATIVE DIAGNOSIS:  RIGHT URETERAL, BILATERAL RENAL STONES  POST-OPERATIVE DIAGNOSIS:  RIGHT URETERAL, BILATERAL RENAL STONES  PROCEDURE:  Procedure(s): CYSTOSCOPY WITH RETROGRADE PYELOGRAM, URETEROSCOPY AND STENT PLACEMENT (Bilateral) HOLMIUM LASER APPLICATION (Bilateral)  SURGEON:  Surgeon(s) and Role:    Alexis Frock, MD - Primary  PHYSICIAN ASSISTANT:   ASSISTANTS: none   ANESTHESIA:   general  EBL:  minimal   BLOOD ADMINISTERED:none  DRAINS: none   LOCAL MEDICATIONS USED:  NONE  SPECIMEN:  Source of Specimen:  bilateral renal / right ureteral stone fragments  DISPOSITION OF SPECIMEN:  [facility] for compositional analysis  COUNTS:  YES  TOURNIQUET:  * No tourniquets in log *  DICTATION: .Other Dictation: Dictation Number [id number]  PLAN OF CARE: Discharge to home after PACU  PATIENT DISPOSITION:  PACU - hemodynamically stable.   Delay start of Pharmacological VTE agent (>24hrs) due to surgical blood loss or risk of bleeding: yes

## 2018-02-14 NOTE — Transfer of Care (Signed)
Immediate Anesthesia Transfer of Care Note  Patient: Erik [name]  Procedure(s) Performed: CYSTOSCOPY WITH RETROGRADE PYELOGRAM, URETEROSCOPY AND STENT PLACEMENT (Bilateral ) HOLMIUM LASER APPLICATION (Bilateral )  Patient Location: PACU  Anesthesia Type:General  Level of Consciousness: sedated, patient cooperative and responds to stimulation  Airway & Oxygen Therapy: Patient Spontanous Breathing and Patient connected to face mask oxygen  Post-op Assessment: Report given to RN and Post -op Vital signs reviewed and stable  Post vital signs: Reviewed and stable  Last Vitals:  Vitals Value Taken Time  BP 140/72 02/14/2018  6:03 PM  Temp 36.3 C 02/14/2018  6:03 PM  Pulse 60 02/14/2018  6:07 PM  Resp 11 02/14/2018  6:07 PM  SpO2 94 % 02/14/2018  6:07 PM  Vitals shown include unvalidated device data.  Last Pain:  Vitals:   02/14/18 1803  PainSc: Asleep      Patients Stated Pain Goal: 3 (15/87/27 6184)  Complications: No apparent anesthesia complications

## 2018-02-14 NOTE — Telephone Encounter (Signed)
Copied from [place] [id number]. Topic: General - Other >> Feb 14, 2018  9:04 AM Lennox Solders wrote: reason for CRM: pt wife is calling and wanted dr fry to know her husband will be having kidney stones removal by dr Tammi Klippel at [facility]rology today   FYI for Dr. Sarajane Jews

## 2018-02-14 NOTE — H&P (Signed)
Erik [name] is an 72 y.o. male.    Chief Complaint: Pre-op BILATERAL Ureteroscopic Stone Manipulation  HPI:   1 - Urolithiasis - Rt 66mm mid ureteral stone just above level of iliacs wti hmidl hydro by ER CT late 12/2017. Stone is 97mm, 800HU, SSD 16cm, no seen on scout images as overlying bone. Rt renal 27mm x 5 and Lt 22mm x 2 non-obsructing renal stones. Cr 1.7 up from baseline 1.3, UCX negative.   Given trial of medical therapy with alpha blockers, pain meds and reports no interval passage, but pain managable.   2 - Prostate Screening - Annual screening with PSA until late 2018 at age 37 at which point PSA 1.03.   PMH sig for CAD/Xarelto, HTN, Obesity, Back surgery with LE neuropathy. His PCP is [name] MD   Today " Erik [name] " is seen to proceed with BILATERAL ureteroscopioc stone manipulation with goal of stone free. No interval fevers.    Past Medical History:  Diagnosis Date  . Anxiety   . Arthritis   . Atypical chest pain 06/26/2015  . BPH with urinary obstruction   . Carotid disease, bilateral ([place])    a. 09/2014 Carotid U/S: 1-39% bilat ICA stenosis.  . Chronic lower back pain   . CKD (chronic kidney disease), stage III ([place])    pt. denies  . Diverticulitis 12/2016  . Diverticulosis   . Dyspnea   . Gout    "couple days/year" (05/17/2017)  . History of kidney stones   . Hyperlipidemia   . Hypertension   . Hypertensive heart disease   . Hypothyroidism   . Internal hemorrhoids   . Long QT interval 06/26/2015  . Nephrolithiasis   . Neuropathy 07/30/2017  . Non-obstructive CAD    a. 07/2002 Cath: LM 20, LAD 92m/d, LCX 50-34m, OM1 106m, RCA 31m, EF 60%; b. 05/2014 MV: low risk w/ small sized, mild intensity rev defect in apical/inferior/infsept area, nl EF->Med Rx.  . Panic attack   . Paroxysmal atrial fibrillation ([place])    a. Dx 04/2014; b. 05/2014 Echo: Ef 60-65%, no rwma, triv MR/TR, nl RV;  c. CHA2DS2VASc = 3-->was on eliquis, switched to xarelto 04/2015 2/2 cost; d. 05/2015  Tikosyn loaded w/ conversion to AF; e. 06/2015 QTc prolongation and bradycardia->tikosyn d/c'd, PPM placed, Amio started; f. 07/01/2015 In Aflutter @ clinic f/u.  [name] Paroxysmal atrial flutter ([place])    a. 06/2015 noted to be in rapid Aflutter in Afib clinic-->amio load continued.  . Persistent atrial fibrillation 06/21/2015  . Presence of permanent cardiac pacemaker    St. Jude  . Prolapsed internal hemorrhoids, grade 2-3 10/19/2016   LL Gr 3 and RP/RA Gr 2 LL banded 10/19/2016   . RLL pneumonia ([place]) 11/05/2012  . Sleep apnea    "can't tolerate mask" (05/17/2017)  . Tachy-brady syndrome ([place])    a. 06/27/2015 s/p SJM DC PPM (ser # @ [id number]).    Past Surgical History:  Procedure Laterality Date  . ATRIAL FIBRILLATION ABLATION N/A 05/17/2017   Procedure: ATRIAL FIBRILLATION ABLATION;  Surgeon: Constance Haw, MD;  Location: [facility];  Service: Cardiovascular;  Laterality: N/A;  . BACK SURGERY    . CARDIAC CATHETERIZATION  ~ 2000  . CLOSED REDUCTION HAND FRACTURE Right 1984  . COLONOSCOPY  06/27/2015   per Dr. Henrene Pastor, internal hemorrhoids and diverticulae, repeat 10 yrs  . EP IMPLANTABLE DEVICE N/A 06/27/2015   Procedure: Pacemaker Implant;  Surgeon: Evans Lance, MD;  Location: [facility]  INVASIVE CV LAB;  Service: Cardiovascular;  Laterality: N/A;  . FRACTURE SURGERY    . HEMORRHOID BANDING    . INSERT / REPLACE / REMOVE PACEMAKER    . LACERATION REPAIR Right 1984   "hand"  . POSTERIOR LUMBAR FUSION  1998   2 lumbar discs, Dr. Ellene Route; "ray cages"    Family History  Problem Relation Age of Onset  . Dementia Mother   . Stroke Father   . Diabetes Paternal Grandmother   . Stroke Paternal Aunt        x 2  . Cancer Paternal Aunt        type unknown  . Colon cancer Neg Hx   . Stomach cancer Neg Hx    Social History:  reports that he quit smoking about 22 years ago. His smoking use included cigarettes. He has a 56.00 pack-year smoking history. He has never used smokeless tobacco. He  reports previous alcohol use. He reports that he does not use drugs.  Allergies: No Known Allergies  No medications prior to admission.    Results for orders placed or performed during the hospital encounter of 02/12/18 (from the past 48 hour(s))  Basic metabolic panel     Status: Abnormal   Collection Time: 02/12/18  2:32 PM  Result Value Ref Range   Sodium 142 135 - 145 mmol/L   Potassium 3.8 3.5 - 5.1 mmol/L   Chloride 107 98 - 111 mmol/L   CO2 27 22 - 32 mmol/L   Glucose, Bld 98 70 - 99 mg/dL   BUN 21 8 - 23 mg/dL   Creatinine, Ser 1.41 (H) 0.61 - 1.24 mg/dL   Calcium 9.2 8.9 - 10.3 mg/dL   GFR calc non Af Amer 50 (L) >60 mL/min   GFR calc Af Amer 58 (L) >60 mL/min   Anion gap 8 5 - 15    Comment: Performed at [facility], [place] [address] [place], [place] [address]  CBC     Status: None   Collection Time: 02/12/18  2:32 PM  Result Value Ref Range   WBC 5.8 4.0 - 10.5 K/uL   RBC 4.89 4.22 - 5.81 MIL/uL   Hemoglobin 14.2 13.0 - 17.0 g/dL   HCT 43.1 39.0 - 52.0 %   MCV 88.1 80.0 - 100.0 fL   MCH 29.0 26.0 - 34.0 pg   MCHC 32.9 30.0 - 36.0 g/dL   RDW 12.4 11.5 - 15.5 %   Platelets 213 150 - 400 K/uL   nRBC 0.0 0.0 - 0.2 %    Comment: Performed at [facility], [place] [address], [place], [place] [address]   No results found.  Review of Systems  Constitutional: Negative.  Negative for chills and fever.  HENT: Negative.   Eyes: Negative.   Respiratory: Negative.   Cardiovascular: Negative.   Gastrointestinal: Negative.   Genitourinary: Positive for flank pain.  Skin: Negative.   Neurological: Negative.   Endo/Heme/Allergies: Negative.   Psychiatric/Behavioral: Negative.     There were no vitals taken for this visit. Physical Exam  Constitutional: He appears well-developed.  HENT:  Head: Normocephalic.  Eyes: Pupils are equal, round, and reactive to light.  Cardiovascular: Normal rate.  Respiratory: Effort normal.  GI:  Soft.  Genitourinary:    Genitourinary Comments: Mild Rt CVAT at present.    Musculoskeletal: Normal range of motion.  Neurological: He is alert.  Skin: Skin is warm.  Psychiatric: He has a normal mood and affect.  Assessment/Plan  Proceed as planned with BILATERAL ureteroscopy with goal of stone free. RIsks, benefits, expected peri-op course discussed previously and reiterated today.   Alexis Frock, MD 02/14/2018, 7:31 AM

## 2018-02-15 ENCOUNTER — Encounter (HOSPITAL_COMMUNITY): Payer: Self-pay | Admitting: Urology

## 2018-02-15 NOTE — Op Note (Signed)
NAME: Erik [name], [facility] MEDICAL RECORD UX:[id number] ACCOUNT [id number] DATE OF BIRTH:1945-10-25 FACILITY: [facility] LOCATION: [facility] PHYSICIAN:Amun Stemm, MD  OPERATIVE REPORT  DATE OF PROCEDURE:  02/14/2018  PREOPERATIVE DIAGNOSIS:  Right ureteral, bilateral renal stones, recurrent.  PROCEDURE: 1.  Cystoscopy, bilateral pyelograms, interpretation. 2.  Bilateral ureteroscopy with laser lithotripsy. 3.  Insertion of bilateral ureteral stents 5 x 26 Polaris with tether.  SURGEON:  Alexis Frock, MD  ESTIMATED BLOOD LOSS:  Nil.  COMPLICATIONS:  None.  SPECIMENS:  Bilateral renal and ureteral stone fragments for composition analysis.  FINDINGS: 1.  Bilateral multifocal papillary tip calcifications. 2.  Right mid ureteral stone with proximal mild hydroureteronephrosis. 3.  Complete resolution of all accessible stone fragments larger than one-third mm following laser lithotripsy and basket extraction. 4.  Successful placement of bilateral ureteral stents proximal in the renal pelvis, distal end in urinary bladder.  INDICATIONS:  The patient is a very pleasant 72 year old gentleman with history of recurrent nephrolithiasis mostly managed medically.  He has had a several-week to prodrome of right-sided renal colic from right mid ureteral stone.  This has failed to  pass with medical therapy.  He is known also harbor bilateral papillary tip calcifications.  Options were discussed for management including continued medical therapy versus shockwave lithotripsy of his dominant stone obstructing, versus bilateral  ureteroscopy with goal of stone free and he wished to receive the latter.  Informed consent was obtained and placed in medical record.  DESCRIPTION OF PROCEDURE:  The patient was identified.  The procedure being bilateral ureteroscopic stimulation was confirmed.  Procedure timeout was performed.  Preoperative antibiotics administered.  General LMA anesthesia induced.  The patient  was  placed into a low lithotomy position, sterile field was created by prepping and draping his penis, perineum and proximal thighs using iodine.  Cystourethroscopy was performed using a 22-French rigid cystoscope with vessel lens.  Inspection of anterior  and posterior urethra revealed some mild bilobar prostatic hypertrophy.  The right ureteral orifice was cannulated with a 6-French renal catheter and right retrograde pyelogram was obtained.  Right retrograde pyelogram demonstrated a single right ureter single system right kidney.  There was a filling defect in the midureter consistent with known stone.  There was mild hydronephrosis above this.  A 0.038 ZIPwire was advanced to lower pole and  set aside as a safety wire.  Similarly, left retrograde pyelogram was obtained.  Left retrograde pyelogram demonstrated a single left ureter single system left kidney.  No filling defects or narrowing noted.  A separate ZIPwire was advanced to the left side and set aside as a safety wire.  An 8-French feeding tube was placed in the  urinary bladder for pressure release, and semirigid ureteroscopy was performed of the distal orifice left ureter alongside a separate sensor working wire.  No mucosal abnormalities were found.  Similarly semirigid ureteroscopy was performed of the distal  orifice.  The right ureter alongside a separate sensor working wire.  In the midureter just above the area of the iliac crossing there was actually a conglomerate of 2 small stones that appeared to be obstructing stones.  These did appear too large for  simple basketing.  As such, holmium laser energy applied 70 setting of 0.3 joules and 30 Hz.  These were ablated approximately 3 to 4 smaller pieces and each.  These were then sequentially grasped with the long axis, removed and set aside for composition  analysis.  The remainder of the inspection of the distal orifice of  the right ureter revealed no distal calcifications or mucosal  abnormalities.  The semirigid scope  was then exchanged for a 12/14 a medium sized access sheath to the level of the  proximal ureter using continuous fluoroscopic guidance and flexible digital ureteroscopy performed the proximal right ureter and systematic inspection of the right kidney, including all calices x3.  There is multifocal papillary tip calcifications, most  of which were amenable to simple basketing.  There were several that did require holmium laser lithotripsy to break up into fragments that were then basketable.  After this, there was complete resolution of all accessible stone fragments larger than  one-third mm on the right side.  Similarly, the access sheath was advanced over the left sensor working wire to the left proximal ureter using continuous fluoroscopic guidance and flexible digital ureteroscopy performed the proximal left ureter and  systematic inspection of the left kidney.  Initially, there were multifocal papillary tip calcifications in the left side, most of which were amenable to simple basketing with 2 requiring laser lithotripsy as well.  Following this, there was complete  resolution of all accessible stone fragments larger than one-third mm.  The access sheath was removed under continuous vision.  Significant mucosal abnormalities were noted.  Given the bilateral nature of the procedure, it was felt that brief interval  stenting with tethered stents would be warranted.  As such, a new 5 x 26 type stents were placed with remaining safety wires bilaterally using fluoroscopic guidance.  Good proximal and distal points were noted.  Tethers were left in place and fashioned  to the dorsum of the penis.  The procedure was terminated.  The patient tolerated the procedure well.  There were no immediate apparent complications.  The patient was taken to the postanesthesia care in stable condition.  TN/NUANCE  D:02/14/2018 T:02/15/2018 JOB:[id number]

## 2018-02-17 DIAGNOSIS — R1084 Generalized abdominal pain: Secondary | ICD-10-CM | POA: Diagnosis not present

## 2018-02-17 DIAGNOSIS — N202 Calculus of kidney with calculus of ureter: Secondary | ICD-10-CM | POA: Diagnosis not present

## 2018-02-21 ENCOUNTER — Telehealth: Payer: Self-pay | Admitting: Cardiology

## 2018-02-21 DIAGNOSIS — R310 Gross hematuria: Secondary | ICD-10-CM | POA: Diagnosis not present

## 2018-02-21 NOTE — Telephone Encounter (Signed)
Forwarding to [employer] for his FYI.

## 2018-02-21 NOTE — Telephone Encounter (Signed)
Pt c/o medication issue:  1. Name of Medication: XARELTO 20 MG TABS tablet  2. How are you currently taking this medication (dosage and times per day)?  3. Are you having a reaction (difficulty breathing--STAT)? n/a  4. What is your medication issue? Patient's wife called stating the urologist is holding his Reita Chard for several days since he is having bleeding.  He had a procedure done 02/14/18. She wanted to make Dr. Curt Bears aware of this.

## 2018-02-21 NOTE — Telephone Encounter (Signed)
Noted  

## 2018-02-28 DIAGNOSIS — N202 Calculus of kidney with calculus of ureter: Secondary | ICD-10-CM | POA: Diagnosis not present

## 2018-03-03 ENCOUNTER — Encounter: Payer: Self-pay | Admitting: Family Medicine

## 2018-03-03 ENCOUNTER — Ambulatory Visit (INDEPENDENT_AMBULATORY_CARE_PROVIDER_SITE_OTHER): Payer: Medicare Other | Admitting: Family Medicine

## 2018-03-03 VITALS — BP 120/68 | HR 70 | Temp 98.1°F | Wt 250.1 lb

## 2018-03-03 DIAGNOSIS — Z23 Encounter for immunization: Secondary | ICD-10-CM | POA: Diagnosis not present

## 2018-03-03 DIAGNOSIS — S76211A Strain of adductor muscle, fascia and tendon of right thigh, initial encounter: Secondary | ICD-10-CM | POA: Diagnosis not present

## 2018-03-03 MED ORDER — OXYCODONE-ACETAMINOPHEN 10-325 MG PO TABS: 1.0000 | ORAL_TABLET | ORAL | 0 refills | Status: DC | PRN

## 2018-03-03 NOTE — Progress Notes (Signed)
   Subjective:    Patient ID: Erik [name], male    DOB: 1945/10/19, 73 y.o.   MRN: [id number]  HPI Here for an injury that occurred at home about 3 weeks ago. While he was working in his garage he attempted to lift his leg and push a heavy work Astronomer. As he did so he heard a loud "pop" and felt a severe pain in the right groin area. The pain has persisted since then, although it has improved some. He is able to walk on it.   Review of Systems  Constitutional: Negative.   Respiratory: Negative.   Cardiovascular: Negative.   Musculoskeletal: Positive for arthralgias.       Objective:   Physical Exam Constitutional:      Appearance: Normal appearance.     Comments: He walks with a slight limp  Cardiovascular:     Rate and Rhythm: Normal rate and regular rhythm.     Pulses: Normal pulses.     Heart sounds: Normal heart sounds.  Pulmonary:     Effort: Pulmonary effort is normal.     Breath sounds: Normal breath sounds.  Musculoskeletal:     Comments: He is very tender in the right groin over the the proximal insertion of the hip flexor tendon onto the pelvis. His ROM is intact. He has pain on flexion against resistance.   Neurological:     Mental Status: He is alert.           Assessment & Plan:  Hip flexor strain. Rest, apply ice. Use Percocet for pain. Recheck prn. [name], MD

## 2018-03-03 NOTE — Addendum Note (Signed)
Addended by: Elie Confer on: 03/03/2018 03:49 PM   Modules accepted: Orders

## 2018-03-24 ENCOUNTER — Ambulatory Visit (INDEPENDENT_AMBULATORY_CARE_PROVIDER_SITE_OTHER): Payer: Medicare Other

## 2018-03-24 DIAGNOSIS — R001 Bradycardia, unspecified: Secondary | ICD-10-CM

## 2018-03-24 DIAGNOSIS — I495 Sick sinus syndrome: Secondary | ICD-10-CM | POA: Diagnosis not present

## 2018-03-26 NOTE — Progress Notes (Signed)
Remote pacemaker transmission.   

## 2018-03-27 ENCOUNTER — Ambulatory Visit (INDEPENDENT_AMBULATORY_CARE_PROVIDER_SITE_OTHER): Payer: Medicare Other | Admitting: Family Medicine

## 2018-03-27 ENCOUNTER — Encounter: Payer: Self-pay | Admitting: Family Medicine

## 2018-03-27 VITALS — BP 118/62 | HR 64 | Temp 98.5°F | Ht 69.5 in | Wt 262.2 lb

## 2018-03-27 DIAGNOSIS — S76211D Strain of adductor muscle, fascia and tendon of right thigh, subsequent encounter: Secondary | ICD-10-CM | POA: Diagnosis not present

## 2018-03-27 DIAGNOSIS — R0981 Nasal congestion: Secondary | ICD-10-CM | POA: Diagnosis not present

## 2018-03-27 LAB — CUP PACEART REMOTE DEVICE CHECK
Battery Remaining Percentage: 95.5 %
Brady Statistic AP VP Percent: 1 %
Brady Statistic AP VS Percent: 87 %
Brady Statistic AS VP Percent: 1 %
Brady Statistic AS VS Percent: 12 %
Brady Statistic RA Percent Paced: 87 %
Brady Statistic RV Percent Paced: 1 %
Date Time Interrogation Session: 20200127070018
Implantable Lead Implant Date: 20170501
Implantable Lead Location: 753859
Implantable Lead Location: 753860
Implantable Pulse Generator Implant Date: 20170501
Lead Channel Impedance Value: 450 Ohm
Lead Channel Impedance Value: 550 Ohm
Lead Channel Pacing Threshold Amplitude: 0.75 V
Lead Channel Pacing Threshold Amplitude: 0.875 V
Lead Channel Pacing Threshold Pulse Width: 0.5 ms
Lead Channel Pacing Threshold Pulse Width: 0.5 ms
Lead Channel Sensing Intrinsic Amplitude: 12 mV
Lead Channel Sensing Intrinsic Amplitude: 3 mV
Lead Channel Setting Pacing Amplitude: 1.875
Lead Channel Setting Pacing Pulse Width: 0.5 ms
Lead Channel Setting Sensing Sensitivity: 2 mV
MDC IDC LEAD IMPLANT DT: 20170501
MDC IDC MSMT BATTERY REMAINING LONGEVITY: 116 mo
MDC IDC MSMT BATTERY VOLTAGE: 2.99 V
MDC IDC SET LEADCHNL RV PACING AMPLITUDE: 2.5 V
Pulse Gen Model: 2272
Pulse Gen Serial Number: 7897836

## 2018-03-27 NOTE — Progress Notes (Signed)
   Subjective:    Patient ID: Erik [name], male    DOB: 07/10/1945, 72 y.o.   MRN: [id number]  HPI Here for 2 issues. First he still has a lot of pain in the right groin after he injured it about 5 weeks ago. He is resting it and icing it, but it is not improving. Using one Percocet at night. Also he wants to see an ENT for trouble breathing through his nose. This has been a problem for several years. He snores and he has sleep apnea, but he does not use his CPAP machine. He admits to using Afrin at least once every day.    Review of Systems  Constitutional: Negative.   HENT: Positive for congestion and sinus pressure. Negative for sinus pain.   Eyes: Negative.   Respiratory: Negative.   Musculoskeletal: Positive for arthralgias.       Objective:   Physical Exam Constitutional:      Appearance: Normal appearance.  HENT:     Right Ear: Tympanic membrane and ear canal normal.     Left Ear: Tympanic membrane and ear canal normal.     Nose: Nose normal.     Mouth/Throat:     Pharynx: Oropharynx is clear.  Eyes:     Conjunctiva/sclera: Conjunctivae normal.  Pulmonary:     Effort: Pulmonary effort is normal.     Breath sounds: Normal breath sounds.  Musculoskeletal:     Comments: Tender in the right groin, full ROM   Neurological:     Mental Status: He is alert.           Assessment & Plan:  For the groin injury, we wil refer to Orthopedics. For the nasal congestion, first I advised him to stop using Afrin and to use Flonase daily instead. Refer to ENT.  [name], MD

## 2018-03-28 ENCOUNTER — Ambulatory Visit (INDEPENDENT_AMBULATORY_CARE_PROVIDER_SITE_OTHER): Payer: Medicare Other | Admitting: Family Medicine

## 2018-03-28 ENCOUNTER — Ambulatory Visit (INDEPENDENT_AMBULATORY_CARE_PROVIDER_SITE_OTHER): Payer: Self-pay

## 2018-03-28 ENCOUNTER — Encounter (INDEPENDENT_AMBULATORY_CARE_PROVIDER_SITE_OTHER): Payer: Self-pay | Admitting: Family Medicine

## 2018-03-28 DIAGNOSIS — M25551 Pain in right hip: Secondary | ICD-10-CM

## 2018-03-28 MED ORDER — METHYLPREDNISOLONE ACETATE 40 MG/ML IJ SUSP
40.0000 mg | INTRAMUSCULAR | Status: AC | PRN
  Administered 2018-03-28: 40 mg via INTRA_ARTICULAR

## 2018-03-28 MED ORDER — LIDOCAINE HCL (PF) 1 % IJ SOLN
5.0000 mL | INTRAMUSCULAR | Status: AC | PRN
  Administered 2018-03-28: 5 mL

## 2018-03-28 NOTE — Progress Notes (Signed)
Office Visit Note   Patient: Erik [name]           Date of Birth: January 04, 1946           MRN: [id number] Visit Date: 03/28/2018 Requested by: [name], MD [place], [place] [address] PCP: [name], MD  Subjective: Chief Complaint  Patient presents with  . Right Leg - Pain    Pain in the groin since early December. Pain sometimes in the buttock region. Radiates down leg to foot. Has neuropathy in both feet.    HPI: He is a 73 year old with right hip pain.  Symptoms started in December, he recalls lifting his leg up to rest his knee on something and he felt a deep pop in the groin area.  He has had intermittent pain since then.  There was never any bruising or swelling.  Even prior to this, he has had pain in both hips for the past 2 years.  He cannot walk very far without having to stop and rest.  Groin pain without radiation past the knees.              ROS: He has a history of atrial fibrillation treated with anticoagulation.  He has a pacemaker as well.  He has a history of diabetes, chronic kidney disease and hyperlipidemia.  Objective: Vital Signs: There were no vitals taken for this visit.  Physical Exam:  Right hip: Tender to palpation in the deep anterior hip.  Minimal pain with flexion, adduction against resistance.  Moderate pain with passive external rotation and moderate to severe pain with passive internal rotation.  Imaging: X-rays right hip: No obvious fracture seen.  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   Assessment & Plan: 1.  Acute on chronic right hip pain, suspect due to underlying DJD.  Cannot rule out partial tear of abductor tendon. -Discussed options with patient, he wants to try an intra-articular cortisone injection.  This will help diagnostically and therapeutically.  If no improvement at all, then MRI of the hip.  If he has good improvement, then he might benefit from hip replacement at some point.   Follow-Up  Instructions: No follow-ups on file.      Procedures: Large Joint Inj: R hip joint on 03/28/2018 3:56 PM Indications: pain Details: 22 G 1.5 in and 3.5 in needle, ultrasound-guided anterolateral approach  Arthrogram: No  Medications: 5 mL lidocaine (PF) 1 %; 40 mg methylPREDNISolone acetate 40 MG/ML Procedure, treatment alternatives, risks and benefits explained, specific risks discussed. Consent was given by the patient. Immediately prior to procedure a time out was called to verify the correct patient, procedure, equipment, support staff and site/side marked as required. Patient was prepped and draped in the usual sterile fashion.      No notes on file    PMFS History: Patient Active Problem List   Diagnosis Date Noted  . Neuropathy 07/30/2017  . Prolapsed internal hemorrhoids, grade 2-3 10/19/2016  . Constipation 09/29/2015  . CKD (chronic kidney disease), stage III ([place])   . Dizziness 06/26/2015  . Long QT interval 06/26/2015  . Visit for monitoring Tikosyn therapy 06/26/2015  . Atypical chest pain 06/26/2015  . Persistent atrial fibrillation 06/21/2015  . Hypertensive heart disease   . Hypothyroidism   . Non-obstructive CAD   . Carotid disease, bilateral ([place])   . OBESITY 09/20/2008  . Hyperlipidemia 12/17/2005  . Essential hypertension 12/17/2005   Past Medical History:  Diagnosis Date  .  Anxiety   . Arthritis   . Atypical chest pain 06/26/2015  . BPH with urinary obstruction   . Carotid disease, bilateral ([place])    a. 09/2014 Carotid U/S: 1-39% bilat ICA stenosis.  . Chronic lower back pain   . CKD (chronic kidney disease), stage III ([place])    pt. denies  . Diverticulitis 12/2016  . Diverticulosis   . Dyspnea   . Gout    "couple days/year" (05/17/2017)  . History of kidney stones   . Hyperlipidemia   . Hypertension   . Hypertensive heart disease   . Hypothyroidism   . Internal hemorrhoids   . Long QT interval 06/26/2015  . Nephrolithiasis   . Neuropathy  07/30/2017  . Non-obstructive CAD    a. 07/2002 Cath: LM 20, LAD 74m/d, LCX 50-8m, OM1 44m, RCA 73m, EF 60%; b. 05/2014 MV: low risk w/ small sized, mild intensity rev defect in apical/inferior/infsept area, nl EF->Med Rx.  . Panic attack   . Paroxysmal atrial fibrillation ([place])    a. Dx 04/2014; b. 05/2014 Echo: Ef 60-65%, no rwma, triv MR/TR, nl RV;  c. CHA2DS2VASc = 3-->was on eliquis, switched to xarelto 04/2015 2/2 cost; d. 05/2015 Tikosyn loaded w/ conversion to AF; e. 06/2015 QTc prolongation and bradycardia->tikosyn d/c'd, PPM placed, Amio started; f. 07/01/2015 In Aflutter @ clinic f/u.  [name] Paroxysmal atrial flutter ([place])    a. 06/2015 noted to be in rapid Aflutter in Afib clinic-->amio load continued.  . Persistent atrial fibrillation 06/21/2015  . Presence of permanent cardiac pacemaker    St. Jude  . Prolapsed internal hemorrhoids, grade 2-3 10/19/2016   LL Gr 3 and RP/RA Gr 2 LL banded 10/19/2016   . RLL pneumonia ([place]) 11/05/2012  . Sleep apnea    "can't tolerate mask" (05/17/2017)  . Tachy-brady syndrome ([place])    a. 06/27/2015 s/p SJM DC PPM (ser # @ [id number]).    Family History  Problem Relation Age of Onset  . Dementia Mother   . Stroke Father   . Diabetes Paternal Grandmother   . Stroke Paternal Aunt        x 2  . Cancer Paternal Aunt        type unknown  . Colon cancer Neg Hx   . Stomach cancer Neg Hx     Past Surgical History:  Procedure Laterality Date  . ATRIAL FIBRILLATION ABLATION N/A 05/17/2017   Procedure: ATRIAL FIBRILLATION ABLATION;  Surgeon: Constance Haw, MD;  Location: [facility];  Service: Cardiovascular;  Laterality: N/A;  . BACK SURGERY    . CARDIAC CATHETERIZATION  ~ 2000  . CLOSED REDUCTION HAND FRACTURE Right 1984  . COLONOSCOPY  06/27/2015   per Dr. Henrene Pastor, internal hemorrhoids and diverticulae, repeat 10 yrs  . CYSTOSCOPY WITH RETROGRADE PYELOGRAM, URETEROSCOPY AND STENT PLACEMENT Bilateral 02/14/2018   Procedure: CYSTOSCOPY WITH RETROGRADE  PYELOGRAM, URETEROSCOPY AND STENT PLACEMENT;  Surgeon: Alexis Frock, MD;  Location: [facility];  Service: Urology;  Laterality: Bilateral;  . EP IMPLANTABLE DEVICE N/A 06/27/2015   Procedure: Pacemaker Implant;  Surgeon: Evans Lance, MD;  Location: [facility];  Service: Cardiovascular;  Laterality: N/A;  . FRACTURE SURGERY    . HEMORRHOID BANDING    . HOLMIUM LASER APPLICATION Bilateral 45/40/9811   Procedure: HOLMIUM LASER APPLICATION;  Surgeon: Alexis Frock, MD;  Location: [facility];  Service: Urology;  Laterality: Bilateral;  . INSERT / REPLACE / REMOVE PACEMAKER    . LACERATION REPAIR Right 1984   "  hand"  . POSTERIOR LUMBAR FUSION  1998   2 lumbar discs, Dr. Ellene Route; "ray cages"   Social History   Occupational History  . Occupation: retired  Tobacco Use  . Smoking status: Former Smoker    Packs/day: 2.00    Years: 28.00    Pack years: 56.00    Types: Cigarettes    Last attempt to quit: 02/27/1995    Years since quitting: 23.0  . Smokeless tobacco: Never Used  Substance and Sexual Activity  . Alcohol use: Not Currently    Alcohol/week: 0.0 standard drinks    Comment: couple times a month  . Drug use: No  . Sexual activity: Not Currently    Partners: Female

## 2018-04-10 DIAGNOSIS — J3089 Other allergic rhinitis: Secondary | ICD-10-CM | POA: Diagnosis not present

## 2018-04-18 ENCOUNTER — Telehealth (INDEPENDENT_AMBULATORY_CARE_PROVIDER_SITE_OTHER): Payer: Self-pay | Admitting: Family Medicine

## 2018-04-18 NOTE — Telephone Encounter (Signed)
Patient's wife called stating that Dr. Junius Roads had told the patient that when he was ready to see the orthopedic surgeon to let him know.  Patient's wife was not sure which provider Dr. Junius Roads was going to refer him to.  Wife's CB#[phone].  Thank you.

## 2018-04-18 NOTE — Telephone Encounter (Signed)
Schedule ov with Dr. Erlinda Hong for hip DJD.

## 2018-04-18 NOTE — Telephone Encounter (Signed)
Please advise 

## 2018-04-18 NOTE — Telephone Encounter (Signed)
Will you please schedule this?

## 2018-04-29 ENCOUNTER — Ambulatory Visit (INDEPENDENT_AMBULATORY_CARE_PROVIDER_SITE_OTHER): Payer: Medicare Other | Admitting: Orthopaedic Surgery

## 2018-04-29 ENCOUNTER — Encounter (INDEPENDENT_AMBULATORY_CARE_PROVIDER_SITE_OTHER): Payer: Self-pay | Admitting: Orthopaedic Surgery

## 2018-04-29 DIAGNOSIS — M1611 Unilateral primary osteoarthritis, right hip: Secondary | ICD-10-CM

## 2018-04-29 MED ORDER — BUPIVACAINE HCL 0.5 % IJ SOLN
3.0000 mL | INTRAMUSCULAR | Status: AC | PRN
  Administered 2018-04-29: 3 mL via INTRA_ARTICULAR

## 2018-04-29 MED ORDER — LIDOCAINE HCL 1 % IJ SOLN
3.0000 mL | INTRAMUSCULAR | Status: AC | PRN
  Administered 2018-04-29: 3 mL

## 2018-04-29 MED ORDER — METHYLPREDNISOLONE ACETATE 40 MG/ML IJ SUSP
40.0000 mg | INTRAMUSCULAR | Status: AC | PRN
  Administered 2018-04-29: 40 mg via INTRA_ARTICULAR

## 2018-04-29 NOTE — Progress Notes (Signed)
Office Visit Note   Patient: Erik [name]           Date of Birth: January 01, 1946           MRN: [id number] Visit Date: 04/29/2018              Requested by: [name], MD [place], [place] [address] PCP: [name], MD   Assessment & Plan: Visit Diagnoses:  1. Primary osteoarthritis of right hip     Plan: Impression is chronic right hip pain.  He had minimal relief from cortisone injection and he seems to be focusing more on the lateral hip pain.  We injected his trochanteric bursa and the insertion of the abductors today.  We will see how he responds to this.  I did recommend physical therapy but the patient declined for now.  He will follow-up with [place] as needed.  Follow-Up Instructions: Return if symptoms worsen or fail to improve.   Orders:  No orders of the defined types were placed in this encounter.  No orders of the defined types were placed in this encounter.     Procedures: Large Joint Inj: R greater trochanter on 04/29/2018 4:17 PM Indications: pain Details: 22 G needle  Arthrogram: No  Medications: 3 mL lidocaine 1 %; 3 mL bupivacaine 0.5 %; 40 mg methylPREDNISolone acetate 40 MG/ML Patient was prepped and draped in the usual sterile fashion.       Clinical Data: No additional findings.   Subjective: Chief Complaint  Patient presents with  . Right Hip - Pain    Erik [name] comes in for right hip pain.  He has been seeing Dr. Junius Roads for this.  He had an intra-articular steroid injection on 03/28/2018 which helped for a day and a half.  He has groin pain but he states that his lateral hip pain is more symptomatic and this keeps him from walking longer distances.  He has also had previous back surgery.   Review of Systems  Constitutional: Negative.   All other systems reviewed and are negative.    Objective: Vital Signs: There were no vitals taken for this visit.  Physical Exam Vitals signs and nursing note reviewed.    Constitutional:      Appearance: He is well-developed.  Pulmonary:     Effort: Pulmonary effort is normal.  Abdominal:     Palpations: Abdomen is soft.  Skin:    General: Skin is warm.  Neurological:     Mental Status: He is alert and oriented to person, place, and time.  Psychiatric:        Behavior: Behavior normal.        Thought Content: Thought content normal.        Judgment: Judgment normal.     Ortho Exam Right hip exam shows a positive FADIR and logroll and [place].  Lateral hip pain with palpation of the greater trochanter. Specialty Comments:  No specialty comments available.  Imaging: No results found.   PMFS History: Patient Active Problem List   Diagnosis Date Noted  . Neuropathy 07/30/2017  . Prolapsed internal hemorrhoids, grade 2-3 10/19/2016  . Constipation 09/29/2015  . CKD (chronic kidney disease), stage III ([facility])   . Dizziness 06/26/2015  . Long QT interval 06/26/2015  . Visit for monitoring Tikosyn therapy 06/26/2015  . Atypical chest pain 06/26/2015  . Persistent atrial fibrillation 06/21/2015  . Hypertensive heart disease   . Hypothyroidism   . Non-obstructive CAD   .  Carotid disease, bilateral ([place])   . OBESITY 09/20/2008  . Hyperlipidemia 12/17/2005  . Essential hypertension 12/17/2005   Past Medical History:  Diagnosis Date  . Anxiety   . Arthritis   . Atypical chest pain 06/26/2015  . BPH with urinary obstruction   . Carotid disease, bilateral ([facility])    a. 09/2014 Carotid U/S: 1-39% bilat ICA stenosis.  . Chronic lower back pain   . CKD (chronic kidney disease), stage III ([place])    pt. denies  . Diverticulitis 12/2016  . Diverticulosis   . Dyspnea   . Gout    "couple days/year" (05/17/2017)  . History of kidney stones   . Hyperlipidemia   . Hypertension   . Hypertensive heart disease   . Hypothyroidism   . Internal hemorrhoids   . Long QT interval 06/26/2015  . Nephrolithiasis   . Neuropathy 07/30/2017  . Non-obstructive  CAD    a. 07/2002 Cath: LM 20, LAD 28m/d, LCX 50-65m, OM1 15m, RCA 62m, EF 60%; b. 05/2014 MV: low risk w/ small sized, mild intensity rev defect in apical/inferior/infsept area, nl EF->Med Rx.  . Panic attack   . Paroxysmal atrial fibrillation ([facility])    a. Dx 04/2014; b. 05/2014 Echo: Ef 60-65%, no rwma, triv MR/TR, nl RV;  c. CHA2DS2VASc = 3-->was on eliquis, switched to xarelto 04/2015 2/2 cost; d. 05/2015 Tikosyn loaded w/ conversion to AF; e. 06/2015 QTc prolongation and bradycardia->tikosyn d/c'd, PPM placed, Amio started; f. 07/01/2015 In Aflutter @ clinic f/u.  [name] Paroxysmal atrial flutter ([place] )    a. 06/2015 noted to be in rapid Aflutter in Afib clinic-->amio load continued.  . Persistent atrial fibrillation 06/21/2015  . Presence of permanent cardiac pacemaker    St. Jude  . Prolapsed internal hemorrhoids, grade 2-3 10/19/2016   LL Gr 3 and RP/RA Gr 2 LL banded 10/19/2016   . RLL pneumonia ([place]) 11/05/2012  . Sleep apnea    "can't tolerate mask" (05/17/2017)  . Tachy-brady syndrome ([place])    a. 06/27/2015 s/p SJM DC PPM (ser # @ [id number]).    Family History  Problem Relation Age of Onset  . Dementia Mother   . Stroke Father   . Diabetes Paternal Grandmother   . Stroke Paternal Aunt        x 2  . Cancer Paternal Aunt        type unknown  . Colon cancer Neg Hx   . Stomach cancer Neg Hx     Past Surgical History:  Procedure Laterality Date  . ATRIAL FIBRILLATION ABLATION N/A 05/17/2017   Procedure: ATRIAL FIBRILLATION ABLATION;  Surgeon: Constance Haw, MD;  Location: [facility];  Service: Cardiovascular;  Laterality: N/A;  . BACK SURGERY    . CARDIAC CATHETERIZATION  ~ 2000  . CLOSED REDUCTION HAND FRACTURE Right 1984  . COLONOSCOPY  06/27/2015   per Dr. Henrene Pastor, internal hemorrhoids and diverticulae, repeat 10 yrs  . CYSTOSCOPY WITH RETROGRADE PYELOGRAM, URETEROSCOPY AND STENT PLACEMENT Bilateral 02/14/2018   Procedure: CYSTOSCOPY WITH RETROGRADE PYELOGRAM, URETEROSCOPY AND  STENT PLACEMENT;  Surgeon: Alexis Frock, MD;  [facility];  Service: Urology;  Laterality: Bilateral;  . EP IMPLANTABLE DEVICE N/A 06/27/2015   Procedure: Pacemaker Implant;  Surgeon: Evans Lance, MD;  Location: [facility];  Service: Cardiovascular;  Laterality: N/A;  . FRACTURE SURGERY    . HEMORRHOID BANDING    . HOLMIUM LASER APPLICATION Bilateral 85/03/7739   Procedure: HOLMIUM LASER APPLICATION;  Surgeon: Alexis Frock,  MD;  [facility];  Service: Urology;  Laterality: Bilateral;  . INSERT / REPLACE / REMOVE PACEMAKER    . LACERATION REPAIR Right 1984   "hand"  . POSTERIOR LUMBAR FUSION  1998   2 lumbar discs, Dr. Ellene Route; "ray cages"   Social History   Occupational History  . Occupation: retired  Tobacco Use  . Smoking status: Former Smoker    Packs/day: 2.00    Years: 28.00    Pack years: 56.00    Types: Cigarettes    Last attempt to quit: 02/27/1995    Years since quitting: 23.1  . Smokeless tobacco: Never Used  Substance and Sexual Activity  . Alcohol use: Not Currently    Alcohol/week: 0.0 standard drinks    Comment: couple times a month  . Drug use: No  . Sexual activity: Not Currently    Partners: Female

## 2018-06-05 ENCOUNTER — Other Ambulatory Visit: Payer: Self-pay | Admitting: Family Medicine

## 2018-06-05 ENCOUNTER — Telehealth: Payer: Self-pay | Admitting: *Deleted

## 2018-06-05 NOTE — Telephone Encounter (Signed)
                                1. Confirm consent - Calling patient to let them know due to recent COVID19 CDC and Health Department Protocols, we are not seeing patients in the office. We are instead seeing if they would like to schedule this appointment as a  Virtual Appointment VIA Smartphone or Laptop. Patient is aware if they decide to reschedule this appointment, they may not be seen or scheduled for the next 4-6 months. As with many in-office visits we must obtain consent. If you'd like, we can send this to patient's mychart (if signed up).  Otherwise, we can obtain verbal consent now.  All virtual visits are billed to patient's insurance company just like a normal visit.  By patient agreeing to a virtual visit, patient is aware that the technology does not allow provider to perform a hands on physical examination, and thus may limit your provider's ability to fully assess your condition.  Patient is aware we cannot assure that it will always work on either patient or our end, and in the setting of a video visit, we may have to convert it to a phone-only visit.  In either situation, we cannot ensure that we have a secure connection. Is patient willing to proceed?  YES   2. Give patient instructions for WebEx/Doximity download to smartphone as below if video visit   3. Patient advised to be prepared with any vital sign or heart rhythm information, their current medicines, and paper and pen handy for any instructions they may receive before, during, or after their visit.   4. Patient informed they will receive a phone call 10-15 minutes prior to their appointment time (may be from unknown caller ID) so they should be prepared to answer    ACCEPTING  DOXIMITY VIDEO VISITS  - Patient will receive a text message from a Random Number indicating Provider's Name and a Blue Hyperlink.  Text Message will say Provider sent you a secure message with a Blue Hyperlink Attached  -Patient is to click on  Blue Hyperlink Text message will say "Provider will see you now. Click to join Video call." with another Blue Hyperlink attached.  -Patient is to click Blue hyperlink to Join Video call with said Provider.             

## 2018-06-05 NOTE — Telephone Encounter (Signed)
Spoke with both patient and patient's wife.  Doximity Virtual Visit will take place on wife's cell phone. Patient and wife do not know how to do WebEx or add it to her phone.

## 2018-06-13 NOTE — Telephone Encounter (Signed)
Virtual Visit Pre-Appointment Phone Call  Steps For Call:  1. Confirm consent - "In the setting of the current Covid19 crisis, you are scheduled for a (phone or video) visit with your provider on (date) at (time).  Just as we do with many in-office visits, in order for you to participate in this visit, we must obtain consent.  If you'd like, I can send this to your mychart (if signed up) or email for you to review.  Otherwise, I can obtain your verbal consent now.  All virtual visits are billed to your insurance company just like a normal visit would be.  By agreeing to a virtual visit, we'd like you to understand that the technology does not allow for your provider to perform an examination, and thus may limit your provider's ability to fully assess your condition. If your provider identifies any concerns that need to be evaluated in person, we will make arrangements to do so.  Finally, though the technology is pretty good, we cannot assure that it will always work on either your or our end, and in the setting of a video visit, we may have to convert it to a phone-only visit.  In either situation, we cannot ensure that we have a secure connection.  Are you willing to proceed?" STAFF: Did the patient verbally acknowledge consent to telehealth visit? Document YES/NO here: YES  2. Confirm the BEST phone number to call the day of the visit by including in appointment notes  3. Give patient instructions for WebEx/MyChart download to smartphone as below or Doximity/Doxy.me if video visit (depending on what platform provider is using)  4. Advise patient to be prepared with their blood pressure, heart rate, weight, any heart rhythm information, their current medicines, and a piece of paper and pen handy for any instructions they may receive the day of their visit  5. Inform patient they will receive a phone call 15 minutes prior to their appointment time (may be from unknown caller ID) so they should be  prepared to answer  6. Confirm that appointment type is correct in Epic appointment notes (VIDEO vs PHONE)     TELEPHONE CALL NOTE  Erik [name] has been deemed a candidate for a follow-up tele-health visit to limit community exposure during the Covid-19 pandemic. I spoke with the patient via phone to ensure availability of phone/video source, confirm preferred email & phone number, and discuss instructions and expectations.  I reminded Erik [name] to be prepared with any vital sign and/or heart rhythm information that could potentially be obtained via home monitoring, at the time of his visit. I reminded Erik [name] to expect a phone call at the time of his visit if his visit.  Stanton Kidney, RN 06/13/2018 12:06 PM   INSTRUCTIONS FOR DOWNLOADING THE [facility] APP TO SMARTPHONE  - If Apple, ask patient to go to [employer] and type in WebEx in the search bar. [employer], the blue/green circle. If Android, go to [employer] and type in [employer] in the search bar. The app is free but as with any other app downloads, their phone may require them to verify saved payment information or Apple/Android password.  - The patient does NOT have to create an account. - On the day of the visit, the assist will walk the patient through joining the meeting with the meeting number/password.  INSTRUCTIONS FOR DOWNLOADING THE MYCHART APP TO SMARTPHONE  - The patient must first  make sure to have activated MyChart and know their login information - If Apple, go to [employer] and type in MyChart in the search bar and download the app. If Android, ask patient to go to [employer] and type in [place] in the search bar and download the app. The app is free but as with any other app downloads, their phone may require them to verify saved payment information or Apple/Android password.  - The patient will need to then log into the app with their MyChart username and password, and  select [facility] as their healthcare provider to link the account. When it is time for your visit, go to the MyChart app, find appointments, and click Begin Video Visit. Be sure to Select Allow for your device to access the Microphone and Camera for your visit. You will then be connected, and your provider will be with you shortly.  **If they have any issues connecting, or need assistance please contact MyChart service desk [phone])**  **If using a computer, in order to ensure the best quality for their visit they will need to use either of the following Internet Browsers: [employer], or Google Chrome**  IF USING DOXIMITY or DOXY.ME - The patient will receive a link just prior to their visit, either by text or email (to be determined day of appointment depending on if it's doxy.me or Doximity).     FULL LENGTH CONSENT FOR TELE-HEALTH VISIT   I hereby voluntarily request, consent and authorize [facility] and its employed or contracted physicians, physician assistants, nurse practitioners or other licensed health care professionals (the Practitioner), to provide me with telemedicine health care services (the "Services") as deemed necessary by the treating Practitioner. I acknowledge and consent to receive the Services by the Practitioner via telemedicine. I understand that the telemedicine visit will involve communicating with the Practitioner through live audiovisual communication technology and the disclosure of certain medical information by electronic transmission. I acknowledge that I have been given the opportunity to request an in-person assessment or other available alternative prior to the telemedicine visit and am voluntarily participating in the telemedicine visit.  I understand that I have the right to withhold or withdraw my consent to the use of telemedicine in the course of my care at any time, without affecting my right to future care or treatment, and that  the Practitioner or I may terminate the telemedicine visit at any time. I understand that I have the right to inspect all information obtained and/or recorded in the course of the telemedicine visit and may receive copies of available information for a reasonable fee.  I understand that some of the potential risks of receiving the Services via telemedicine include:  [name] Delay or interruption in medical evaluation due to technological equipment failure or disruption; . Information transmitted may not be sufficient (e.g. poor resolution of images) to allow for appropriate medical decision making by the Practitioner; and/or  . In rare instances, security protocols could fail, causing a breach of personal health information.  Furthermore, I acknowledge that it is my responsibility to provide information about my medical history, conditions and care that is complete and accurate to the best of my ability. I acknowledge that Practitioner's advice, recommendations, and/or decision may be based on factors not within their control, such as incomplete or inaccurate data provided by me or distortions of diagnostic images or specimens that may result from electronic transmissions. I understand that the practice of medicine is not an  exact science and that Practitioner makes no warranties or guarantees regarding treatment outcomes. I acknowledge that I will receive a copy of this consent concurrently upon execution via email to the email address I last provided but may also request a printed copy by calling the office of [facility].    I understand that my insurance will be billed for this visit.   I have read or had this consent read to me. . I understand the contents of this consent, which adequately explains the benefits and risks of the Services being provided via telemedicine.  . I have been provided ample opportunity to ask questions regarding this consent and the Services and have had my questions answered to  my satisfaction. . I give my informed consent for the services to be provided through the use of telemedicine in my medical care  By participating in this telemedicine visit I agree to the above.

## 2018-06-16 ENCOUNTER — Encounter: Payer: Self-pay | Admitting: Cardiology

## 2018-06-16 ENCOUNTER — Other Ambulatory Visit: Payer: Self-pay

## 2018-06-16 ENCOUNTER — Telehealth (INDEPENDENT_AMBULATORY_CARE_PROVIDER_SITE_OTHER): Payer: Medicare Other | Admitting: Cardiology

## 2018-06-16 DIAGNOSIS — I4819 Other persistent atrial fibrillation: Secondary | ICD-10-CM

## 2018-06-16 MED ORDER — DILTIAZEM HCL ER COATED BEADS 300 MG PO CP24: 300.0000 mg | ORAL_CAPSULE | Freq: Every day | ORAL | 2 refills | Status: DC

## 2018-06-16 NOTE — Addendum Note (Signed)
Addended by: Stanton Kidney on: 06/16/2018 02:45 PM   Modules accepted: Orders

## 2018-06-16 NOTE — Progress Notes (Signed)
Electrophysiology TeleHealth Note   Due to national recommendations of social distancing due to COVID 19, an audio/video telehealth visit is felt to be most appropriate for this patient at this time.  See Epic message for the patient's consent to telehealth for [facility].   Date:  06/16/2018   ID:  Erik [name], DOB [birth year], MRN [id number]  Location: patient's home  Provider location: [address] [place]  Evaluation Performed: Follow-up visit  PCP:  [name], MD  Cardiologist:  [name] Electrophysiologist:  Dr Curt Bears  Chief Complaint:  AF  History of Present Illness:    Erik [name] is a 73 y.o. male who presents via audio/video conferencing for a telehealth visit today.  Since last being seen in our clinic, the patient reports doing very well.  Today, he denies symptoms of palpitations, chest pain, shortness of breath,  lower extremity edema, dizziness, presyncope, or syncope.  The patient is otherwise without complaint today.  The patient denies symptoms of fevers, chills, cough, or new SOB worrisome for COVID 19.  He has a history of atrial fibrillation, nonobstructive coronary artery disease, hypertension, hyperlipidemia, and hypothyroidism.  March 2016 he was diagnosed with atrial fibrillation in the setting of chest pain.  He was loaded on dofetilide.  He went back into atrial fibrillation and his dofetilide was stopped.  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  Today, denies symptoms of palpitations, chest pain, shortness of breath, orthopnea, PND, lower extremity edema, claudication, dizziness, presyncope, syncope, bleeding, or neurologic sequela. The patient is tolerating medications without difficulties.  Overall he is feeling well.  He has noted a little bit more shortness of breath over the past month.  He does say that he has gained 10 pounds.  He attributes  his shortness of breath to increased weight.  He is going to work to lose the weight.  He says that he has been eating quite a bit more sense of the stay-at-home border.  Past Medical History:  Diagnosis Date  . Anxiety   . Arthritis   . Atypical chest pain 06/26/2015  . BPH with urinary obstruction   . Carotid disease, bilateral ([place])    a. 09/2014 Carotid U/S: 1-39% bilat ICA stenosis.  . Chronic lower back pain   . CKD (chronic kidney disease), stage III ([place])    pt. denies  . Diverticulitis 12/2016  . Diverticulosis   . Dyspnea   . Gout    "couple days/year" (05/17/2017)  . History of kidney stones   . Hyperlipidemia   . Hypertension   . Hypertensive heart disease   . Hypothyroidism   . Internal hemorrhoids   . Long QT interval 06/26/2015  . Nephrolithiasis   . Neuropathy 07/30/2017  . Non-obstructive CAD    a. 07/2002 Cath: LM 20, LAD 48m/d, LCX 50-66m, OM1 25m, RCA 4m, EF 60%; b. 05/2014 MV: low risk w/ small sized, mild intensity rev defect in apical/inferior/infsept area, nl EF->Med Rx.  . Panic attack   . Paroxysmal atrial fibrillation ([place])    a. Dx 04/2014; b. 05/2014 Echo: Ef 60-65%, no rwma, triv MR/TR, nl RV;  c. CHA2DS2VASc = 3-->was on eliquis, switched to xarelto 04/2015 2/2 cost; d. 05/2015 Tikosyn loaded w/ conversion to AF; e. 06/2015 QTc prolongation and bradycardia->tikosyn d/c'd, PPM placed, Amio started; f. 07/01/2015 In Aflutter @ clinic f/u.  [name] Paroxysmal atrial flutter ([place])  a. 06/2015 noted to be in rapid Aflutter in Afib clinic-->amio load continued.  . Persistent atrial fibrillation 06/21/2015  . Presence of permanent cardiac pacemaker    St. Jude  . Prolapsed internal hemorrhoids, grade 2-3 10/19/2016   LL Gr 3 and RP/RA Gr 2 LL banded 10/19/2016   . RLL pneumonia ([place]) 11/05/2012  . Sleep apnea    "can't tolerate mask" (05/17/2017)  . Tachy-brady syndrome ([place])    a. 06/27/2015 s/p SJM DC PPM (ser # @ [id number]).    Past Surgical History:  Procedure  Laterality Date  . ATRIAL FIBRILLATION ABLATION N/A 05/17/2017   Procedure: ATRIAL FIBRILLATION ABLATION;  Surgeon: Constance Haw, MD;  Location: [facility];  Service: Cardiovascular;  Laterality: N/A;  . BACK SURGERY    . CARDIAC CATHETERIZATION  ~ 2000  . CLOSED REDUCTION HAND FRACTURE Right 1984  . COLONOSCOPY  06/27/2015   per Dr. Henrene Pastor, internal hemorrhoids and diverticulae, repeat 10 yrs  . CYSTOSCOPY WITH RETROGRADE PYELOGRAM, URETEROSCOPY AND STENT PLACEMENT Bilateral 02/14/2018   Procedure: CYSTOSCOPY WITH RETROGRADE PYELOGRAM, URETEROSCOPY AND STENT PLACEMENT;  Surgeon: Alexis Frock, MD;  Location: [facility];  Service: Urology;  Laterality: Bilateral;  . EP IMPLANTABLE DEVICE N/A 06/27/2015   Procedure: Pacemaker Implant;  Surgeon: Evans Lance, MD;  Location: [facility];  Service: Cardiovascular;  Laterality: N/A;  . FRACTURE SURGERY    . HEMORRHOID BANDING    . HOLMIUM LASER APPLICATION Bilateral 85/46/2703   Procedure: HOLMIUM LASER APPLICATION;  Surgeon: Alexis Frock, MD;  Location: [facility];  Service: Urology;  Laterality: Bilateral;  . INSERT / REPLACE / REMOVE PACEMAKER    . LACERATION REPAIR Right 1984   "hand"  . POSTERIOR LUMBAR FUSION  1998   2 lumbar discs, Dr. Ellene Route; "ray cages"    Current Outpatient Medications  Medication Sig Dispense Refill  . acetaminophen (TYLENOL) 500 MG tablet Take 1,000 mg by mouth every 6 (six) hours as needed.    . diltiazem (CARDIZEM CD) 300 MG 24 hr capsule TAKE 1 CAPSULE BY MOUTH DAILY. 90 capsule 3  . diltiazem (CARDIZEM) 30 MG tablet Cardizem 30mg  -- take 1 tablet every 4 hours AS NEEDED for heart rate >100 as long as blood pressure >100. 90 tablet 1  . gabapentin (NEURONTIN) 600 MG tablet Take 1 tablet (600 mg total) by mouth 2 (two) times daily. 180 tablet 1  . hydrochlorothiazide (HYDRODIURIL) 25 MG tablet Take 1 tablet (25 mg total) by mouth daily. 90 tablet 3  . ibuprofen (ADVIL,MOTRIN) 200 MG tablet  Take 200 mg by mouth every 8 (eight) hours as needed for headache or moderate pain.     [name] ketorolac (TORADOL) 10 MG tablet Take 1 tablet (10 mg total) by mouth every 6 (six) hours as needed for moderate pain. Post-operatively (pt has tolerated prior) 20 tablet 0  . levothyroxine (SYNTHROID, LEVOTHROID) 50 MCG tablet TAKE 1 TABLET (50 MCG TOTAL) BY MOUTH DAILY. 90 tablet 3  . losartan (COZAAR) 100 MG tablet Take 1 tablet (100 mg total) by mouth daily. 90 tablet 3  . oxyCODONE-acetaminophen (PERCOCET) 10-325 MG tablet Take 1 tablet by mouth every 4 (four) hours as needed for pain. Post-operatively (severe pain) 30 tablet 0  . oxymetazoline (AFRIN) 0.05 % nasal spray Place 2 sprays into both nostrils at bedtime as needed for congestion.    . simvastatin (ZOCOR) 20 MG tablet TAKE 1 TABLET BY MOUTH EVERY DAY 90 tablet 2  . tamsulosin (FLOMAX) 0.4  MG CAPS capsule TAKE 1 CAPSULE BY MOUTH EVERY DAY 90 capsule 3  . XARELTO 20 MG TABS tablet TAKE 1 TABLET (20 MG TOTAL) BY MOUTH DAILY WITH SUPPER. (Patient taking differently: Take 20 mg by mouth daily with supper. ) 90 tablet 2   No current facility-administered medications for this visit.     Allergies:   Patient has no known allergies.   Social History:  The patient  reports that he quit smoking about 23 years ago. His smoking use included cigarettes. He has a 56.00 pack-year smoking history. He has never used smokeless tobacco. He reports previous alcohol use. He reports that he does not use drugs.   Family History:  The patient's  family history includes Cancer in his paternal aunt; Dementia in his mother; Diabetes in his paternal grandmother; Stroke in his father and paternal aunt.   ROS:  Please see the history of present illness.   All other systems are personally reviewed and negative.    Exam:    Vital Signs:  There were no vitals taken for this visit.  Well appearing, alert and conversant, regular work of breathing,  good skin color Eyes-  anicteric, neuro- grossly intact, skin- no apparent rash or lesions or cyanosis, mouth- oral mucosa is pink   Labs/Other Tests and Data Reviewed:    Recent Labs: 07/23/2017: TSH 3.64 08/20/2017: NT-Pro BNP 90 01/24/2018: ALT 30 02/12/2018: BUN 21; Creatinine, Ser 1.41; Hemoglobin 14.2; Platelets 213; Potassium 3.8; Sodium 142   Wt Readings from Last 3 Encounters:  03/27/18 262 lb 4 oz (119 kg)  03/03/18 250 lb 2 oz (113.5 kg)  02/14/18 260 lb 2.3 oz (118 kg)     Other studies personally reviewed: Additional studies/ records that were reviewed today include: ECG 08/20/2017 personally reviewed Review of the above records today demonstrates:  Atrial paced rate 67  Last device remote is reviewed from [facility] PDF dated 03/27/2018 which reveals normal device function, no arrhythmias    ASSESSMENT & PLAN:    1.  Persistent atrial fibrillation: Status post ablation 05/17/2017.  On Xarelto and diltiazem.  AF burden less than 1%.No changes at this time  This patients CHA2DS2-VASc Score and unadjusted Ischemic Stroke Rate (% per year) is equal to 2.2 % stroke rate/year from a score of 2  Above score calculated as 1 point each if present [CHF, HTN, DM, Vascular=MI/PAD/Aortic Plaque, Age if 65-74, or Male] Above score calculated as 2 points each if present [Age > 75, or Stroke/TIA/TE]  2.  Hypertension: Blood pressure is elevated today.  It is been completely normal on prior checks in clinic.  Due to that I Wynelle Dreier make no changes.  3.  Sinus node dysfunction: Status post Saint Jude dual-chamber pacemaker.  Device functioning appropriately.   COVID 19 screen The patient denies symptoms of COVID 19 at this time.  The importance of social distancing was discussed today.  Follow-up: 6 months Next remote: 06/23/18  Current medicines are reviewed at length with the patient today.   The patient does not have concerns regarding his medicines.  The following changes were made today:  none  Labs/  tests ordered today include:  No orders of the defined types were placed in this encounter.  Due to the patient having issues connecting to the video chat, this visit was switched to a telephone call.  Patient Risk:  after full review of this patients clinical status, I feel that they are at moderate risk at this time.  Today,  I have spent 12 minutes with the patient with telehealth technology discussing atrial fibrillation, hypertension.    Signed, Seanne Chirico [name], MD  06/16/2018 2:15 PM     [address] [place] [address] [phone] (office) [phone] (fax)

## 2018-06-21 ENCOUNTER — Other Ambulatory Visit: Payer: Self-pay | Admitting: Family Medicine

## 2018-06-23 ENCOUNTER — Other Ambulatory Visit: Payer: Self-pay

## 2018-06-23 ENCOUNTER — Ambulatory Visit (INDEPENDENT_AMBULATORY_CARE_PROVIDER_SITE_OTHER): Payer: Medicare Other | Admitting: *Deleted

## 2018-06-23 DIAGNOSIS — R001 Bradycardia, unspecified: Secondary | ICD-10-CM

## 2018-06-23 DIAGNOSIS — I495 Sick sinus syndrome: Secondary | ICD-10-CM

## 2018-06-24 LAB — CUP PACEART REMOTE DEVICE CHECK
Battery Remaining Longevity: 115 mo
Battery Remaining Percentage: 95.5 %
Battery Voltage: 2.99 V
Brady Statistic AP VP Percent: 1 %
Brady Statistic AP VS Percent: 83 %
Brady Statistic AS VP Percent: 1 %
Brady Statistic AS VS Percent: 16 %
Brady Statistic RA Percent Paced: 83 %
Brady Statistic RV Percent Paced: 1 %
Date Time Interrogation Session: 20200427060013
Implantable Lead Implant Date: 20170501
Implantable Lead Implant Date: 20170501
Implantable Lead Location: 753859
Implantable Lead Location: 753860
Implantable Pulse Generator Implant Date: 20170501
Lead Channel Impedance Value: 440 Ohm
Lead Channel Impedance Value: 530 Ohm
Lead Channel Pacing Threshold Amplitude: 0.75 V
Lead Channel Pacing Threshold Amplitude: 0.875 V
Lead Channel Pacing Threshold Pulse Width: 0.5 ms
Lead Channel Pacing Threshold Pulse Width: 0.5 ms
Lead Channel Sensing Intrinsic Amplitude: 10.9 mV
Lead Channel Sensing Intrinsic Amplitude: 3.4 mV
Lead Channel Setting Pacing Amplitude: 1.875
Lead Channel Setting Pacing Amplitude: 2.5 V
Lead Channel Setting Pacing Pulse Width: 0.5 ms
Lead Channel Setting Sensing Sensitivity: 2 mV
Pulse Gen Model: 2272
Pulse Gen Serial Number: 7897836

## 2018-07-01 NOTE — Progress Notes (Signed)
Remote pacemaker transmission.   

## 2018-07-25 ENCOUNTER — Other Ambulatory Visit: Payer: Self-pay

## 2018-07-25 ENCOUNTER — Ambulatory Visit (INDEPENDENT_AMBULATORY_CARE_PROVIDER_SITE_OTHER): Payer: Medicare Other | Admitting: Family Medicine

## 2018-07-25 ENCOUNTER — Encounter: Payer: Self-pay | Admitting: Family Medicine

## 2018-07-25 VITALS — BP 130/58 | HR 67 | Temp 98.5°F | Wt 261.2 lb

## 2018-07-25 DIAGNOSIS — L03116 Cellulitis of left lower limb: Secondary | ICD-10-CM

## 2018-07-25 MED ORDER — PREDNISONE 10 MG PO TABS: 10.0000 mg | ORAL_TABLET | Freq: Two times a day (BID) | ORAL | 0 refills | Status: DC | PRN

## 2018-07-25 MED ORDER — CEPHALEXIN 500 MG PO CAPS: 500.0000 mg | ORAL_CAPSULE | Freq: Four times a day (QID) | ORAL | 0 refills | Status: AC

## 2018-07-25 NOTE — Progress Notes (Signed)
Subjective:    Patient ID: Erik [name], male    DOB: 1945/04/23, 73 y.o.   MRN: [id number]    HPI: Here for a tender lump on the left lower leg that he thinks may be infected. It started off looking like a bruise, though he does not recall any particular trauma to the area. He feel fine in general, no fever. The area has turned red and is warm.    ROS: See pertinent positives and negatives per HPI.  Past Medical History:  Diagnosis Date  . Anxiety   . Arthritis   . Atypical chest pain 06/26/2015  . BPH with urinary obstruction   . Carotid disease, bilateral ([place])    a. 09/2014 Carotid U/S: 1-39% bilat ICA stenosis.  . Chronic lower back pain   . CKD (chronic kidney disease), stage III ([place])    pt. denies  . Diverticulitis 12/2016  . Diverticulosis   . Dyspnea   . Gout    "couple days/year" (05/17/2017)  . History of kidney stones   . Hyperlipidemia   . Hypertension   . Hypertensive heart disease   . Hypothyroidism   . Internal hemorrhoids   . Long QT interval 06/26/2015  . Nephrolithiasis   . Neuropathy 07/30/2017  . Non-obstructive CAD    a. 07/2002 Cath: LM 20, LAD 95m/d, LCX 50-55m, OM1 48m, RCA 21m, EF 60%; b. 05/2014 MV: low risk w/ small sized, mild intensity rev defect in apical/inferior/infsept area, nl EF->Med Rx.  . Panic attack   . Paroxysmal atrial fibrillation ([place])    a. Dx 04/2014; b. 05/2014 Echo: Ef 60-65%, no rwma, triv MR/TR, nl RV;  c. CHA2DS2VASc = 3-->was on eliquis, switched to xarelto 04/2015 2/2 cost; d. 05/2015 Tikosyn loaded w/ conversion to AF; e. 06/2015 QTc prolongation and bradycardia->tikosyn d/c'd, PPM placed, Amio started; f. 07/01/2015 In Aflutter @ clinic f/u.  [name] Paroxysmal atrial flutter ([place])    a. 06/2015 noted to be in rapid Aflutter in Afib clinic-->amio load continued.  . Persistent atrial fibrillation 06/21/2015  . Presence of permanent cardiac pacemaker    St. Jude  . Prolapsed internal hemorrhoids, grade 2-3 10/19/2016   LL Gr 3 and  RP/RA Gr 2 LL banded 10/19/2016   . RLL pneumonia ([place]) 11/05/2012  . Sleep apnea    "can't tolerate mask" (05/17/2017)  . Tachy-brady syndrome ([place])    a. 06/27/2015 s/p SJM DC PPM (ser # @ [id number]).    Past Surgical History:  Procedure Laterality Date  . ATRIAL FIBRILLATION ABLATION N/A 05/17/2017   Procedure: ATRIAL FIBRILLATION ABLATION;  Surgeon: Constance Haw, MD;  Location: [facility];  Service: Cardiovascular;  Laterality: N/A;  . BACK SURGERY    . CARDIAC CATHETERIZATION  ~ 2000  . CLOSED REDUCTION HAND FRACTURE Right 1984  . COLONOSCOPY  06/27/2015   per Dr. Henrene Pastor, internal hemorrhoids and diverticulae, repeat 10 yrs  . CYSTOSCOPY WITH RETROGRADE PYELOGRAM, URETEROSCOPY AND STENT PLACEMENT Bilateral 02/14/2018   Procedure: CYSTOSCOPY WITH RETROGRADE PYELOGRAM, URETEROSCOPY AND STENT PLACEMENT;  Surgeon: Alexis Frock, MD;  [facility]: [facility];  Service: Urology;  Laterality: Bilateral;  . EP IMPLANTABLE DEVICE N/A 06/27/2015   Procedure: Pacemaker Implant;  Surgeon: Evans Lance, MD;  Location: [facility];  Service: Cardiovascular;  Laterality: N/A;  . FRACTURE SURGERY    . HEMORRHOID BANDING    . HOLMIUM LASER APPLICATION Bilateral 73/53/2992   Procedure: HOLMIUM LASER APPLICATION;  Surgeon: Alexis Frock, MD;  Location:  [facility];  Service: Urology;  Laterality: Bilateral;  . INSERT / REPLACE / REMOVE PACEMAKER    . LACERATION REPAIR Right 1984   "hand"  . POSTERIOR LUMBAR FUSION  1998   2 lumbar discs, Dr. Ellene Route; "ray cages"    Family History  Problem Relation Age of Onset  . Dementia Mother   . Stroke Father   . Diabetes Paternal Grandmother   . Stroke Paternal Aunt        x 2  . Cancer Paternal Aunt        type unknown  . Colon cancer Neg Hx   . Stomach cancer Neg Hx      Current Outpatient Medications:  .  acetaminophen (TYLENOL) 500 MG tablet, Take 1,000 mg by mouth every 6 (six) hours as needed., Disp: , Rfl:  .  diltiazem (CARDIZEM  CD) 300 MG 24 hr capsule, Take 1 capsule (300 mg total) by mouth daily., Disp: 90 capsule, Rfl: 2 .  diltiazem (CARDIZEM) 30 MG tablet, Cardizem 30mg  -- take 1 tablet every 4 hours AS NEEDED for heart rate >100 as long as blood pressure >100., Disp: 90 tablet, Rfl: 1 .  gabapentin (NEURONTIN) 600 MG tablet, TAKE 1 TABLET BY MOUTH TWICE A DAY, Disp: 180 tablet, Rfl: 1 .  hydrochlorothiazide (HYDRODIURIL) 25 MG tablet, Take 1 tablet (25 mg total) by mouth daily., Disp: 90 tablet, Rfl: 3 .  ibuprofen (ADVIL,MOTRIN) 200 MG tablet, Take 200 mg by mouth every 8 (eight) hours as needed for headache or moderate pain. , Disp: , Rfl:  .  ketorolac (TORADOL) 10 MG tablet, Take 1 tablet (10 mg total) by mouth every 6 (six) hours as needed for moderate pain. Post-operatively (pt has tolerated prior), Disp: 20 tablet, Rfl: 0 .  levothyroxine (SYNTHROID, LEVOTHROID) 50 MCG tablet, TAKE 1 TABLET (50 MCG TOTAL) BY MOUTH DAILY., Disp: 90 tablet, Rfl: 3 .  losartan (COZAAR) 100 MG tablet, Take 1 tablet (100 mg total) by mouth daily., Disp: 90 tablet, Rfl: 3 .  oxyCODONE-acetaminophen (PERCOCET) 10-325 MG tablet, Take 1 tablet by mouth every 4 (four) hours as needed for pain. Post-operatively (severe pain), Disp: 30 tablet, Rfl: 0 .  oxymetazoline (AFRIN) 0.05 % nasal spray, Place 2 sprays into both nostrils at bedtime as needed for congestion., Disp: , Rfl:  .  simvastatin (ZOCOR) 20 MG tablet, TAKE 1 TABLET BY MOUTH EVERY DAY, Disp: 90 tablet, Rfl: 2 .  tamsulosin (FLOMAX) 0.4 MG CAPS capsule, TAKE 1 CAPSULE BY MOUTH EVERY DAY, Disp: 90 capsule, Rfl: 3 .  XARELTO 20 MG TABS tablet, TAKE 1 TABLET (20 MG TOTAL) BY MOUTH DAILY WITH SUPPER. (Patient taking differently: Take 20 mg by mouth daily with supper. ), Disp: 90 tablet, Rfl: 2 .  cephALEXin (KEFLEX) 500 MG capsule, Take 1 capsule (500 mg total) by mouth 4 (four) times daily for 10 days., Disp: 40 capsule, Rfl: 0 .  predniSONE (DELTASONE) 10 MG tablet, Take 1 tablet  (10 mg total) by mouth 2 (two) times daily as needed (joint pains)., Disp: 60 tablet, Rfl: 0   ASSESSMENT AND PLAN:  Discussed the following assessment and plan:  No diagnosis found.     I discussed the assessment and treatment plan with the patient. The patient was provided an opportunity to ask questions and all were answered. The patient agreed with the plan and demonstrated an understanding of the instructions.   The patient was advised to call back or seek an in-person evaluation if the  symptoms worsen or if the condition fails to improve as anticipated.     Review of Systems  Constitutional: Negative.   Respiratory: Negative.   Cardiovascular: Negative.   Skin: Positive for color change.       Objective:   Physical Exam Constitutional:      Appearance: Normal appearance.  Cardiovascular:     Rate and Rhythm: Normal rate and regular rhythm.     Pulses: Normal pulses.     Heart sounds: Normal heart sounds.  Pulmonary:     Effort: Pulmonary effort is normal.     Breath sounds: Normal breath sounds.  Skin:    Comments: The anterior lower left leg has a 5 cm area of erythema, warmth and tenderness with a small firm nodular area in the center. No streaking   Neurological:     Mental Status: He is alert.           Assessment & Plan:  This appears to be a contusion that has become infected. Treat with Keflex and some Prednisone. He may apply warm compresses to the area. Recheck prn.  [name], MD

## 2018-07-29 ENCOUNTER — Other Ambulatory Visit: Payer: Self-pay | Admitting: Physician Assistant

## 2018-08-06 ENCOUNTER — Telehealth: Payer: Self-pay | Admitting: Family Medicine

## 2018-08-06 NOTE — Telephone Encounter (Signed)
Copied from [place] [id number]. Topic: Quick Communication - Rx Refill/Question >> Aug 06, 2018  3:02 PM Waylan Rocher, Lumin L wrote: Medication: cephALEXin (KEFLEX) 500 MG capsule (bump on leg did shrink but the bump is still discolored and visible)  Has the patient contacted their pharmacy? {yes  (Agent: If no, request that the patient contact the pharmacy for the refill.) (Agent: If yes, when and what did the pharmacy advise?)  Preferred Pharmacy (with phone number or street name): [facility] [place] - [address] [place] [address] Phone: [phone] Fax: [phone]  Agent: Please be advised that RX refills may take up to 3 business days. We ask that you follow-up with your pharmacy.

## 2018-08-06 NOTE — Telephone Encounter (Signed)
Dr. Sarajane Jews please advise on refill of keflex--pt stated that the bump on his leg shrunk but is still discolored.

## 2018-08-07 MED ORDER — CEPHALEXIN 500 MG PO CAPS: 500.0000 mg | ORAL_CAPSULE | Freq: Three times a day (TID) | ORAL | 0 refills | Status: DC

## 2018-08-07 NOTE — Telephone Encounter (Signed)
Call in Keflex 500 mg TID for 10 days

## 2018-08-07 NOTE — Telephone Encounter (Signed)
meds have been sent to the pharmacy

## 2018-08-16 ENCOUNTER — Other Ambulatory Visit: Payer: Self-pay | Admitting: Family Medicine

## 2018-08-18 ENCOUNTER — Other Ambulatory Visit: Payer: Self-pay | Admitting: *Deleted

## 2018-08-18 MED ORDER — RIVAROXABAN 20 MG PO TABS: 20.0000 mg | ORAL_TABLET | Freq: Every day | ORAL | 0 refills | Status: DC

## 2018-08-18 NOTE — Telephone Encounter (Signed)
Pt is a 73yom requesting xarelto 20mg . Pt wt 118.5kg, 1.41(02/12/18), lov w/camnitz (06/16/18), ccr 73ml/min. Would refill for 70mos dxafib

## 2018-08-19 ENCOUNTER — Other Ambulatory Visit: Payer: Self-pay | Admitting: Family Medicine

## 2018-09-12 ENCOUNTER — Other Ambulatory Visit: Payer: Self-pay | Admitting: Family Medicine

## 2018-09-16 ENCOUNTER — Encounter: Payer: Self-pay | Admitting: Orthopaedic Surgery

## 2018-09-16 ENCOUNTER — Ambulatory Visit (INDEPENDENT_AMBULATORY_CARE_PROVIDER_SITE_OTHER): Payer: Medicare Other | Admitting: Orthopaedic Surgery

## 2018-09-16 DIAGNOSIS — M4807 Spinal stenosis, lumbosacral region: Secondary | ICD-10-CM

## 2018-09-16 DIAGNOSIS — M5416 Radiculopathy, lumbar region: Secondary | ICD-10-CM

## 2018-09-16 DIAGNOSIS — M25551 Pain in right hip: Secondary | ICD-10-CM

## 2018-09-16 NOTE — Progress Notes (Signed)
Office Visit Note   Patient: Erik [name]           Date of Birth: 1946-01-28           MRN: [id number] Visit Date: 09/16/2018              Requested by: [name], MD [place],  [place] [id number] PCP: [name], MD   Assessment & Plan: Visit Diagnoses:  1. Pain in right hip   2. Lumbar radiculopathy     Plan: Impression is right hip pain with lumbar radiculopathy.  At this point we will need advanced imaging to better evaluate for structural abnormalities.  Patient has a pacemaker therefore we will order a CT arthrogram of the right hip as well as CT myelogram of the lumbar spine.  Follow-up in a couple weeks to review those studies.  Follow-Up Instructions: Return in about 2 weeks (around 09/30/2018).   Orders:  No orders of the defined types were placed in this encounter.  No orders of the defined types were placed in this encounter.     Procedures: No procedures performed   Clinical Data: No additional findings.   Subjective: Chief Complaint  Patient presents with  . Right Hip - Follow-up    [name] returns today for continued right hip pain and low back pain.  He states that he has groin pain as well as lateral hip pain as well as low back pain that radiates down his leg.  Some aspects of his pain are very reminiscent of his previous back pain that he ultimately had surgery for.  In terms of his hip the intra-articular injection gave him temporary relief and the trochanteric injection I gave him gave him about about 10 weeks of relief.  He states that he is back in pain and he can barely walk.   Review of Systems  Constitutional: Negative.   All other systems reviewed and are negative.    Objective: Vital Signs: There were no vitals taken for this visit.  Physical Exam Vitals signs and nursing note reviewed.  Constitutional:      Appearance: He is well-developed.  Pulmonary:     Effort: Pulmonary effort is normal.   Abdominal:     Palpations: Abdomen is soft.  Skin:    General: Skin is warm.  Neurological:     Mental Status: He is alert and oriented to person, place, and time.  Psychiatric:        Behavior: Behavior normal.        Thought Content: Thought content normal.        Judgment: Judgment normal.     Ortho Exam Right hip exam is unchanged. Specialty Comments:  No specialty comments available.  Imaging: No results found.   PMFS History: Patient Active Problem List   Diagnosis Date Noted  . Neuropathy 07/30/2017  . Prolapsed internal hemorrhoids, grade 2-3 10/19/2016  . Constipation 09/29/2015  . CKD (chronic kidney disease), stage III ([place])   . Dizziness 06/26/2015  . Long QT interval 06/26/2015  . Visit for monitoring Tikosyn therapy 06/26/2015  . Atypical chest pain 06/26/2015  . Persistent atrial fibrillation 06/21/2015  . Hypertensive heart disease   . Hypothyroidism   . Non-obstructive CAD   . Carotid disease, bilateral ([place])   . OBESITY 09/20/2008  . Hyperlipidemia 12/17/2005  . Essential hypertension 12/17/2005   Past Medical History:  Diagnosis Date  . Anxiety   . Arthritis   .  Atypical chest pain 06/26/2015  . BPH with urinary obstruction   . Carotid disease, bilateral ([place])    a. 09/2014 Carotid U/S: 1-39% bilat ICA stenosis.  . Chronic lower back pain   . CKD (chronic kidney disease), stage III ([place])    pt. denies  . Diverticulitis 12/2016  . Diverticulosis   . Dyspnea   . Gout    "couple days/year" (05/17/2017)  . History of kidney stones   . Hyperlipidemia   . Hypertension   . Hypertensive heart disease   . Hypothyroidism   . Internal hemorrhoids   . Long QT interval 06/26/2015  . Nephrolithiasis   . Neuropathy 07/30/2017  . Non-obstructive CAD    a. 07/2002 Cath: LM 20, LAD 10m/d, LCX 50-73m, OM1 71m, RCA 18m, EF 60%; b. 05/2014 MV: low risk w/ small sized, mild intensity rev defect in apical/inferior/infsept area, nl EF->Med Rx.  . Panic attack    . Paroxysmal atrial fibrillation ([place])    a. Dx 04/2014; b. 05/2014 Echo: Ef 60-65%, no rwma, triv MR/TR, nl RV;  c. CHA2DS2VASc = 3-->was on eliquis, switched to xarelto 04/2015 2/2 cost; d. 05/2015 Tikosyn loaded w/ conversion to AF; e. 06/2015 QTc prolongation and bradycardia->tikosyn d/c'd, PPM placed, Amio started; f. 07/01/2015 In Aflutter @ clinic f/u.  [name] Paroxysmal atrial flutter ([place])    a. 06/2015 noted to be in rapid Aflutter in Afib clinic-->amio load continued.  . Persistent atrial fibrillation 06/21/2015  . Presence of permanent cardiac pacemaker    St. Jude  . Prolapsed internal hemorrhoids, grade 2-3 10/19/2016   LL Gr 3 and RP/RA Gr 2 LL banded 10/19/2016   . RLL pneumonia ([place]) 11/05/2012  . Sleep apnea    "can't tolerate mask" (05/17/2017)  . Tachy-brady syndrome ([place])    a. 06/27/2015 s/p SJM DC PPM (ser # @ [id number]).    Family History  Problem Relation Age of Onset  . Dementia Mother   . Stroke Father   . Diabetes Paternal Grandmother   . Stroke Paternal Aunt        x 2  . Cancer Paternal Aunt        type unknown  . Colon cancer Neg Hx   . Stomach cancer Neg Hx     Past Surgical History:  Procedure Laterality Date  . ATRIAL FIBRILLATION ABLATION N/A 05/17/2017   Procedure: ATRIAL FIBRILLATION ABLATION;  Surgeon: Constance Haw, MD;  Location: [facility];  Service: Cardiovascular;  Laterality: N/A;  . BACK SURGERY    . CARDIAC CATHETERIZATION  ~ 2000  . CLOSED REDUCTION HAND FRACTURE Right 1984  . COLONOSCOPY  06/27/2015   per Dr. Henrene Pastor, internal hemorrhoids and diverticulae, repeat 10 yrs  . CYSTOSCOPY WITH RETROGRADE PYELOGRAM, URETEROSCOPY AND STENT PLACEMENT Bilateral 02/14/2018   Procedure: CYSTOSCOPY WITH RETROGRADE PYELOGRAM, URETEROSCOPY AND STENT PLACEMENT;  Surgeon: Alexis Frock, MD;  [facility];  Service: Urology;  Laterality: Bilateral;  . EP IMPLANTABLE DEVICE N/A 06/27/2015   Procedure: Pacemaker Implant;  Surgeon: Evans Lance, MD;   Location: [facility];  Service: Cardiovascular;  Laterality: N/A;  . FRACTURE SURGERY    . HEMORRHOID BANDING    . HOLMIUM LASER APPLICATION Bilateral 12/75/1700   Procedure: HOLMIUM LASER APPLICATION;  Surgeon: Alexis Frock, MD;  [facility];  Service: Urology;  Laterality: Bilateral;  . INSERT / REPLACE / REMOVE PACEMAKER    . LACERATION REPAIR Right 1984   "hand"  . POSTERIOR LUMBAR FUSION  1998   2 lumbar discs, Dr. Ellene Route; "ray cages"   Social History   Occupational History  . Occupation: retired  Tobacco Use  . Smoking status: Former Smoker    Packs/day: 2.00    Years: 28.00    Pack years: 56.00    Types: Cigarettes    Quit date: 02/27/1995    Years since quitting: 23.5  . Smokeless tobacco: Never Used  Substance and Sexual Activity  . Alcohol use: Not Currently    Alcohol/week: 0.0 standard drinks    Comment: couple times a month  . Drug use: No  . Sexual activity: Not Currently    Partners: Female

## 2018-09-18 NOTE — Telephone Encounter (Signed)
Please advise.  Rx prescribed on 08/18/18 for cellulitis  Should patient continue this?

## 2018-09-19 ENCOUNTER — Telehealth: Payer: Self-pay

## 2018-09-19 NOTE — Telephone Encounter (Signed)
Phone call to patient to verify medication list and allergies for myelogram procedure. Pt instructed he will need to hold Xarelto prior to Myelogram procedure (awaing approval and further instructions from Dr, Curt Bears). Pt verbalized understanding.

## 2018-09-22 ENCOUNTER — Telehealth: Payer: Self-pay | Admitting: *Deleted

## 2018-09-22 ENCOUNTER — Ambulatory Visit (INDEPENDENT_AMBULATORY_CARE_PROVIDER_SITE_OTHER): Payer: Medicare Other | Admitting: *Deleted

## 2018-09-22 DIAGNOSIS — I4819 Other persistent atrial fibrillation: Secondary | ICD-10-CM

## 2018-09-22 DIAGNOSIS — R9431 Abnormal electrocardiogram [ECG] [EKG]: Secondary | ICD-10-CM

## 2018-09-22 NOTE — Telephone Encounter (Signed)
   [facility] Pre-operative Risk Assessment    Request for surgical clearance:  1. What type of surgery is being performed? MEYLOGRAM   2. When is this surgery scheduled? TBD  3. What type of clearance is required (medical clearance vs. Pharmacy clearance to hold med vs. Both)? BOTH  4. Are there any medications that need to be held prior to surgery and how long? XARELTO X 1 DAY  5. Practice name and name of physician performing surgery? [facility]  6. What is your office phone number [phone]   7.   What is your office fax number [phone]  8.   Anesthesia type (None, local, MAC, general) ? LOCAL   Julaine Hua 09/22/2018, 4:22 PM  _________________________________________________________________   (provider comments below)

## 2018-09-22 NOTE — Telephone Encounter (Signed)
   Primary Cardiologist: Quay Burow, MD / [place]  Chart reviewed as part of pre-operative protocol coverage. Patient was contacted 09/22/2018 in reference to pre-operative risk assessment for pending surgery as outlined below.  Erik [name] was last seen on 06/16/18 via telemed by Dr. Curt Bears. H/o atrial fib, nonobstructive CAD, HTN, HLD, hypothyroidism, dual chamber PPM, AF ablation, mild carotid disease, CKD III, hypertensive heart disease, kidney stones. 2D echo 2017 normal EF. RCRI 0.4% indicating low risk of CV complications. Will route to pharm for input on Xarelto then pt will need call.  Charlie Pitter, PA-C 09/22/2018, 5:08 PM

## 2018-09-23 ENCOUNTER — Telehealth: Payer: Self-pay

## 2018-09-23 ENCOUNTER — Telehealth: Payer: Self-pay | Admitting: Orthopaedic Surgery

## 2018-09-23 ENCOUNTER — Other Ambulatory Visit: Payer: Self-pay | Admitting: Physician Assistant

## 2018-09-23 LAB — CUP PACEART REMOTE DEVICE CHECK
Battery Remaining Longevity: 115 mo
Battery Remaining Percentage: 95.5 %
Battery Voltage: 2.99 V
Brady Statistic AP VP Percent: 1 %
Brady Statistic AP VS Percent: 82 %
Brady Statistic AS VP Percent: 1 %
Brady Statistic AS VS Percent: 17 %
Brady Statistic RA Percent Paced: 82 %
Brady Statistic RV Percent Paced: 1 %
Date Time Interrogation Session: 20200728072944
Implantable Lead Implant Date: 20170501
Implantable Lead Implant Date: 20170501
Implantable Lead Location: 753859
Implantable Lead Location: 753860
Implantable Pulse Generator Implant Date: 20170501
Lead Channel Impedance Value: 460 Ohm
Lead Channel Impedance Value: 550 Ohm
Lead Channel Pacing Threshold Amplitude: 0.75 V
Lead Channel Pacing Threshold Amplitude: 1 V
Lead Channel Pacing Threshold Pulse Width: 0.5 ms
Lead Channel Pacing Threshold Pulse Width: 0.5 ms
Lead Channel Sensing Intrinsic Amplitude: 12 mV
Lead Channel Sensing Intrinsic Amplitude: 3.8 mV
Lead Channel Setting Pacing Amplitude: 2 V
Lead Channel Setting Pacing Amplitude: 2.5 V
Lead Channel Setting Pacing Pulse Width: 0.5 ms
Lead Channel Setting Sensing Sensitivity: 2 mV
Pulse Gen Model: 2272
Pulse Gen Serial Number: 7897836

## 2018-09-23 MED ORDER — TRAMADOL HCL 50 MG PO TABS: 50.0000 mg | ORAL_TABLET | Freq: Three times a day (TID) | ORAL | 2 refills | Status: DC | PRN

## 2018-09-23 NOTE — Telephone Encounter (Signed)
I advised patient.

## 2018-09-23 NOTE — Telephone Encounter (Signed)
Patient with diagnosis of afib on Xarelto for anticoagulation.    Procedure: MEYLOGRAM  Date of procedure: TBD  CHADS2-VASc score of  3 (HTN, AGE, CAD)  CrCl 68ml/min (actual BW crcl)  Per office protocol, patient can hold Xarelto for 3 days prior to procedure.

## 2018-09-23 NOTE — Telephone Encounter (Signed)
Reached out to patient but got VM. LMOM to call preop team back.

## 2018-09-23 NOTE — Telephone Encounter (Signed)
Please advise. Thanks.  

## 2018-09-23 NOTE — Telephone Encounter (Signed)
I just sent in tramadol to [facility] [address]

## 2018-09-23 NOTE — Telephone Encounter (Signed)
   [facility] Pre-operative Risk Assessment    Request for surgical clearance:  1. What type of surgery is being performed? Myelogram Procedure   2. When is this surgery scheduled? TBD   3. What type of clearance is required (medical clearance vs. Pharmacy clearance to hold med vs. Both)? Pharmacy  4. Are there any medications that need to be held prior to surgery and how long? Xarelto for 1 day  5. Practice name and name of physician performing surgery? [facility]   6. What is your office phone number [phone]    7.   What is your office fax number [phone]  8.   Anesthesia type (None, local, MAC, general) ? Local   Mady Haagensen 09/23/2018, 3:33 PM  _________________________________________________________________   (provider comments below)

## 2018-09-23 NOTE — Telephone Encounter (Signed)
Patient called asked if he can get  something for pain. The number to contact patient is [phone]

## 2018-09-24 NOTE — Telephone Encounter (Signed)
   Primary Cardiologist: Quay Burow, MD  Chart reviewed as part of pre-operative protocol coverage. I tried patient again today and was able to reach him. He is doing well from a cardiac standpoint and denies any cardiac symptoms. Since last OV he stopped eating junk food and has lost 20lb intentionally and is feeling well. Therefore based on ACC/AHA guidelines, Erik [name] would be at acceptable risk for the planned procedure without further cardiovascular testing.   Regarding holding Xarelto, our team recommends holding longer than requested below due to nature of procedure. Office pharmacist reviewed case and recommends that per office protocol, patient can hold Xarelto for 3 days prior to procedure.    I will route this recommendation to the requesting party via Epic fax function and remove from pre-op pool.  Please call with questions.  Charlie Pitter, PA-C 09/24/2018, 3:07 PM

## 2018-09-24 NOTE — Telephone Encounter (Signed)
Duplicate clearance, see note from 09/22/18. Awaiting callback from patient. Will close this encounter. Dayna Dunn PA-C

## 2018-10-03 ENCOUNTER — Telehealth: Payer: Self-pay | Admitting: Cardiology

## 2018-10-03 NOTE — Telephone Encounter (Signed)
No message needed °

## 2018-10-03 NOTE — Telephone Encounter (Signed)
New Message   This is not a duplicate. This is separate from the Myelogram on Back on from 09/22/18 and 09/23/18.       [facility] Pre-operative Risk Assessment    Request for surgical clearance:  1. What type of surgery is being performed? CT Optogram of the Right Hip   2. When is this surgery scheduled? TBD  3. What type of clearance is required (medical clearance vs. Pharmacy clearance to hold med vs. Both)? Pharmacy Hold Med Xarelto   4. Are there any medications that need to be held prior to surgery and how long? Xarelto held for 1 day prior  5. Practice name and name of physician performing surgery? [facility]  6. What is your office phone number [phone]   7.   What is your office fax number [phone]  8.   Anesthesia type (None, local, MAC, general) ? None   Jenkins Rouge 10/03/2018, 3:34 PM  _________________________________________________________________   (provider comments below)

## 2018-10-06 NOTE — Telephone Encounter (Signed)
Please comment on xarelto - new clearance request.

## 2018-10-06 NOTE — Telephone Encounter (Signed)
Pt takes Xarelto for afib with CHADS2VASc score of 3 (age, HTN, CAD). SCr 1.41, CrCl 8mL/min using actual body weight. Ok to hold Xarelto for 1 day prior to procedure as requested.

## 2018-10-06 NOTE — Telephone Encounter (Signed)
Pt informed that he is ok to hold xarelto 1 day prior to procedure. Pt voiced understanding.

## 2018-10-06 NOTE — Telephone Encounter (Signed)
   Primary Cardiologist: Quay Burow, MD  Chart reviewed as part of pre-operative protocol coverage.   Per our clinical pharmacist: Pt takes Xarelto for afib with CHADS2VASc score of 3 (age, HTN, CAD). SCr 1.41, CrCl 61mL/min using actual body weight. Ok to hold Xarelto for 1 day prior to procedure as requested    I will route this recommendation to the requesting party via [facility] fax function and remove from pre-op pool.  Please call with questions.  Tami Lin Brysyn Brandenberger, PA 10/06/2018, 3:37 PM

## 2018-10-08 ENCOUNTER — Ambulatory Visit
Admission: RE | Admit: 2018-10-08 | Discharge: 2018-10-08 | Disposition: A | Discharge status: Home / Self Care | Payer: Medicare Other | Source: Ambulatory Visit | Attending: Orthopaedic Surgery | Admitting: Orthopaedic Surgery

## 2018-10-08 DIAGNOSIS — M4807 Spinal stenosis, lumbosacral region: Secondary | ICD-10-CM

## 2018-10-08 DIAGNOSIS — M5126 Other intervertebral disc displacement, lumbar region: Secondary | ICD-10-CM | POA: Diagnosis not present

## 2018-10-08 DIAGNOSIS — M5416 Radiculopathy, lumbar region: Secondary | ICD-10-CM

## 2018-10-08 DIAGNOSIS — M4316 Spondylolisthesis, lumbar region: Secondary | ICD-10-CM | POA: Diagnosis not present

## 2018-10-08 DIAGNOSIS — M48061 Spinal stenosis, lumbar region without neurogenic claudication: Secondary | ICD-10-CM | POA: Diagnosis not present

## 2018-10-08 MED ORDER — DIAZEPAM 5 MG PO TABS
5.0000 mg | ORAL_TABLET | Freq: Once | ORAL | Status: AC
  Administered 2018-10-08: 5 mg via ORAL

## 2018-10-08 MED ORDER — IOPAMIDOL (ISOVUE-M 200) INJECTION 41%
15.0000 mL | Freq: Once | INTRAMUSCULAR | Status: AC
  Administered 2018-10-08: 12:00:00 15 mL via INTRATHECAL

## 2018-10-08 NOTE — Progress Notes (Addendum)
Patient states he has been off Xarelto since Monday night.  Also, it appears he was prescribed Tramadol on 7/28 after we screened his medications on 7/24.  He states his last dose of Tramadol was Monday, so we are okay to proceed with today's myelogram.  He has been informed to please hold this medication until tomorrow morning.

## 2018-10-08 NOTE — Progress Notes (Signed)
Remote pacemaker transmission.   

## 2018-10-08 NOTE — Discharge Instructions (Signed)
Myelogram Discharge Instructions  1. Go home and rest quietly for the next 24 hours.  It is important to lie flat for the next 24 hours.  Get up only to go to the restroom.  You may lie in the bed or on a couch on your back, your stomach, your left side or your right side.  You may have one pillow under your head.  You may have pillows between your knees while you are on your side or under your knees while you are on your back.  2. DO NOT drive today.  Recline the seat as far back as it will go, while still wearing your seat belt, on the way home.  3. You may get up to go to the bathroom as needed.  You may sit up for 10 minutes to eat.  You may resume your normal diet and medications unless otherwise indicated.  Drink lots of extra fluids today and tomorrow.  4. The incidence of headache, nausea, or vomiting is about 5% (one in 20 patients).  If you develop a headache, lie flat and drink plenty of fluids until the headache goes away.  Caffeinated beverages may be helpful.  If you develop severe nausea and vomiting or a headache that does not go away with flat bed rest, call [phone].  5. You may resume normal activities after your 24 hours of bed rest is over; however, do not exert yourself strongly or do any heavy lifting tomorrow. If when you get up you have a headache when standing, go back to bed and force fluids for another 24 hours.  6. Call your physician for a follow-up appointment.  The results of your myelogram will be sent directly to your physician by the following day.  7. If you have any questions or if complications develop after you arrive home, please call [phone].  Discharge instructions have been explained to the patient.  The patient, or the person responsible for the patient, fully understands these instructions.  YOU MAY RESTART YOUR XARELTO TODAY.  YOU MAY RESTART YOUR TRAMADOL TOMORROW, 10/09/2018, AFTER 1030a.m.

## 2018-10-10 ENCOUNTER — Ambulatory Visit: Payer: Medicare Other | Admitting: Orthopaedic Surgery

## 2018-10-14 ENCOUNTER — Ambulatory Visit: Payer: Medicare Other | Admitting: Orthopaedic Surgery

## 2018-10-21 ENCOUNTER — Ambulatory Visit
Admission: RE | Admit: 2018-10-21 | Discharge: 2018-10-21 | Disposition: A | Discharge status: Home / Self Care | Payer: Medicare Other | Source: Ambulatory Visit | Attending: Orthopaedic Surgery | Admitting: Orthopaedic Surgery

## 2018-10-21 ENCOUNTER — Other Ambulatory Visit: Payer: Self-pay

## 2018-10-21 DIAGNOSIS — M25551 Pain in right hip: Secondary | ICD-10-CM

## 2018-10-22 ENCOUNTER — Ambulatory Visit
Admission: RE | Admit: 2018-10-22 | Discharge: 2018-10-22 | Disposition: A | Discharge status: Home / Self Care | Payer: Medicare Other | Source: Ambulatory Visit | Attending: Orthopaedic Surgery | Admitting: Orthopaedic Surgery

## 2018-10-22 ENCOUNTER — Other Ambulatory Visit: Payer: Self-pay

## 2018-10-22 DIAGNOSIS — M25551 Pain in right hip: Secondary | ICD-10-CM | POA: Diagnosis not present

## 2018-10-22 MED ORDER — IOPAMIDOL (ISOVUE-M 200) INJECTION 41%
13.0000 mL | Freq: Once | INTRAMUSCULAR | Status: AC
  Administered 2018-10-22: 13 mL via INTRA_ARTICULAR

## 2018-10-24 ENCOUNTER — Ambulatory Visit (INDEPENDENT_AMBULATORY_CARE_PROVIDER_SITE_OTHER): Payer: Medicare Other | Admitting: Orthopaedic Surgery

## 2018-10-24 DIAGNOSIS — M1611 Unilateral primary osteoarthritis, right hip: Secondary | ICD-10-CM | POA: Diagnosis not present

## 2018-10-24 DIAGNOSIS — I4819 Other persistent atrial fibrillation: Secondary | ICD-10-CM

## 2018-10-24 DIAGNOSIS — I119 Hypertensive heart disease without heart failure: Secondary | ICD-10-CM | POA: Diagnosis not present

## 2018-10-24 NOTE — Progress Notes (Signed)
Office Visit Note   Patient: Erik [name]           Date of Birth: August 14, 1945           MRN: [id number] Visit Date: 10/24/2018              Requested by: [name], MD [place],  [place] [id number] PCP: [name], MD   Assessment & Plan: Visit Diagnoses:  1. Primary osteoarthritis of right hip   2. Hypertensive heart disease without heart failure   3. Persistent atrial fibrillation     Plan: I reviewed his CT studies and the right hip appears to be bone-on-bone DJD.  I also reviewed the CT lumbar spine but I do not feel that this can explain his symptoms.  These findings were discussed with the patient in detail.  He had temporary relief from the previous intra-articular cortisone injection so we will not repeated today.  At this point patient has had a chronic severe right hip pain without any relief from conservative treatments therefore he would like to proceed with scheduling for right total hip replacement.  Risks, possible complications, rehab and recovery were reviewed today.  We will obtain preoperative medical and cardiac clearance prior to scheduling.  He does take Xarelto for chronic atrial fibrillation for which she will have to stop 3 days in prior to surgery and for spinal anesthesia.  Questions encouraged and answered.  Follow-Up Instructions: Return if symptoms worsen or fail to improve.   Orders:  No orders of the defined types were placed in this encounter.  No orders of the defined types were placed in this encounter.     Procedures: No procedures performed   Clinical Data: No additional findings.   Subjective: Chief Complaint  Patient presents with  . Right Hip - Follow-up    [name] returns today for review of his recent CT studies.  He states that he has predominantly right groin pain ever since he felt a tearing sensation in the groin earlier this year.  He still has some mild chronic back pain and lateral hip pain as  well.   Review of Systems  Constitutional: Negative.   All other systems reviewed and are negative.    Objective: Vital Signs: There were no vitals taken for this visit.  Physical Exam Vitals signs and nursing note reviewed.  Constitutional:      Appearance: He is well-developed.  Pulmonary:     Effort: Pulmonary effort is normal.  Abdominal:     Palpations: Abdomen is soft.  Skin:    General: Skin is warm.  Neurological:     Mental Status: He is alert and oriented to person, place, and time.  Psychiatric:        Behavior: Behavior normal.        Thought Content: Thought content normal.        Judgment: Judgment normal.     Ortho Exam Right hip exam shows severe pain with attempted logroll and internal and external rotation.  Negative straight leg raise.  Lateral hip is only mildly tender. Specialty Comments:  No specialty comments available.  Imaging: No results found.   PMFS History: Patient Active Problem List   Diagnosis Date Noted  . Primary osteoarthritis of right hip 10/24/2018  . Neuropathy 07/30/2017  . Prolapsed internal hemorrhoids, grade 2-3 10/19/2016  . Constipation 09/29/2015  . CKD (chronic kidney disease), stage III ([place])   . Dizziness 06/26/2015  .  Long QT interval 06/26/2015  . Visit for monitoring Tikosyn therapy 06/26/2015  . Atypical chest pain 06/26/2015  . Persistent atrial fibrillation 06/21/2015  . Hypertensive heart disease   . Hypothyroidism   . Non-obstructive CAD   . Carotid disease, bilateral ([place])   . OBESITY 09/20/2008  . Hyperlipidemia 12/17/2005  . Essential hypertension 12/17/2005   Past Medical History:  Diagnosis Date  . Anxiety   . Arthritis   . Atypical chest pain 06/26/2015  . BPH with urinary obstruction   . Carotid disease, bilateral ([place])    a. 09/2014 Carotid U/S: 1-39% bilat ICA stenosis.  . Chronic lower back pain   . CKD (chronic kidney disease), stage III ([place])    pt. denies  . Diverticulitis  12/2016  . Diverticulosis   . Dyspnea   . Gout    "couple days/year" (05/17/2017)  . History of kidney stones   . Hyperlipidemia   . Hypertension   . Hypertensive heart disease   . Hypothyroidism   . Internal hemorrhoids   . Long QT interval 06/26/2015  . Nephrolithiasis   . Neuropathy 07/30/2017  . Non-obstructive CAD    a. 07/2002 Cath: LM 20, LAD 31m/d, LCX 50-45m, OM1 5m, RCA 14m, EF 60%; b. 05/2014 MV: low risk w/ small sized, mild intensity rev defect in apical/inferior/infsept area, nl EF->Med Rx.  . Panic attack   . Paroxysmal atrial fibrillation ([place])    a. Dx 04/2014; b. 05/2014 Echo: Ef 60-65%, no rwma, triv MR/TR, nl RV;  c. CHA2DS2VASc = 3-->was on eliquis, switched to xarelto 04/2015 2/2 cost; d. 05/2015 Tikosyn loaded w/ conversion to AF; e. 06/2015 QTc prolongation and bradycardia->tikosyn d/c'd, PPM placed, Amio started; f. 07/01/2015 In Aflutter @ clinic f/u.  [name] Paroxysmal atrial flutter ([place])    a. 06/2015 noted to be in rapid Aflutter in Afib clinic-->amio load continued.  . Persistent atrial fibrillation 06/21/2015  . Presence of permanent cardiac pacemaker    St. Jude  . Prolapsed internal hemorrhoids, grade 2-3 10/19/2016   LL Gr 3 and RP/RA Gr 2 LL banded 10/19/2016   . RLL pneumonia ([place]) 11/05/2012  . Sleep apnea    "can't tolerate mask" (05/17/2017)  . Tachy-brady syndrome ([place])    a. 06/27/2015 s/p SJM DC PPM (ser # @ [id number]).    Family History  Problem Relation Age of Onset  . Dementia Mother   . Stroke Father   . Diabetes Paternal Grandmother   . Stroke Paternal Aunt        x 2  . Cancer Paternal Aunt        type unknown  . Colon cancer Neg Hx   . Stomach cancer Neg Hx     Past Surgical History:  Procedure Laterality Date  . ATRIAL FIBRILLATION ABLATION N/A 05/17/2017   Procedure: ATRIAL FIBRILLATION ABLATION;  Surgeon: Constance Haw, MD;  Location: [facility];  Service: Cardiovascular;  Laterality: N/A;  . BACK SURGERY    . CARDIAC  CATHETERIZATION  ~ 2000  . CLOSED REDUCTION HAND FRACTURE Right 1984  . COLONOSCOPY  06/27/2015   per Dr. Henrene Pastor, internal hemorrhoids and diverticulae, repeat 10 yrs  . CYSTOSCOPY WITH RETROGRADE PYELOGRAM, URETEROSCOPY AND STENT PLACEMENT Bilateral 02/14/2018   Procedure: CYSTOSCOPY WITH RETROGRADE PYELOGRAM, URETEROSCOPY AND STENT PLACEMENT;  Surgeon: Alexis Frock, MD;  Location: [facility];  Service: Urology;  Laterality: Bilateral;  . EP IMPLANTABLE DEVICE N/A 06/27/2015   Procedure: Pacemaker Implant;  Surgeon: Evans Lance, MD;  Location: [facility];  Service: Cardiovascular;  Laterality: N/A;  . FRACTURE SURGERY    . HEMORRHOID BANDING    . HOLMIUM LASER APPLICATION Bilateral 49/75/3005   Procedure: HOLMIUM LASER APPLICATION;  Surgeon: Alexis Frock, MD;  Location: [facility];  Service: Urology;  Laterality: Bilateral;  . INSERT / REPLACE / REMOVE PACEMAKER    . LACERATION REPAIR Right 1984   "hand"  . POSTERIOR LUMBAR FUSION  1998   2 lumbar discs, Dr. Ellene Route; "ray cages"   Social History   Occupational History  . Occupation: retired  Tobacco Use  . Smoking status: Former Smoker    Packs/day: 2.00    Years: 28.00    Pack years: 56.00    Types: Cigarettes    Quit date: 02/27/1995    Years since quitting: 23.6  . Smokeless tobacco: Never Used  Substance and Sexual Activity  . Alcohol use: Not Currently    Alcohol/week: 0.0 standard drinks    Comment: couple times a month  . Drug use: No  . Sexual activity: Not Currently    Partners: Female

## 2018-11-13 ENCOUNTER — Telehealth: Payer: Self-pay | Admitting: *Deleted

## 2018-11-13 NOTE — Telephone Encounter (Addendum)
Patient with diagnosis of Afib on Xarelto for anticoagulation.    Procedure: RIGHT TOTAL HIP ARTHROPLASTY   Date of procedure: TBD  CHADS2-VASc score of  3 (HTN, AGE, CAD)   CrCl 62 ml/min  Per office protocol, patient can hold Xarelto for 3 days prior to procedure.

## 2018-11-13 NOTE — Telephone Encounter (Signed)
Anesthesia type: Spinal.

## 2018-11-13 NOTE — Telephone Encounter (Signed)
   Primary Cardiologist: Quay Burow, MD  Chart reviewed as part of pre-operative protocol coverage. Patient was contacted 11/13/2018 in reference to pre-operative risk assessment for pending surgery as outlined below.  Erik [name] was last seen on 06/16/18 by Dr. Curt Bears.  Since that day, Erik [name] has done well. Erik [name] has a history of atrial fibrillation, nonobstructive CAD, dual-chamber pacemaker, A. fib ablation, mild carotid disease, CKD stage III and hypertensive heart disease.  2D echo in 2017 showed normal EF.  According to the RCRI the patient has a 0.4% risk of major cardiac event perioperatively, which is low risk.  The patient is able to complete 4 METS of activity.  Able to walk up at least 2 flights of stairs and walk a mile without exertional chest discomfort or shortness of breath, although he is limited by hip pain.  Therefore, based on ACC/AHA guidelines, the patient would be at acceptable risk for the planned procedure without further cardiovascular testing.   According to our pharmacy protocol: Patient with diagnosis of Afib on Xarelto for anticoagulation.   Procedure: RIGHT TOTAL HIP ARTHROPLASTY Date of procedure: TBD  CHADS2-VASc score of  3 (HTN, AGE, CAD)  CrCl 62 ml/min  Per office protocol, patient can hold Xarelto for 3 days prior to procedure.     I will route this recommendation to the requesting party via Epic fax function and remove from pre-op pool.  Please call with questions.  Daune Perch, NP 11/13/2018, 4:53 PM

## 2018-11-13 NOTE — Telephone Encounter (Signed)
   [facility] Pre-operative Risk Assessment    Request for surgical clearance:  1. What type of surgery is being performed? RIGHT TOTAL HIP ARTHROPLASTY    2. When is this surgery scheduled? TBD   3. What type of clearance is required (medical clearance vs. Pharmacy clearance to hold med vs. Both)? BOTH  4. Are there any medications that need to be held prior to surgery and how long? [place]   5. Practice name and name of physician performing surgery? [facility]; [place]   6. What is your office phone number [phone]    7.   What is your office fax number [phone]  8.   Anesthesia type (None, local, MAC, general) ? NOT LISTED; LEFT MESSAGE FOR SURGEON'S OFFICE TO CALL WITH ANESTHESIA TYPE.    Julaine Hua 11/13/2018, 11:18 AM  _________________________________________________________________   (provider comments below)

## 2018-12-08 DIAGNOSIS — Z23 Encounter for immunization: Secondary | ICD-10-CM | POA: Diagnosis not present

## 2018-12-10 ENCOUNTER — Other Ambulatory Visit: Payer: Self-pay | Admitting: Physician Assistant

## 2018-12-12 ENCOUNTER — Telehealth: Payer: Self-pay | Admitting: Orthopaedic Surgery

## 2018-12-12 MED ORDER — TRAMADOL HCL 50 MG PO TABS: 50.0000 mg | ORAL_TABLET | Freq: Three times a day (TID) | ORAL | 2 refills | Status: DC | PRN

## 2018-12-12 NOTE — Telephone Encounter (Signed)
Please advise 

## 2018-12-12 NOTE — Addendum Note (Signed)
Addended by: Azucena Cecil on: 12/12/2018 06:40 PM   Modules accepted: Orders

## 2018-12-12 NOTE — Telephone Encounter (Signed)
Patient's wife called. Says he is in a lot of pain. Was wanting his Tramadol called in. His call back number is [phone]

## 2018-12-15 ENCOUNTER — Other Ambulatory Visit: Payer: Self-pay

## 2018-12-15 ENCOUNTER — Encounter: Payer: Self-pay | Admitting: Cardiology

## 2018-12-15 ENCOUNTER — Ambulatory Visit (INDEPENDENT_AMBULATORY_CARE_PROVIDER_SITE_OTHER): Payer: Medicare Other | Admitting: Cardiology

## 2018-12-15 VITALS — BP 98/58 | HR 82 | Ht 69.5 in | Wt 250.6 lb

## 2018-12-15 DIAGNOSIS — R001 Bradycardia, unspecified: Secondary | ICD-10-CM | POA: Diagnosis not present

## 2018-12-15 DIAGNOSIS — I480 Paroxysmal atrial fibrillation: Secondary | ICD-10-CM | POA: Diagnosis not present

## 2018-12-15 DIAGNOSIS — Z950 Presence of cardiac pacemaker: Secondary | ICD-10-CM | POA: Diagnosis not present

## 2018-12-15 NOTE — Telephone Encounter (Signed)
Ok to refill. 50mg  1 tab tid prn pain #30

## 2018-12-15 NOTE — Progress Notes (Signed)
Electrophysiology Office Note   Date:  12/15/2018   ID:  Erik [name], DOB 1945-11-29, MRN [id number]  PCP:  [name], MD  Cardiologist:  [name] Primary Electrophysiologist:  Constance Haw, MD    No chief complaint on file.    History of Present Illness: Erik [name] is a 73 y.o. male who presents today for electrophysiology evaluation.   He presents today for workup of his atrial fibrillation.  He has a history of nonobstructive coronary artery disease, hypertension, hyperlipidemia, hypothyroidism. In March 2016, he was diagnosed with atrial fibrillation with RVR in the setting of chest pain. He was loaded on tikosyn which was stopped as he went back in atrial fibrillation on 500 mcg twice a day..  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  He had an atrial fibrillation ablation 05/17/2017.  Today, denies symptoms of palpitations, chest pain, shortness of breath, orthopnea, PND, lower extremity edema, claudication, dizziness, presyncope, syncope, bleeding, or neurologic sequela. The patient is tolerating medications without difficulties.     Past Medical History:  Diagnosis Date  . Anxiety   . Arthritis   . Atypical chest pain 06/26/2015  . BPH with urinary obstruction   . Carotid disease, bilateral ([place])    a. 09/2014 Carotid U/S: 1-39% bilat ICA stenosis.  . Chronic lower back pain   . CKD (chronic kidney disease), stage III    pt. denies  . Diverticulitis 12/2016  . Diverticulosis   . Dyspnea   . Gout    "couple days/year" (05/17/2017)  . History of kidney stones   . Hyperlipidemia   . Hypertension   . Hypertensive heart disease   . Hypothyroidism   . Internal hemorrhoids   . Long QT interval 06/26/2015  . Nephrolithiasis   . Neuropathy 07/30/2017  . Non-obstructive CAD    a. 07/2002 Cath: LM 20, LAD 34m/d, LCX 50-64m, OM1 76m, RCA 94m, EF 60%; b. 05/2014 MV: low risk w/ small sized, mild intensity rev defect in  apical/inferior/infsept area, nl EF->Med Rx.  . Panic attack   . Paroxysmal atrial fibrillation ([place])    a. Dx 04/2014; b. 05/2014 Echo: Ef 60-65%, no rwma, triv MR/TR, nl RV;  c. CHA2DS2VASc = 3-->was on eliquis, switched to xarelto 04/2015 2/2 cost; d. 05/2015 Tikosyn loaded w/ conversion to AF; e. 06/2015 QTc prolongation and bradycardia->tikosyn d/c'd, PPM placed, Amio started; f. 07/01/2015 In Aflutter @ clinic f/u.  [name] Paroxysmal atrial flutter ([place])    a. 06/2015 noted to be in rapid Aflutter in Afib clinic-->amio load continued.  . Persistent atrial fibrillation ([place]) 06/21/2015  . Presence of permanent cardiac pacemaker    St. Jude  . Prolapsed internal hemorrhoids, grade 2-3 10/19/2016   LL Gr 3 and RP/RA Gr 2 LL banded 10/19/2016   . RLL pneumonia 11/05/2012  . Sleep apnea    "can't tolerate mask" (05/17/2017)  . Tachy-brady syndrome ([place])    a. 06/27/2015 s/p SJM DC PPM (ser # @ [id number]).   Past Surgical History:  Procedure Laterality Date  . ATRIAL FIBRILLATION ABLATION N/A 05/17/2017   Procedure: ATRIAL FIBRILLATION ABLATION;  Surgeon: Constance Haw, MD;  Location: [facility];  Service: Cardiovascular;  Laterality: N/A;  . BACK SURGERY    . CARDIAC CATHETERIZATION  ~ 2000  . CLOSED REDUCTION HAND FRACTURE Right 1984  . COLONOSCOPY  06/27/2015   per Dr. Henrene Pastor, internal hemorrhoids and diverticulae, repeat 10 yrs  . CYSTOSCOPY WITH RETROGRADE  PYELOGRAM, URETEROSCOPY AND STENT PLACEMENT Bilateral 02/14/2018   Procedure: CYSTOSCOPY WITH RETROGRADE PYELOGRAM, URETEROSCOPY AND STENT PLACEMENT;  Surgeon: Alexis Frock, MD;  [facility];  Service: Urology;  Laterality: Bilateral;  . EP IMPLANTABLE DEVICE N/A 06/27/2015   Procedure: Pacemaker Implant;  Surgeon: Evans Lance, MD;  Location: [facility];  Service: Cardiovascular;  Laterality: N/A;  . FRACTURE SURGERY    . HEMORRHOID BANDING    . HOLMIUM LASER APPLICATION Bilateral 16/11/9602   Procedure: HOLMIUM LASER  APPLICATION;  Surgeon: Alexis Frock, MD;  [facility];  Service: Urology;  Laterality: Bilateral;  . INSERT / REPLACE / REMOVE PACEMAKER    . LACERATION REPAIR Right 1984   "hand"  . POSTERIOR LUMBAR FUSION  1998   2 lumbar discs, Dr. Ellene Route; "ray cages"     Current Outpatient Medications  Medication Sig Dispense Refill  . acetaminophen (TYLENOL) 500 MG tablet Take 1,000 mg by mouth every 6 (six) hours as needed.    . diltiazem (CARDIZEM CD) 300 MG 24 hr capsule Take 1 capsule (300 mg total) by mouth daily. 90 capsule 2  . diltiazem (CARDIZEM) 30 MG tablet Cardizem 30mg  -- take 1 tablet every 4 hours AS NEEDED for heart rate >100 as long as blood pressure >100. 90 tablet 1  . gabapentin (NEURONTIN) 600 MG tablet TAKE 1 TABLET BY MOUTH TWICE A DAY 180 tablet 1  . hydrochlorothiazide (HYDRODIURIL) 25 MG tablet Take 1 tablet (25 mg total) by mouth daily. 90 tablet 3  . ibuprofen (ADVIL,MOTRIN) 200 MG tablet Take 200 mg by mouth every 8 (eight) hours as needed for headache or moderate pain.     [name] ketorolac (TORADOL) 10 MG tablet Take 1 tablet (10 mg total) by mouth every 6 (six) hours as needed for moderate pain. Post-operatively (pt has tolerated prior) 20 tablet 0  . levothyroxine (SYNTHROID) 50 MCG tablet TAKE 1 TABLET BY MOUTH EVERY DAY 90 tablet 3  . losartan (COZAAR) 100 MG tablet Take 1 tablet (100 mg total) by mouth daily. 90 tablet 3  . oxyCODONE-acetaminophen (PERCOCET) 10-325 MG tablet Take 1 tablet by mouth every 4 (four) hours as needed for pain. Post-operatively (severe pain) 30 tablet 0  . predniSONE (DELTASONE) 10 MG tablet TAKE 1 TABLET BY MOUTH TWICE A DAY AS NEEDED FOR JOINT PAIN 60 tablet 2  . rivaroxaban (XARELTO) 20 MG TABS tablet Take 1 tablet (20 mg total) by mouth daily with supper. 180 tablet 0  . simvastatin (ZOCOR) 20 MG tablet TAKE 1 TABLET BY MOUTH EVERY DAY 90 tablet 3  . tamsulosin (FLOMAX) 0.4 MG CAPS capsule TAKE 1 CAPSULE BY MOUTH EVERY DAY 90 capsule 3   . traMADol (ULTRAM) 50 MG tablet Take 1 tablet (50 mg total) by mouth 3 (three) times daily as needed. 30 tablet 2   No current facility-administered medications for this visit.     Allergies:   Patient has no known allergies.   Social History:  The patient  reports that he quit smoking about 23 years ago. His smoking use included cigarettes. He has a 56.00 pack-year smoking history. He has never used smokeless tobacco. He reports previous alcohol use. He reports that he does not use drugs.   Family History:  The patient's family history includes Cancer in his paternal aunt; Dementia in his mother; Diabetes in his paternal grandmother; Stroke in his father and paternal aunt.   ROS:  Please see the history of present illness.   Otherwise,  review of systems is positive for none.   All other systems are reviewed and negative.   PHYSICAL EXAM: VS:  BP (!) 98/58   Pulse 82   Ht 5' 9.5" (1.765 m)   Wt 250 lb 9.6 oz (113.7 kg)   SpO2 98%   BMI 36.48 kg/m  , BMI Body mass index is 36.48 kg/m. GEN: Well nourished, well developed, in no acute distress  HEENT: normal  Neck: no JVD, carotid bruits, or masses Cardiac: RRR; no murmurs, rubs, or gallops,no edema  Respiratory:  clear to auscultation bilaterally, normal work of breathing GI: soft, nontender, nondistended, + BS MS: no deformity or atrophy  Skin: warm and dry, device site well healed Neuro:  Strength and sensation are intact Psych: euthymic mood, full affect  EKG:  EKG is ordered today. Personal review of the ekg ordered shows atrial paced  Personal review of the device interrogation today. Results in [facility]: 01/24/2018: ALT 30 02/12/2018: BUN 21; Creatinine, Ser 1.41; Hemoglobin 14.2; Platelets 213; Potassium 3.8; Sodium 142    Lipid Panel     Component Value Date/Time   CHOL 136 05/18/2017 0514   TRIG 85 05/18/2017 0514   TRIG 203 (HH) 12/10/2005 0839   HDL 37 (L) 05/18/2017 0514   CHOLHDL 3.7  05/18/2017 0514   VLDL 17 05/18/2017 0514   LDLCALC 82 05/18/2017 0514   LDLDIRECT 104.9 06/09/2009 0919     Wt Readings from Last 3 Encounters:  12/15/18 250 lb 9.6 oz (113.7 kg)  07/25/18 261 lb 3.2 oz (118.5 kg)  06/16/18 258 lb (117 kg)      Other studies Reviewed: Additional studies/ records that were reviewed today include: TTE 11/10/15 Review of the above records today demonstrates:  - Left ventricle: The cavity size was normal. Wall thickness was   increased in a pattern of mild LVH. The estimated ejection   fraction was 55%. Wall motion was normal; there were no regional   wall motion abnormalities. Doppler parameters are consistent with   abnormal left ventricular relaxation (grade 1 diastolic   dysfunction). - Aortic valve: There was no stenosis. - Mitral valve: There was trivial regurgitation. - Left atrium: The atrium was mildly dilated. - Right ventricle: The cavity size was normal. Pacer wire or   catheter noted in right ventricle. Systolic function was normal. - Pulmonary arteries: No complete TR doppler jet so unable to   estimate PA systolic pressure. - Inferior vena cava: The vessel was normal in size. The   respirophasic diameter changes were in the normal range (= 50%),   consistent with normal central venous pressure. - Pericardium, extracardiac: A trivial pericardial effusion was   identified posterior to the heart.  Myoview 06/04/14 Low risk stress nuclear study with a small sized, mild intensity reversible defect concering for mild apical-inferior/inferoseptal ischeima.  Clinical Correlation is recommended.  ASSESSMENT AND PLAN:  1.  Persistent atrial fibrillation: Status post ablation 05/17/2017.  Currently on Xarelto and diltiazem.  Minimal atrial arrhythmias since ablation with less than 1% on device interrogation.  No changes.    This patients CHA2DS2-VASc Score and unadjusted Ischemic Stroke Rate (% per year) is equal to 2.2 % stroke rate/year  from a score of 2  Above score calculated as 1 point each if present [CHF, HTN, DM, Vascular=MI/PAD/Aortic Plaque, Age if 65-74, or Male] Above score calculated as 2 points each if present [Age > 75, or Stroke/TIA/TE]   2. Hypertension: Blood pressure is  low today.  Continue losartan.  3.  Sinus node dysfunction: Status post Saint Jude dual-chamber pacemaker.  Device functioning appropriately.  No changes.  4. Shortness of breath: resolved  Current medicines are reviewed at length with the patient today.   The patient does not have concerns regarding his medicines.  The following changes were made today: none  Labs/ tests ordered today include:  Orders Placed This Encounter  Procedures  . EKG 12-Lead     Disposition:   FU with Marylynn Rigdon 12 months  Signed, Keisa Blow [name], MD  12/15/2018 4:21 PM     [address] [place] [address] [phone] (office) [phone]

## 2018-12-15 NOTE — Patient Instructions (Signed)
Medication Instructions:  Your physician recommends that you continue on your current medications as directed. Please refer to the Current Medication list given to you today.  *If you need a refill on your cardiac medications before your next appointment, please call your pharmacy*  Labwork: None ordered If you have labs (blood work) drawn today and your tests are completely normal, you will receive your results only by:  [place] (if you have MyChart) OR  A paper copy in the mail If you have any lab test that is abnormal or we need to change your treatment, we will call you to review the results.  Testing/Procedures: None ordered  Follow-Up: Remote monitoring is used to monitor your Pacemaker or ICD from home. This monitoring reduces the number of office visits required to check your device to one time per year. It allows [place] to keep an eye on the functioning of your device to ensure it is working properly. You are scheduled for a device check from home on 12/23/2018 & 03/24/2019. You may send your transmission at any time that day. If you have a wireless device, the transmission will be sent automatically. After your physician reviews your transmission, you will receive a postcard with your next transmission date.  At [facility], you and your health needs are our priority.  As part of our continuing mission to provide you with exceptional heart care, we have created designated Provider Care Teams.  These Care Teams include your primary Cardiologist (physician) and Advanced Practice Providers (APPs -  Physician Assistants and Nurse Practitioners) who all work together to provide you with the care you need, when you need it.  You will need a follow up appointment in 12 months.  Please call our office 2 months in advance to schedule this appointment.  You may see Dr Curt Bears or one of the following Advanced Practice Providers on your designated Care Team:    Chanetta Marshall, NP  Tommye Standard, PA-C  Oda Kilts, [place]  Thank you for choosing [facility]!!   Trinidad Curet, RN [phone]  Any Other Special Instructions Will Be Listed Below (If Applicable).

## 2018-12-22 ENCOUNTER — Other Ambulatory Visit: Payer: Self-pay | Admitting: Family Medicine

## 2018-12-22 MED ORDER — GABAPENTIN 600 MG PO TABS: 600.0000 mg | ORAL_TABLET | Freq: Two times a day (BID) | ORAL | 1 refills | Status: DC

## 2018-12-22 NOTE — Telephone Encounter (Signed)
Medication Refill - Medication: gabapentin (NEURONTIN) 600 MG tablet  Pt's wife states pt needs new rx sent to new pharmacy.   New Pharmacy:  [facility] #[id number] - [place], [place] - [address] AT [address] [phone] (Phone) [phone] (Fax)     Pt was advised that RX refills may take up to 3 business days. We ask that you follow-up with your pharmacy.

## 2018-12-23 ENCOUNTER — Ambulatory Visit (INDEPENDENT_AMBULATORY_CARE_PROVIDER_SITE_OTHER): Payer: Medicare Other | Admitting: *Deleted

## 2018-12-23 DIAGNOSIS — I4819 Other persistent atrial fibrillation: Secondary | ICD-10-CM

## 2018-12-23 LAB — CUP PACEART REMOTE DEVICE CHECK
Battery Remaining Longevity: 116 mo
Battery Remaining Percentage: 95.5 %
Battery Voltage: 2.99 V
Brady Statistic AP VP Percent: 1 %
Brady Statistic AP VS Percent: 79 %
Brady Statistic AS VP Percent: 0 %
Brady Statistic AS VS Percent: 21 %
Brady Statistic RA Percent Paced: 79 %
Brady Statistic RV Percent Paced: 1 %
Date Time Interrogation Session: 20201026083010
Implantable Lead Implant Date: 20170501
Implantable Lead Implant Date: 20170501
Implantable Lead Location: 753859
Implantable Lead Location: 753860
Implantable Pulse Generator Implant Date: 20170501
Lead Channel Impedance Value: 440 Ohm
Lead Channel Impedance Value: 540 Ohm
Lead Channel Pacing Threshold Amplitude: 1 V
Lead Channel Pacing Threshold Amplitude: 1 V
Lead Channel Pacing Threshold Pulse Width: 0.5 ms
Lead Channel Pacing Threshold Pulse Width: 0.5 ms
Lead Channel Sensing Intrinsic Amplitude: 12 mV
Lead Channel Sensing Intrinsic Amplitude: 3.7 mV
Lead Channel Setting Pacing Amplitude: 2 V
Lead Channel Setting Pacing Amplitude: 2.5 V
Lead Channel Setting Pacing Pulse Width: 0.5 ms
Lead Channel Setting Sensing Sensitivity: 2 mV
Pulse Gen Model: 2272
Pulse Gen Serial Number: 7897836

## 2019-01-03 ENCOUNTER — Other Ambulatory Visit: Payer: Self-pay | Admitting: Family Medicine

## 2019-01-06 ENCOUNTER — Other Ambulatory Visit: Payer: Self-pay | Admitting: Cardiology

## 2019-01-06 ENCOUNTER — Other Ambulatory Visit: Payer: Self-pay | Admitting: Family Medicine

## 2019-01-06 NOTE — Telephone Encounter (Signed)
Medication Refill - Medication: levothyroxine (SYNTHROID) 50 MCG tablet [[id number]]  hydrochlorothiazide (HYDRODIURIL) 25 MG tablet [[id number]]   Patient would like a 90 day supply of each    Preferred Pharmacy (with phone number or street name):  [id number] - [place], [place] - [address]  [address] [place] [address]  Phone: [phone] Fax: [phone]     Agent: Please be advised that RX refills may take up to 3 business days. We ask that you follow-up with your pharmacy.

## 2019-01-13 ENCOUNTER — Other Ambulatory Visit: Payer: Self-pay

## 2019-01-13 MED ORDER — LEVOTHYROXINE SODIUM 50 MCG PO TABS: 50.0000 ug | ORAL_TABLET | Freq: Every day | ORAL | 0 refills | Status: DC

## 2019-01-13 MED ORDER — HYDROCHLOROTHIAZIDE 25 MG PO TABS: 25.0000 mg | ORAL_TABLET | Freq: Every day | ORAL | 0 refills | Status: DC

## 2019-01-13 NOTE — Progress Notes (Signed)
Remote pacemaker transmission.   

## 2019-01-14 ENCOUNTER — Ambulatory Visit: Payer: Medicare Other | Admitting: Family Medicine

## 2019-01-14 ENCOUNTER — Encounter: Payer: Self-pay | Admitting: Family Medicine

## 2019-01-14 ENCOUNTER — Ambulatory Visit (INDEPENDENT_AMBULATORY_CARE_PROVIDER_SITE_OTHER): Payer: Medicare Other | Admitting: Family Medicine

## 2019-01-14 ENCOUNTER — Other Ambulatory Visit: Payer: Self-pay

## 2019-01-14 VITALS — BP 122/62 | HR 61 | Temp 98.5°F | Ht 69.75 in | Wt 247.0 lb

## 2019-01-14 DIAGNOSIS — I10 Essential (primary) hypertension: Secondary | ICD-10-CM | POA: Diagnosis not present

## 2019-01-14 DIAGNOSIS — N1832 Chronic kidney disease, stage 3b: Secondary | ICD-10-CM | POA: Diagnosis not present

## 2019-01-14 DIAGNOSIS — I4819 Other persistent atrial fibrillation: Secondary | ICD-10-CM | POA: Diagnosis not present

## 2019-01-14 DIAGNOSIS — M1611 Unilateral primary osteoarthritis, right hip: Secondary | ICD-10-CM

## 2019-01-14 DIAGNOSIS — E039 Hypothyroidism, unspecified: Secondary | ICD-10-CM

## 2019-01-14 NOTE — Progress Notes (Signed)
   Subjective:    Patient ID: Erik [name], male    DOB: 04-Nov-1945, 73 y.o.   MRN: [id number]  HPI Here to follow up on hypothyroidism. He feels well in general. He is scheduled for a right hip total replacement on 02-02-19.    Review of Systems  Constitutional: Negative.   Respiratory: Negative.   Cardiovascular: Negative.   Endocrine: Negative.        Objective:   Physical Exam Constitutional:      Appearance: Normal appearance.  Cardiovascular:     Rate and Rhythm: Normal rate and regular rhythm.     Pulses: Normal pulses.     Heart sounds: Normal heart sounds.  Pulmonary:     Effort: Pulmonary effort is normal.     Breath sounds: Normal breath sounds.  Neurological:     Mental Status: He is alert.           Assessment & Plan:  Hypothyroidism, we will check a thyroid panel today.  [name], MD

## 2019-01-15 LAB — BASIC METABOLIC PANEL
BUN: 20 mg/dL (ref 6–23)
CO2: 30 mEq/L (ref 19–32)
Calcium: 10 mg/dL (ref 8.4–10.5)
Chloride: 103 mEq/L (ref 96–112)
Creatinine, Ser: 1.39 mg/dL (ref 0.40–1.50)
GFR: 50.25 mL/min — ABNORMAL LOW (ref >60.00)
Glucose, Bld: 108 mg/dL — ABNORMAL HIGH (ref 70–99)
Potassium: 3.7 mEq/L (ref 3.5–5.1)
Sodium: 142 mEq/L (ref 135–145)

## 2019-01-15 LAB — CBC WITH DIFFERENTIAL/PLATELET
Basophils Absolute: 0.1 10*3/uL (ref 0.0–0.1)
Basophils Relative: 0.9 % (ref 0.0–3.0)
Eosinophils Absolute: 0 10*3/uL (ref 0.0–0.7)
Eosinophils Relative: 0.2 % (ref 0.0–5.0)
HCT: 42.8 % (ref 39.0–52.0)
Hemoglobin: 14.7 g/dL (ref 13.0–17.0)
Lymphocytes Relative: 10.2 % — ABNORMAL LOW (ref 12.0–46.0)
Lymphs Abs: 1 10*3/uL (ref 0.7–4.0)
MCHC: 34.3 g/dL (ref 30.0–36.0)
MCV: 87.7 fl (ref 78.0–100.0)
Monocytes Absolute: 0.6 10*3/uL (ref 0.1–1.0)
Monocytes Relative: 5.6 % (ref 3.0–12.0)
Neutro Abs: 8.3 10*3/uL — ABNORMAL HIGH (ref 1.4–7.7)
Neutrophils Relative %: 83.1 % — ABNORMAL HIGH (ref 43.0–77.0)
Platelets: 250 10*3/uL (ref 150.0–400.0)
RBC: 4.88 Mil/uL (ref 4.22–5.81)
RDW: 13.2 % (ref 11.5–15.5)
WBC: 9.9 10*3/uL (ref 4.0–10.5)

## 2019-01-15 LAB — TSH: TSH: 0.91 u[IU]/mL (ref 0.35–4.50)

## 2019-01-15 LAB — HEPATIC FUNCTION PANEL
ALT: 15 U/L (ref 0–53)
AST: 12 U/L (ref 0–37)
Albumin: 4.6 g/dL (ref 3.5–5.2)
Alkaline Phosphatase: 45 U/L (ref 39–117)
Bilirubin, Direct: 0.1 mg/dL (ref 0.0–0.3)
Total Bilirubin: 0.6 mg/dL (ref 0.2–1.2)
Total Protein: 7.1 g/dL (ref 6.0–8.3)

## 2019-01-15 LAB — T3, FREE: T3, Free: 3.7 pg/mL (ref 2.3–4.2)

## 2019-01-15 LAB — T4, FREE: Free T4: 0.98 ng/dL (ref 0.60–1.60)

## 2019-01-19 ENCOUNTER — Telehealth: Payer: Self-pay | Admitting: Orthopaedic Surgery

## 2019-01-19 NOTE — Telephone Encounter (Signed)
Patient called and left a message on the voicemail. He would like someone to call him. No reason given. Call back number is [phone]

## 2019-01-20 NOTE — Telephone Encounter (Signed)
ok 

## 2019-01-20 NOTE — Telephone Encounter (Signed)
FYI: Scheduled to have Hip SU 02/02/2019. Would like to post pone it for now. Feels so much better with medicine from PCP.

## 2019-01-21 ENCOUNTER — Other Ambulatory Visit: Payer: Self-pay

## 2019-01-21 NOTE — Telephone Encounter (Signed)
Canceled surgery.

## 2019-02-02 ENCOUNTER — Encounter (HOSPITAL_COMMUNITY): Admission: RE | Payer: Self-pay | Source: Home / Self Care

## 2019-02-02 ENCOUNTER — Ambulatory Visit (HOSPITAL_COMMUNITY): Admission: RE | Admit: 2019-02-02 | Payer: Medicare Other | Source: Home / Self Care | Admitting: Orthopaedic Surgery

## 2019-02-02 SURGERY — ARTHROPLASTY, HIP, TOTAL, ANTERIOR APPROACH
Anesthesia: Spinal | Site: Hip | Laterality: Right

## 2019-02-10 ENCOUNTER — Other Ambulatory Visit: Payer: Self-pay | Admitting: Family Medicine

## 2019-02-12 ENCOUNTER — Other Ambulatory Visit: Payer: Self-pay | Admitting: Cardiology

## 2019-02-16 LAB — CUP PACEART INCLINIC DEVICE CHECK
Battery Remaining Longevity: 123 mo
Battery Voltage: 2.99 V
Brady Statistic RA Percent Paced: 81 %
Brady Statistic RV Percent Paced: 0.59 %
Date Time Interrogation Session: 20201019143900
Implantable Lead Implant Date: 20170501
Implantable Lead Implant Date: 20170501
Implantable Lead Location: 753859
Implantable Lead Location: 753860
Implantable Pulse Generator Implant Date: 20170501
Lead Channel Impedance Value: 450 Ohm
Lead Channel Impedance Value: 550 Ohm
Lead Channel Pacing Threshold Amplitude: 1 V
Lead Channel Pacing Threshold Amplitude: 1 V
Lead Channel Pacing Threshold Pulse Width: 0.5 ms
Lead Channel Pacing Threshold Pulse Width: 0.5 ms
Lead Channel Sensing Intrinsic Amplitude: 12 mV
Lead Channel Sensing Intrinsic Amplitude: 3.4 mV
Lead Channel Setting Pacing Amplitude: 2 V
Lead Channel Setting Pacing Amplitude: 2.5 V
Lead Channel Setting Pacing Pulse Width: 0.5 ms
Lead Channel Setting Sensing Sensitivity: 2 mV
Pulse Gen Model: 2272
Pulse Gen Serial Number: 7897836

## 2019-02-24 ENCOUNTER — Other Ambulatory Visit: Payer: Self-pay | Admitting: Cardiology

## 2019-02-24 NOTE — Telephone Encounter (Signed)
Xarelto 20mg  refill request received. Pt is 73 years old, weight-112kg, Crea-1.39 on 01/14/2019, last seen by Dr. Curt Bears on 12/15/2018, Diagnosis-Afib, CrCl-77.71ml/min; Dose is appropriate based on dosing criteria. Will send in refill to requested pharmacy.

## 2019-03-12 ENCOUNTER — Other Ambulatory Visit: Payer: Self-pay | Admitting: Family Medicine

## 2019-03-19 ENCOUNTER — Telehealth: Payer: Self-pay | Admitting: Cardiology

## 2019-03-19 NOTE — Telephone Encounter (Signed)
We are recommending the COVID-19 vaccine to all of our patients. Cardiac medications (including blood thinners) should not deter anyone from being vaccinated and there is no need to hold any of those medications prior to vaccine administration.     Currently, there is a hotline to call (active 03/06/19) to schedule vaccination appointments as no walk-ins will be accepted.   Number: [phone].    If an appointment is not available please go to [email or web address] to sign up for notification when additional vaccine appointments are available.   If you have further questions or concerns about the vaccine process, please visit [email or web address] or contact your primary care physician.  I read the above information to pt;s wife. She acknowledged that she did understand it.

## 2019-03-24 ENCOUNTER — Ambulatory Visit (INDEPENDENT_AMBULATORY_CARE_PROVIDER_SITE_OTHER): Payer: Medicare Other | Admitting: *Deleted

## 2019-03-24 DIAGNOSIS — I4819 Other persistent atrial fibrillation: Secondary | ICD-10-CM | POA: Diagnosis not present

## 2019-03-24 LAB — CUP PACEART REMOTE DEVICE CHECK
Battery Remaining Longevity: 115 mo
Battery Remaining Percentage: 95.5 %
Battery Voltage: 2.99 V
Brady Statistic AP VP Percent: 1 %
Brady Statistic AP VS Percent: 81 %
Brady Statistic AS VP Percent: 1 %
Brady Statistic AS VS Percent: 18 %
Brady Statistic RA Percent Paced: 80 %
Brady Statistic RV Percent Paced: 1 %
Date Time Interrogation Session: 20210125020801
Implantable Lead Implant Date: 20170501
Implantable Lead Implant Date: 20170501
Implantable Lead Location: 753859
Implantable Lead Location: 753860
Implantable Pulse Generator Implant Date: 20170501
Lead Channel Impedance Value: 440 Ohm
Lead Channel Impedance Value: 530 Ohm
Lead Channel Pacing Threshold Amplitude: 0.875 V
Lead Channel Pacing Threshold Amplitude: 1 V
Lead Channel Pacing Threshold Pulse Width: 0.5 ms
Lead Channel Pacing Threshold Pulse Width: 0.5 ms
Lead Channel Sensing Intrinsic Amplitude: 5 mV
Lead Channel Sensing Intrinsic Amplitude: 9.3 mV
Lead Channel Setting Pacing Amplitude: 1.875
Lead Channel Setting Pacing Amplitude: 2.5 V
Lead Channel Setting Pacing Pulse Width: 0.5 ms
Lead Channel Setting Sensing Sensitivity: 2 mV
Pulse Gen Model: 2272
Pulse Gen Serial Number: 7897836

## 2019-04-03 ENCOUNTER — Other Ambulatory Visit: Payer: Self-pay | Admitting: Cardiology

## 2019-04-09 ENCOUNTER — Other Ambulatory Visit: Payer: Self-pay | Admitting: Family Medicine

## 2019-04-10 ENCOUNTER — Other Ambulatory Visit: Payer: Self-pay | Admitting: Family Medicine

## 2019-04-11 ENCOUNTER — Other Ambulatory Visit: Payer: Self-pay | Admitting: Family Medicine

## 2019-04-12 ENCOUNTER — Ambulatory Visit: Payer: Medicare Other

## 2019-04-27 ENCOUNTER — Other Ambulatory Visit: Payer: Self-pay | Admitting: Family Medicine

## 2019-04-27 NOTE — Telephone Encounter (Signed)
Please advise. Should the patient continue this?

## 2019-05-06 ENCOUNTER — Encounter: Payer: Self-pay | Admitting: Family Medicine

## 2019-05-06 ENCOUNTER — Ambulatory Visit (INDEPENDENT_AMBULATORY_CARE_PROVIDER_SITE_OTHER): Payer: Medicare Other | Admitting: Family Medicine

## 2019-05-06 ENCOUNTER — Other Ambulatory Visit: Payer: Self-pay

## 2019-05-06 VITALS — BP 120/60 | HR 74 | Temp 98.5°F | Wt 262.2 lb

## 2019-05-06 DIAGNOSIS — E785 Hyperlipidemia, unspecified: Secondary | ICD-10-CM

## 2019-05-06 DIAGNOSIS — R600 Localized edema: Secondary | ICD-10-CM

## 2019-05-06 DIAGNOSIS — Z23 Encounter for immunization: Secondary | ICD-10-CM | POA: Diagnosis not present

## 2019-05-06 DIAGNOSIS — M1611 Unilateral primary osteoarthritis, right hip: Secondary | ICD-10-CM | POA: Diagnosis not present

## 2019-05-06 DIAGNOSIS — Z209 Contact with and (suspected) exposure to unspecified communicable disease: Secondary | ICD-10-CM | POA: Diagnosis not present

## 2019-05-06 DIAGNOSIS — I10 Essential (primary) hypertension: Secondary | ICD-10-CM

## 2019-05-06 DIAGNOSIS — G629 Polyneuropathy, unspecified: Secondary | ICD-10-CM

## 2019-05-06 MED ORDER — GABAPENTIN 600 MG PO TABS: 600.0000 mg | ORAL_TABLET | Freq: Three times a day (TID) | ORAL | 1 refills | Status: DC

## 2019-05-06 NOTE — Progress Notes (Signed)
   Subjective:    Patient ID: Erik [name], male    DOB: [birth year], 75 y.o.   MRN: [id number]  HPI Here for several issues. First he would like to get his second pneumonia vaccine. He has already received both Covid vaccines. He is fasting and wants to check his cholesterol levels. He has had some swelling in the feet and ankles, though this is not painful. No SOB. His neuropathy has gotten a little worse, and he asks if the Gabapentin can be increased. His hip pain is well manged with 20 mg a day of Prednisone, so he cancelled his hip replacement surgery.   Review of Systems  Constitutional: Negative.   Respiratory: Negative.   Cardiovascular: Positive for leg swelling. Negative for chest pain and palpitations.  Musculoskeletal: Positive for arthralgias and myalgias.       Objective:   Physical Exam Constitutional:      Appearance: Normal appearance.  Cardiovascular:     Rate and Rhythm: Normal rate and regular rhythm.     Pulses: Normal pulses.     Heart sounds: Normal heart sounds.  Pulmonary:     Effort: Pulmonary effort is normal.     Breath sounds: Normal breath sounds.  Musculoskeletal:     Comments: 2+ edema in both ankles   Neurological:     Mental Status: He is alert.           Assessment & Plan:  He was given a pneumococcal 23 vaccine. He will stay on Prednisone 10 mg bid. For the neuropathy we will increase the Gabapentin to 600 mg tid. For the venous insufficiency I suggested he try compression stockings.  [name], MD

## 2019-05-07 LAB — HEPATITIS C ANTIBODY
Hepatitis C Ab: NONREACTIVE
SIGNAL TO CUT-OFF: 0.01 (ref <1.00)

## 2019-05-07 LAB — LIPID PANEL
Cholesterol: 165 mg/dL (ref 0–200)
HDL: 52.4 mg/dL (ref >39.00)
LDL Cholesterol: 84 mg/dL (ref 0–99)
NonHDL: 113.03
Total CHOL/HDL Ratio: 3
Triglycerides: 144 mg/dL (ref 0.0–149.0)
VLDL: 28.8 mg/dL (ref 0.0–40.0)

## 2019-05-10 ENCOUNTER — Other Ambulatory Visit: Payer: Self-pay | Admitting: Family Medicine

## 2019-05-18 NOTE — Patient Instructions (Signed)
Health Maintenance Due  Topic Date Due  . TETANUS/TDAP  06/15/2018    Depression screen [facility] 2/9 02/15/2017 08/25/2014  Decreased Interest 0 0  Down, Depressed, Hopeless 0 0  PHQ - 2 Score 0 0

## 2019-06-01 ENCOUNTER — Telehealth: Payer: Self-pay | Admitting: Family Medicine

## 2019-06-01 NOTE — Telephone Encounter (Signed)
Left message for patient to call back  

## 2019-06-01 NOTE — Telephone Encounter (Signed)
Pt called back to get lab results, pt would like a call back.   Phone: [phone]

## 2019-06-03 DIAGNOSIS — H2513 Age-related nuclear cataract, bilateral: Secondary | ICD-10-CM | POA: Diagnosis not present

## 2019-06-08 NOTE — Telephone Encounter (Signed)
See result notes. 

## 2019-06-12 ENCOUNTER — Telehealth: Payer: Self-pay | Admitting: Family Medicine

## 2019-06-12 MED ORDER — LEVOTHYROXINE SODIUM 50 MCG PO TABS: ORAL_TABLET | ORAL | 0 refills | Status: DC

## 2019-06-12 NOTE — Telephone Encounter (Signed)
Pt wife call and stated that dr.Fry spoke with him about change his levothyroxine (SYNTHROID) 50 MCG tablet from 30 days to 90 days and he need a refill sent  [facility] #[id number] - [place],  - [address] AT [address] Phone:  [phone]  Fax:  [phone]

## 2019-06-12 NOTE — Telephone Encounter (Signed)
Rx sent in for 90 days. Patient is aware.

## 2019-06-16 DIAGNOSIS — H25013 Cortical age-related cataract, bilateral: Secondary | ICD-10-CM | POA: Diagnosis not present

## 2019-06-16 DIAGNOSIS — H16223 Keratoconjunctivitis sicca, not specified as Sjogren's, bilateral: Secondary | ICD-10-CM | POA: Diagnosis not present

## 2019-06-16 DIAGNOSIS — H2513 Age-related nuclear cataract, bilateral: Secondary | ICD-10-CM | POA: Diagnosis not present

## 2019-06-16 DIAGNOSIS — H2512 Age-related nuclear cataract, left eye: Secondary | ICD-10-CM | POA: Diagnosis not present

## 2019-06-16 DIAGNOSIS — H18413 Arcus senilis, bilateral: Secondary | ICD-10-CM | POA: Diagnosis not present

## 2019-06-16 DIAGNOSIS — H25043 Posterior subcapsular polar age-related cataract, bilateral: Secondary | ICD-10-CM | POA: Diagnosis not present

## 2019-06-22 ENCOUNTER — Telehealth: Payer: Self-pay | Admitting: Internal Medicine

## 2019-06-23 ENCOUNTER — Ambulatory Visit (INDEPENDENT_AMBULATORY_CARE_PROVIDER_SITE_OTHER): Payer: Medicare Other | Admitting: *Deleted

## 2019-06-23 DIAGNOSIS — I4819 Other persistent atrial fibrillation: Secondary | ICD-10-CM | POA: Diagnosis not present

## 2019-06-23 NOTE — Telephone Encounter (Signed)
Patient's wife is calling to follow up on previous message. She has questions on banding procedure.

## 2019-06-23 NOTE — Telephone Encounter (Signed)
Wife's questions answered. She is reassured that Dr. Carlean Purl will examine her husband at the appt on 4/29 and if appropriate he can band him there

## 2019-06-24 LAB — CUP PACEART REMOTE DEVICE CHECK
Battery Remaining Longevity: 115 mo
Battery Remaining Percentage: 95.5 %
Battery Voltage: 2.99 V
Brady Statistic AP VP Percent: 1 %
Brady Statistic AP VS Percent: 72 %
Brady Statistic AS VP Percent: 1 %
Brady Statistic AS VS Percent: 28 %
Brady Statistic RA Percent Paced: 71 %
Brady Statistic RV Percent Paced: 1 %
Date Time Interrogation Session: 20210427235051
Implantable Lead Implant Date: 20170501
Implantable Lead Implant Date: 20170501
Implantable Lead Location: 753859
Implantable Lead Location: 753860
Implantable Pulse Generator Implant Date: 20170501
Lead Channel Impedance Value: 410 Ohm
Lead Channel Impedance Value: 490 Ohm
Lead Channel Pacing Threshold Amplitude: 0.875 V
Lead Channel Pacing Threshold Amplitude: 1 V
Lead Channel Pacing Threshold Pulse Width: 0.5 ms
Lead Channel Pacing Threshold Pulse Width: 0.5 ms
Lead Channel Sensing Intrinsic Amplitude: 4.6 mV
Lead Channel Sensing Intrinsic Amplitude: 8.8 mV
Lead Channel Setting Pacing Amplitude: 1.875
Lead Channel Setting Pacing Amplitude: 2.5 V
Lead Channel Setting Pacing Pulse Width: 0.5 ms
Lead Channel Setting Sensing Sensitivity: 2 mV
Pulse Gen Model: 2272
Pulse Gen Serial Number: 7897836

## 2019-06-24 NOTE — Progress Notes (Signed)
PPM Remote  

## 2019-06-25 ENCOUNTER — Encounter: Payer: Self-pay | Admitting: Internal Medicine

## 2019-06-25 ENCOUNTER — Ambulatory Visit (INDEPENDENT_AMBULATORY_CARE_PROVIDER_SITE_OTHER): Payer: Medicare Other | Admitting: Internal Medicine

## 2019-06-25 DIAGNOSIS — K641 Second degree hemorrhoids: Secondary | ICD-10-CM

## 2019-06-25 NOTE — Patient Instructions (Signed)
HEMORRHOID BANDING PROCEDURE    FOLLOW-UP CARE   1. The procedure you have had should have been relatively painless since the banding of the area involved does not have nerve endings and there is no pain sensation.  The rubber band cuts off the blood supply to the hemorrhoid and the band may fall off as soon as 48 hours after the banding (the band may occasionally be seen in the toilet bowl following a bowel movement). You may notice a temporary feeling of fullness in the rectum which should respond adequately to plain Tylenol or Motrin.  2. Following the banding, avoid strenuous exercise that evening and resume full activity the next day.  A sitz bath (soaking in a warm tub) or bidet is soothing, and can be useful for cleansing the area after bowel movements.     3. To avoid constipation, take two tablespoons of natural wheat bran, natural oat bran, flax, Benefiber or any over the counter fiber supplement and increase your water intake to 7-8 glasses daily.    4. Unless you have been prescribed anorectal medication, do not put anything inside your rectum for two weeks: No suppositories, enemas, fingers, etc.  5. Occasionally, you may have more bleeding than usual after the banding procedure.  This is often from the untreated hemorrhoids rather than the treated one.  Don't be concerned if there is a tablespoon or so of blood.  If there is more blood than this, lie flat with your bottom higher than your head and apply an ice pack to the area. If the bleeding does not stop within a half an hour or if you feel faint, call our office at [phone] or go to the emergency room.  6. Problems are not common; however, if there is a substantial amount of bleeding, severe pain, chills, fever or difficulty passing urine (very rare) or other problems, you should call us at [phone] or report to the nearest emergency room.  7. Do not stay seated continuously for more than 2-3 hours for a day or two  after the procedure.  Tighten your buttock muscles 10-15 times every two hours and take 10-15 deep breaths every 1-2 hours.  Do not spend more than a few minutes on the toilet if you cannot empty your bowel; instead re-visit the toilet at a later time.    Please come back to see [place] in 6-8 weeks.   I appreciate the opportunity to care for you. Silvano Rusk, MD, [facility]

## 2019-06-25 NOTE — Progress Notes (Signed)
Erik [name] 74 y.o. May 13, 1945 [id number]  Assessment & Plan:   Prolapsed internal hemorrhoids, grade 2-3 I banded the left lateral hemorrhoid again today.  He will return in 6 to 8 weeks for reassessment.   CC: Erik Morale, MD   Subjective:   Chief Complaint: Symptomatic hemorrhoids bleeding  HPI The patient is a 74 year old man status post banding of a left lateral grade 3 prolapsed internal hemorrhoid on 3 other times in 2018.  Since November 2018 he had done well until about a month ago where he is having recurrent bleeding problems.  He is not describing grade 3 prolapse anymore.  He does take Xarelto.  He reports that he moves his bowels without significant difficulty these days very rare straining to stool. No Known Allergies Current Meds  Medication Sig  . acetaminophen (TYLENOL) 500 MG tablet Take 1,000 mg by mouth every 6 (six) hours as needed.  . diltiazem (CARDIZEM CD) 300 MG 24 hr capsule TAKE 1 CAPSULE BY MOUTH EVERY DAY  . diltiazem (CARDIZEM) 30 MG tablet Cardizem 30mg  -- take 1 tablet every 4 hours AS NEEDED for heart rate >100 as long as blood pressure >100.  . gabapentin (NEURONTIN) 600 MG tablet Take 1 tablet (600 mg total) by mouth 3 (three) times daily.  . hydrochlorothiazide (HYDRODIURIL) 25 MG tablet TAKE 1 TABLET(25 MG) BY MOUTH DAILY  . ibuprofen (ADVIL,MOTRIN) 200 MG tablet Take 200 mg by mouth every 8 (eight) hours as needed for headache or moderate pain.   [name] levothyroxine (SYNTHROID) 50 MCG tablet TAKE 1 TABLET(50 MCG) BY MOUTH DAILY  . losartan (COZAAR) 100 MG tablet TAKE 1 TABLET BY MOUTH DAILY  . predniSONE (DELTASONE) 10 MG tablet TAKE 1 TABLET BY MOUTH TWICE DAILY AS NEEDED  . simvastatin (ZOCOR) 20 MG tablet TAKE 1 TABLET BY MOUTH EVERY DAY  . tamsulosin (FLOMAX) 0.4 MG CAPS capsule TAKE ONE CAPSULE BY MOUTH EVERY DAY  . XARELTO 20 MG TABS tablet TAKE 1 TABLET BY MOUTH DAILY WITH SUPPER   Past Medical History:  Diagnosis Date  .  Anxiety   . Arthritis   . Atypical chest pain 06/26/2015  . BPH with urinary obstruction   . Carotid disease, bilateral ([place])    a. 09/2014 Carotid U/S: 1-39% bilat ICA stenosis.  . Chronic lower back pain   . CKD (chronic kidney disease), stage III    pt. denies  . Diverticulitis 12/2016  . Diverticulosis   . Dyspnea   . Gout    "couple days/year" (05/17/2017)  . History of kidney stones   . Hyperlipidemia   . Hypertension   . Hypertensive heart disease   . Hypothyroidism   . Internal hemorrhoids   . Long QT interval 06/26/2015  . Nephrolithiasis   . Neuropathy 07/30/2017  . Non-obstructive CAD    a. 07/2002 Cath: LM 20, LAD 89m/d, LCX 50-20m, OM1 4m, RCA 7m, EF 60%; b. 05/2014 MV: low risk w/ small sized, mild intensity rev defect in apical/inferior/infsept area, nl EF->Med Rx.  . Panic attack   . Paroxysmal atrial fibrillation ([place])    a. Dx 04/2014; b. 05/2014 Echo: Ef 60-65%, no rwma, triv MR/TR, nl RV;  c. CHA2DS2VASc = 3-->was on eliquis, switched to xarelto 04/2015 2/2 cost; d. 05/2015 Tikosyn loaded w/ conversion to AF; e. 06/2015 QTc prolongation and bradycardia->tikosyn d/c'd, PPM placed, Amio started; f. 07/01/2015 In Aflutter @ clinic f/u.  [name] Paroxysmal atrial flutter ([place])    a. 06/2015 noted to  be in rapid Aflutter in Afib clinic-->amio load continued.  . Persistent atrial fibrillation ([place]) 06/21/2015  . Presence of permanent cardiac pacemaker    St. Jude  . Prolapsed internal hemorrhoids, grade 2-3 10/19/2016   LL Gr 3 and RP/RA Gr 2 LL banded 10/19/2016   . RLL pneumonia 11/05/2012  . Sleep apnea    "can't tolerate mask" (05/17/2017)  . Tachy-brady syndrome ([place])    a. 06/27/2015 s/p SJM DC PPM (ser # @ [id number]).   Past Surgical History:  Procedure Laterality Date  . ATRIAL FIBRILLATION ABLATION N/A 05/17/2017   Procedure: ATRIAL FIBRILLATION ABLATION;  Surgeon: Constance Haw, MD;  Location: [facility];  Service: Cardiovascular;  Laterality: N/A;  . BACK  SURGERY    . CARDIAC CATHETERIZATION  ~ 2000  . CLOSED REDUCTION HAND FRACTURE Right 1984  . COLONOSCOPY  06/27/2015   per Dr. Henrene Pastor, internal hemorrhoids and diverticulae, repeat 10 yrs  . CYSTOSCOPY WITH RETROGRADE PYELOGRAM, URETEROSCOPY AND STENT PLACEMENT Bilateral 02/14/2018   Procedure: CYSTOSCOPY WITH RETROGRADE PYELOGRAM, URETEROSCOPY AND STENT PLACEMENT;  Surgeon: Alexis Frock, MD;  Location: [facility];  Service: Urology;  Laterality: Bilateral;  . EP IMPLANTABLE DEVICE N/A 06/27/2015   Procedure: Pacemaker Implant;  Surgeon: Evans Lance, MD;  Location: [facility];  Service: Cardiovascular;  Laterality: N/A;  . FRACTURE SURGERY    . HEMORRHOID BANDING    . HOLMIUM LASER APPLICATION Bilateral 89/21/1941   Procedure: HOLMIUM LASER APPLICATION;  Surgeon: Alexis Frock, MD;  Location: [facility];  Service: Urology;  Laterality: Bilateral;  . INSERT / REPLACE / REMOVE PACEMAKER    . LACERATION REPAIR Right 1984   "hand"  . POSTERIOR LUMBAR FUSION  1998   2 lumbar discs, Dr. Ellene Route; "ray cages"   Social History   Social History Narrative   He is married and retired he has 1 child   family history includes Cancer in his paternal aunt; Dementia in his mother; Diabetes in his paternal grandmother; Stroke in his father and paternal aunt.   Review of Systems As above  Objective:   Physical Exam BP 110/60 (BP Location: Left Arm, Patient Position: Sitting, Cuff Size: Normal)   Pulse 76   Temp 98.7 F (37.1 C)   Ht 5\' 9"  (1.753 m) Comment: height measured without shoes  Wt 261 lb 4 oz (118.5 kg)   BMI 38.58 kg/m    Rectal - sl bulge LL perianal tissues otherwise normal anoderm no prolapse of his hemorrhoids with Valsalva digital rectal exam is nontender without mass   Anoscopy  Demonstrates grade 2 inflamed left lateral-posterior internal hemorrhoid complex.  Grade 1 right anterior right posterior   PROCEDURE NOTE: The patient presents with symptomatic grade 2   hemorrhoids, requesting rubber band ligation of his/her hemorrhoidal disease.  All risks, benefits and alternative forms of therapy were described and informed consent was obtained.   The anorectum was pre-medicated with 0.125% NTG and 5% lidocaine The decision was made to band the LL internal hemorrhoid, and the [place] was used to perform band ligation without complication.  Digital anorectal examination was then performed to assure proper positioning of the band, and to adjust the banded tissue as required.  The patient was discharged home without pain or other issues.  Dietary and behavioral recommendations were given and along with follow-up instructions.

## 2019-06-25 NOTE — Assessment & Plan Note (Signed)
I banded the left lateral hemorrhoid again today.  He will return in 6 to 8 weeks for reassessment.

## 2019-06-30 ENCOUNTER — Other Ambulatory Visit: Payer: Self-pay

## 2019-06-30 ENCOUNTER — Encounter: Payer: Self-pay | Admitting: Cardiology

## 2019-06-30 ENCOUNTER — Ambulatory Visit (INDEPENDENT_AMBULATORY_CARE_PROVIDER_SITE_OTHER): Payer: Medicare Other | Admitting: Cardiology

## 2019-06-30 VITALS — BP 124/60 | HR 66 | Ht 70.0 in | Wt 252.0 lb

## 2019-06-30 DIAGNOSIS — R001 Bradycardia, unspecified: Secondary | ICD-10-CM | POA: Diagnosis not present

## 2019-06-30 DIAGNOSIS — I4819 Other persistent atrial fibrillation: Secondary | ICD-10-CM

## 2019-06-30 MED ORDER — DILTIAZEM HCL ER COATED BEADS 180 MG PO CP24: 180.0000 mg | ORAL_CAPSULE | Freq: Every day | ORAL | 2 refills | Status: DC

## 2019-06-30 MED ORDER — FUROSEMIDE 20 MG PO TABS: 20.0000 mg | ORAL_TABLET | Freq: Every day | ORAL | 0 refills | Status: DC

## 2019-06-30 NOTE — Progress Notes (Signed)
Electrophysiology Office Note   Date:  06/30/2019   ID:  Erik [name], DOB [birth year], MRN [id number]  PCP:  [name], MD  Cardiologist:  [name] Primary Electrophysiologist:  Constance Haw, MD    No chief complaint on file.    History of Present Illness: Erik [name] is a 74 y.o. male who presents today for electrophysiology evaluation.   He presents today for workup of his atrial fibrillation.  He has a history of nonobstructive coronary artery disease, hypertension, hyperlipidemia, hypothyroidism. In March 2016, he was diagnosed with atrial fibrillation with RVR in the setting of chest pain. He was loaded on tikosyn which was stopped as he went back in atrial fibrillation on 500 mcg twice a day..  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  He had an atrial fibrillation ablation 05/17/2017.  Today, denies symptoms of palpitations, chest pain, shortness of breath, orthopnea, PND, lower extremity edema, claudication, dizziness, presyncope, syncope, bleeding, or neurologic sequela. The patient is tolerating medications without difficulties.  He feels weak and fatigued as well as lower extremity edema.  He also has multiple orthopedic complaints.  He has been weak and fatigued for the past few months.  His primary physician prescribed him prednisone 10 mg which he has been taking on a daily basis.  He has gained up to 30 pounds since starting this medication.  He also has significant lower extremity edema.  Past Medical History:  Diagnosis Date  . Anxiety   . Arthritis   . Atypical chest pain 06/26/2015  . BPH with urinary obstruction   . Carotid disease, bilateral ([place])    a. 09/2014 Carotid U/S: 1-39% bilat ICA stenosis.  . Chronic lower back pain   . CKD (chronic kidney disease), stage III    pt. denies  . Diverticulitis 12/2016  . Diverticulosis   . Dyspnea   . Gout    "couple days/year" (05/17/2017)  . History of kidney stones   .  Hyperlipidemia   . Hypertension   . Hypertensive heart disease   . Hypothyroidism   . Internal hemorrhoids   . Long QT interval 06/26/2015  . Nephrolithiasis   . Neuropathy 07/30/2017  . Non-obstructive CAD    a. 07/2002 Cath: LM 20, LAD 34m/d, LCX 50-58m, OM1 64m, RCA 24m, EF 60%; b. 05/2014 MV: low risk w/ small sized, mild intensity rev defect in apical/inferior/infsept area, nl EF->Med Rx.  . Panic attack   . Paroxysmal atrial fibrillation ([place])    a. Dx 04/2014; b. 05/2014 Echo: Ef 60-65%, no rwma, triv MR/TR, nl RV;  c. CHA2DS2VASc = 3-->was on eliquis, switched to xarelto 04/2015 2/2 cost; d. 05/2015 Tikosyn loaded w/ conversion to AF; e. 06/2015 QTc prolongation and bradycardia->tikosyn d/c'd, PPM placed, Amio started; f. 07/01/2015 In Aflutter @ clinic f/u.  [name] Paroxysmal atrial flutter ([place])    a. 06/2015 noted to be in rapid Aflutter in Afib clinic-->amio load continued.  . Persistent atrial fibrillation ([place]) 06/21/2015  . Presence of permanent cardiac pacemaker    St. Jude  . Prolapsed internal hemorrhoids, grade 2-3 10/19/2016   LL Gr 3 and RP/RA Gr 2 LL banded 10/19/2016   . RLL pneumonia 11/05/2012  . Sleep apnea    "can't tolerate mask" (05/17/2017)  . Tachy-brady syndrome ([place])    a. 06/27/2015 s/p SJM DC PPM (ser # @ [id number]).   Past Surgical History:  Procedure Laterality Date  . ATRIAL FIBRILLATION ABLATION N/A 05/17/2017  Procedure: ATRIAL FIBRILLATION ABLATION;  Surgeon: Constance Haw, MD;  Location: [facility];  Service: Cardiovascular;  Laterality: N/A;  . BACK SURGERY    . CARDIAC CATHETERIZATION  ~ 2000  . CLOSED REDUCTION HAND FRACTURE Right 1984  . COLONOSCOPY  06/27/2015   per Dr. Henrene Pastor, internal hemorrhoids and diverticulae, repeat 10 yrs  . CYSTOSCOPY WITH RETROGRADE PYELOGRAM, URETEROSCOPY AND STENT PLACEMENT Bilateral 02/14/2018   Procedure: CYSTOSCOPY WITH RETROGRADE PYELOGRAM, URETEROSCOPY AND STENT PLACEMENT;  Surgeon: Alexis Frock, MD;  Location:  [facility];  Service: Urology;  Laterality: Bilateral;  . EP IMPLANTABLE DEVICE N/A 06/27/2015   Procedure: Pacemaker Implant;  Surgeon: Evans Lance, MD;  Location: [facility];  Service: Cardiovascular;  Laterality: N/A;  . FRACTURE SURGERY    . HEMORRHOID BANDING    . HOLMIUM LASER APPLICATION Bilateral 96/05/5407   Procedure: HOLMIUM LASER APPLICATION;  Surgeon: Alexis Frock, MD;  Location: [facility];  Service: Urology;  Laterality: Bilateral;  . INSERT / REPLACE / REMOVE PACEMAKER    . LACERATION REPAIR Right 1984   "hand"  . POSTERIOR LUMBAR FUSION  1998   2 lumbar discs, Dr. Ellene Route; "ray cages"     Current Outpatient Medications  Medication Sig Dispense Refill  . acetaminophen (TYLENOL) 500 MG tablet Take 1,000 mg by mouth every 6 (six) hours as needed.    . diltiazem (CARDIZEM CD) 300 MG 24 hr capsule TAKE 1 CAPSULE BY MOUTH EVERY DAY 90 capsule 2  . diltiazem (CARDIZEM) 30 MG tablet Cardizem 30mg  -- take 1 tablet every 4 hours AS NEEDED for heart rate >100 as long as blood pressure >100. 90 tablet 1  . gabapentin (NEURONTIN) 600 MG tablet Take 1 tablet (600 mg total) by mouth 3 (three) times daily. 270 tablet 1  . hydrochlorothiazide (HYDRODIURIL) 25 MG tablet TAKE 1 TABLET(25 MG) BY MOUTH DAILY 90 tablet 0  . ibuprofen (ADVIL,MOTRIN) 200 MG tablet Take 200 mg by mouth every 8 (eight) hours as needed for headache or moderate pain.     [name] levothyroxine (SYNTHROID) 50 MCG tablet TAKE 1 TABLET(50 MCG) BY MOUTH DAILY 90 tablet 0  . losartan (COZAAR) 100 MG tablet TAKE 1 TABLET BY MOUTH DAILY 90 tablet 3  . predniSONE (DELTASONE) 10 MG tablet TAKE 1 TABLET BY MOUTH TWICE DAILY AS NEEDED 60 tablet 2  . simvastatin (ZOCOR) 20 MG tablet TAKE 1 TABLET BY MOUTH EVERY DAY 90 tablet 2  . tamsulosin (FLOMAX) 0.4 MG CAPS capsule TAKE ONE CAPSULE BY MOUTH EVERY DAY 90 capsule 3  . XARELTO 20 MG TABS tablet TAKE 1 TABLET BY MOUTH DAILY WITH SUPPER 90 tablet 2   No current  facility-administered medications for this visit.    Allergies:   Patient has no known allergies.   Social History:  The patient  reports that he quit smoking about 24 years ago. His smoking use included cigarettes. He has a 56.00 pack-year smoking history. He has never used smokeless tobacco. He reports previous alcohol use. He reports that he does not use drugs.   Family History:  The patient's family history includes Cancer in his paternal aunt; Dementia in his mother; Diabetes in his paternal grandmother; Stroke in his father and paternal aunt.   ROS:  Please see the history of present illness.   Otherwise, review of systems is positive for none.   All other systems are reviewed and negative.   PHYSICAL EXAM: VS:  BP 124/60   Pulse 66  Ht 5\' 10"  (1.778 m)   Wt 252 lb (114.3 kg)   BMI 36.16 kg/m  , BMI Body mass index is 36.16 kg/m. GEN: Well nourished, well developed, in no acute distress  HEENT: normal  Neck: no JVD, carotid bruits, or masses Cardiac: RRR; no murmurs, rubs, or gallops, 2+ edema at the ankle Respiratory:  clear to auscultation bilaterally, normal work of breathing GI: soft, nontender, nondistended, + BS MS: no deformity or atrophy  Skin: warm and dry, device site well healed Neuro:  Strength and sensation are intact Psych: euthymic mood, full affect  EKG:  EKG is ordered today. Personal review of the ekg ordered shows sinus rhythm, rate 66  Personal review of the device interrogation today. Results in [facility]: 01/14/2019: ALT 15; BUN 20; Creatinine, Ser 1.39; Hemoglobin 14.7; Platelets 250.0; Potassium 3.7; Sodium 142; TSH 0.91    Lipid Panel     Component Value Date/Time   CHOL 165 05/06/2019 1519   TRIG 144.0 05/06/2019 1519   TRIG 203 (HH) 12/10/2005 0839   HDL 52.40 05/06/2019 1519   CHOLHDL 3 05/06/2019 1519   VLDL 28.8 05/06/2019 1519   LDLCALC 84 05/06/2019 1519   LDLDIRECT 104.9 06/09/2009 0919     Wt Readings from  Last 3 Encounters:  06/30/19 252 lb (114.3 kg)  06/25/19 261 lb 4 oz (118.5 kg)  05/06/19 262 lb 3.2 oz (118.9 kg)      Other studies Reviewed: Additional studies/ records that were reviewed today include: TTE 11/10/15 Review of the above records today demonstrates:  - Left ventricle: The cavity size was normal. Wall thickness was   increased in a pattern of mild LVH. The estimated ejection   fraction was 55%. Wall motion was normal; there were no regional   wall motion abnormalities. Doppler parameters are consistent with   abnormal left ventricular relaxation (grade 1 diastolic   dysfunction). - Aortic valve: There was no stenosis. - Mitral valve: There was trivial regurgitation. - Left atrium: The atrium was mildly dilated. - Right ventricle: The cavity size was normal. Pacer wire or   catheter noted in right ventricle. Systolic function was normal. - Pulmonary arteries: No complete TR doppler jet so unable to   estimate PA systolic pressure. - Inferior vena cava: The vessel was normal in size. The   respirophasic diameter changes were in the normal range (= 50%),   consistent with normal central venous pressure. - Pericardium, extracardiac: A trivial pericardial effusion was   identified posterior to the heart.  Myoview 06/04/14 Low risk stress nuclear study with a small sized, mild intensity reversible defect concering for mild apical-inferior/inferoseptal ischeima.  Clinical Correlation is recommended.  ASSESSMENT AND PLAN:  1.  Persistent atrial fibrillation: Status post ablation 04/27/2017.  On Xarelto and diltiazem.  CHA2DS2-VASc of 2.  He has had minimal episodes of atrial fibrillation since his last interrogation in less than 1%.  He currently is unaware of palpitations.   2. Hypertension: Currently well controlled  3.  Sinus node dysfunction: Status post Saint Jude dual-chamber pacemaker.  Device functioning appropriately.  No changes.  4.  Lower extremity edema: Could  be caused somewhat by his high dose of diltiazem as well as his daily prednisone.  I Loriel Diehl give him 20 mg of Lasix to take for the next 5 days.  We Camiyah Friberg also cut his diltiazem down to 180 mg.  Current medicines are reviewed at length with the patient today.   The  patient does not have concerns regarding his medicines.  The following changes were made today: Start Lasix, decrease diltiazem  Labs/ tests ordered today include:  Orders Placed This Encounter  Procedures  . EKG 12-Lead     Disposition:   FU with Keysi Oelkers 6 months  Signed, Ericha Whittingham [name], MD  06/30/2019 3:48 PM     [facility] [address] [place] [address] [phone] (office) [phone]

## 2019-06-30 NOTE — Addendum Note (Signed)
Addended by: Stanton Kidney on: 06/30/2019 04:05 PM   Modules accepted: Orders

## 2019-06-30 NOTE — Patient Instructions (Addendum)
Medication Instructions:  Your physician has recommended you make the following change in your medication:  1. TAKE Lasix (Furosemide) 20 mg once daily for the next 5 days and stop. 2. DECREASE Diltiazem to 180 mg once daily  *If you need a refill on your cardiac medications before your next appointment, please call your pharmacy*   Lab Work: None ordered If you have labs (blood work) drawn today and your tests are completely normal, you will receive your results only by: [name] MyChart Message (if you have MyChart) OR . A paper copy in the mail If you have any lab test that is abnormal or we need to change your treatment, we will call you to review the results.   Testing/Procedures: None ordered   Follow-Up: At [facility], you and your health needs are our priority.  As part of our continuing mission to provide you with exceptional heart care, we have created designated Provider Care Teams.  These Care Teams include your primary Cardiologist (physician) and Advanced Practice Providers (APPs -  Physician Assistants and Nurse Practitioners) who all work together to provide you with the care you need, when you need it.  We recommend signing up for the patient portal called "MyChart".  Sign up information is provided on this After Visit Summary.  MyChart is used to connect with patients for Virtual Visits (Telemedicine).  Patients are able to view lab/test results, encounter notes, upcoming appointments, etc.  Non-urgent messages can be sent to your provider as well.   To learn more about what you can do with MyChart, go to [email or web address].    Your next appointment:   6 month(s)  The format for your next appointment:   In Person  Provider:   Allegra Lai, MD   Thank you for choosing [facility]!!   Trinidad Curet, RN [phone]    Other Instructions  Call the office if you retain fluid/swelling.

## 2019-07-02 ENCOUNTER — Other Ambulatory Visit: Payer: Self-pay

## 2019-07-03 ENCOUNTER — Ambulatory Visit (INDEPENDENT_AMBULATORY_CARE_PROVIDER_SITE_OTHER): Payer: Medicare Other | Admitting: Family Medicine

## 2019-07-03 ENCOUNTER — Encounter: Payer: Self-pay | Admitting: Family Medicine

## 2019-07-03 VITALS — BP 124/50 | HR 84 | Temp 98.0°F | Wt 261.4 lb

## 2019-07-03 DIAGNOSIS — M25552 Pain in left hip: Secondary | ICD-10-CM

## 2019-07-03 DIAGNOSIS — G629 Polyneuropathy, unspecified: Secondary | ICD-10-CM

## 2019-07-03 DIAGNOSIS — M25551 Pain in right hip: Secondary | ICD-10-CM | POA: Diagnosis not present

## 2019-07-03 DIAGNOSIS — L989 Disorder of the skin and subcutaneous tissue, unspecified: Secondary | ICD-10-CM

## 2019-07-03 MED ORDER — HYDROCODONE-ACETAMINOPHEN 10-325 MG PO TABS: 1.0000 | ORAL_TABLET | ORAL | 0 refills | Status: AC | PRN

## 2019-07-03 NOTE — Progress Notes (Signed)
   Subjective:    Patient ID: Erik [name], male    DOB: 1945/11/08, 74 y.o.   MRN: [id number]  HPI Here for several issues. First he has had worsening bilateral hip pains for several years. This began in the left hip and now the right hip has also been causing pain for 6 weeks. He has used Tylenol with little relief. His neuropathy has also gotten a little worse. He has been using a combination of Prednisone and Gabapentin, but he wants to get off Prednisone. Also he has a lesion appear on the scalp a few months ago. This does not bother him.    Review of Systems  Constitutional: Negative.   Cardiovascular: Negative.   Musculoskeletal: Positive for arthralgias.       Objective:   Physical Exam Constitutional:      Appearance: Normal appearance. He is well-developed. He is not ill-appearing.  Cardiovascular:     Rate and Rhythm: Normal rate and regular rhythm.     Pulses: Normal pulses.     Heart sounds: Normal heart sounds.  Pulmonary:     Effort: Pulmonary effort is normal.     Breath sounds: Normal breath sounds.  Skin:    Comments: There is a raised scabbed lesion on the left parietal scalp which may be a basal cell cancer   Neurological:     Mental Status: He is alert.           Assessment & Plan:  For the bilateral hip pain we will give him a short supply of Norco to use as needed. Refer to Orthopedics. For the neuropathy he will taper off Prednisone over the next 2 weeks. We will increase the Gabapentin to 2 tabs (1200 mg) TID. For the skin lesion, refer to Dermatology.  [name], MD

## 2019-07-06 ENCOUNTER — Encounter: Payer: Self-pay | Admitting: Family Medicine

## 2019-07-06 ENCOUNTER — Other Ambulatory Visit: Payer: Self-pay

## 2019-07-06 ENCOUNTER — Ambulatory Visit (INDEPENDENT_AMBULATORY_CARE_PROVIDER_SITE_OTHER): Payer: Medicare Other | Admitting: Family Medicine

## 2019-07-06 VITALS — BP 100/60 | HR 71 | Temp 97.2°F | Ht 70.0 in | Wt 265.0 lb

## 2019-07-06 DIAGNOSIS — B029 Zoster without complications: Secondary | ICD-10-CM | POA: Insufficient documentation

## 2019-07-06 MED ORDER — VALACYCLOVIR HCL 1 G PO TABS: 1000.0000 mg | ORAL_TABLET | Freq: Three times a day (TID) | ORAL | 0 refills | Status: DC

## 2019-07-06 MED ORDER — GABAPENTIN 600 MG PO TABS: 1200.0000 mg | ORAL_TABLET | Freq: Three times a day (TID) | ORAL | 1 refills | Status: DC

## 2019-07-06 NOTE — Progress Notes (Signed)
   Subjective:    Patient ID: Erik [name], male    DOB: 1945/07/15, 74 y.o.   MRN: [id number]  HPI Here for one week of sharp pains in the right forehead and face, and now with 2 days of blisters in these areas. He has no vision complaints.    Review of Systems  Constitutional: Negative.   Eyes: Negative.   Respiratory: Negative.   Cardiovascular: Negative.   Skin: Positive for rash.       Objective:   Physical Exam Constitutional:      Appearance: Normal appearance.  Cardiovascular:     Rate and Rhythm: Normal rate and regular rhythm.     Pulses: Normal pulses.     Heart sounds: Normal heart sounds.  Pulmonary:     Effort: Pulmonary effort is normal.     Breath sounds: Normal breath sounds.  Skin:    Comments: Scattered vesicles over the right parietal scalp, right forehead, and right upper eyelid. The eye is clear   Neurological:     Mental Status: He is alert.           Assessment & Plan:  This is shingles. Treat with Valtrex for 10 days. He knows to see someone immediately if he develops any eye pain or eye redness or blurred vision.  [name], MD

## 2019-07-08 ENCOUNTER — Other Ambulatory Visit: Payer: Self-pay | Admitting: Cardiology

## 2019-07-08 ENCOUNTER — Telehealth: Payer: Self-pay | Admitting: Cardiology

## 2019-07-08 NOTE — Telephone Encounter (Signed)
Pt c/o medication issue:  1. Name of Medication: furosemide (LASIX) 20 MG tablet  2. How are you currently taking this medication (dosage and times per day)? As directed  3. Are you having a reaction (difficulty breathing--STAT)? no  4. What is your medication issue? Patient states he only has one days left but is not sure if Dr. Curt Bears wants to refill the medication. Please advise.

## 2019-07-08 NOTE — Telephone Encounter (Signed)
Pt reports swelling improved since seeing [facility]. He does state that it has gone down, then increased a little, then went down again. Reports it is probably 1/2 of what it was before. Pt will keep monitoring. He will call office if swelling reoccurs/returns to what it was so that we can determine updated treatment plan if needed. Pt is agreeable to plan.

## 2019-07-11 ENCOUNTER — Other Ambulatory Visit: Payer: Self-pay | Admitting: Family Medicine

## 2019-07-20 DIAGNOSIS — M160 Bilateral primary osteoarthritis of hip: Secondary | ICD-10-CM | POA: Diagnosis not present

## 2019-07-28 ENCOUNTER — Other Ambulatory Visit: Payer: Self-pay | Admitting: Family Medicine

## 2019-07-28 ENCOUNTER — Telehealth: Payer: Self-pay | Admitting: Cardiology

## 2019-07-28 MED ORDER — FUROSEMIDE 20 MG PO TABS
ORAL_TABLET | ORAL | 0 refills | Status: DC
Start: 2019-07-28 — End: 2019-07-31

## 2019-07-28 NOTE — Telephone Encounter (Signed)
Spoke to pt and wife. Pt reporting bilateral ankle/pedal edema. Wife, who is a Marine scientist, reports 3+ pitting edema. Denies CP, SOB, dizziness. States that when he took the temporary Lasix early May for 5 days the swelling improved and gradually came back after stopping it. Pt scheduled for next available in office visit on 6/4 w/ APP to further evaluate and determine next stet in treatment plan.  Pt and wife agreeable to plan.

## 2019-07-28 NOTE — Telephone Encounter (Signed)
*  STAT* If patient is at the pharmacy, call can be transferred to refill team.   1. Which medications need to be refilled? (please list name of each medication and dose if known) furosemide (LASIX) 20 MG tablet  2. Which pharmacy/location (including street and city if local pharmacy) is medication to be sent to? [id number] - [place], [place] - [address] AT [facility] OF [address]  3. Do they need a 30 day or 90 day supply? 90 day supply

## 2019-07-28 NOTE — Telephone Encounter (Signed)
Pt calling stating that he needs more medication of Furosemide 20 mg tablet. Pt states that he is taking 1 1/2 tablets and would like Dr. Macky Lower nurse Venida Jarvis, RN to give him a call concerning this matter. Please address

## 2019-07-29 NOTE — Telephone Encounter (Signed)
Call in #[phone] with [phone] rf 

## 2019-07-31 ENCOUNTER — Other Ambulatory Visit: Payer: Self-pay | Admitting: Orthopaedic Surgery

## 2019-07-31 ENCOUNTER — Encounter: Payer: Self-pay | Admitting: Cardiology

## 2019-07-31 ENCOUNTER — Other Ambulatory Visit: Payer: Self-pay

## 2019-07-31 ENCOUNTER — Ambulatory Visit (INDEPENDENT_AMBULATORY_CARE_PROVIDER_SITE_OTHER): Payer: Medicare Other | Admitting: Cardiology

## 2019-07-31 VITALS — HR 83 | Ht 70.0 in | Wt 260.0 lb

## 2019-07-31 DIAGNOSIS — I4819 Other persistent atrial fibrillation: Secondary | ICD-10-CM

## 2019-07-31 DIAGNOSIS — R0602 Shortness of breath: Secondary | ICD-10-CM

## 2019-07-31 DIAGNOSIS — E861 Hypovolemia: Secondary | ICD-10-CM

## 2019-07-31 DIAGNOSIS — R609 Edema, unspecified: Secondary | ICD-10-CM | POA: Diagnosis not present

## 2019-07-31 DIAGNOSIS — I779 Disorder of arteries and arterioles, unspecified: Secondary | ICD-10-CM | POA: Diagnosis not present

## 2019-07-31 DIAGNOSIS — I2510 Atherosclerotic heart disease of native coronary artery without angina pectoris: Secondary | ICD-10-CM

## 2019-07-31 DIAGNOSIS — Z950 Presence of cardiac pacemaker: Secondary | ICD-10-CM

## 2019-07-31 DIAGNOSIS — I9589 Other hypotension: Secondary | ICD-10-CM | POA: Diagnosis not present

## 2019-07-31 MED ORDER — FUROSEMIDE 20 MG PO TABS
20.0000 mg | ORAL_TABLET | Freq: Every day | ORAL | 3 refills | Status: DC
Start: 2019-07-31 — End: 2019-08-26

## 2019-07-31 MED ORDER — POTASSIUM CHLORIDE ER 10 MEQ PO TBCR: 10.0000 meq | EXTENDED_RELEASE_TABLET | Freq: Every day | ORAL | 3 refills | Status: DC

## 2019-07-31 MED ORDER — LOSARTAN POTASSIUM 50 MG PO TABS: 50.0000 mg | ORAL_TABLET | Freq: Every day | ORAL | 3 refills | Status: DC

## 2019-07-31 NOTE — Patient Instructions (Addendum)
Medication Instructions:   Your physician has recommended you make the following change in your medication:   1) Start Potassium 10 mEq, 1 tablet by mouth once a day 2) Decrease Losartan to 50 mg, 1 tablet by mouth once a day 3) Stop HCTZ  *If you need a refill on your cardiac medications before your next appointment, please call your pharmacy*  Lab Work:  You will have labs drawn today: BMET/BNP  If you have labs (blood work) drawn today and your tests are completely normal, you will receive your results only by: [name] MyChart Message (if you have MyChart) OR . A paper copy in the mail If you have any lab test that is abnormal or we need to change your treatment, we will call you to review the results.  Testing/Procedures:  Your physician has requested that you have an echocardiogram. Echocardiography is a painless test that uses sound waves to create images of your heart. It provides your doctor with information about the size and shape of your heart and how well your heart's chambers and valves are working. This procedure takes approximately one hour. There are no restrictions for this procedure.  Follow-Up:  On 08/26/19 at 2:15PM with Richardson Dopp, PA-C  Other Instructions:  Hold Losartan tomorrow, 08/01/19 and resume on 08/02/19 at 50 mg. Hold Losartan 50 mg if systolic blood pressure (top number) is 90 or less.

## 2019-07-31 NOTE — Progress Notes (Signed)
Cardiology Office Note   Date:  07/31/2019   ID:  Erik [name], DOB [birth year], MRN [id number]  PCP:  [name], MD  Cardiologist: Dr. Corky Sox  Chief Complaint  Patient presents with  . Edema     History of Present Illness: Erik [name] is a 74 y.o. male who presents for follow up of LE edema, seen for Dr. Curt Bears.   Erik [name] has a history of atrial fibrillation, nonobstructive CAD, hypertension, hyperlipidemia and hypothyroidism.  In March 2016 he was diagnosed with atrial fibrillation with RVR in the setting of chest pain.  He was loaded with Tikosyn which was stopped as he went back into atrial fibrillation on 500 mcg twice daily.  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  He was recently seen by Dr. Curt Bears in follow-up and was relatively asymptomatic.  Did have some complaints of feeling weak and fatigued with mild lower extremity edema and multiple orthopedic complaints.  His PCP had prescribed prednisone therapy 10 mg daily and he had gained approximately 30 pounds since starting prednisone. His A. fib was noted to be well controlled per interrogation profile.  LE edema was felt to be secondary to higher dose of diltiazem as well as daily prednisone therefore he was given 20 mg of Lasix daily x5 days and his diltiazem was decreased to 180 mg p.o. daily.  Per chart review, patient called 07/08/2019 with improvement in LE edema. Unfortunately, there seems to be unclear communication as to whether he was continued on Lasix or if this was stopped.   Today he is here with his wife.  They report that the patient was on the 5-day Lasix course of 20 mg p.o. daily with good symptom relief however after stopping the Lasix, the lower extremity edema returned.  He then called the office and received a 3-day supply of Lasix again with symptom relief but then has began to return.  Does  have some dizziness today as well.  BP appears to be 90/60.  We discussed stopping his HCTZ in the setting of Lasix therapy and we will also hold his losartan tomorrow and resume at half dose.  Discussed this with patient and wife.  They are to also hold if his SBP is less than 90.  Previous echocardiogram from 2017 shows normal systolic function however did have G1 DD and no valvular disease therefore we will repeat echocardiogram.  He denies chest pain, orthopnea, syncope.  Does have shortness of breath with ambulation however reports it is difficult to discern given his dramatic weight gain after being placed on prednisone therapy.  He is currently off of prednisone per PCP direction.  Past Medical History:  Diagnosis Date  . Anxiety   . Arthritis   . Atypical chest pain 06/26/2015  . BPH with urinary obstruction   . Carotid disease, bilateral ([place])    a. 09/2014 Carotid U/S: 1-39% bilat ICA stenosis.  . Chronic lower back pain   . CKD (chronic kidney disease), stage III    pt. denies  . Diverticulitis 12/2016  . Diverticulosis   . Dyspnea   . Gout    "couple days/year" (05/17/2017)  . History of kidney stones   . Hyperlipidemia   . Hypertension   . Hypertensive heart disease   . Hypothyroidism   . Internal hemorrhoids   . Long QT interval 06/26/2015  . Nephrolithiasis   . Neuropathy  07/30/2017  . Non-obstructive CAD    a. 07/2002 Cath: LM 20, LAD 41m/d, LCX 50-44m, OM1 68m, RCA 55m, EF 60%; b. 05/2014 MV: low risk w/ small sized, mild intensity rev defect in apical/inferior/infsept area, nl EF->Med Rx.  . Panic attack   . Paroxysmal atrial fibrillation ([place])    a. Dx 04/2014; b. 05/2014 Echo: Ef 60-65%, no rwma, triv MR/TR, nl RV;  c. CHA2DS2VASc = 3-->was on eliquis, switched to xarelto 04/2015 2/2 cost; d. 05/2015 Tikosyn loaded w/ conversion to AF; e. 06/2015 QTc prolongation and bradycardia->tikosyn d/c'd, PPM placed, Amio started; f. 07/01/2015 In Aflutter @ clinic f/u.  [name] Paroxysmal atrial  flutter ([place])    a. 06/2015 noted to be in rapid Aflutter in Afib clinic-->amio load continued.  . Persistent atrial fibrillation ([place]) 06/21/2015  . Presence of permanent cardiac pacemaker    St. Jude  . Prolapsed internal hemorrhoids, grade 2-3 10/19/2016   LL Gr 3 and RP/RA Gr 2 LL banded 10/19/2016   . RLL pneumonia 11/05/2012  . Sleep apnea    "can't tolerate mask" (05/17/2017)  . Tachy-brady syndrome ([place])    a. 06/27/2015 s/p SJM DC PPM (ser # @ [id number]).    Past Surgical History:  Procedure Laterality Date  . ATRIAL FIBRILLATION ABLATION N/A 05/17/2017   Procedure: ATRIAL FIBRILLATION ABLATION;  Surgeon: Constance Haw, MD;  Location: [facility];  Service: Cardiovascular;  Laterality: N/A;  . BACK SURGERY    . CARDIAC CATHETERIZATION  ~ 2000  . CLOSED REDUCTION HAND FRACTURE Right 1984  . COLONOSCOPY  06/27/2015   per Dr. Henrene Pastor, internal hemorrhoids and diverticulae, repeat 10 yrs  . CYSTOSCOPY WITH RETROGRADE PYELOGRAM, URETEROSCOPY AND STENT PLACEMENT Bilateral 02/14/2018   Procedure: CYSTOSCOPY WITH RETROGRADE PYELOGRAM, URETEROSCOPY AND STENT PLACEMENT;  Surgeon: Alexis Frock, MD;  Location: [facility];  Service: Urology;  Laterality: Bilateral;  . EP IMPLANTABLE DEVICE N/A 06/27/2015   Procedure: Pacemaker Implant;  Surgeon: Evans Lance, MD;  Location: [facility];  Service: Cardiovascular;  Laterality: N/A;  . FRACTURE SURGERY    . HEMORRHOID BANDING    . HOLMIUM LASER APPLICATION Bilateral 54/00/8676   Procedure: HOLMIUM LASER APPLICATION;  Surgeon: Alexis Frock, MD;  Location: [facility];  Service: Urology;  Laterality: Bilateral;  . INSERT / REPLACE / REMOVE PACEMAKER    . LACERATION REPAIR Right 1984   "hand"  . POSTERIOR LUMBAR FUSION  1998   2 lumbar discs, Dr. Ellene Route; "ray cages"     Current Outpatient Medications  Medication Sig Dispense Refill  . acetaminophen (TYLENOL) 500 MG tablet Take 1,000 mg by mouth every 6 (six) hours as needed.    [name]  BESIVANCE 0.6 % SUSP Place 1 drop into the left eye 3 (three) times daily.    [name] diltiazem (CARDIZEM CD) 180 MG 24 hr capsule Take 1 capsule (180 mg total) by mouth daily. 90 capsule 2  . diltiazem (CARDIZEM) 30 MG tablet Cardizem 30mg  -- take 1 tablet every 4 hours AS NEEDED for heart rate >100 as long as blood pressure >100. 90 tablet 1  . DUREZOL 0.05 % EMUL Place 1 drop into the left eye 3 (three) times daily.    . furosemide (LASIX) 20 MG tablet Take 1 tablet (20 mg total) by mouth daily. 30 tablet 3  . gabapentin (NEURONTIN) 600 MG tablet Take 2 tablets (1,200 mg total) by mouth 3 (three) times daily. 270 tablet 1  . ibuprofen (ADVIL,MOTRIN) 200 MG tablet Take  200 mg by mouth every 8 (eight) hours as needed for headache or moderate pain.     [name] levothyroxine (SYNTHROID) 50 MCG tablet TAKE 1 TABLET(50 MCG) BY MOUTH DAILY 90 tablet 0  . losartan (COZAAR) 50 MG tablet Take 1 tablet (50 mg total) by mouth daily. 30 tablet 3  . PROLENSA 0.07 % SOLN Place 1 drop into the left eye at bedtime.    [name] REFRESH OPTIVE ADVANCED 0.5-1-0.5 % SOLN Apply 1 drop to eye 2 (two) times daily.    . simvastatin (ZOCOR) 20 MG tablet TAKE 1 TABLET BY MOUTH EVERY DAY 90 tablet 2  . tamsulosin (FLOMAX) 0.4 MG CAPS capsule TAKE ONE CAPSULE BY MOUTH EVERY DAY 90 capsule 3  . XARELTO 20 MG TABS tablet TAKE 1 TABLET BY MOUTH DAILY WITH SUPPER 90 tablet 2  . potassium chloride (KLOR-CON) 10 MEQ tablet Take 1 tablet (10 mEq total) by mouth daily. 30 tablet 3   No current facility-administered medications for this visit.    Allergies:   Patient has no known allergies.    Social History:  The patient  reports that he quit smoking about 24 years ago. His smoking use included cigarettes. He has a 56.00 pack-year smoking history. He has never used smokeless tobacco. He reports previous alcohol use. He reports that he does not use drugs.   Family History:  The patient's family history includes Cancer in his paternal aunt;  Dementia in his mother; Diabetes in his paternal grandmother; Stroke in his father and paternal aunt.    ROS:  Please see the history of present illness.   Otherwise, review of systems are positive for none. All other systems are reviewed and negative.    PHYSICAL EXAM: VS:  Pulse 83   Ht 5\' 10"  (1.778 m)   Wt 260 lb (117.9 kg)   SpO2 97%   BMI 37.31 kg/m  , BMI Body mass index is 37.31 kg/m.    General: Well developed, well nourished, NAD Neck: Negative for carotid bruits. No JVD Lungs:Clear to ausculation bilaterally. No wheezes, rales, or rhonchi. Breathing is unlabored. Cardiovascular: RRR with S1 S2. No murmurs. 2+ BLE foot edema  Abdomen: Soft, non-tender, distended. No obvious abdominal masses. Neuro: Alert and oriented. No focal deficits. No facial asymmetry. MAE spontaneously. Psych: Responds to questions appropriately with normal affect.    EKG:  EKG is not ordered today.  Recent Labs: 01/14/2019: ALT 15; BUN 20; Creatinine, Ser 1.39; Hemoglobin 14.7; Platelets 250.0; Potassium 3.7; Sodium 142; TSH 0.91    Lipid Panel    Component Value Date/Time   CHOL 165 05/06/2019 1519   TRIG 144.0 05/06/2019 1519   TRIG 203 (HH) 12/10/2005 0839   HDL 52.40 05/06/2019 1519   CHOLHDL 3 05/06/2019 1519   VLDL 28.8 05/06/2019 1519   LDLCALC 84 05/06/2019 1519   LDLDIRECT 104.9 06/09/2009 0919     Wt Readings from Last 3 Encounters:  07/31/19 260 lb (117.9 kg)  07/06/19 265 lb (120.2 kg)  07/03/19 261 lb 6.4 oz (118.6 kg)    Other studies Reviewed: Additional studies/ records that were reviewed today include:   ASSESSMENT AND PLAN:  1. LE edema: -Patient was prescribed prednisone therapy for orthopedic complaints by his PCP and had gained approximately 30 pounds since starting steroids.  He was given 20 mg p.o. Lasix daily for x5 days by Dr. Curt Bears with close follow-up. -Diltiazem was reduced to 180 mg p.o. daily -Reports good response with above Lasix dosing.  Continues to have LE edema on exam today.  Lung fields are clear.  Reports most edema remains in ankles and abdomen.  Given this will resume Lasix 20 mg p.o. daily and stop HCTZ -Obtain BMET>>last creatinine 12/2018 at 1.39  2.  Hypotension: -Difficult to obtain BP upon entering the room.  Personally retook BP and found to be 90/60.  Patient does state he has had some orthostatic dizziness.  Given this we will stop HCTZ as above and hold losartan tomorrow.  He may resume losartan at 50 mg p.o. daily on 06/02/2019.  Also discussed holding antihypertensives if SBP less than 90 or symptomatic -Close follow-up  2.  Persistent atrial fibrillation: -s/p ablation 04/27/2017 -Currently on Xarelto and diltiazem CHA2DS2VASc = 2 -Per last device interrogation, atrial fibrillation less than 1%  3.  Hypertension: -Hypotensive today, 90/60 -Diltiazem decreased to 180 mg p.o. daily at last OV with Dr. Curt Bears -We will stop HCTZ and hold losartan tomorrow and resume at 50 mg p.o. daily>> hold if SBP less than 90 -Encouraged to monitor BP closely as well as orthostatic symptoms  4.  CKD stage II: -Last creatinine, 1.39 on 01/14/2019 with a baseline which appears to be in the 1.2-1.5 range -Lab work today  5.  Carotid artery disease: -Bilateral ICA 1-39% per last carotid ultrasound in 2016 -Needs follow-up at next OV  6.  Hyperlipidemia: -Last LDL, 84 on 05/06/2019 -Continue statin  7.  Orthopedic concerns: -Patient reports he used to have orthopedic surgery next month however would hold off for now given the above issues until more stable  Current medicines are reviewed at length with the patient today.  The patient does not have concerns regarding medicines.  The following changes have been made:  Stop HCTZ, hold losartan tomorrow and can resume on 08/02/19 if SBP greater than 90. If BP stable, resume at 50mg  PO QD.   Labs/ tests ordered today include: BMET, BNP  Orders Placed This Encounter    Procedures  . Basic metabolic panel  . Pro b natriuretic peptide (BNP)  . ECHOCARDIOGRAM COMPLETE    Disposition:   FU with APP in 3 weeks  Signed, Kathyrn Drown, NP  07/31/2019 5:07 PM    [facility] [address], [place], [place]  [address] Phone: [phone]; Fax: [phone]

## 2019-08-01 LAB — BASIC METABOLIC PANEL
BUN/Creatinine Ratio: 11 (ref 10–24)
BUN: 20 mg/dL (ref 8–27)
CO2: 27 mmol/L (ref 20–29)
Calcium: 10.9 mg/dL — ABNORMAL HIGH (ref 8.6–10.2)
Chloride: 101 mmol/L (ref 96–106)
Creatinine, Ser: 1.82 mg/dL — ABNORMAL HIGH (ref 0.76–1.27)
GFR calc Af Amer: 42 mL/min/{1.73_m2} — ABNORMAL LOW (ref >59)
GFR calc non Af Amer: 36 mL/min/{1.73_m2} — ABNORMAL LOW (ref >59)
Glucose: 100 mg/dL — ABNORMAL HIGH (ref 65–99)
Potassium: 3.6 mmol/L (ref 3.5–5.2)
Sodium: 144 mmol/L (ref 134–144)

## 2019-08-01 LAB — PRO B NATRIURETIC PEPTIDE: NT-Pro BNP: 80 pg/mL (ref 0–376)

## 2019-08-03 DIAGNOSIS — H2512 Age-related nuclear cataract, left eye: Secondary | ICD-10-CM | POA: Diagnosis not present

## 2019-08-03 HISTORY — PX: EYE SURGERY: SHX253

## 2019-08-04 DIAGNOSIS — H2511 Age-related nuclear cataract, right eye: Secondary | ICD-10-CM | POA: Diagnosis not present

## 2019-08-05 ENCOUNTER — Telehealth: Payer: Self-pay | Admitting: Cardiology

## 2019-08-05 DIAGNOSIS — R609 Edema, unspecified: Secondary | ICD-10-CM

## 2019-08-05 DIAGNOSIS — Z79899 Other long term (current) drug therapy: Secondary | ICD-10-CM

## 2019-08-05 DIAGNOSIS — R0602 Shortness of breath: Secondary | ICD-10-CM

## 2019-08-05 LAB — CUP PACEART INCLINIC DEVICE CHECK
Date Time Interrogation Session: 20210504125717
Implantable Lead Implant Date: 20170501
Implantable Lead Implant Date: 20170501
Implantable Lead Location: 753859
Implantable Lead Location: 753860
Implantable Pulse Generator Implant Date: 20170501
Lead Channel Pacing Threshold Amplitude: 0.75 V
Lead Channel Pacing Threshold Amplitude: 1 V
Lead Channel Pacing Threshold Pulse Width: 0.5 ms
Lead Channel Pacing Threshold Pulse Width: 0.5 ms
Lead Channel Sensing Intrinsic Amplitude: 12 mV
Lead Channel Sensing Intrinsic Amplitude: 3.6 mV
Pulse Gen Model: 2272
Pulse Gen Serial Number: 7897836

## 2019-08-05 NOTE — Telephone Encounter (Signed)
Wife calls in reporting that pts feet seem to be worse rather than better. "They were down to 2+ but think they are back up to 3+ now".  Recently seen by APP on 6/4 who made some changes - Losartan reduced to 50 mg & HCTZ stopped. SBP, the past 5 mornings, prior to morning medications have ranged 130-135.  Wife made aware that Cr on 6/4 elevated at 1.82. Last Cr, last November, was normal. Aware that I will forward to Dr. Curt Bears for advisement and be in touch w/ recommendation.

## 2019-08-05 NOTE — Telephone Encounter (Signed)
New Message  Pt wife is calling to speak with dr. Curt Bears or nurse about pt's medication and swelling. Please call back to discuss

## 2019-08-06 NOTE — Telephone Encounter (Signed)
Advised to increase Lasix to 40 mg x 3 days, then return to normal dosing of 20 mg daily - per Dr. Curt Bears. BMET in [facility] office next Monday/Tuesday. Wife agreeable to plan.

## 2019-08-07 ENCOUNTER — Encounter: Payer: Medicare Other | Admitting: Internal Medicine

## 2019-08-10 DIAGNOSIS — R609 Edema, unspecified: Secondary | ICD-10-CM | POA: Diagnosis not present

## 2019-08-10 DIAGNOSIS — Z79899 Other long term (current) drug therapy: Secondary | ICD-10-CM | POA: Diagnosis not present

## 2019-08-11 LAB — BASIC METABOLIC PANEL
BUN/Creatinine Ratio: 12 (ref 10–24)
BUN: 15 mg/dL (ref 8–27)
CO2: 22 mmol/L (ref 20–29)
Calcium: 9.7 mg/dL (ref 8.6–10.2)
Chloride: 107 mmol/L — ABNORMAL HIGH (ref 96–106)
Creatinine, Ser: 1.3 mg/dL — ABNORMAL HIGH (ref 0.76–1.27)
GFR calc Af Amer: 63 mL/min/{1.73_m2} (ref >59)
GFR calc non Af Amer: 55 mL/min/{1.73_m2} — ABNORMAL LOW (ref >59)
Glucose: 95 mg/dL (ref 65–99)
Potassium: 4.1 mmol/L (ref 3.5–5.2)
Sodium: 145 mmol/L — ABNORMAL HIGH (ref 134–144)

## 2019-08-14 ENCOUNTER — Telehealth: Payer: Self-pay | Admitting: Cardiology

## 2019-08-14 NOTE — Telephone Encounter (Signed)
lmtcb

## 2019-08-14 NOTE — Telephone Encounter (Signed)
-----   Message from Will [name], MD sent at 08/11/2019 11:55 AM EDT ----- Creatinine improving with diuresis.  Potassium has remained stable.  We will need to check and ensure that lower extremity edema has improved.

## 2019-08-14 NOTE — Telephone Encounter (Signed)
   [facility] Pre-operative Risk Assessment    HEARTCARE STAFF: - Please ensure there is not already an duplicate clearance open for this procedure. - Under Visit Info/Reason for Call, type in Other and utilize the format Clearance MM/DD/YY or Clearance TBD. Do not use dashes or single digits. - If request is for dental extraction, please clarify the # of teeth to be extracted.  Request for surgical clearance:  1. What type of surgery is being performed? Left Hip Arthroplasty   2. When is this surgery scheduled? 09/01/19  3. What type of clearance is required (medical clearance vs. Pharmacy clearance to hold med vs. Both)? Both   4. Are there any medications that need to be held prior to surgery and how long? TBD by Dr. Curt Bears   5. Practice name and name of physician performing surgery? [facility], Dr. Melrose Nakayama  6. What is the office phone number? [phone]   7.   What is the office fax number? [phone]  8.   Anesthesia type (None, local, MAC, general) ? Spinal    Trilby Drummer 08/14/2019, 10:39 AM  _________________________________________________________________   (provider comments below)

## 2019-08-14 NOTE — Telephone Encounter (Signed)
Patient with diagnosis of ATRIAL FIBRILLATION on  For XARELTO anticoagulation.    Procedure: L HIP ARTHROPLASTY Date of procedure: 09/01/2019  CHADS2-VASc score of  2 (HTN, AGE)  CrCl >50ML/MIN   Per office protocol, patient can hold XAREOLTO for 3 days prior to procedure.    For orthopedic procedures please be sure to resume therapeutic (not prophylactic) dosing.

## 2019-08-14 NOTE — Telephone Encounter (Signed)
Pre op needed has been added to appt notes for PA appt 08/26/19. I will forward clearance notes to PA for appt. I will send FYI to surgeon Dr. Melrose Nakayama. I will remove from the pre op call back pool.

## 2019-08-14 NOTE — Telephone Encounter (Signed)
Erik [name] continues to struggle with lower extremity edema. No new dyspnea or chest pain. He will have an echo next week and follow up with Richardson Dopp on 08/26/19. I will ask Nicki Reaper to review echo and re-evaluate for preop clearance at that time.   I will add to appt notes and remove from preop pool.

## 2019-08-14 NOTE — Telephone Encounter (Signed)
Can you please comment on xarelto?

## 2019-08-17 DIAGNOSIS — H2511 Age-related nuclear cataract, right eye: Secondary | ICD-10-CM | POA: Diagnosis not present

## 2019-08-17 HISTORY — PX: EYE SURGERY: SHX253

## 2019-08-18 ENCOUNTER — Telehealth: Payer: Self-pay | Admitting: Family Medicine

## 2019-08-18 ENCOUNTER — Telehealth: Payer: Self-pay | Admitting: Cardiology

## 2019-08-18 MED ORDER — GABAPENTIN 600 MG PO TABS: 600.0000 mg | ORAL_TABLET | Freq: Three times a day (TID) | ORAL | 1 refills | Status: DC

## 2019-08-18 NOTE — Telephone Encounter (Signed)
Spoke with the patients wife. She is aware of the medication changes and that a new Rx has been sent in to the pharmacy. Nothing further needed.

## 2019-08-18 NOTE — Telephone Encounter (Signed)
We will decrease the dose back to one pill TID. I sent in refills for this

## 2019-08-18 NOTE — Telephone Encounter (Signed)
Informed patient of results and verbal understanding expressed. Pt edema improved -- Dr. Sarajane Jews reduced his gabapentin which helped. Advised to call office if edema begins again Wife verbalized understanding.

## 2019-08-18 NOTE — Telephone Encounter (Signed)
     Pt is looking for Erik [name] to get labs results

## 2019-08-18 NOTE — Telephone Encounter (Signed)
Pts wife called in stating that the pts shingles has cleared up and they wanting to know if pt should change his dosage of his gabapentin back to the original dosage and she also stated that they called the pharmacy and b/c pt did not have any more of the Rx and needed a new Rx for gabapentin.  Pts spouse is aware that a Rx was sent in on 07/06/2019 when he was in the office.  They would like to have a call back with info.

## 2019-08-19 ENCOUNTER — Encounter: Payer: Self-pay | Admitting: Cardiology

## 2019-08-19 NOTE — Patient Instructions (Addendum)
DUE TO COVID-19 ONLY ONE VISITOR IS ALLOWED TO COME WITH YOU AND STAY IN THE WAITING ROOM ONLY DURING PRE OP AND PROCEDURE DAY OF SURGERY. THE 1 VISITOR MAY VISIT WITH YOU AFTER SURGERY IN YOUR PRIVATE ROOM DURING VISITING HOURS ONLY!  YOU NEED TO HAVE A COVID 19 TEST ON: 08/28/19  @ 3:00 pm , THIS TEST MUST BE DONE BEFORE SURGERY, COME  [address], [address] [place] , [address].  ([place]) ONCE YOUR COVID TEST IS COMPLETED, PLEASE BEGIN THE QUARANTINE INSTRUCTIONS AS OUTLINED IN YOUR HANDOUT.                Erik [name]    Your procedure is scheduled on: 09/01/19   Report to [facility] Main  Entrance   Report to short stay at: 5:30  AM     Call this number if you have problems the morning of surgery [phone]    Remember:   NO SOLID FOOD AFTER MIDNIGHT THE NIGHT PRIOR TO SURGERY. NOTHING BY MOUTH EXCEPT CLEAR LIQUIDS UNTIL: 4:30 am . PLEASE FINISH ENSURE DRINK PER SURGEON ORDER  WHICH NEEDS TO BE COMPLETED AT: 4:30 am .   CLEAR LIQUID DIET   Foods Allowed                                                                     Foods Excluded  Coffee and tea, regular and decaf                             liquids that you cannot  Plain Jell-O any favor except red or purple                                           see through such as: Fruit ices (not with fruit pulp)                                     milk, soups, orange juice  Iced Popsicles                                    All solid food Carbonated beverages, regular and diet                                    Cranberry, grape and apple juices Sports drinks like Gatorade Lightly seasoned clear broth or consume(fat free) Sugar, honey syrup  Sample Menu Breakfast                                Lunch                                     Supper Cranberry juice  Beef broth                            Chicken broth Jell-O                                     Grape juice                            Apple juice Coffee or tea                        Jell-O                                      Popsicle                                                Coffee or tea                        Coffee or tea  _____________________________________________________________________  BRUSH YOUR TEETH MORNING OF SURGERY AND RINSE YOUR MOUTH OUT, NO CHEWING GUM CANDY OR MINTS.     Take these medicines the morning of surgery with A SIP OF WATER: Diltiazem,gabapentin,levothyroxine,tamsulosin.Use eye drops as usual.                                 You may not have any metal on your body including hair pins and              piercings  Do not wear jewelry, lotions, powders or perfumes, deodorant             Men may shave face and neck.   Do not bring valuables to the hospital. [place].  Contacts, dentures or bridgework may not be worn into surgery.  Leave suitcase in the car. After surgery it may be brought to your room.     Patients discharged the day of surgery will not be allowed to drive home. IF YOU ARE HAVING SURGERY AND GOING HOME THE SAME DAY, YOU MUST HAVE AN ADULT TO DRIVE YOU HOME AND BE WITH YOU FOR 24 HOURS. YOU MAY GO HOME BY TAXI OR UBER OR ORTHERWISE, BUT AN ADULT MUST ACCOMPANY YOU HOME AND STAY WITH YOU FOR 24 HOURS.  Name and phone number of your driver:  Special Instructions: N/A              Please read over the following fact sheets you were given: _____________________________________________________________________  [facility] - Preparing for Surgery Before surgery, you can play an important role.  Because skin is not sterile, your skin needs to be as free of germs as possible.  You can reduce the number of germs on your skin by washing with CHG (chlorahexidine gluconate) soap before surgery.  CHG is an antiseptic cleaner which kills germs and bonds with the skin to continue killing germs even after  washing. Please DO NOT  use if you have an allergy to CHG or antibacterial soaps.  If your skin becomes reddened/irritated stop using the CHG and inform your nurse when you arrive at Short Stay. Do not shave (including legs and underarms) for at least 48 hours prior to the first CHG shower.  You may shave your face/neck. Please follow these instructions carefully:  1.  Shower with CHG Soap the night before surgery and the  morning of Surgery.  2.  If you choose to wash your hair, wash your hair first as usual with your  normal  shampoo.  3.  After you shampoo, rinse your hair and body thoroughly to remove the  shampoo.                           4.  Use CHG as you would any other liquid soap.  You can apply chg directly  to the skin and wash                       Gently with a scrungie or clean washcloth.  5.  Apply the CHG Soap to your body ONLY FROM THE NECK DOWN.   Do not use on face/ open                           Wound or open sores. Avoid contact with eyes, ears mouth and genitals (private parts).                       Wash face,  Genitals (private parts) with your normal soap.             6.  Wash thoroughly, paying special attention to the area where your surgery  will be performed.  7.  Thoroughly rinse your body with warm water from the neck down.  8.  DO NOT shower/wash with your normal soap after using and rinsing off  the CHG Soap.                9.  Pat yourself dry with a clean towel.            10.  Wear clean pajamas.            11.  Place clean sheets on your bed the night of your first shower and do not  sleep with pets. Day of Surgery : Do not apply any lotions/deodorants the morning of surgery.  Please wear clean clothes to the hospital/surgery center.  FAILURE TO FOLLOW THESE INSTRUCTIONS MAY RESULT IN THE CANCELLATION OF YOUR SURGERY PATIENT SIGNATURE_________________________________  NURSE  SIGNATURE__________________________________  ________________________________________________________________________   Adam Phenix  An incentive spirometer is a tool that can help keep your lungs clear and active. This tool measures how well you are filling your lungs with each breath. Taking long deep breaths may help reverse or decrease the chance of developing breathing (pulmonary) problems (especially infection) following:  A long period of time when you are unable to move or be active. BEFORE THE PROCEDURE   If the spirometer includes an indicator to show your best effort, your nurse or respiratory therapist will set it to a desired goal.  If possible, sit up straight or lean slightly forward. Try not to slouch.  Hold the incentive spirometer in an upright position. INSTRUCTIONS FOR USE  1. Sit on the edge of  your bed if possible, or sit up as far as you can in bed or on a chair. 2. Hold the incentive spirometer in an upright position. 3. Breathe out normally. 4. Place the mouthpiece in your mouth and seal your lips tightly around it. 5. Breathe in slowly and as deeply as possible, raising the piston or the ball toward the top of the column. 6. Hold your breath for 3-5 seconds or for as long as possible. Allow the piston or ball to fall to the bottom of the column. 7. Remove the mouthpiece from your mouth and breathe out normally. 8. Rest for a few seconds and repeat Steps 1 through 7 at least 10 times every 1-2 hours when you are awake. Take your time and take a few normal breaths between deep breaths. 9. The spirometer may include an indicator to show your best effort. Use the indicator as a goal to work toward during each repetition. 10. After each set of 10 deep breaths, practice coughing to be sure your lungs are clear. If you have an incision (the cut made at the time of surgery), support your incision when coughing by placing a pillow or rolled up towels firmly  against it. Once you are able to get out of bed, walk around indoors and cough well. You may stop using the incentive spirometer when instructed by your caregiver.  RISKS AND COMPLICATIONS  Take your time so you do not get dizzy or light-headed.  If you are in pain, you may need to take or ask for pain medication before doing incentive spirometry. It is harder to take a deep breath if you are having pain. AFTER USE  Rest and breathe slowly and easily.  It can be helpful to keep track of a log of your progress. Your caregiver can provide you with a simple table to help with this. If you are using the spirometer at home, follow these instructions: [place] IF:   You are having difficultly using the spirometer.  You have trouble using the spirometer as often as instructed.  Your pain medication is not giving enough relief while using the spirometer.  You develop fever of 100.5 F (38.1 C) or higher. SEEK IMMEDIATE MEDICAL CARE IF:   You cough up bloody sputum that had not been present before.  You develop fever of 102 F (38.9 C) or greater.  You develop worsening pain at or near the incision site. MAKE SURE YOU:   Understand these instructions.  Will watch your condition.  Will get help right away if you are not doing well or get worse. Document Released: 06/25/2006 Document Revised: 05/07/2011 Document Reviewed: 08/26/2006 [facility] Patient Information 2014 [place], [place].   ________________________________________________________________________

## 2019-08-19 NOTE — Progress Notes (Signed)
PERIOPERATIVE PRESCRIPTION FOR IMPLANTED CARDIAC DEVICE PROGRAMMING  Patient Information: Name:  Erik [name]  DOB:  11-01-1945  MRN:  [id number]    Planned Procedure: Left Total hip arthroplasty  Surgeon: Dr. Melrose Nakayama  Date of Procedure: 09/01/19  Cautery will be used.  Position during surgery:    Please send documentation back to:  [facility] (Fax # [phone])   Joline Maxcy, RN  08/19/2019 4:16 PM   Device Information:  Clinic EP Physician:  Allegra Lai, MD   Device Type:  Pacemaker Manufacturer and Phone #:  St. Jude/Abbott: [phone] Pacemaker Dependent?:  No. Date of Last Device Check:  06/30/2019 Normal Device Function?:  Yes.    Electrophysiologist's Recommendations:   Have magnet available.  Provide continuous ECG monitoring when magnet is used or reprogramming is to be performed.   Procedure should not interfere with device function.  No device programming or magnet placement needed.  Per Device Clinic Standing Orders, York Ram, RN  5:08 PM 08/19/2019

## 2019-08-20 ENCOUNTER — Telehealth: Payer: Self-pay | Admitting: Cardiology

## 2019-08-20 ENCOUNTER — Other Ambulatory Visit: Payer: Self-pay

## 2019-08-20 ENCOUNTER — Encounter (HOSPITAL_COMMUNITY)
Admission: RE | Admit: 2019-08-20 | Discharge: 2019-08-20 | Disposition: A | Discharge status: Home / Self Care | Payer: Medicare Other | Source: Ambulatory Visit | Attending: Orthopaedic Surgery | Admitting: Orthopaedic Surgery

## 2019-08-20 ENCOUNTER — Telehealth: Payer: Self-pay | Admitting: Family Medicine

## 2019-08-20 ENCOUNTER — Encounter (HOSPITAL_COMMUNITY): Payer: Self-pay

## 2019-08-20 DIAGNOSIS — R609 Edema, unspecified: Secondary | ICD-10-CM | POA: Diagnosis present

## 2019-08-20 DIAGNOSIS — Z01818 Encounter for other preprocedural examination: Secondary | ICD-10-CM | POA: Diagnosis present

## 2019-08-20 DIAGNOSIS — R0602 Shortness of breath: Secondary | ICD-10-CM | POA: Diagnosis present

## 2019-08-20 NOTE — Progress Notes (Signed)
Orders for device request and e-mail to [place] rep. Have been placed in the chart.

## 2019-08-20 NOTE — Telephone Encounter (Signed)
Spoke with the the patients wife. She stated that [facility] needs permission to fill this early due to the temporary increase in medication.   Spoke with the pharmacy and permission to fill early has been given. Patients wife is aware.

## 2019-08-20 NOTE — Telephone Encounter (Signed)
Patient is requesting to speak with Erik [name] in regards to patient's lasix. She did not want to discuss further with me and stated twice she just wants Erik [name] to call her.

## 2019-08-20 NOTE — Progress Notes (Signed)
COVID Vaccine Completed: Date COVID Vaccine completed: COVID vaccine manufacturer: [employer]   PCP - Dr. [name] Cardiologist - Dr. Quay Burow. Appt. 6/30 for clearance.  Chest x-ray -  EKG -  Stress Test -  ECHO - schedule for 6/30 Cardiac Cath -   Sleep Study -  CPAP -   Fasting Blood Sugar -  Checks Blood Sugar _____ times a day  Blood Thinner Instructions:Waitting on instructions on Xarelto on 6/30 Aspirin Instructions: Last Dose:  Anesthesia review: Hx: OSA(no CPAP),AFIB,CKD III,CAD,Pacemaker.(orders requested)  Patient denies shortness of breath, fever, cough and chest pain at PAT appointment   Patient verbalized understanding of instructions that were given to them at the PAT appointment. Patient was also instructed that they will need to review over the PAT instructions again at home before surgery.

## 2019-08-20 NOTE — Telephone Encounter (Signed)
Erik [name],pt's spouse on DPR, stated that [facility] needs something that stated the pt had a temporally increase on the dosage for gabapentin because the pt is due for a refill but the pharmacy stated it is too early. She is aware that Erik [name] is out of the office until Tuesday and would like a call if this can be done today.   Erik can be reached at [place], [place] - [address] Phone:  [phone]  Fax:  [phone]

## 2019-08-21 ENCOUNTER — Ambulatory Visit (HOSPITAL_COMMUNITY)
Admission: RE | Admit: 2019-08-21 | Discharge: 2019-08-21 | Disposition: A | Discharge status: Home / Self Care | Payer: Medicare Other | Source: Ambulatory Visit | Attending: Orthopaedic Surgery | Admitting: Orthopaedic Surgery

## 2019-08-21 ENCOUNTER — Encounter (HOSPITAL_COMMUNITY)
Admission: RE | Admit: 2019-08-21 | Discharge: 2019-08-21 | Disposition: A | Discharge status: Home / Self Care | Payer: Medicare Other | Source: Ambulatory Visit | Attending: Orthopaedic Surgery | Admitting: Orthopaedic Surgery

## 2019-08-21 ENCOUNTER — Ambulatory Visit (HOSPITAL_BASED_OUTPATIENT_CLINIC_OR_DEPARTMENT_OTHER): Payer: Medicare Other

## 2019-08-21 DIAGNOSIS — R0602 Shortness of breath: Secondary | ICD-10-CM

## 2019-08-21 DIAGNOSIS — Z01818 Encounter for other preprocedural examination: Secondary | ICD-10-CM | POA: Diagnosis not present

## 2019-08-21 DIAGNOSIS — R609 Edema, unspecified: Secondary | ICD-10-CM | POA: Diagnosis not present

## 2019-08-21 LAB — BASIC METABOLIC PANEL WITH GFR
Anion gap: 10 (ref 5–15)
BUN: 17 mg/dL (ref 8–23)
CO2: 28 mmol/L (ref 22–32)
Calcium: 9.8 mg/dL (ref 8.9–10.3)
Chloride: 105 mmol/L (ref 98–111)
Creatinine, Ser: 1.32 mg/dL — ABNORMAL HIGH (ref 0.61–1.24)
GFR calc Af Amer: >60 mL/min
GFR calc non Af Amer: 54 mL/min — ABNORMAL LOW
Glucose, Bld: 99 mg/dL (ref 70–99)
Potassium: 3.9 mmol/L (ref 3.5–5.1)
Sodium: 143 mmol/L (ref 135–145)

## 2019-08-21 LAB — CBC WITH DIFFERENTIAL/PLATELET
Abs Immature Granulocytes: 0.02 10*3/uL (ref 0.00–0.07)
Basophils Absolute: 0.1 10*3/uL (ref 0.0–0.1)
Basophils Relative: 1 %
Eosinophils Absolute: 0.2 10*3/uL (ref 0.0–0.5)
Eosinophils Relative: 3 %
HCT: 43.2 % (ref 39.0–52.0)
Hemoglobin: 14.1 g/dL (ref 13.0–17.0)
Immature Granulocytes: 0 %
Lymphocytes Relative: 23 %
Lymphs Abs: 1.6 10*3/uL (ref 0.7–4.0)
MCH: 30.3 pg (ref 26.0–34.0)
MCHC: 32.6 g/dL (ref 30.0–36.0)
MCV: 92.9 fL (ref 80.0–100.0)
Monocytes Absolute: 0.8 10*3/uL (ref 0.1–1.0)
Monocytes Relative: 11 %
Neutro Abs: 4.3 10*3/uL (ref 1.7–7.7)
Neutrophils Relative %: 62 %
Platelets: 260 10*3/uL (ref 150–400)
RBC: 4.65 MIL/uL (ref 4.22–5.81)
RDW: 13.2 % (ref 11.5–15.5)
WBC: 7 10*3/uL (ref 4.0–10.5)
nRBC: 0 % (ref 0.0–0.2)

## 2019-08-21 LAB — TYPE AND SCREEN
ABO/RH(D): O POS
Antibody Screen: NEGATIVE

## 2019-08-21 LAB — URINALYSIS, ROUTINE W REFLEX MICROSCOPIC
Bilirubin Urine: NEGATIVE
Glucose, UA: NEGATIVE mg/dL
Hgb urine dipstick: NEGATIVE
Ketones, ur: NEGATIVE mg/dL
Leukocytes,Ua: NEGATIVE
Nitrite: NEGATIVE
Protein, ur: NEGATIVE mg/dL
Specific Gravity, Urine: 1.016 (ref 1.005–1.030)
pH: 5 (ref 5.0–8.0)

## 2019-08-21 LAB — PROTIME-INR
INR: 1.4 — ABNORMAL HIGH (ref 0.8–1.2)
Prothrombin Time: 16.9 seconds — ABNORMAL HIGH (ref 11.4–15.2)

## 2019-08-21 LAB — APTT: aPTT: 38 seconds — ABNORMAL HIGH (ref 24–36)

## 2019-08-21 LAB — SURGICAL PCR SCREEN
MRSA, PCR: NEGATIVE
Staphylococcus aureus: NEGATIVE

## 2019-08-21 LAB — ABO/RH: ABO/RH(D): O POS

## 2019-08-21 NOTE — Telephone Encounter (Signed)
Advised ok for pt to increase Lasix to 40 mg x 3 days per Dr. Curt Bears. Informed that [facility] was going to review echo results next week. Advised to discuss if pt needs to re-establish w/ gen card at next weeks OV w/ Richardson Dopp, PA.  Wife verbalized understanding and agreeable to plan.

## 2019-08-21 NOTE — Telephone Encounter (Signed)
Wife reports pt feet are swelling again, "yesterday they were quite puffy". Aware that I will send to [facility] for advisement of possible increase Lasix to 40 mg x 3 days. Aware that pt may need to get back in w/ general cardiologist (last seen by [name] 2017, non-obstructive CAD/HTN/HLD/carotid hx) for continued pedal edema issue & overdue follow up.  Wife reports that they were told by Dr. [name] once PPM was placed that Dr. Curt Bears would be his doctor (Informed that I am not sure if that is correct, but will await echo result (scheduled for today) and Cam[facility] recommendation. They are scheduled to see Richardson Dopp next Wednesday for surgical clearance.  Informed that may be a great time to discuss if they need to re-establish w/ gen card.  Wife agreeable. Aware I will call back once advised.

## 2019-08-24 ENCOUNTER — Telehealth: Payer: Self-pay | Admitting: Family Medicine

## 2019-08-24 NOTE — Progress Notes (Signed)
Lab results: PT: 16.9 INR: 1.4 PTT: 38

## 2019-08-24 NOTE — Progress Notes (Signed)
  Chronic Care Management   Outreach Note  08/24/2019 Name: Erik [name] MRN: [id number] DOB: 30-Sep-1945  Referred by: [name], MD Reason for referral : No chief complaint on file.   An unsuccessful telephone outreach was attempted today. The patient was referred to the pharmacist for assistance with care management and care coordination.   Follow Up Plan:   [place]

## 2019-08-25 DIAGNOSIS — L814 Other melanin hyperpigmentation: Secondary | ICD-10-CM | POA: Diagnosis not present

## 2019-08-25 DIAGNOSIS — D1801 Hemangioma of skin and subcutaneous tissue: Secondary | ICD-10-CM | POA: Diagnosis not present

## 2019-08-25 DIAGNOSIS — D485 Neoplasm of uncertain behavior of skin: Secondary | ICD-10-CM | POA: Diagnosis not present

## 2019-08-25 DIAGNOSIS — C44329 Squamous cell carcinoma of skin of other parts of face: Secondary | ICD-10-CM | POA: Diagnosis not present

## 2019-08-25 DIAGNOSIS — D229 Melanocytic nevi, unspecified: Secondary | ICD-10-CM | POA: Diagnosis not present

## 2019-08-25 DIAGNOSIS — L821 Other seborrheic keratosis: Secondary | ICD-10-CM | POA: Diagnosis not present

## 2019-08-25 NOTE — Progress Notes (Signed)
Anesthesia Chart Review:   Case: [id number] Date/Time: 09/01/19 0715   Procedure: LEFT TOTAL HIP ARTHROPLASTY ANTERIOR APPROACH (Left Hip)   Anesthesia type: Spinal   Pre-op diagnosis: LEFT HIP DEGENERATIVE JOINT DISEASE   [facility] ROOM 05 / [facility]   Surgeons: Melrose Nakayama, MD      DISCUSSION:  Pt is a 74 year old with hx CAD, atrial fibrillation (s/p ablation 2019), tachy-brady syndrome, pacemaker (St. Jude implanted 2017), HTN, OSA, CKD   PT 16.9, PTT 38. Pt takes xarelto. Will recheck PT/PTT day of surgery  Pt to hold xarelto 3 days before surgery  Perioperative prescription for PPM note 08/19/19 by Trena Platt:   Have magnet available.  Provide continuous ECG monitoring when magnet is used or reprogramming is to be performed.   Procedure should not interfere with device function.  No device programming or magnet placement needed.   VS: BP 138/76   Pulse 71   Temp 36.9 C (Oral)   Resp 16   Ht 5\' 10"  (1.778 m)   Wt 119.9 kg   SpO2 95%   BMI 37.92 kg/m    PROVIDERS: - PCP is [name], MD - EP cardiologist is Allegra Lai, MD. Last office visit 06/30/19 - Cardiologist is Quay Burow, MD. Evaluated 08/26/19 by Tarri Glenn, PA for ongoing peripheral edema and pre-op eval. Cleared for surgery at this appointment.    LABS:  - PT 16.9, PTT 38. Pt takes xarelto. Will recheck PT/PTT day of surgery  (all labs ordered are listed, but only abnormal results are displayed)  Labs Reviewed  APTT - Abnormal; Notable for the following components:      Result Value   aPTT 38 (*)    All other components within normal limits  BASIC METABOLIC PANEL - Abnormal; Notable for the following components:   Creatinine, Ser 1.32 (*)    GFR calc non Af Amer 54 (*)    All other components within normal limits  PROTIME-INR - Abnormal; Notable for the following components:   Prothrombin Time 16.9 (*)    INR 1.4 (*)    All other components within normal limits  SURGICAL PCR  SCREEN  CBC WITH DIFFERENTIAL/PLATELET  URINALYSIS, ROUTINE W REFLEX MICROSCOPIC  TYPE AND SCREEN  ABO/RH     IMAGES: CXR 08/22/19: Stable exam, no acute process   EKG 08/21/19: NSR   CV:  Echo 08/21/19:  1. Left ventricular ejection fraction, by estimation, is 60 to 65%. The left ventricle has normal function. The left ventricle has no regional wall motion abnormalities. There is mild asymmetric left ventricular hypertrophy. Left ventricular diastolic parameters were normal. The average left ventricular global longitudinal strain is -19.0 %.  2. Right ventricular systolic function is normal. The right ventricular size is normal.  3. The mitral valve is normal in structure. Trivial mitral valve regurgitation. No evidence of mitral stenosis.  4. The aortic valve is grossly normal. Aortic valve regurgitation is not visualized. No aortic stenosis is present.   CT cardiac morphology 05/14/17:  1. There is normal pulmonary vein drainage into the left atrium. 2. The left atrial appendage is fairly small with mixed chicken wing morphology and no thrombus. 3. The esophagus runs to the left from the left atrial midline and is in the proximity to left common pulmonary trunk. 4. Normal coronary origin. Right dominance. The study was performed without use of NTG and insufficient for plaque evaluation. However, there are significant diffuse calcifications in all three coronary arteries,  calcium score is 1757 that represents 92 percentile for age/sex. 5. Pulmonary artery is mildly dilated measuring 33 mm suggestive of pulmonary hypertension.   Past Medical History:  Diagnosis Date  . Anxiety   . Arthritis   . Atypical chest pain 06/26/2015  . BPH with urinary obstruction   . Carotid disease, bilateral ([place])    a. 09/2014 Carotid U/S: 1-39% bilat ICA stenosis.  . Chronic lower back pain   . CKD (chronic kidney disease), stage III    pt. denies  . Diverticulitis 12/2016  . Diverticulosis    . Dyspnea    at rest and exertion  . Gout    "couple days/year" (05/17/2017)  . History of kidney stones   . Hyperlipidemia   . Hypertension   . Hypertensive heart disease   . Hypothyroidism   . Internal hemorrhoids   . Long QT interval 06/26/2015  . Nephrolithiasis   . Neuropathy 07/30/2017  . Non-obstructive CAD    a. 07/2002 Cath: LM 20, LAD 26m/d, LCX 50-61m, OM1 53m, RCA 64m, EF 60%; b. 05/2014 MV: low risk w/ small sized, mild intensity rev defect in apical/inferior/infsept area, nl EF->Med Rx.  . Panic attack   . Paroxysmal atrial fibrillation ([place])    a. Dx 04/2014; b. 05/2014 Echo: Ef 60-65%, no rwma, triv MR/TR, nl RV;  c. CHA2DS2VASc = 3-->was on eliquis, switched to xarelto 04/2015 2/2 cost; d. 05/2015 Tikosyn loaded w/ conversion to AF; e. 06/2015 QTc prolongation and bradycardia->tikosyn d/c'd, PPM placed, Amio started; f. 07/01/2015 In Aflutter @ clinic f/u.  [name] Paroxysmal atrial flutter ([place])    a. 06/2015 noted to be in rapid Aflutter in Afib clinic-->amio load continued.  . Persistent atrial fibrillation ([place]) 06/21/2015  . Presence of permanent cardiac pacemaker    St. Jude  . Prolapsed internal hemorrhoids, grade 2-3 10/19/2016   LL Gr 3 and RP/RA Gr 2 LL banded 10/19/2016   . RLL pneumonia 11/05/2012  . Sleep apnea    "can't tolerate mask" (05/17/2017)  . Tachy-brady syndrome ([place])    a. 06/27/2015 s/p SJM DC PPM (ser # @ [id number]).    Past Surgical History:  Procedure Laterality Date  . ATRIAL FIBRILLATION ABLATION N/A 05/17/2017   Procedure: ATRIAL FIBRILLATION ABLATION;  Surgeon: Constance Haw, MD;  Location: [facility];  Service: Cardiovascular;  Laterality: N/A;  . BACK SURGERY    . CARDIAC CATHETERIZATION  ~ 2000  . CLOSED REDUCTION HAND FRACTURE Right 1984  . COLONOSCOPY  06/27/2015   per Dr. Henrene Pastor, internal hemorrhoids and diverticulae, repeat 10 yrs  . CYSTOSCOPY WITH RETROGRADE PYELOGRAM, URETEROSCOPY AND STENT PLACEMENT Bilateral 02/14/2018    Procedure: CYSTOSCOPY WITH RETROGRADE PYELOGRAM, URETEROSCOPY AND STENT PLACEMENT;  Surgeon: Alexis Frock, MD;  [facility];  Service: Urology;  Laterality: Bilateral;  . EP IMPLANTABLE DEVICE N/A 06/27/2015   Procedure: Pacemaker Implant;  Surgeon: Evans Lance, MD;  Location: [facility];  Service: Cardiovascular;  Laterality: N/A;  . FRACTURE SURGERY     right hand,finger  . HEMORRHOID BANDING    . HOLMIUM LASER APPLICATION Bilateral 18/56/3149   Procedure: HOLMIUM LASER APPLICATION;  Surgeon: Alexis Frock, MD;  [facility];  Service: Urology;  Laterality: Bilateral;  . INSERT / REPLACE / REMOVE PACEMAKER    . LACERATION REPAIR Right 1984   "hand"  . POSTERIOR LUMBAR FUSION  1998   2 lumbar discs, Dr. Ellene Route; "ray cages"    MEDICATIONS: . acetaminophen (TYLENOL) 500 MG  tablet  . BESIVANCE 0.6 % SUSP  . diltiazem (CARDIZEM CD) 180 MG 24 hr capsule  . diltiazem (CARDIZEM) 30 MG tablet  . DUREZOL 0.05 % EMUL  . furosemide (LASIX) 20 MG tablet  . gabapentin (NEURONTIN) 600 MG tablet  . levothyroxine (SYNTHROID) 50 MCG tablet  . losartan (COZAAR) 50 MG tablet  . potassium chloride (KLOR-CON) 10 MEQ tablet  . PROLENSA 0.07 % SOLN  . REFRESH OPTIVE ADVANCED 0.5-1-0.5 % SOLN  . simvastatin (ZOCOR) 20 MG tablet  . tamsulosin (FLOMAX) 0.4 MG CAPS capsule  . XARELTO 20 MG TABS tablet   No current facility-administered medications for this encounter.   - Pt to hold xarelto 3 days before surgery   If labs acceptable day of surgery, I anticipate pt can proceed with surgery as scheduled.  Willeen Cass, FNP-BC [facility] Short Stay Surgical Center/Anesthesiology Phone: [phone] 08/27/2019 9:51 AM

## 2019-08-26 ENCOUNTER — Other Ambulatory Visit: Payer: Self-pay | Admitting: Family Medicine

## 2019-08-26 ENCOUNTER — Encounter: Payer: Self-pay | Admitting: Physician Assistant

## 2019-08-26 ENCOUNTER — Ambulatory Visit (INDEPENDENT_AMBULATORY_CARE_PROVIDER_SITE_OTHER): Payer: Medicare Other | Admitting: Medical

## 2019-08-26 ENCOUNTER — Other Ambulatory Visit: Payer: Self-pay

## 2019-08-26 VITALS — BP 116/62 | HR 84 | Ht 70.0 in | Wt 264.2 lb

## 2019-08-26 DIAGNOSIS — Z01810 Encounter for preprocedural cardiovascular examination: Secondary | ICD-10-CM | POA: Diagnosis not present

## 2019-08-26 DIAGNOSIS — I4819 Other persistent atrial fibrillation: Secondary | ICD-10-CM

## 2019-08-26 DIAGNOSIS — I10 Essential (primary) hypertension: Secondary | ICD-10-CM

## 2019-08-26 DIAGNOSIS — R600 Localized edema: Secondary | ICD-10-CM

## 2019-08-26 DIAGNOSIS — E785 Hyperlipidemia, unspecified: Secondary | ICD-10-CM | POA: Diagnosis not present

## 2019-08-26 DIAGNOSIS — I2510 Atherosclerotic heart disease of native coronary artery without angina pectoris: Secondary | ICD-10-CM | POA: Diagnosis not present

## 2019-08-26 DIAGNOSIS — I959 Hypotension, unspecified: Secondary | ICD-10-CM

## 2019-08-26 MED ORDER — FUROSEMIDE 20 MG PO TABS
ORAL_TABLET | ORAL | 0 refills | Status: DC
Start: 2019-08-26 — End: 2019-09-21

## 2019-08-26 NOTE — Progress Notes (Signed)
Cardiology Office Note  Date:  08/26/2019   ID:  [name], Erik [name] 08/22/1945, MRN [id number]  PCP:  Erik Morale, MD  Cardiologist:  Dr. Manya [name] _____________  Follow-up for edema and pre-op cardiac evaluation  _____________   History of Present Illness: Erik [name] is a 74 y.o. male who presents today for cardiology follow-up for edema. 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 He was seen by Dr. Curt [name] earlier this year where he reported fatigue, weakness and mild lower leg edema. PCP had placed him on prednisone and he gained 30 lbs. He was given Lasix 20 mg x 5 days. Afib was not well controlled and Diltiazem was increased to 180 mg daily.   Per chart review 07/08/19 patient reported improvement of edema  He was last seen 07/31/19 and he reported he took lasix for 5 days with improvement. Once he stopped the lasix lower extremity edema returned. He called the office who gave him a 3 day prescription for lasix. At the visit BP was low. HCTZ was stopped in the setting of Lasix use and hypotension. Losartan was cut in half. Echo was ordered for persistent sob and LLE.   The patient called 6/24 saying edema had returned and Dr. Curt [name] recommended he increase the lasix 40 mg daily and than he went back to 20 mg daily.   Today, he returns for follow-up for edema, review of echo as well as cardiac evaluation prior to L hip replacement that is scheduled for 09/01/19. He reports lower leg swelling is still the same. He feels increasing the lasix did not help although weights have been stable around 260lbs. Says he doesn't have swelling in the morning but after an hour or 2 on his feet or sitting down his feet are swollen. Reports he has tried compression stockings but does not like them. He would like to try a higher dose of lasix. He denies worsening sob,  orthopnea, or abdominal fullness. Patient reports he has not had low blood pressures since the last visit.  Patient has been having right and left hip pain for the last 6 months. It seems prednisone seemed to initially improve the pain. However recently has severe bilateral hip pain that has limited his function. He is hardly able to get himself out of the car. Prior to the hip pain he was very functional and able to do heavy yard work, walk 1-2 blocks and up a flight of stairs.   He denies symptoms of palpitations, chest pain, shortness of breath, orthopnea, PND, lower extremity edema, claudication, dizziness, presyncope, syncope, bleeding, or neurologic sequela. The patient is tolerating medications without difficulties and is otherwise without complaint today.    Echo 08/21/19 1. Left ventricular ejection fraction, by estimation, is 60 to 65%. The  left ventricle has normal function. The left ventricle has no regional  wall motion abnormalities. There is mild asymmetric left ventricular  hypertrophy. Left ventricular diastolic  parameters were normal. The average left ventricular global longitudinal  strain is -19.0 %.  2. Right ventricular systolic function is normal. The right ventricular  size is normal.  3. The mitral valve is normal in structure. Trivial mitral valve  regurgitation. No evidence of mitral stenosis.  4. The aortic valve is grossly normal. Aortic valve regurgitation is not  visualized. No aortic stenosis is present.   Myoview 05/2014 Overall Impression:  Low risk stress nuclear study with a small sized, mild intensity reversible defect concering for mild apical-inferior/inferoseptal ischeima.  Clinical Correlation is recommended..  _____________   Past Medical History:  Diagnosis Date  . Anxiety   . Arthritis   . Atypical chest pain 06/26/2015  . BPH with urinary obstruction   . Carotid disease, bilateral ([name])    a. 09/2014 Carotid U/S: 1-39% bilat ICA  stenosis.  . Chronic lower back pain   . CKD (chronic kidney disease), stage III    pt. denies  . Diverticulitis 12/2016  . Diverticulosis   . Dyspnea    at rest and exertion  . Gout    "couple days/year" (05/17/2017)  . History of kidney stones   . Hyperlipidemia   . Hypertension   . Hypertensive heart disease   . Hypothyroidism   . Internal hemorrhoids   . Long QT interval 06/26/2015  . Nephrolithiasis   . Neuropathy 07/30/2017  . Non-obstructive CAD    a. 07/2002 Cath: LM 20, LAD 70m/d, LCX 50-46m, OM1 47m, RCA 78m, EF 60%; b. 05/2014 MV: low risk w/ small sized, mild intensity rev defect in apical/inferior/infsept area, nl EF->Med Rx.  . Panic attack   . Paroxysmal atrial fibrillation ([place])    a. Dx 04/2014; b. 05/2014 Echo: Ef 60-65%, no rwma, triv MR/TR, nl RV;  c. CHA2DS2VASc = 3-->was on eliquis, switched to xarelto 04/2015 2/2 cost; d. 05/2015 Tikosyn loaded w/ conversion to AF; e. 06/2015 QTc prolongation and bradycardia->tikosyn d/c'd, PPM placed, Amio started; f. 07/01/2015 In Aflutter @ clinic f/u.  [name] Paroxysmal atrial flutter ([place])    a. 06/2015 noted to be in rapid Aflutter in Afib clinic-->amio load continued.  . Persistent atrial fibrillation ([place]) 06/21/2015  . Presence of permanent cardiac pacemaker    [employer]  . Prolapsed internal hemorrhoids, grade 2-3 10/19/2016   LL Gr 3 and RP/RA Gr 2 LL banded 10/19/2016   . RLL pneumonia 11/05/2012  . Sleep apnea    "can't tolerate mask" (05/17/2017)  . Tachy-brady syndrome ([name])    a. 06/27/2015 s/p SJM DC PPM (ser # @ [id number]).   Past Surgical History:  Procedure Laterality Date  . ATRIAL FIBRILLATION ABLATION N/A 05/17/2017   Procedure: ATRIAL FIBRILLATION ABLATION;  Surgeon: Erik Haw, MD;  Location: [name];  Service: Cardiovascular;  Laterality: N/A;  . BACK SURGERY    . CARDIAC CATHETERIZATION  ~ 2000  . CLOSED REDUCTION HAND FRACTURE Right 1984  . COLONOSCOPY  06/27/2015   per Dr. Henrene [name], internal  hemorrhoids and diverticulae, repeat 10 yrs  . CYSTOSCOPY WITH RETROGRADE PYELOGRAM, URETEROSCOPY AND STENT PLACEMENT Bilateral 02/14/2018   Procedure: CYSTOSCOPY WITH RETROGRADE PYELOGRAM, URETEROSCOPY AND STENT PLACEMENT;  Surgeon: Alexis Frock, MD;  Location: [facility];  Service: Urology;  Laterality: Bilateral;  . EP IMPLANTABLE DEVICE N/A 06/27/2015   Procedure: Pacemaker Implant;  Surgeon: Evans Lance, MD;  Location: [name];  Service: Cardiovascular;  Laterality: N/A;  . FRACTURE SURGERY     right hand,finger  . HEMORRHOID BANDING    . HOLMIUM LASER APPLICATION Bilateral 98/33/8250   Procedure: HOLMIUM LASER APPLICATION;  Surgeon: Alexis Frock, MD;  Location: [facility];  Service: Urology;  Laterality: Bilateral;  . INSERT / REPLACE / REMOVE PACEMAKER    . LACERATION REPAIR Right 1984   "hand"  . POSTERIOR LUMBAR FUSION  1998   2 lumbar discs, Dr. Ellene Route; "ray cages"   _____________  Current Outpatient Medications  Medication Sig Dispense Refill  . acetaminophen (TYLENOL) 500 MG tablet Take 1,000 mg by mouth every 6 (six) hours as needed for moderate pain.     [name] BESIVANCE 0.6 % SUSP Place 1 drop into the left eye 3 (three) times daily.    [name] diltiazem (CARDIZEM CD) 180 MG 24 hr capsule Take 1 capsule (180 mg total) by mouth daily. 90 capsule 2  . diltiazem (CARDIZEM) 30 MG tablet Cardizem 30mg  -- take 1 tablet every 4 hours AS NEEDED for heart rate >100 as long as blood pressure >100. 90 tablet 1  . DUREZOL 0.05 % EMUL Place 1 drop into the left eye 3 (three) times daily.    . furosemide (LASIX) 20 MG tablet Take 1 tablet (20 mg total) by mouth daily. (Patient taking differently: Take 40 mg by mouth daily. ) 30 tablet 3  . gabapentin (NEURONTIN) 600 MG tablet Take 1 tablet (600 mg total) by mouth 3 (three) times daily. 270 tablet 1  . levothyroxine (SYNTHROID) 50 MCG tablet TAKE 1 TABLET(50 MCG) BY MOUTH DAILY (Patient taking differently: Take 50 mcg by mouth daily before  breakfast. TAKE 1 TABLET(50 MCG) BY MOUTH DAILY) 90 tablet 0  . losartan (COZAAR) 50 MG tablet Take 1 tablet (50 mg total) by mouth daily. 30 tablet 3  . potassium chloride (KLOR-CON) 10 MEQ tablet Take 1 tablet (10 mEq total) by mouth daily. 30 tablet 3  . PROLENSA 0.07 % SOLN Place 1 drop into the left eye at bedtime.    [name] REFRESH OPTIVE ADVANCED 0.5-1-0.5 % SOLN Apply 1 drop to eye 2 (two) times daily as needed (dry eyes).     . simvastatin (ZOCOR) 20 MG tablet TAKE 1 TABLET BY MOUTH EVERY DAY (Patient taking differently: Take 20 mg by mouth daily. ) 90 tablet 2  . tamsulosin (FLOMAX) 0.4 MG CAPS capsule TAKE ONE CAPSULE BY MOUTH EVERY DAY (Patient taking differently: Take 0.4 mg by mouth daily. ) 90 capsule 3  . XARELTO 20 MG TABS tablet TAKE 1 TABLET BY MOUTH DAILY WITH SUPPER (Patient taking differently: Take 20 mg by mouth daily with supper. ) 90 tablet 2   No current facility-administered medications for this visit.   _____________   Allergies:   Patient has no known allergies.  _____________   Social History:  The patient  reports that he quit smoking about 24 years ago. His smoking use included cigarettes. He has a 56.00 pack-year smoking history. He has never used smokeless tobacco. He reports previous alcohol use. He reports that he does not use drugs.  _____________   Family History:  The patient's family history includes Cancer in his paternal aunt; Dementia in his mother; Diabetes in his paternal grandmother; Stroke in his father and paternal aunt.  _____________   ROS:  Please see the history of present illness.   Positive for mild lower leg edema,   All other systems are reviewed and negative.  _____________   PHYSICAL EXAM: VS:  There were no vitals taken for this visit. , BMI There is no height or weight on file to calculate BMI. GEN: Well nourished, well developed, in no acute distress  HEENT: normal  Neck: no JVD, carotid bruits, or masses Cardiac: RRR; no murmurs,  rubs, or gallops. No clubbing, cyanosis, mld pedal edema.  Radials/DP/PT 2+ and equal bilaterally.  Respiratory:  clear to auscultation bilaterally, normal work of breathing GI: soft, nontender, nondistended, + BS MS: no deformity or atrophy  Skin: warm  and dry, no rash Neuro:  Strength and sensation are intact Psych: euthymic mood, full affect _____________  EKG:   The ekg ordered today shows NSR HR 84bpm, PRI 146 ms, nonspecific T wave changes III aVL  Recent Labs: 01/14/2019: ALT 15; TSH 0.91 07/31/2019: NT-Pro BNP 80 08/21/2019: BUN 17; Creatinine, Ser 1.32; Hemoglobin 14.1; Platelets 260; Potassium 3.9; Sodium 143  05/06/2019: Cholesterol 165; HDL 52.40; LDL Cholesterol 84; Total CHOL/HDL Ratio 3; Triglycerides 144.0; VLDL 28.8  Estimated Creatinine Clearance: 65.7 mL/min (A) (by C-G formula based on SCr of 1.32 mg/dL (H)).  Wt Readings from Last 3 Encounters:  08/21/19 264 lb 4.8 oz (119.9 kg)  08/20/19 260 lb (117.9 kg)  07/31/19 260 lb (117.9 kg)    _____________   ASSESSMENT AND PLAN:  Pre-op Cardiac evaluation - planning surgery L hip arthoplasty scheduled 09/01/19. Eventually will need R hip replacement - Functional status is limited by severe B/L pain. Prior to this he was very functional.  - Denies chest pain, sob, palpitations, orthopnea. He does have stable LLE since being prescribed prednisone that has since improved with lasix. He is not longer on prednisone. Plan to increase last for 3 days and re-try compression stockings. - Echo showed preserved EF  - Last myoview in 2016 showed low risk with small sized, mild intensity reversible defect.  - EKG with no changes - Patient needs repeat carotid dopplers however is not urgent - Prior to B/L hip pain METS > 4 - Revised cardiac Risk index shows Class I Risk, 3.9% 30 day risk of death, MI, or cardiac arrest.  - It is likely okay to proceed with surgery. Hold Xarelto 3 days prior to procedure.   Lower extremity edema -  PCP gave prednisone for orthopedic complaints and gained 30 lbs.  - He was given lasix 20 mg x 5 days which improved symptoms and then later started on lasix 20 mg daily. He recently had increase in lasix to 40 mg x 3 days which he feels did not improve symptoms.  - Recent labs showed stable creatinine and electrolytes - Echo showed LVEF 22-02%, normal diasotlic parameters, mild asymmetric LVH, trivial MR - Patient reports persistent edema that is worse at night. Weights are stabe. Denies worsening sob, orthopnea, CP. Patient would like to try high dose of lasix. He does have mild pedal edema on exam. Will try lasix 60 mg for 3 days and then back to 20 mg daily. Also recommended to re-try compression stockings because it seem swelling is gravity dependent. Let him know after the surgery he might see increased swelling and to call us if it gets severe.   Hypotension with history of HTN - Blood pressure low at the last visit with systolics in the 54Y. Diltiazem had been previously increased for better control of afib. Losartan was decreased to 50 mg daily. HCTZ was stopped and Lasix 20 mg added.  - Since the last visit BP have been 110-130/60-80s - continue with Losartan 50 mg daily, Diltiazem and Lasix  Persistent afib s/p ablation 04/2017 - Xarelto and diltiazem - CHADSVASC =2 (HTN and Age) - plan to hold Xarelto 3 days prior to procedure and resume post-op  CKD stage 2 - Most recent creatinine stable at 1.32  Carotid artery disease - B/L ICA 1-39% last carotid US 2016 - Needs repeat, not urgent  HLD - Statin - Last LDL 84 04/2019   Disposition:   FU with Dr. [name] 3 months   Signed, Rachele Lamaster H  Kathlen Mody, NP 08/26/2019 9:50 AM    _____________ [facility] [address] [place] [address]  [phone] (office) [phone] (fax)

## 2019-08-26 NOTE — Patient Instructions (Addendum)
Medication Instructions:  Your physician has recommended you make the following change in your medication:  1.  INCREASE Lasix to 20 mg taking 3 tablets by mouth X's 3 days then go back to taking 1 tablet daily   *If you need a refill on your cardiac medications before your next appointment, please call your pharmacy*   Lab Work: None ordered  If you have labs (blood work) drawn today and your tests are completely normal, you will receive your results only by: [name] MyChart Message (if you have MyChart) OR . A paper copy in the mail If you have any lab test that is abnormal or we need to change your treatment, we will call you to review the results.   Testing/Procedures: None ordered   Follow-Up: At [facility], you and your health needs are our priority.  As part of our continuing mission to provide you with exceptional heart care, we have created designated Provider Care Teams.  These Care Teams include your primary Cardiologist (physician) and Advanced Practice Providers (APPs -  Physician Assistants and Nurse Practitioners) who all work together to provide you with the care you need, when you need it.  We recommend signing up for the patient portal called "MyChart".  Sign up information is provided on this After Visit Summary.  MyChart is used to connect with patients for Virtual Visits (Telemedicine).  Patients are able to view lab/test results, encounter notes, upcoming appointments, etc.  Non-urgent messages can be sent to your provider as well.   To learn more about what you can do with MyChart, go to [email or web address].    Your next appointment:   3 month(s)  The format for your next appointment:   In Person  Provider:   You may see Quay Burow, MD or one of the following Advanced Practice Providers on your designated Care Team:    Kerin Ransom, PA-C  [place], [place]  Coletta Memos, [place]    Other Instructions

## 2019-08-27 NOTE — Anesthesia Preprocedure Evaluation (Addendum)
Anesthesia Evaluation  Patient identified by MRN, date of birth, ID band Patient awake    Reviewed: Allergy & Precautions, NPO status , Patient's Chart, lab work & pertinent test results  Airway Mallampati: I  TM Distance: >3 FB Neck ROM: Full    Dental no notable dental hx. (+) Edentulous Upper, Edentulous Lower, Lower Dentures, Upper Dentures   Pulmonary sleep apnea , former smoker,    Pulmonary exam normal breath sounds clear to auscultation       Cardiovascular hypertension, Pt. on medications + CAD  Normal cardiovascular exam+ pacemaker  Rhythm:Regular Rate:Normal  Echo 08/21/19:  1. Left ventricular ejection fraction, by estimation, is 60 to 65%. The left ventricle has normal function. The left ventricle has no regional wall motion abnormalities. There is mild asymmetric left ventricular hypertrophy. Left ventricular diastolic parameters were normal. The average left ventricular global longitudinal strain is -19.0 %.    Neuro/Psych    GI/Hepatic Neg liver ROS,   Endo/Other  Hypothyroidism   Renal/GU      Musculoskeletal  (+) Arthritis ,   Abdominal (+) + obese,   Peds  Hematology Lab Results      Component                Value               Date                      WBC                      7.0                 08/21/2019                HGB                      14.1                08/21/2019                HCT                      43.2                08/21/2019                MCV                      92.9                08/21/2019                PLT                      260                 08/21/2019              Anesthesia Other Findings   Reproductive/Obstetrics                           Anesthesia Physical Anesthesia Plan  ASA: III  Anesthesia Plan: Spinal   Post-op Pain Management:    Induction:   PONV Risk Score and Plan: 2 and Treatment may vary due to age or medical  condition, Ondansetron and Midazolam  Airway  Management Planned: Nasal Cannula and Natural Airway  Additional Equipment: None  Intra-op Plan:   Post-operative Plan:   Informed Consent:     Dental advisory given  Plan Discussed with:   Anesthesia Plan Comments: (See APP note by Durel Salts, FNP  Pt On Xarelto Check Last dose ( Friday 7/2).   Plan Spinal )      Anesthesia Quick Evaluation

## 2019-08-27 NOTE — Care Plan (Signed)
Ortho Bundle Case Management Note  Patient Details  Name: Erik [name] MRN: [id number] Date of Birth: 1945/07/11   SPoke with patient's wife [name], prior to surgery. She is a Marine scientist. He will discharge to home with her. Rolling walker ordered. OPPT set up at [facility] Questions answered. Patient and MD in agreement with plan. Choice offered                   DME Arranged:  Walker rolling DME Agency:  [employer]  HH Arranged:    Love Agency:     Additional Comments: Please contact me with any questions of if this plan should need to change.  Ladell Heads,  [facility]  [phone] 08/27/2019, 2:44 PM

## 2019-08-28 ENCOUNTER — Other Ambulatory Visit (HOSPITAL_COMMUNITY)
Admission: RE | Admit: 2019-08-28 | Discharge: 2019-08-28 | Disposition: A | Discharge status: Home / Self Care | Payer: Medicare Other | Source: Ambulatory Visit | Attending: Orthopaedic Surgery | Admitting: Orthopaedic Surgery

## 2019-08-28 DIAGNOSIS — Z01812 Encounter for preprocedural laboratory examination: Secondary | ICD-10-CM | POA: Diagnosis present

## 2019-08-28 DIAGNOSIS — Z20822 Contact with and (suspected) exposure to covid-19: Secondary | ICD-10-CM | POA: Insufficient documentation

## 2019-08-28 LAB — SARS CORONAVIRUS 2 (TAT 6-24 HRS): SARS Coronavirus 2: NEGATIVE

## 2019-08-31 MED ORDER — BUPIVACAINE LIPOSOME 1.3 % IJ SUSP
10.0000 mL | Freq: Once | INTRAMUSCULAR | Status: DC
  Filled 2019-08-31: qty 10

## 2019-08-31 MED ORDER — DEXTROSE 5 % IV SOLN
3.0000 g | INTRAVENOUS | Status: AC
  Administered 2019-09-01: 3 g via INTRAVENOUS
  Filled 2019-08-31: qty 3

## 2019-08-31 MED ORDER — TRANEXAMIC ACID 1000 MG/10ML IV SOLN
2000.0000 mg | INTRAVENOUS | Status: DC
  Filled 2019-08-31: qty 20

## 2019-08-31 NOTE — H&P (Signed)
TOTAL HIP ADMISSION H&P  Patient is admitted for left total hip arthroplasty.  Subjective:  Chief Complaint: left hip pain  HPI: Erik [name], 74 y.o. male, has a history of pain and functional disability in the left hip(s) due to arthritis and patient has failed non-surgical conservative treatments for greater than 12 weeks to include NSAID's and/or analgesics, corticosteriod injections, flexibility and strengthening excercises, use of assistive devices, weight reduction as appropriate and activity modification.  Onset of symptoms was gradual starting 5 years ago with gradually worsening course since that time.The patient noted no past surgery on the left hip(s).  Patient currently rates pain in the left hip at 10 out of 10 with activity. Patient has night pain, worsening of pain with activity and weight bearing, trendelenberg gait, pain that interfers with activities of daily living and crepitus. Patient has evidence of subchondral cysts, subchondral sclerosis, periarticular osteophytes and joint space narrowing by imaging studies. This condition presents safety issues increasing the risk of falls. There is no current active infection.  Patient Active Problem List   Diagnosis Date Noted  . Herpes zoster without complication 17/00/1749  . Primary osteoarthritis of right hip 10/24/2018  . Neuropathy 07/30/2017  . Prolapsed internal hemorrhoids, grade 2-3 10/19/2016  . Constipation 09/29/2015  . CKD (chronic kidney disease), stage III   . Dizziness 06/26/2015  . Long QT interval 06/26/2015  . Visit for monitoring Tikosyn therapy 06/26/2015  . Atypical chest pain 06/26/2015  . Persistent atrial fibrillation ([place]) 06/21/2015  . Hypertensive heart disease   . Hypothyroidism   . Non-obstructive CAD   . Carotid disease, bilateral ([place])   . OBESITY 09/20/2008  . Hyperlipidemia 12/17/2005  . Essential hypertension 12/17/2005   Past Medical History:  Diagnosis Date  . Anxiety   .  Arthritis   . Atypical chest pain 06/26/2015  . BPH with urinary obstruction   . Carotid disease, bilateral ([place])    a. 09/2014 Carotid U/S: 1-39% bilat ICA stenosis.  . Chronic lower back pain   . CKD (chronic kidney disease), stage III    pt. denies  . Diverticulitis 12/2016  . Diverticulosis   . Dyspnea    at rest and exertion  . Gout    "couple days/year" (05/17/2017)  . History of kidney stones   . Hyperlipidemia   . Hypertension   . Hypertensive heart disease   . Hypothyroidism   . Internal hemorrhoids   . Long QT interval 06/26/2015  . Nephrolithiasis   . Neuropathy 07/30/2017  . Non-obstructive CAD    a. 07/2002 Cath: LM 20, LAD 24m/d, LCX 50-23m, OM1 34m, RCA 65m, EF 60%; b. 05/2014 MV: low risk w/ small sized, mild intensity rev defect in apical/inferior/infsept area, nl EF->Med Rx.  . Panic attack   . Paroxysmal atrial fibrillation ([place])    a. Dx 04/2014; b. 05/2014 Echo: Ef 60-65%, no rwma, triv MR/TR, nl RV;  c. CHA2DS2VASc = 3-->was on eliquis, switched to xarelto 04/2015 2/2 cost; d. 05/2015 Tikosyn loaded w/ conversion to AF; e. 06/2015 QTc prolongation and bradycardia->tikosyn d/c'd, PPM placed, Amio started; f. 07/01/2015 In Aflutter @ clinic f/u.  [name] Paroxysmal atrial flutter ([place])    a. 06/2015 noted to be in rapid Aflutter in Afib clinic-->amio load continued.  . Persistent atrial fibrillation ([place]) 06/21/2015  . Presence of permanent cardiac pacemaker    [employer]  . Prolapsed internal hemorrhoids, grade 2-3 10/19/2016   LL Gr 3 and RP/RA Gr 2 LL banded 10/19/2016   .  RLL pneumonia 11/05/2012  . Sleep apnea    "can't tolerate mask" (05/17/2017)  . Tachy-brady syndrome ([place])    a. 06/27/2015 s/p SJM DC PPM (ser # @ [id number]).    Past Surgical History:  Procedure Laterality Date  . ATRIAL FIBRILLATION ABLATION N/A 05/17/2017   Procedure: ATRIAL FIBRILLATION ABLATION;  Surgeon: Constance Haw, MD;  Location: [facility];  Service: Cardiovascular;  Laterality: N/A;  .  BACK SURGERY    . CARDIAC CATHETERIZATION  ~ 2000  . CLOSED REDUCTION HAND FRACTURE Right 1984  . COLONOSCOPY  06/27/2015   per Dr. Henrene Pastor, internal hemorrhoids and diverticulae, repeat 10 yrs  . CYSTOSCOPY WITH RETROGRADE PYELOGRAM, URETEROSCOPY AND STENT PLACEMENT Bilateral 02/14/2018   Procedure: CYSTOSCOPY WITH RETROGRADE PYELOGRAM, URETEROSCOPY AND STENT PLACEMENT;  Surgeon: Alexis Frock, MD;  Location: [facility];  Service: Urology;  Laterality: Bilateral;  . EP IMPLANTABLE DEVICE N/A 06/27/2015   Procedure: Pacemaker Implant;  Surgeon: Evans Lance, MD;  Location: [facility];  Service: Cardiovascular;  Laterality: N/A;  . FRACTURE SURGERY     right hand,finger  . HEMORRHOID BANDING    . HOLMIUM LASER APPLICATION Bilateral 99/24/2683   Procedure: HOLMIUM LASER APPLICATION;  Surgeon: Alexis Frock, MD;  Location: [facility];  Service: Urology;  Laterality: Bilateral;  . INSERT / REPLACE / REMOVE PACEMAKER    . LACERATION REPAIR Right 1984   "hand"  . POSTERIOR LUMBAR FUSION  1998   2 lumbar discs, Dr. Ellene Route; "ray cages"    Current Facility-Administered Medications  Medication Dose Route Frequency Provider Last Rate Last Admin  . [START ON 09/01/2019] bupivacaine liposome (EXPAREL) 1.3 % injection 133 mg  10 mL Other Once Minda Ditto, RPH      . [START ON 09/01/2019] ceFAZolin (ANCEF) 3 g in dextrose 5 % 50 mL IVPB  3 g Intravenous On Call to OR Minda Ditto, [place]      . [START ON 09/01/2019] tranexamic acid (CYKLOKAPRON) 2,000 mg in sodium chloride 0.9 % 50 mL Topical Application  4,196 mg Topical To OR Minda Ditto, RPH       Current Outpatient Medications  Medication Sig Dispense Refill Last Dose  . acetaminophen (TYLENOL) 500 MG tablet Take 1,000 mg by mouth every 6 (six) hours as needed for moderate pain.      [name] diltiazem (CARDIZEM CD) 180 MG 24 hr capsule Take 1 capsule (180 mg total) by mouth daily. 90 capsule 2   . diltiazem (CARDIZEM) 30 MG tablet Cardizem 30mg  -- take  1 tablet every 4 hours AS NEEDED for heart rate >100 as long as blood pressure >100. 90 tablet 1   . levothyroxine (SYNTHROID) 50 MCG tablet TAKE 1 TABLET(50 MCG) BY MOUTH DAILY 90 tablet 0   . losartan (COZAAR) 50 MG tablet Take 1 tablet (50 mg total) by mouth daily. 30 tablet 3   . potassium chloride (KLOR-CON) 10 MEQ tablet Take 1 tablet (10 mEq total) by mouth daily. 30 tablet 3   . PROLENSA 0.07 % SOLN Place 1 drop into the left eye at bedtime.     . simvastatin (ZOCOR) 20 MG tablet TAKE 1 TABLET BY MOUTH EVERY DAY 90 tablet 2   . tamsulosin (FLOMAX) 0.4 MG CAPS capsule TAKE ONE CAPSULE BY MOUTH EVERY DAY 90 capsule 3   . XARELTO 20 MG TABS tablet TAKE 1 TABLET BY MOUTH DAILY WITH SUPPER 90 tablet 2   . Besifloxacin HCl (BESIVANCE) 0.6 % SUSP  Place 1 drop in right eye 3 timers daily     . bromfenac (XIBROM) 0.09 % ophthalmic solution Place 1 drop into both eyes 2 (two) times daily.     [name] DIFLUPREDNATE OP Apply to eye. Plkace 1 drop in right eye two times daily     . furosemide (LASIX) 20 MG tablet Take 3 tablets by mouth daily for 3 days then go back down to 1 tablet by mouth daily 99 tablet 0   . gabapentin (NEURONTIN) 600 MG tablet Take 1 tablet (600 mg total) by mouth 3 (three) times daily. 270 tablet 1    No Known Allergies  Social History   Tobacco Use  . Smoking status: Former Smoker    Packs/day: 2.00    Years: 28.00    Pack years: 56.00    Types: Cigarettes    Quit date: 02/27/1995    Years since quitting: 24.5  . Smokeless tobacco: Never Used  Substance Use Topics  . Alcohol use: Not Currently    Alcohol/week: 0.0 standard drinks    Comment: couple times a month    Family History  Problem Relation Age of Onset  . Dementia Mother   . Stroke Father   . Diabetes Paternal Grandmother   . Stroke Paternal Aunt        x 2  . Cancer Paternal Aunt        type unknown  . Colon cancer Neg Hx   . Stomach cancer Neg Hx      Review of Systems  Musculoskeletal: Positive for  arthralgias.       Left hip  All other systems reviewed and are negative.   Objective:  Physical Exam Constitutional:      Appearance: Normal appearance.  HENT:     Head: Normocephalic and atraumatic.     Mouth/Throat:     Mouth: Mucous membranes are moist.     Pharynx: Oropharynx is clear.  Eyes:     Extraocular Movements: Extraocular movements intact.     Pupils: Pupils are equal, round, and reactive to light.  Cardiovascular:     Rate and Rhythm: Normal rate.     Pulses: Normal pulses.  Pulmonary:     Effort: Pulmonary effort is normal.  Abdominal:     Palpations: Abdomen is soft.  Musculoskeletal:     Cervical back: Normal range of motion.     Comments: Both hips are extremely painful in internal rotation and his motion is limited.  He walks with a markedly altered gait.  Straight leg raise is negative.  Sensation and motor function are intact in his feet with palpable pulses on both sides.  Skin:    General: Skin is warm and dry.  Neurological:     General: No focal deficit present.     Mental Status: He is alert and oriented to person, place, and time. Mental status is at baseline.  Psychiatric:        Mood and Affect: Mood normal.        Behavior: Behavior normal.        Thought Content: Thought content normal.        Judgment: Judgment normal.     Vital signs in last 24 hours:    Labs:   Estimated body mass index is 37.91 kg/m as calculated from the following:   Height as of 08/26/19: 5\' 10"  (1.778 m).   Weight as of 08/26/19: 119.8 kg.   Imaging Review Plain radiographs demonstrate severe degenerative  joint disease of the left hip(s). The bone quality appears to be good for age and reported activity level.      Assessment/Plan:  End stage primary arthritis, left hip(s)  The patient history, physical examination, clinical judgement of the provider and imaging studies are consistent with end stage degenerative joint disease of the left hip(s) and  total hip arthroplasty is deemed medically necessary. The treatment options including medical management, injection therapy, arthroscopy and arthroplasty were discussed at length. The risks and benefits of total hip arthroplasty were presented and reviewed. The risks due to aseptic loosening, infection, stiffness, dislocation/subluxation,  thromboembolic complications and other imponderables were discussed.  The patient acknowledged the explanation, agreed to proceed with the plan and consent was signed. Patient is being admitted for inpatient treatment for surgery, pain control, PT, OT, prophylactic antibiotics, VTE prophylaxis, progressive ambulation and ADL's and discharge planning.The patient is planning to be discharged home with home health services

## 2019-09-01 ENCOUNTER — Encounter (HOSPITAL_COMMUNITY)
Admission: RE | Disposition: A | Discharge status: Home / Self Care | Payer: Self-pay | Source: Home / Self Care | Attending: Orthopaedic Surgery

## 2019-09-01 ENCOUNTER — Ambulatory Visit (HOSPITAL_COMMUNITY): Payer: Medicare Other

## 2019-09-01 ENCOUNTER — Ambulatory Visit (HOSPITAL_COMMUNITY): Payer: Medicare Other | Admitting: Emergency Medicine

## 2019-09-01 ENCOUNTER — Other Ambulatory Visit: Payer: Self-pay

## 2019-09-01 ENCOUNTER — Observation Stay (HOSPITAL_COMMUNITY)
Admission: RE | Admit: 2019-09-01 | Discharge: 2019-09-02 | Disposition: A | Discharge status: Home / Self Care | Payer: Medicare Other | Attending: Orthopaedic Surgery | Admitting: Orthopaedic Surgery

## 2019-09-01 ENCOUNTER — Encounter (HOSPITAL_COMMUNITY): Payer: Self-pay | Admitting: Orthopaedic Surgery

## 2019-09-01 DIAGNOSIS — I480 Paroxysmal atrial fibrillation: Secondary | ICD-10-CM | POA: Diagnosis not present

## 2019-09-01 DIAGNOSIS — E785 Hyperlipidemia, unspecified: Secondary | ICD-10-CM | POA: Diagnosis not present

## 2019-09-01 DIAGNOSIS — Z981 Arthrodesis status: Secondary | ICD-10-CM | POA: Insufficient documentation

## 2019-09-01 DIAGNOSIS — E039 Hypothyroidism, unspecified: Secondary | ICD-10-CM | POA: Diagnosis not present

## 2019-09-01 DIAGNOSIS — I129 Hypertensive chronic kidney disease with stage 1 through stage 4 chronic kidney disease, or unspecified chronic kidney disease: Secondary | ICD-10-CM | POA: Insufficient documentation

## 2019-09-01 DIAGNOSIS — Z87891 Personal history of nicotine dependence: Secondary | ICD-10-CM | POA: Diagnosis not present

## 2019-09-01 DIAGNOSIS — M1990 Unspecified osteoarthritis, unspecified site: Secondary | ICD-10-CM | POA: Insufficient documentation

## 2019-09-01 DIAGNOSIS — I2510 Atherosclerotic heart disease of native coronary artery without angina pectoris: Secondary | ICD-10-CM | POA: Insufficient documentation

## 2019-09-01 DIAGNOSIS — G4730 Sleep apnea, unspecified: Secondary | ICD-10-CM | POA: Insufficient documentation

## 2019-09-01 DIAGNOSIS — I4819 Other persistent atrial fibrillation: Secondary | ICD-10-CM | POA: Diagnosis not present

## 2019-09-01 DIAGNOSIS — I517 Cardiomegaly: Secondary | ICD-10-CM | POA: Diagnosis not present

## 2019-09-01 DIAGNOSIS — G629 Polyneuropathy, unspecified: Secondary | ICD-10-CM | POA: Insufficient documentation

## 2019-09-01 DIAGNOSIS — Z820 Family history of epilepsy and other diseases of the nervous system: Secondary | ICD-10-CM | POA: Insufficient documentation

## 2019-09-01 DIAGNOSIS — K5900 Constipation, unspecified: Secondary | ICD-10-CM | POA: Insufficient documentation

## 2019-09-01 DIAGNOSIS — Z6837 Body mass index (BMI) 37.0-37.9, adult: Secondary | ICD-10-CM | POA: Diagnosis not present

## 2019-09-01 DIAGNOSIS — Z87442 Personal history of urinary calculi: Secondary | ICD-10-CM | POA: Insufficient documentation

## 2019-09-01 DIAGNOSIS — Z823 Family history of stroke: Secondary | ICD-10-CM | POA: Insufficient documentation

## 2019-09-01 DIAGNOSIS — I495 Sick sinus syndrome: Secondary | ICD-10-CM | POA: Diagnosis not present

## 2019-09-01 DIAGNOSIS — N183 Chronic kidney disease, stage 3 unspecified: Secondary | ICD-10-CM | POA: Diagnosis not present

## 2019-09-01 DIAGNOSIS — Z7901 Long term (current) use of anticoagulants: Secondary | ICD-10-CM | POA: Insufficient documentation

## 2019-09-01 DIAGNOSIS — Z419 Encounter for procedure for purposes other than remedying health state, unspecified: Secondary | ICD-10-CM

## 2019-09-01 DIAGNOSIS — Z833 Family history of diabetes mellitus: Secondary | ICD-10-CM | POA: Diagnosis not present

## 2019-09-01 DIAGNOSIS — F410 Panic disorder [episodic paroxysmal anxiety] without agoraphobia: Secondary | ICD-10-CM | POA: Diagnosis not present

## 2019-09-01 DIAGNOSIS — Z950 Presence of cardiac pacemaker: Secondary | ICD-10-CM | POA: Diagnosis not present

## 2019-09-01 DIAGNOSIS — E669 Obesity, unspecified: Secondary | ICD-10-CM | POA: Insufficient documentation

## 2019-09-01 DIAGNOSIS — Z79899 Other long term (current) drug therapy: Secondary | ICD-10-CM | POA: Insufficient documentation

## 2019-09-01 DIAGNOSIS — M1612 Unilateral primary osteoarthritis, left hip: Principal | ICD-10-CM | POA: Insufficient documentation

## 2019-09-01 DIAGNOSIS — Z809 Family history of malignant neoplasm, unspecified: Secondary | ICD-10-CM | POA: Insufficient documentation

## 2019-09-01 HISTORY — PX: TOTAL HIP ARTHROPLASTY: SHX124

## 2019-09-01 LAB — ABO/RH: ABO/RH(D): O POS

## 2019-09-01 LAB — APTT: aPTT: 24 seconds (ref 24–36)

## 2019-09-01 LAB — PROTIME-INR
INR: 1 (ref 0.8–1.2)
Prothrombin Time: 13 seconds (ref 11.4–15.2)

## 2019-09-01 SURGERY — ARTHROPLASTY, HIP, TOTAL, ANTERIOR APPROACH
Anesthesia: Spinal | Site: Hip | Laterality: Left

## 2019-09-01 MED ORDER — ACETAMINOPHEN 10 MG/ML IV SOLN: 1000.0000 mg | Freq: Once | INTRAVENOUS | Status: DC | PRN

## 2019-09-01 MED ORDER — POVIDONE-IODINE 10 % EX SWAB
2.0000 "application " | Freq: Once | CUTANEOUS | Status: AC
  Administered 2019-09-01: 2 via TOPICAL

## 2019-09-01 MED ORDER — LOSARTAN POTASSIUM 50 MG PO TABS
50.0000 mg | ORAL_TABLET | Freq: Every day | ORAL | Status: DC
  Administered 2019-09-02: 50 mg via ORAL
  Filled 2019-09-01: qty 1

## 2019-09-01 MED ORDER — METHOCARBAMOL 500 MG IVPB - SIMPLE MED
INTRAVENOUS | Status: AC
  Filled 2019-09-01: qty 50

## 2019-09-01 MED ORDER — TRANEXAMIC ACID-NACL 1000-0.7 MG/100ML-% IV SOLN
1000.0000 mg | INTRAVENOUS | Status: AC
  Administered 2019-09-01: 1000 mg via INTRAVENOUS
  Filled 2019-09-01: qty 100

## 2019-09-01 MED ORDER — KETOROLAC TROMETHAMINE 0.5 % OP SOLN: 1.0000 [drp] | Freq: Every day | OPHTHALMIC | Status: DC

## 2019-09-01 MED ORDER — EPHEDRINE SULFATE-NACL 50-0.9 MG/10ML-% IV SOSY
PREFILLED_SYRINGE | INTRAVENOUS | Status: DC | PRN
  Administered 2019-09-01: 5 mg via INTRAVENOUS
  Administered 2019-09-01 (×2): 10 mg via INTRAVENOUS

## 2019-09-01 MED ORDER — METOCLOPRAMIDE HCL 5 MG PO TABS: 5.0000 mg | ORAL_TABLET | Freq: Three times a day (TID) | ORAL | Status: DC | PRN

## 2019-09-01 MED ORDER — HYDROCODONE-ACETAMINOPHEN 7.5-325 MG PO TABS: 1.0000 | ORAL_TABLET | ORAL | Status: DC | PRN

## 2019-09-01 MED ORDER — LACTATED RINGERS IV SOLN: INTRAVENOUS | Status: DC

## 2019-09-01 MED ORDER — BUPIVACAINE-EPINEPHRINE (PF) 0.25% -1:200000 IJ SOLN
INTRAMUSCULAR | Status: AC
  Filled 2019-09-01: qty 30

## 2019-09-01 MED ORDER — PHENYLEPHRINE 40 MCG/ML (10ML) SYRINGE FOR IV PUSH (FOR BLOOD PRESSURE SUPPORT)
PREFILLED_SYRINGE | INTRAVENOUS | Status: AC
  Filled 2019-09-01: qty 10

## 2019-09-01 MED ORDER — ONDANSETRON HCL 4 MG/2ML IJ SOLN
INTRAMUSCULAR | Status: AC
  Filled 2019-09-01: qty 2

## 2019-09-01 MED ORDER — DILTIAZEM HCL ER COATED BEADS 180 MG PO CP24
180.0000 mg | ORAL_CAPSULE | Freq: Every day | ORAL | Status: DC
  Administered 2019-09-02: 180 mg via ORAL
  Filled 2019-09-01: qty 1

## 2019-09-01 MED ORDER — METHOCARBAMOL 500 MG IVPB - SIMPLE MED
500.0000 mg | Freq: Four times a day (QID) | INTRAVENOUS | Status: DC | PRN
  Administered 2019-09-01: 500 mg via INTRAVENOUS
  Filled 2019-09-01: qty 50

## 2019-09-01 MED ORDER — BISACODYL 5 MG PO TBEC: 5.0000 mg | DELAYED_RELEASE_TABLET | Freq: Every day | ORAL | Status: DC | PRN

## 2019-09-01 MED ORDER — MENTHOL 3 MG MT LOZG: 1.0000 | LOZENGE | OROMUCOSAL | Status: DC | PRN

## 2019-09-01 MED ORDER — DIPHENHYDRAMINE HCL 12.5 MG/5ML PO ELIX: 12.5000 mg | ORAL_SOLUTION | ORAL | Status: DC | PRN

## 2019-09-01 MED ORDER — DIFLUPREDNATE 0.05 % OP EMUL
1.0000 [drp] | Freq: Two times a day (BID) | OPHTHALMIC | Status: DC
  Filled 2019-09-01: qty 5

## 2019-09-01 MED ORDER — RIVAROXABAN 10 MG PO TABS
10.0000 mg | ORAL_TABLET | Freq: Every day | ORAL | Status: DC
  Administered 2019-09-02: 10 mg via ORAL
  Filled 2019-09-01: qty 1

## 2019-09-01 MED ORDER — ONDANSETRON HCL 4 MG PO TABS: 4.0000 mg | ORAL_TABLET | Freq: Four times a day (QID) | ORAL | Status: DC | PRN

## 2019-09-01 MED ORDER — METOCLOPRAMIDE HCL 5 MG/ML IJ SOLN: 5.0000 mg | Freq: Three times a day (TID) | INTRAMUSCULAR | Status: DC | PRN

## 2019-09-01 MED ORDER — GABAPENTIN 300 MG PO CAPS
600.0000 mg | ORAL_CAPSULE | Freq: Three times a day (TID) | ORAL | Status: DC
  Administered 2019-09-01 – 2019-09-02 (×3): 600 mg via ORAL
  Filled 2019-09-01 (×3): qty 2

## 2019-09-01 MED ORDER — DILTIAZEM HCL 30 MG PO TABS
30.0000 mg | ORAL_TABLET | Freq: Four times a day (QID) | ORAL | Status: DC | PRN
  Filled 2019-09-01: qty 1

## 2019-09-01 MED ORDER — DEXAMETHASONE SODIUM PHOSPHATE 10 MG/ML IJ SOLN
INTRAMUSCULAR | Status: AC
  Filled 2019-09-01: qty 1

## 2019-09-01 MED ORDER — BUPIVACAINE LIPOSOME 1.3 % IJ SUSP
INTRAMUSCULAR | Status: DC | PRN
  Administered 2019-09-01: 10 mL

## 2019-09-01 MED ORDER — KETOROLAC TROMETHAMINE 0.5 % OP SOLN
1.0000 [drp] | Freq: Four times a day (QID) | OPHTHALMIC | Status: DC
  Filled 2019-09-01: qty 5

## 2019-09-01 MED ORDER — ONDANSETRON HCL 4 MG/2ML IJ SOLN
INTRAMUSCULAR | Status: DC | PRN
  Administered 2019-09-01: 4 mg via INTRAVENOUS

## 2019-09-01 MED ORDER — PROPOFOL 500 MG/50ML IV EMUL
INTRAVENOUS | Status: DC | PRN
  Administered 2019-09-01: 75 ug/kg/min via INTRAVENOUS

## 2019-09-01 MED ORDER — GATIFLOXACIN 0.5 % OP SOLN
1.0000 [drp] | Freq: Four times a day (QID) | OPHTHALMIC | Status: DC
  Filled 2019-09-01: qty 2.5

## 2019-09-01 MED ORDER — LEVOTHYROXINE SODIUM 50 MCG PO TABS
50.0000 ug | ORAL_TABLET | Freq: Every day | ORAL | Status: DC
  Administered 2019-09-02: 50 ug via ORAL
  Filled 2019-09-01: qty 1

## 2019-09-01 MED ORDER — TAMSULOSIN HCL 0.4 MG PO CAPS
0.4000 mg | ORAL_CAPSULE | Freq: Every day | ORAL | Status: DC
  Administered 2019-09-01 – 2019-09-02 (×2): 0.4 mg via ORAL
  Filled 2019-09-01 (×2): qty 1

## 2019-09-01 MED ORDER — BUPIVACAINE IN DEXTROSE 0.75-8.25 % IT SOLN
INTRATHECAL | Status: DC | PRN
  Administered 2019-09-01: 12 mg via INTRATHECAL

## 2019-09-01 MED ORDER — MORPHINE SULFATE (PF) 2 MG/ML IV SOLN: 0.5000 mg | INTRAVENOUS | Status: DC | PRN

## 2019-09-01 MED ORDER — MIDAZOLAM HCL 2 MG/2ML IJ SOLN
INTRAMUSCULAR | Status: AC
  Filled 2019-09-01: qty 2

## 2019-09-01 MED ORDER — ACETAMINOPHEN 325 MG PO TABS: 325.0000 mg | ORAL_TABLET | Freq: Four times a day (QID) | ORAL | Status: DC | PRN

## 2019-09-01 MED ORDER — DEXAMETHASONE SODIUM PHOSPHATE 10 MG/ML IJ SOLN
INTRAMUSCULAR | Status: DC | PRN
  Administered 2019-09-01: 5 mg via INTRAVENOUS

## 2019-09-01 MED ORDER — HYDROCODONE-ACETAMINOPHEN 5-325 MG PO TABS
1.0000 | ORAL_TABLET | ORAL | Status: DC | PRN
  Administered 2019-09-01 – 2019-09-02 (×4): 1 via ORAL
  Filled 2019-09-01 (×4): qty 1

## 2019-09-01 MED ORDER — FENTANYL CITRATE (PF) 100 MCG/2ML IJ SOLN
INTRAMUSCULAR | Status: AC
  Filled 2019-09-01: qty 2

## 2019-09-01 MED ORDER — CEFAZOLIN SODIUM-DEXTROSE 2-4 GM/100ML-% IV SOLN
2.0000 g | Freq: Four times a day (QID) | INTRAVENOUS | Status: AC
  Administered 2019-09-01 (×2): 2 g via INTRAVENOUS
  Filled 2019-09-01 (×2): qty 100

## 2019-09-01 MED ORDER — TRANEXAMIC ACID-NACL 1000-0.7 MG/100ML-% IV SOLN
1000.0000 mg | Freq: Once | INTRAVENOUS | Status: AC
  Administered 2019-09-01: 1000 mg via INTRAVENOUS
  Filled 2019-09-01: qty 100

## 2019-09-01 MED ORDER — ONDANSETRON HCL 4 MG/2ML IJ SOLN: 4.0000 mg | Freq: Four times a day (QID) | INTRAMUSCULAR | Status: DC | PRN

## 2019-09-01 MED ORDER — CHLORHEXIDINE GLUCONATE 0.12 % MT SOLN
15.0000 mL | Freq: Once | OROMUCOSAL | Status: AC
  Administered 2019-09-01: 15 mL via OROMUCOSAL

## 2019-09-01 MED ORDER — DIFLUPREDNATE 0.05 % OP EMUL: 1.0000 [drp] | Freq: Two times a day (BID) | OPHTHALMIC | Status: DC

## 2019-09-01 MED ORDER — SIMVASTATIN 20 MG PO TABS: 20.0000 mg | ORAL_TABLET | Freq: Every day | ORAL | Status: DC

## 2019-09-01 MED ORDER — ATORVASTATIN CALCIUM 10 MG PO TABS
10.0000 mg | ORAL_TABLET | Freq: Every day | ORAL | Status: DC
  Administered 2019-09-01: 10 mg via ORAL
  Filled 2019-09-01: qty 1

## 2019-09-01 MED ORDER — FENTANYL CITRATE (PF) 100 MCG/2ML IJ SOLN: 25.0000 ug | INTRAMUSCULAR | Status: DC | PRN

## 2019-09-01 MED ORDER — METHOCARBAMOL 500 MG PO TABS
500.0000 mg | ORAL_TABLET | Freq: Four times a day (QID) | ORAL | Status: DC | PRN
  Administered 2019-09-01 – 2019-09-02 (×2): 500 mg via ORAL
  Filled 2019-09-01 (×2): qty 1

## 2019-09-01 MED ORDER — FUROSEMIDE 20 MG PO TABS
20.0000 mg | ORAL_TABLET | Freq: Every day | ORAL | Status: DC
  Administered 2019-09-01 – 2019-09-02 (×2): 20 mg via ORAL
  Filled 2019-09-01 (×2): qty 1

## 2019-09-01 MED ORDER — PHENOL 1.4 % MT LIQD: 1.0000 | OROMUCOSAL | Status: DC | PRN

## 2019-09-01 MED ORDER — MIDAZOLAM HCL 5 MG/5ML IJ SOLN
INTRAMUSCULAR | Status: DC | PRN
  Administered 2019-09-01: 2 mg via INTRAVENOUS

## 2019-09-01 MED ORDER — ALUM & MAG HYDROXIDE-SIMETH 200-200-20 MG/5ML PO SUSP: 30.0000 mL | ORAL | Status: DC | PRN

## 2019-09-01 MED ORDER — ONDANSETRON HCL 4 MG/2ML IJ SOLN: 4.0000 mg | Freq: Once | INTRAMUSCULAR | Status: DC | PRN

## 2019-09-01 MED ORDER — EPHEDRINE 5 MG/ML INJ
INTRAVENOUS | Status: AC
  Filled 2019-09-01: qty 10

## 2019-09-01 MED ORDER — KETOROLAC TROMETHAMINE 15 MG/ML IJ SOLN
7.5000 mg | Freq: Four times a day (QID) | INTRAMUSCULAR | Status: AC
  Administered 2019-09-01 – 2019-09-02 (×4): 7.5 mg via INTRAVENOUS
  Filled 2019-09-01 (×4): qty 1

## 2019-09-01 MED ORDER — POTASSIUM CHLORIDE ER 10 MEQ PO TBCR
10.0000 meq | EXTENDED_RELEASE_TABLET | Freq: Every day | ORAL | Status: DC
  Administered 2019-09-01 – 2019-09-02 (×2): 10 meq via ORAL
  Filled 2019-09-01 (×3): qty 1

## 2019-09-01 MED ORDER — ACETAMINOPHEN 500 MG PO TABS
500.0000 mg | ORAL_TABLET | Freq: Four times a day (QID) | ORAL | Status: AC
  Administered 2019-09-01 – 2019-09-02 (×4): 500 mg via ORAL
  Filled 2019-09-01 (×4): qty 1

## 2019-09-01 MED ORDER — BUPIVACAINE-EPINEPHRINE (PF) 0.25% -1:200000 IJ SOLN
INTRAMUSCULAR | Status: DC | PRN
  Administered 2019-09-01: 30 mL via PERINEURAL

## 2019-09-01 MED ORDER — ORAL CARE MOUTH RINSE: 15.0000 mL | Freq: Once | OROMUCOSAL | Status: AC

## 2019-09-01 MED ORDER — 0.9 % SODIUM CHLORIDE (POUR BTL) OPTIME
TOPICAL | Status: DC | PRN
  Administered 2019-09-01: 1000 mL

## 2019-09-01 MED ORDER — DOCUSATE SODIUM 100 MG PO CAPS
100.0000 mg | ORAL_CAPSULE | Freq: Two times a day (BID) | ORAL | Status: DC
  Administered 2019-09-01 – 2019-09-02 (×2): 100 mg via ORAL
  Filled 2019-09-01 (×2): qty 1

## 2019-09-01 MED ORDER — FENTANYL CITRATE (PF) 100 MCG/2ML IJ SOLN
INTRAMUSCULAR | Status: DC | PRN
  Administered 2019-09-01 (×2): 50 ug via INTRAVENOUS

## 2019-09-01 MED ORDER — PROPOFOL 1000 MG/100ML IV EMUL
INTRAVENOUS | Status: AC
  Filled 2019-09-01: qty 100

## 2019-09-01 MED ORDER — TRANEXAMIC ACID 1000 MG/10ML IV SOLN
INTRAVENOUS | Status: DC | PRN
  Administered 2019-09-01: 2000 mg via TOPICAL

## 2019-09-01 MED ORDER — PHENYLEPHRINE 40 MCG/ML (10ML) SYRINGE FOR IV PUSH (FOR BLOOD PRESSURE SUPPORT)
PREFILLED_SYRINGE | INTRAVENOUS | Status: DC | PRN
  Administered 2019-09-01 (×4): 80 ug via INTRAVENOUS

## 2019-09-01 SURGICAL SUPPLY — 46 items
BAG DECANTER FOR FLEXI CONT (MISCELLANEOUS) ×3 IMPLANT
BLADE SAW SGTL 18X1.27X75 (BLADE) ×2 IMPLANT
BLADE SAW SGTL 18X1.27X75MM (BLADE) ×1
BOOTIES KNEE HIGH SLOAN (MISCELLANEOUS) ×3 IMPLANT
CELLS DAT CNTRL 66122 CELL SVR (MISCELLANEOUS) ×1 IMPLANT
COVER PERINEAL POST (MISCELLANEOUS) ×3 IMPLANT
COVER SURGICAL LIGHT HANDLE (MISCELLANEOUS) ×3 IMPLANT
COVER WAND RF STERILE (DRAPES) IMPLANT
CUP ACET GRIPTION SERIS 56 100 (Trauma) ×1 IMPLANT
DECANTER SPIKE VIAL GLASS SM (MISCELLANEOUS) ×3 IMPLANT
DRAPE IMP U-DRAPE 54X76 (DRAPES) ×3 IMPLANT
DRAPE STERI IOBAN 125X83 (DRAPES) ×3 IMPLANT
DRAPE U-SHAPE 47X51 STRL (DRAPES) ×6 IMPLANT
DRESSING AQUACEL AG SP 3.5X6 (GAUZE/BANDAGES/DRESSINGS) ×1 IMPLANT
DRSG AQUACEL AG ADV 3.5X 6 (GAUZE/BANDAGES/DRESSINGS) ×3 IMPLANT
DRSG AQUACEL AG SP 3.5X6 (GAUZE/BANDAGES/DRESSINGS) ×3
DURAPREP 26ML APPLICATOR (WOUND CARE) ×3 IMPLANT
ELECT BLADE TIP CTD 4 INCH (ELECTRODE) ×3 IMPLANT
ELECT REM PT RETURN 15FT ADLT (MISCELLANEOUS) ×3 IMPLANT
ELIMINATOR HOLE APEX DEPUY (Hips) ×3 IMPLANT
GIPTION SERIES 56 100 (Trauma) ×3 IMPLANT
GLOVE BIO SURGEON STRL SZ8 (GLOVE) ×6 IMPLANT
GLOVE BIOGEL PI IND STRL 8 (GLOVE) ×2 IMPLANT
GLOVE BIOGEL PI INDICATOR 8 (GLOVE) ×4
GOWN STRL REUS W/TWL XL LVL3 (GOWN DISPOSABLE) ×6 IMPLANT
HEAD CERAMIC DELTA 36 PLUS 1.5 (Hips) ×3 IMPLANT
HOLDER FOLEY CATH W/STRAP (MISCELLANEOUS) ×3 IMPLANT
KIT TURNOVER KIT A (KITS) IMPLANT
MANIFOLD NEPTUNE II (INSTRUMENTS) ×3 IMPLANT
NEEDLE HYPO 22GX1.5 SAFETY (NEEDLE) ×3 IMPLANT
NS IRRIG 1000ML POUR BTL (IV SOLUTION) ×3 IMPLANT
PACK ANTERIOR HIP CUSTOM (KITS) ×3 IMPLANT
PENCIL SMOKE EVACUATOR (MISCELLANEOUS) IMPLANT
PINNACLE ALTRX PLUS 4 N 36X56 (Hips) ×3 IMPLANT
PROTECTOR NERVE ULNAR (MISCELLANEOUS) ×3 IMPLANT
RTRCTR WOUND ALEXIS 18CM MED (MISCELLANEOUS) ×3
STEM FEM ACTIS STD SZ10 (Stem) ×3 IMPLANT
SUT ETHIBOND NAB CT1 #1 30IN (SUTURE) ×6 IMPLANT
SUT VIC AB 1 CT1 36 (SUTURE) ×3 IMPLANT
SUT VIC AB 2-0 CT1 27 (SUTURE) ×3
SUT VIC AB 2-0 CT1 TAPERPNT 27 (SUTURE) ×1 IMPLANT
SUT VICRYL AB 3-0 FS1 BRD 27IN (SUTURE) ×3 IMPLANT
SUT VLOC 180 0 24IN GS25 (SUTURE) ×3 IMPLANT
SYR 50ML LL SCALE MARK (SYRINGE) ×3 IMPLANT
TRAY FOLEY MTR SLVR 16FR STAT (SET/KITS/TRAYS/PACK) ×3 IMPLANT
YANKAUER SUCT BULB TIP 10FT TU (MISCELLANEOUS) ×3 IMPLANT

## 2019-09-01 NOTE — Plan of Care (Signed)
  Problem: Education: Goal: Knowledge of General Education information will improve Description Including pain rating scale, medication(s)/side effects and non-pharmacologic comfort measures Outcome: Progressing   Problem: Health Behavior/Discharge Planning: Goal: Ability to manage health-related needs will improve Outcome: Progressing   

## 2019-09-01 NOTE — Progress Notes (Signed)
Pharmacy note regarding simvastatin drug interaction:  Per the therapeutic substitution protocol, simvastatin 20 mg has been substituted with atorvastatin 10 mg due to a drug interaction with diltiazem.  Please consider this change upon discharge.  Thank you, Shela Commons, PharmD, BCPS Clinical Pharmacist

## 2019-09-01 NOTE — Discharge Instructions (Signed)
Information on my medicine - XARELTO (Rivaroxaban)  This medication education was reviewed with me or my healthcare representative as part of my discharge preparation.  The pharmacist that spoke with me during my hospital stay was:    Why was Xarelto prescribed for you? Xarelto was prescribed for you to reduce the risk of blood clots forming after orthopedic surgery. The medical term for these abnormal blood clots is venous thromboembolism (VTE).  What do you need to know about xarelto ? Take your Xarelto ONCE DAILY at the same time every day. You may take it either with or without food.  If you have difficulty swallowing the tablet whole, you may crush it and mix in applesauce just prior to taking your dose.  Take Xarelto exactly as prescribed by your doctor and DO NOT stop taking Xarelto without talking to the doctor who prescribed the medication.  Stopping without other VTE prevention medication to take the place of Xarelto may increase your risk of developing a clot.  After discharge, you should have regular check-up appointments with your healthcare provider that is prescribing your Xarelto.    What do you do if you miss a dose? If you miss a dose, take it as soon as you remember on the same day then continue your regularly scheduled once daily regimen the next day. Do not take two doses of Xarelto on the same day.   Important Safety Information A possible side effect of Xarelto is bleeding. You should call your healthcare provider right away if you experience any of the following: ? Bleeding from an injury or your nose that does not stop. ? Unusual colored urine (red or dark brown) or unusual colored stools (red or black). ? Unusual bruising for unknown reasons. ? A serious fall or if you hit your head (even if there is no bleeding).  Some medicines may interact with Xarelto and might increase your risk of bleeding while on Xarelto. To help avoid this, consult your  healthcare provider or pharmacist prior to using any new prescription or non-prescription medications, including herbals, vitamins, non-steroidal anti-inflammatory drugs (NSAIDs) and supplements.  This website has more information on Xarelto: [email or web address]

## 2019-09-01 NOTE — Op Note (Signed)
PRE-OP DIAGNOSIS:  LEFT HIP DEGENERATIVE JOINT DISEASE POST-OP DIAGNOSIS: same PROCEDURE:  LEFT TOTAL HIP ARTHROPLASTY ANTERIOR APPROACH ANESTHESIA:  Spinal and MAC SURGEON:  Melrose Nakayama MD ASSISTANT:  Loni Dolly PA-C   INDICATIONS FOR PROCEDURE:  The patient is a 74 y.o. male with a long history of a painful hip.  This has persisted despite multiple conservative measures.  The patient has persisted with pain and dysfunction making rest and activity difficult.  A total hip replacement is offered as surgical treatment.  Informed operative consent was obtained after discussion of possible complications including reaction to anesthesia, infection, neurovascular injury, dislocation, DVT, PE, and death.  The importance of the postoperative rehab program to optimize result was stressed with the patient.  SUMMARY OF FINDINGS AND PROCEDURE:  Under the above anesthesia through a anterior approach an the Hana table a left THR was performed.  The patient had severe degenerative change and excellent bone quality.  We used DePuy components to replace the hip and these were size 10 Actis femur capped with a +1.5 65m ceramic hip ball.  On the acetabular side we used a size 56 Gription shell with a plus 4 neutral polyethylene liner.  We did use a hole eliminator.  ALoni DollyPA-C assisted throughout and was invaluable to the completion of the case in that he helped position and retract while I performed the procedure.  He also closed simultaneously to help minimize OR time.  I used fluoroscopy throughout the case to check position of components and leg lengths and read all these views myself.  DESCRIPTION OF PROCEDURE:  The patient was taken to the OR suite where the above anesthetic was applied.  The patient was then positioned on the Hana table supine.  All bony prominences were appropriately padded.  Prep and drape was then performed in normal sterile fashion.  The patient was given kefzol preoperative  antibiotic and an appropriate time out was performed.  We then took an anterior approach to the left hip.  Dissection was taken through adipose to the tensor fascia lata fascia.  This structure was incised longitudinally and we dissected in the intermuscular interval just medial to this muscle.  Cobra retractors were placed superior and inferior to the femoral neck superficial to the capsule.  A capsular incision was then made and the retractors were placed along the femoral neck.  Xray was brought in to get a good level for the femoral neck cut which was made with an oscillating saw and osteotome.  The femoral head was removed with a corkscrew.  The acetabulum was exposed and some labral tissues were excised. Reaming was taken to the inside wall of the pelvis and sequentially up to 1 mm smaller than the actual component.  A trial of components was done and then the aforementioned acetabular shell was placed in appropriate tilt and anteversion confirmed by fluoroscopy. The liner was placed along with the hole eliminator and attention was turned to the femur.  The leg was brought down and over into adduction and the elevator bar was used to raise the femur up gently in the wound.  The piriformis was released with care taken to preserve the obturator internus attachment and all of the posterior capsule. The femur was reamed and then broached to the appropriate size.  A trial reduction was done and the aforementioned head and neck assembly gave uKoreathe best stability in extension with external rotation.  Leg lengths were felt to be about equal by fluoroscopic  exam.  The trial components were removed and the wound irrigated.  We then placed the femoral component in appropriate anteversion.  The head was applied to a dry stem neck and the hip again reduced.  It was again stable in the aforementioned position.  The would was irrigated again followed by re-approximation of anterior capsule with ethibond suture. Tensor  fascia was repaired with V-loc suture  followed by deep closure with #O and #2 undyed vicryl.  Skin was closed with subQ stitch and steristrips followed by a sterile dressing.  EBL and IOF can be obtained from anesthesia records.  DISPOSITION:  The patient was extubated in the OR and taken to PACU in stable condition to be admitted to the Orthopedic Surgery for appropriate post-op care to include perioperative antibiotics and DVT prophylaxis.

## 2019-09-01 NOTE — Anesthesia Procedure Notes (Addendum)
Spinal  Patient location during procedure: OR Start time: 09/01/2019 7:32 AM End time: 09/01/2019 7:43 AM Staffing Anesthesiologist: Barnet Glasgow, MD Preanesthetic Checklist Completed: patient identified, IV checked, site marked, risks and benefits discussed, surgical consent, monitors and equipment checked, pre-op evaluation and timeout performed Spinal Block Patient position: sitting Prep: DuraPrep Patient monitoring: heart rate, cardiac monitor, continuous pulse ox and blood pressure Approach: midline Location: L2-3 Injection technique: single-shot Needle Needle type: Sprotte  Needle gauge: 24 G Needle length: 9 cm Needle insertion depth: 7 cm Assessment Sensory level: T4 Additional Notes 3 attempts

## 2019-09-01 NOTE — Anesthesia Postprocedure Evaluation (Signed)
Anesthesia Post Note  Patient: Erik [name]  Procedure(s) Performed: LEFT TOTAL HIP ARTHROPLASTY ANTERIOR APPROACH (Left Hip)     Patient location during evaluation: Nursing Unit Anesthesia Type: Spinal Level of consciousness: oriented and awake and alert Pain management: pain level controlled Vital Signs Assessment: post-procedure vital signs reviewed and stable Respiratory status: spontaneous breathing and respiratory function stable Cardiovascular status: blood pressure returned to baseline and stable Postop Assessment: no headache, no backache, no apparent nausea or vomiting and patient able to bend at knees Anesthetic complications: no   No complications documented.  Last Vitals:  Vitals:   09/01/19 0931 09/01/19 0945  BP: (!) 135/57 (!) 111/57  Pulse: 81 75  Resp: 14 20  Temp: 36.5 C   SpO2: 98% 98%    Last Pain:  Vitals:   09/01/19 0931  TempSrc:   PainSc: 0-No pain                 Barnet Glasgow

## 2019-09-01 NOTE — Interval H&P Note (Signed)
History and Physical Interval Note:  09/01/2019 7:22 AM  Erik [name]  has presented today for surgery, with the diagnosis of LEFT HIP DEGENERATIVE JOINT DISEASE.  The various methods of treatment have been discussed with the patient and family. After consideration of risks, benefits and other options for treatment, the patient has consented to  Procedure(s): LEFT TOTAL HIP ARTHROPLASTY ANTERIOR APPROACH (Left) as a surgical intervention.  The patient's history has been reviewed, patient examined, no change in status, stable for surgery.  I have reviewed the patient's chart and labs.  Questions were answered to the patient's satisfaction.     Hessie Dibble

## 2019-09-01 NOTE — Transfer of Care (Signed)
Immediate Anesthesia Transfer of Care Note  Patient: Erik [name]  Procedure(s) Performed: LEFT TOTAL HIP ARTHROPLASTY ANTERIOR APPROACH (Left Hip)  Patient Location: PACU  Anesthesia Type:Spinal  Level of Consciousness: awake, alert , oriented and patient cooperative  Airway & Oxygen Therapy: Patient Spontanous Breathing and Patient connected to face mask oxygen  Post-op Assessment: Report given to RN and Post -op Vital signs reviewed and stable  Post vital signs: Reviewed and stable  Last Vitals:  Vitals Value Taken Time  BP 135/57 09/01/19 0931  Temp    Pulse 76 09/01/19 0937  Resp 13 09/01/19 0937  SpO2 97 % 09/01/19 0937  Vitals shown include unvalidated device data.  Last Pain:  Vitals:   09/01/19 0631  TempSrc:   PainSc: 0-No pain      Patients Stated Pain Goal: 3 ([id number])  Complications: No complications documented.

## 2019-09-01 NOTE — Plan of Care (Signed)
Plan of care 

## 2019-09-01 NOTE — Evaluation (Signed)
Physical Therapy Evaluation Patient Details Name: Erik [name] MRN: [id number] DOB: 18-Mar-1945 Today's Date: 09/01/2019   History of Present Illness  s/p L DA THA. PMH: HTN, pacemaker, obesity, back surgery x2  Clinical Impression  Pt is s/p THA resulting in the deficits listed below (see PT Problem List).  Pt amb ~ 74' with RW and min assist, anticipate steady progress in acute setting   Pt will benefit from skilled PT to increase their independence and safety with mobility to allow discharge to the venue listed below.      Follow Up Recommendations Follow surgeon's recommendation for DC plan and follow-up therapies    Equipment Recommendations  Rolling walker with 5" wheels    Recommendations for Other Services       Precautions / Restrictions Precautions Precautions: Fall Restrictions Weight Bearing Restrictions: No      Mobility  Bed Mobility Overal bed mobility: Needs Assistance Bed Mobility: Supine to Sit     Supine to sit: Min assist;HOB elevated     General bed mobility comments: light assist with trunk, incr time  Transfers Overall transfer level: Needs assistance Equipment used: Rolling walker (2 wheeled) Transfers: Sit to/from Stand Sit to Stand: Min assist;Min guard         General transfer comment: cues for hand placement  Ambulation/Gait Ambulation/Gait assistance: Min guard Gait Distance (Feet): 50 Feet Assistive device: Rolling walker (2 wheeled) Gait Pattern/deviations: Step-to pattern;Decreased stance time - left     General Gait Details: cues for RW distance from self and sequence  Stairs            Wheelchair Mobility    Modified Rankin (Stroke Patients Only)       Balance                                             Pertinent Vitals/Pain Pain Assessment: No/denies pain    Home Living Family/patient expects to be discharged to:: Private residence Living Arrangements: Spouse/significant  other Available Help at Discharge: Family Type of Home: House Home Access: Stairs to enter   Technical brewer of Steps: 1 Home Layout: One level Home Equipment: None Additional Comments: wife (retired Therapist, sports) will be assisting after surgery    Prior Function Level of Independence: Independent               Hand Dominance        Extremity/Trunk Assessment   Upper Extremity Assessment Upper Extremity Assessment: Overall WFL for tasks assessed    Lower Extremity Assessment Lower Extremity Assessment: LLE deficits/detail LLE Deficits / Details: ankle  WFL, knee and hip grossly 2+/5, limited by post op pain and weakness       Communication   Communication: No difficulties  Cognition Arousal/Alertness: Awake/alert Behavior During Therapy: WFL for tasks assessed/performed Overall Cognitive Status: Within Functional Limits for tasks assessed                                        General Comments      Exercises Total Joint Exercises Ankle Circles/Pumps: AROM;Both;10 reps Quad Sets: AROM;Both;10 reps   Assessment/Plan    PT Assessment Patient needs continued PT services  PT Problem List Decreased strength;Decreased range of motion;Decreased activity tolerance;Decreased knowledge of use of DME;Pain;Decreased mobility  PT Treatment Interventions DME instruction;Therapeutic exercise;Gait training;Stair training;Functional mobility training;Therapeutic activities;Patient/family education    PT Goals (Current goals can be found in the Care Plan section)  Acute Rehab PT Goals Patient Stated Goal: home PT Goal Formulation: With patient Time For Goal Achievement: 09/08/19 Potential to Achieve Goals: Good    Frequency 7X/week   Barriers to discharge        Co-evaluation               AM-PAC PT "6 Clicks" Mobility  Outcome Measure Help needed turning from your back to your side while in a flat bed without using bedrails?: A  Little Help needed moving from lying on your back to sitting on the side of a flat bed without using bedrails?: A Little Help needed moving to and from a bed to a chair (including a wheelchair)?: A Little Help needed standing up from a chair using your arms (e.g., wheelchair or bedside chair)?: A Little Help needed to walk in hospital room?: A Little Help needed climbing 3-5 steps with a railing? : A Little 6 Click Score: 18    End of Session Equipment Utilized During Treatment: Gait belt Activity Tolerance: Patient tolerated treatment well Patient left: with call bell/phone within reach;in chair;with chair alarm set   PT Visit Diagnosis: Difficulty in walking, not elsewhere classified (R26.2)    Time: 4462-8638 PT Time Calculation (min) (ACUTE ONLY): 32 min   Charges:   PT Evaluation $PT Eval Low Complexity: 1 Low PT Treatments $Gait Training: 8-22 mins        Baxter Flattery, PT  Acute Rehab Dept ([facility]) [phone] Pager [phone]  09/01/2019   [facility] 09/01/2019, 2:59 PM

## 2019-09-02 ENCOUNTER — Encounter (HOSPITAL_COMMUNITY): Payer: Self-pay | Admitting: Orthopaedic Surgery

## 2019-09-02 DIAGNOSIS — M1612 Unilateral primary osteoarthritis, left hip: Secondary | ICD-10-CM | POA: Diagnosis not present

## 2019-09-02 MED ORDER — HYDROCODONE-ACETAMINOPHEN 5-325 MG PO TABS: 1.0000 | ORAL_TABLET | Freq: Four times a day (QID) | ORAL | 0 refills | Status: DC | PRN

## 2019-09-02 MED ORDER — TIZANIDINE HCL 4 MG PO TABS
4.0000 mg | ORAL_TABLET | Freq: Four times a day (QID) | ORAL | 1 refills | Status: DC | PRN
Start: 2019-09-02 — End: 2020-07-08

## 2019-09-02 NOTE — Plan of Care (Signed)
  Problem: Education: Goal: Knowledge of General Education information will improve Description: Including pain rating scale, medication(s)/side effects and non-pharmacologic comfort measures Outcome: Adequate for Discharge   Problem: Health Behavior/Discharge Planning: Goal: Ability to manage health-related needs will improve Outcome: Adequate for Discharge   Problem: Clinical Measurements: Goal: Ability to maintain clinical measurements within normal limits will improve Outcome: Adequate for Discharge Goal: Will remain free from infection Outcome: Adequate for Discharge Goal: Diagnostic test results will improve Outcome: Adequate for Discharge Goal: Respiratory complications will improve Outcome: Adequate for Discharge Goal: Cardiovascular complication will be avoided Outcome: Adequate for Discharge   Problem: Activity: Goal: Risk for activity intolerance will decrease Outcome: Adequate for Discharge   Problem: Nutrition: Goal: Adequate nutrition will be maintained Outcome: Adequate for Discharge   Problem: Coping: Goal: Level of anxiety will decrease Outcome: Adequate for Discharge   Problem: Elimination: Goal: Will not experience complications related to bowel motility Outcome: Adequate for Discharge Goal: Will not experience complications related to urinary retention Outcome: Adequate for Discharge   Problem: Pain Managment: Goal: General experience of comfort will improve Outcome: Adequate for Discharge   Problem: Education: Goal: Understanding of discharge needs will improve Outcome: Adequate for Discharge Goal: Individualized Educational Video(s) Outcome: Adequate for Discharge   Problem: Activity: Goal: Ability to tolerate increased activity will improve Outcome: Adequate for Discharge   Problem: Clinical Measurements: Goal: Postoperative complications will be avoided or minimized Outcome: Adequate for Discharge   Problem: Pain Management: Goal: Pain  level will decrease with appropriate interventions Outcome: Adequate for Discharge

## 2019-09-02 NOTE — Progress Notes (Signed)
Patient being discharged in stable condition. All questions answered appropriately. Patient wife present during discharge instructions.

## 2019-09-02 NOTE — TOC Transition Note (Signed)
Transition of Care [facility]) - CM/SW Discharge Note   Patient Details  Name: Erik [name] MRN: [id number] Date of Birth: April 16, 1945  Transition of Care [facility]) CM/SW Contact:  Lia Hopping, [place] Phone Number: 09/02/2019, 9:48 AM   Clinical Narrative:    [facility] Plan of Care, see note.      Patient Goals and CMS Choice        Discharge Placement                       Discharge Plan and Services                DME Arranged: Walker rolling DME Agency: [place]                  Social Determinants of Health (SDOH) Interventions     Readmission Risk Interventions No flowsheet data found.

## 2019-09-02 NOTE — Progress Notes (Signed)
Physical Therapy Treatment Patient Details Name: Erik [name] MRN: [id number] DOB: 02-06-1946 Today's Date: 09/02/2019    History of Present Illness s/p L DA THA. PMH: HTN, pacemaker, obesity, back surgery x2    PT Comments    Pt is progerssing well today. Anxious to d/c home; ready to d/c with wife's assist.    Follow Up Recommendations  Follow surgeon's recommendation for DC plan and follow-up therapies     Equipment Recommendations  Rolling walker with 5" wheels    Recommendations for Other Services       Precautions / Restrictions Precautions Precautions: Fall Restrictions Weight Bearing Restrictions: No    Mobility  Bed Mobility Overal bed mobility: Needs Assistance Bed Mobility: Supine to Sit     Supine to sit: HOB elevated;Min guard     General bed mobility comments: incr time   Transfers Overall transfer level: Needs assistance Equipment used: Rolling walker (2 wheeled) Transfers: Sit to/from Stand Sit to Stand: Supervision;Min guard         General transfer comment: cues for hand placement  Ambulation/Gait Ambulation/Gait assistance: Min guard;Supervision Gait Distance (Feet): 120 Feet Assistive device: Rolling walker (2 wheeled) Gait Pattern/deviations: Step-to pattern;Decreased stance time - left     General Gait Details: cues for RW distance from self and sequence   Stairs Stairs: Yes Stairs assistance: Min guard Stair Management: No rails;Step to pattern;Forwards;Backwards;With walker Number of Stairs: 1 (x3) General stair comments: cues for sequence; practiced forwards and backwards, wife present    Wheelchair Mobility    Modified Rankin (Stroke Patients Only)       Balance                                            Cognition Arousal/Alertness: Awake/alert Behavior During Therapy: WFL for tasks assessed/performed Overall Cognitive Status: Within Functional Limits for tasks assessed                                         Exercises Total Joint Exercises Ankle Circles/Pumps: AROM;Both;10 reps Quad Sets: AROM;Both;10 reps Short Arc Quad: Left;AROM;10 reps Heel Slides: AAROM;Left;10 reps Hip ABduction/ADduction: AROM;AAROM;Left;10 reps    General Comments        Pertinent Vitals/Pain Pain Assessment: 0-10 Pain Score: 5  Pain Location: L hip Pain Descriptors / Indicators: Burning Pain Intervention(s): Limited activity within patient's tolerance;Monitored during session;Premedicated before session;Repositioned;Ice applied    Home Living                      Prior Function            PT Goals (current goals can now be found in the care plan section) Acute Rehab PT Goals Patient Stated Goal: home PT Goal Formulation: With patient Time For Goal Achievement: 09/08/19 Potential to Achieve Goals: Good Progress towards PT goals: Progressing toward goals    Frequency    7X/week      PT Plan Current plan remains appropriate    Co-evaluation              AM-PAC PT "6 Clicks" Mobility   Outcome Measure  Help needed turning from your back to your side while in a flat bed without using bedrails?: A Little Help needed moving from lying on your  back to sitting on the side of a flat bed without using bedrails?: None Help needed moving to and from a bed to a chair (including a wheelchair)?: None Help needed standing up from a chair using your arms (e.g., wheelchair or bedside chair)?: A Little Help needed to walk in hospital room?: A Little Help needed climbing 3-5 steps with a railing? : A Little 6 Click Score: 20    End of Session Equipment Utilized During Treatment: Gait belt Activity Tolerance: Patient tolerated treatment well Patient left: in bed;with call bell/phone within reach;with family/visitor present   PT Visit Diagnosis: Difficulty in walking, not elsewhere classified (R26.2)     Time: 1100-1130 PT Time Calculation (min)  (ACUTE ONLY): 30 min  Charges:  $Gait Training: 8-22 mins $Therapeutic Exercise: 8-22 mins                     Baxter Flattery, PT  Acute Rehab Dept ([place]) [phone] Pager [phone]  09/02/2019    Erik [name] 09/02/2019, 2:25 PM

## 2019-09-02 NOTE — Progress Notes (Signed)
Subjective: 1 Day Post-Op Procedure(s) (LRB): LEFT TOTAL HIP ARTHROPLASTY ANTERIOR APPROACH (Left)   Patient feels well and is hoping to go home today.  Activity level:  wbat Diet tolerance:  ok Voiding:  ok Patient reports pain as mild.    Objective: Vital signs in last 24 hours: Temp:  [97.5 F (36.4 C)-98.3 F (36.8 C)] 97.9 F (36.6 C) (07/07 0616) Pulse Rate:  [61-90] 78 (07/07 0616) Resp:  [9-20] 19 (07/07 0616) BP: (84-151)/(39-77) 140/66 (07/07 0616) SpO2:  [90 %-99 %] 93 % (07/07 0616)  Labs: No results for input(s): HGB in the last 72 hours. No results for input(s): WBC, RBC, HCT, PLT in the last 72 hours. No results for input(s): NA, K, CL, CO2, BUN, CREATININE, GLUCOSE, CALCIUM in the last 72 hours. Recent Labs    09/01/19 0619  INR 1.0    Physical Exam:  Neurologically intact ABD soft Neurovascular intact Sensation intact distally Intact pulses distally Dorsiflexion/Plantar flexion intact Incision: dressing C/D/I and no drainage No cellulitis present Compartment soft  Assessment/Plan:  1 Day Post-Op Procedure(s) (LRB): LEFT TOTAL HIP ARTHROPLASTY ANTERIOR APPROACH (Left) Advance diet Up with therapy D/C IV fluids Discharge home with home health today after PT. Follow up in office 2 weeks post op Continue on home xarelto.  Erik [name] Erik [name] 09/02/2019, 8:01 AM

## 2019-09-02 NOTE — Discharge Summary (Signed)
Patient ID: TRAN ARZUAGA MRN: [id number] DOB/AGE: [birth year] 74 y.o.  Admit date: 09/01/2019 Discharge date: 09/02/2019  Admission Diagnoses:  Principal Problem:   Primary osteoarthritis of left hip   Discharge Diagnoses:  Same  Past Medical History:  Diagnosis Date  . Anxiety   . Arthritis   . Atypical chest pain 06/26/2015  . BPH with urinary obstruction   . Carotid disease, bilateral ([place])    a. 09/2014 Carotid U/S: 1-39% bilat ICA stenosis.  . Chronic lower back pain   . CKD (chronic kidney disease), stage III    pt. denies  . Diverticulitis 12/2016  . Diverticulosis   . Dyspnea    at rest and exertion  . Gout    "couple days/year" (05/17/2017)  . History of kidney stones   . Hyperlipidemia   . Hypertension   . Hypertensive heart disease   . Hypothyroidism   . Internal hemorrhoids   . Long QT interval 06/26/2015  . Nephrolithiasis   . Neuropathy 07/30/2017  . Non-obstructive CAD    a. 07/2002 Cath: LM 20, LAD 35m/d, LCX 50-56m, OM1 46m, RCA 31m, EF 60%; b. 05/2014 MV: low risk w/ small sized, mild intensity rev defect in apical/inferior/infsept area, nl EF->Med Rx.  . Panic attack   . Paroxysmal atrial fibrillation ([place])    a. Dx 04/2014; b. 05/2014 Echo: Ef 60-65%, no rwma, triv MR/TR, nl RV;  c. CHA2DS2VASc = 3-->was on eliquis, switched to xarelto 04/2015 2/2 cost; d. 05/2015 Tikosyn loaded w/ conversion to AF; e. 06/2015 QTc prolongation and bradycardia->tikosyn d/c'd, PPM placed, Amio started; f. 07/01/2015 In Aflutter @ clinic f/u.  [name] Paroxysmal atrial flutter ([place])    a. 06/2015 noted to be in rapid Aflutter in Afib clinic-->amio load continued.  . Persistent atrial fibrillation ([place]) 06/21/2015  . Presence of permanent cardiac pacemaker    [employer]  . Prolapsed internal hemorrhoids, grade 2-3 10/19/2016   LL Gr 3 and RP/RA Gr 2 LL banded 10/19/2016   . RLL pneumonia 11/05/2012  . Sleep apnea    "can't tolerate mask" (05/17/2017)  . Tachy-brady syndrome ([place])    a.  06/27/2015 s/p SJM DC PPM (ser # @ [id number]).    Surgeries: Procedure(s): LEFT TOTAL HIP ARTHROPLASTY ANTERIOR APPROACH on 09/01/2019   Consultants:   Discharged Condition: Improved  Hospital Course: CORRION STIREWALT is an 74 y.o. male who was admitted 09/01/2019 for operative treatment ofPrimary osteoarthritis of left hip. Patient has severe unremitting pain that affects sleep, daily activities, and work/hobbies. After pre-op clearance the patient was taken to the operating room on 09/01/2019 and underwent  Procedure(s): LEFT TOTAL HIP ARTHROPLASTY ANTERIOR APPROACH.    Patient was given perioperative antibiotics:  Anti-infectives (From admission, onward)   Start     Dose/Rate Route Frequency Ordered Stop   09/01/19 1400  ceFAZolin (ANCEF) IVPB 2g/100 mL premix        2 g 200 mL/hr over 30 Minutes Intravenous Every 6 hours 09/01/19 1232 09/01/19 2051   09/01/19 0600  ceFAZolin (ANCEF) 3 g in dextrose 5 % 50 mL IVPB        3 g 100 mL/hr over 30 Minutes Intravenous On call to O.R. 08/31/19 1696 09/01/19 0731       Patient was given sequential compression devices, early ambulation, and chemoprophylaxis to prevent DVT.  Patient benefited maximally from hospital stay and there were no complications.    Recent vital signs:  Patient Vitals for the past 24 hrs:  BP  Temp Temp src Pulse Resp SpO2  09/02/19 0616 140/66 97.9 F (36.6 C) Oral 78 19 93 %  09/02/19 0152 (!) 128/59 98.3 F (36.8 C) Oral 72 16 92 %  09/01/19 2132 115/60 97.6 F (36.4 C) Oral 72 17 95 %  09/01/19 1527 118/64 97.8 F (36.6 C) Oral 90 16 94 %  09/01/19 1429 (!) 151/70 -- -- 90 16 90 %  09/01/19 1325 139/64 (!) 97.5 F (36.4 C) Oral 67 16 95 %  09/01/19 1226 121/65 97.7 F (36.5 C) Oral 67 18 97 %  09/01/19 1200 124/60 97.7 F (36.5 C) -- 68 13 99 %  09/01/19 1101 -- -- -- 71 14 95 %  09/01/19 1100 (!) 127/55 97.7 F (36.5 C) -- 64 14 92 %  09/01/19 1030 (!) 89/77 -- -- 65 16 94 %  09/01/19 1016 (!) 108/59 -- --  61 (!) 9 96 %  09/01/19 1015 (!) 84/75 97.7 F (36.5 C) -- 63 17 94 %  09/01/19 1005 (!) 117/55 -- -- 65 15 96 %  09/01/19 1000 (!) 91/39 -- -- 69 13 94 %  09/01/19 0945 (!) 111/57 -- -- 75 20 98 %  09/01/19 0931 (!) 135/57 97.7 F (36.5 C) -- 81 14 98 %     Recent laboratory studies:  Recent Labs    09/01/19 0619  INR 1.0     Discharge Medications:   Allergies as of 09/02/2019   No Known Allergies     Medication List    TAKE these medications   acetaminophen 500 MG tablet Commonly known as: TYLENOL Take 1,000 mg by mouth every 6 (six) hours as needed for moderate pain.   Besivance 0.6 % Susp Generic drug: Besifloxacin HCl Place 1 drop in right eye 3 timers daily   DIFLUPREDNATE OP Apply to eye. Plkace 1 drop in right eye two times daily   diltiazem 180 MG 24 hr capsule Commonly known as: CARDIZEM CD Take 1 capsule (180 mg total) by mouth daily.   diltiazem 30 MG tablet Commonly known as: Cardizem Cardizem 30mg  -- take 1 tablet every 4 hours AS NEEDED for heart rate >100 as long as blood pressure >100.   furosemide 20 MG tablet Commonly known as: LASIX Take 3 tablets by mouth daily for 3 days then go back down to 1 tablet by mouth daily   gabapentin 600 MG tablet Commonly known as: NEURONTIN Take 1 tablet (600 mg total) by mouth 3 (three) times daily.   HYDROcodone-acetaminophen 5-325 MG tablet Commonly known as: NORCO/VICODIN Take 1-2 tablets by mouth every 6 (six) hours as needed for moderate pain or severe pain (post op pain).   levothyroxine 50 MCG tablet Commonly known as: SYNTHROID TAKE 1 TABLET(50 MCG) BY MOUTH DAILY   losartan 50 MG tablet Commonly known as: COZAAR Take 1 tablet (50 mg total) by mouth daily.   potassium chloride 10 MEQ tablet Commonly known as: KLOR-CON Take 1 tablet (10 mEq total) by mouth daily.   bromfenac 0.09 % ophthalmic solution Commonly known as: XIBROM Place 1 drop into both eyes 2 (two) times daily.   Prolensa  0.07 % Soln Generic drug: Bromfenac Sodium Place 1 drop into the left eye at bedtime.   simvastatin 20 MG tablet Commonly known as: ZOCOR TAKE 1 TABLET BY MOUTH EVERY DAY   tamsulosin 0.4 MG Caps capsule Commonly known as: FLOMAX TAKE ONE CAPSULE BY MOUTH EVERY DAY   tiZANidine 4 MG tablet Commonly known as:  Zanaflex Take 1 tablet (4 mg total) by mouth every 6 (six) hours as needed for muscle spasms.   Xarelto 20 MG Tabs tablet Generic drug: rivaroxaban TAKE 1 TABLET BY MOUTH DAILY WITH SUPPER            Durable Medical Equipment  (From admission, onward)         Start     Ordered   09/01/19 1233  DME Walker rolling  Once       Question:  Patient needs a walker to treat with the following condition  Answer:  Primary osteoarthritis of left hip   09/01/19 1232   09/01/19 1233  DME 3 n 1  Once        09/01/19 1232   09/01/19 1233  DME Bedside commode  Once       Question:  Patient needs a bedside commode to treat with the following condition  Answer:  Primary osteoarthritis of left hip   09/01/19 1232          Diagnostic Studies: DG Chest 2 View  Result Date: 08/22/2019 CLINICAL DATA:  Preoperative evaluation, hypertension, hyperlipidemia EXAM: CHEST - 2 VIEW COMPARISON:  10/26/2015 FINDINGS: Frontal and lateral views of the chest demonstrates stable dual lead pacemaker. Cardiac silhouette is unremarkable. No airspace disease, effusion, or pneumothorax. No acute bony abnormalities. IMPRESSION: 1. Stable exam, no acute process. Electronically Signed   By: Randa Ngo M.D.   On: 08/22/2019 22:09   DG C-Arm 1-60 Min-No Report  Result Date: 09/01/2019 Fluoroscopy was utilized by the requesting physician.  No radiographic interpretation.   ECHOCARDIOGRAM COMPLETE  Result Date: 08/25/2019    ECHOCARDIOGRAM REPORT   Patient Name:   Erik [name] Date of Exam: 08/21/2019 Medical Rec #:  [id number]       Height:       70.0 in Accession #:    [id number]      Weight:        260.0 lb Date of Birth:  10/24/1945        BSA:          2.334 m Patient Age:    14 years        BP:           100/60 mmHg Patient Gender: M               HR:           53 bpm. Exam Location:  [facility] Procedure: 2D Echo, Cardiac Doppler, Color Doppler and Strain Analysis Indications:    R06.02 Shortness of breath                 R06.09 Edema  History:        Patient has prior history of Echocardiogram examinations, most                 recent 11/10/2015. Nonobstructive CAD, Pacemaker,                 Arrythmias:Atrial Fibrillation and Tachy-brady syndrome,                 Signs/Symptoms:Lower extremity edeam. Dyspnea on exertion.                 Unexplained weight gain.; Risk Factors:Hypertension,                 Dyslipidemia and Obesity. Previous echo revealed LVEF 55%, LVH  and trivial MR.  Sonographer:    Lenard Galloway BA, RDCS Referring Phys: [place]  1. Left ventricular ejection fraction, by estimation, is 60 to 65%. The left ventricle has normal function. The left ventricle has no regional wall motion abnormalities. There is mild asymmetric left ventricular hypertrophy. Left ventricular diastolic parameters were normal. The average left ventricular global longitudinal strain is -19.0 %.  2. Right ventricular systolic function is normal. The right ventricular size is normal.  3. The mitral valve is normal in structure. Trivial mitral valve regurgitation. No evidence of mitral stenosis.  4. The aortic valve is grossly normal. Aortic valve regurgitation is not visualized. No aortic stenosis is present. FINDINGS  Left Ventricle: Left ventricular ejection fraction, by estimation, is 60 to 65%. The left ventricle has normal function. The left ventricle has no regional wall motion abnormalities. The average left ventricular global longitudinal strain is -19.0 %. The left ventricular internal cavity size was normal in size. There is mild asymmetric left ventricular  hypertrophy. Left ventricular diastolic parameters were normal. Right Ventricle: The right ventricular size is normal. No increase in right ventricular wall thickness. Right ventricular systolic function is normal. Left Atrium: Left atrial size was normal in size. Right Atrium: Right atrial size was normal in size. Pericardium: There is no evidence of pericardial effusion. Mitral Valve: The mitral valve is normal in structure. Trivial mitral valve regurgitation. No evidence of mitral valve stenosis. Tricuspid Valve: The tricuspid valve is normal in structure. Tricuspid valve regurgitation is trivial. No evidence of tricuspid stenosis. Aortic Valve: The aortic valve is grossly normal. Aortic valve regurgitation is not visualized. No aortic stenosis is present. There is mild calcification of the aortic valve. Pulmonic Valve: The pulmonic valve was normal in structure. Pulmonic valve regurgitation is not visualized. No evidence of pulmonic stenosis. Aorta: The aortic root and ascending aorta are structurally normal, with no evidence of dilitation. IAS/Shunts: The atrial septum is grossly normal.  LEFT VENTRICLE PLAX 2D LVIDd:         5.00 cm  Diastology LVIDs:         3.50 cm  LV e' lateral:   8.05 cm/s LV PW:         1.00 cm  LV E/e' lateral: 9.2 LV IVS:        1.20 cm  LV e' medial:    6.64 cm/s LVOT diam:     2.30 cm  LV E/e' medial:  11.2 LV SV:         68 LV SV Index:   29       2D Longitudinal Strain LVOT Area:     4.15 cm 2D Strain GLS (A2C):   -19.7 %                         2D Strain GLS (A3C):   -18.0 %                         2D Strain GLS (A4C):   -19.3 %                         2D Strain GLS Avg:     -19.0 % RIGHT VENTRICLE RV S prime:     13.80 cm/s TAPSE (M-mode): 3.4 cm LEFT ATRIUM             Index LA diam:  4.20 cm 1.80 cm/m LA Vol (A2C):   47.3 ml 20.27 ml/m LA Vol (A4C):   60.7 ml 26.01 ml/m LA Biplane Vol: 54.7 ml 23.44 ml/m  AORTIC VALVE LVOT Vmax:   79.00 cm/s LVOT Vmean:  54.200 cm/s  LVOT VTI:    0.163 m  AORTA Ao Root diam: 3.60 cm Ao Asc diam:  3.40 cm MITRAL VALVE MV Area (PHT):             SHUNTS MV Decel Time: 201 msec    Systemic VTI:  0.16 m MV E velocity: 74.10 cm/s  Systemic Diam: 2.30 cm MV A velocity: 44.60 cm/s MV E/A ratio:  1.66 Mertie Moores MD Electronically signed by Mertie Moores MD Signature Date/Time: 08/25/2019/10:43:49 AM    Final    DG HIP OPERATIVE UNILAT WITH PELVIS LEFT  Result Date: 09/01/2019 CLINICAL DATA:  Surgery, elective. Additional history provided: Left anterior hip replacement. Reported fluoroscopy time 21 seconds. EXAM: OPERATIVE left HIP (WITH PELVIS IF PERFORMED) 4 VIEWS TECHNIQUE: Fluoroscopic spot image(s) were submitted for interpretation post-operatively. COMPARISON:  Radiographs of the right hip with pelvis 03/28/2018 FINDINGS: Four intraoperative fluoroscopic images of the left hip are submitted. On the later images, there is sequela of left total hip arthroplasty. The femoral and acetabular components appear well seated. No unexpected finding. IMPRESSION: 4 intraoperative fluoroscopic images of the left hip as described. Electronically Signed   By: Kellie Simmering DO   On: 09/01/2019 09:25    Disposition: Discharge disposition: 01-Home or Self Care       Discharge Instructions    Call MD / Call 911   Complete by: As directed    If you experience chest pain or shortness of breath, CALL 911 and be transported to the hospital emergency room.  If you develope a fever above 101 F, pus (white drainage) or increased drainage or redness at the wound, or calf pain, call your surgeon's office.   Constipation Prevention   Complete by: As directed    Drink plenty of fluids.  Prune juice may be helpful.  You may use a stool softener, such as Colace (over the counter) 100 mg twice a day.  Use MiraLax (over the counter) for constipation as needed.   Diet - low sodium heart healthy   Complete by: As directed    Discharge instructions   Complete by:  As directed    INSTRUCTIONS AFTER JOINT REPLACEMENT   Remove items at home which could result in a fall. This includes throw rugs or furniture in walking pathways ICE to the affected joint every three hours while awake for 30 minutes at a time, for at least the first 3-5 days, and then as needed for pain and swelling.  Continue to use ice for pain and swelling. You may notice swelling that will progress down to the foot and ankle.  This is normal after surgery.  Elevate your leg when you are not up walking on it.   Continue to use the breathing machine you got in the hospital (incentive spirometer) which will help keep your temperature down.  It is common for your temperature to cycle up and down following surgery, especially at night when you are not up moving around and exerting yourself.  The breathing machine keeps your lungs expanded and your temperature down.   DIET:  As you were doing prior to hospitalization, we recommend a well-balanced diet.  DRESSING / WOUND CARE / SHOWERING  You may shower 3 days after surgery,  but keep the wounds dry during showering.  You may use an occlusive plastic wrap (Press'n Seal for example), NO SOAKING/SUBMERGING IN THE BATHTUB.  If the bandage gets wet, change with a clean dry gauze.  If the incision gets wet, pat the wound dry with a clean towel.  ACTIVITY  Increase activity slowly as tolerated, but follow the weight bearing instructions below.   No driving for 6 weeks or until further direction given by your physician.  You cannot drive while taking narcotics.  No lifting or carrying greater than 10 lbs. until further directed by your surgeon. Avoid periods of inactivity such as sitting longer than an hour when not asleep. This helps prevent blood clots.  You may return to work once you are authorized by your doctor.     WEIGHT BEARING   Weight bearing as tolerated with assist device (walker, cane, etc) as directed, use it as long as suggested by  your surgeon or therapist, typically at least 4-6 weeks.   EXERCISES  Results after joint replacement surgery are often greatly improved when you follow the exercise, range of motion and muscle strengthening exercises prescribed by your doctor. Safety measures are also important to protect the joint from further injury. Any time any of these exercises cause you to have increased pain or swelling, decrease what you are doing until you are comfortable again and then slowly increase them. If you have problems or questions, call your caregiver or physical therapist for advice.   Rehabilitation is important following a joint replacement. After just a few days of immobilization, the muscles of the leg can become weakened and shrink (atrophy).  These exercises are designed to build up the tone and strength of the thigh and leg muscles and to improve motion. Often times heat used for twenty to thirty minutes before working out will loosen up your tissues and help with improving the range of motion but do not use heat for the first two weeks following surgery (sometimes heat can increase post-operative swelling).   These exercises can be done on a training (exercise) mat, on the floor, on a table or on a bed. Use whatever works the best and is most comfortable for you.    Use music or television while you are exercising so that the exercises are a pleasant break in your day. This will make your life better with the exercises acting as a break in your routine that you can look forward to.   Perform all exercises about fifteen times, three times per day or as directed.  You should exercise both the operative leg and the other leg as well.  Exercises include:   Quad Sets - Tighten up the muscle on the front of the thigh (Quad) and hold for 5-10 seconds.   Straight Leg Raises - With your knee straight (if you were given a brace, keep it on), lift the leg to 60 degrees, hold for 3 seconds, and slowly lower the leg.   Perform this exercise against resistance later as your leg gets stronger.  Leg Slides: Lying on your back, slowly slide your foot toward your buttocks, bending your knee up off the floor (only go as far as is comfortable). Then slowly slide your foot back down until your leg is flat on the floor again.  Angel Wings: Lying on your back spread your legs to the side as far apart as you can without causing discomfort.  Hamstring Strength:  Lying on your back, push your  heel against the floor with your leg straight by tightening up the muscles of your buttocks.  Repeat, but this time bend your knee to a comfortable angle, and push your heel against the floor.  You may put a pillow under the heel to make it more comfortable if necessary.   A rehabilitation program following joint replacement surgery can speed recovery and prevent re-injury in the future due to weakened muscles. Contact your doctor or a physical therapist for more information on knee rehabilitation.    CONSTIPATION  Constipation is defined medically as fewer than three stools per week and severe constipation as less than one stool per week.  Even if you have a regular bowel pattern at home, your normal regimen is likely to be disrupted due to multiple reasons following surgery.  Combination of anesthesia, postoperative narcotics, change in appetite and fluid intake all can affect your bowels.   YOU MUST use at least one of the following options; they are listed in order of increasing strength to get the job done.  They are all available over the counter, and you may need to use some, POSSIBLY even all of these options:    Drink plenty of fluids (prune juice may be helpful) and high fiber foods Colace 100 mg by mouth twice a day  Senokot for constipation as directed and as needed Dulcolax (bisacodyl), take with full glass of water  Miralax (polyethylene glycol) once or twice a day as needed.  If you have tried all these things and are  unable to have a bowel movement in the first 3-4 days after surgery call either your surgeon or your primary doctor.    If you experience loose stools or diarrhea, hold the medications until you stool forms back up.  If your symptoms do not get better within 1 week or if they get worse, check with your doctor.  If you experience "the worst abdominal pain ever" or develop nausea or vomiting, please contact the office immediately for further recommendations for treatment.   ITCHING:  If you experience itching with your medications, try taking only a single pain pill, or even half a pain pill at a time.  You can also use Benadryl over the counter for itching or also to help with sleep.   TED HOSE STOCKINGS:  Use stockings on both legs until for at least 2 weeks or as directed by physician office. They may be removed at night for sleeping.  MEDICATIONS:  See your medication summary on the "After Visit Summary" that nursing will review with you.  You may have some home medications which will be placed on hold until you complete the course of blood thinner medication.  It is important for you to complete the blood thinner medication as prescribed.  PRECAUTIONS:  If you experience chest pain or shortness of breath - call 911 immediately for transfer to the hospital emergency department.   If you develop a fever greater that 101 F, purulent drainage from wound, increased redness or drainage from wound, foul odor from the wound/dressing, or calf pain - CONTACT YOUR SURGEON.                                                   FOLLOW-UP APPOINTMENTS:  If you do not already have a post-op appointment, please call the office for an  appointment to be seen by your surgeon.  Guidelines for how soon to be seen are listed in your "After Visit Summary", but are typically between 1-4 weeks after surgery.  OTHER INSTRUCTIONS:   Knee Replacement:  Do not place pillow under knee, focus on keeping the knee straight while  resting. CPM instructions: 0-90 degrees, 2 hours in the morning, 2 hours in the afternoon, and 2 hours in the evening. Place foam block, curve side up under heel at all times except when in CPM or when walking.  DO NOT modify, tear, cut, or change the foam block in any way.   DENTAL ANTIBIOTICS:  In most cases prophylactic antibiotics for Dental procdeures after total joint surgery are not necessary.  Exceptions are as follows:  1. History of prior total joint infection  2. Severely immunocompromised (Organ Transplant, cancer chemotherapy, Rheumatoid biologic meds such as [place])  3. Poorly controlled diabetes (A1C &gt; 8.0, blood glucose over 200)  If you have one of these conditions, contact your surgeon for an antibiotic prescription, prior to your dental procedure.   MAKE SURE YOU:  Understand these instructions.  Get help right away if you are not doing well or get worse.    Thank you for letting us be a part of your medical care team.  It is a privilege we respect greatly.  We hope these instructions will help you stay on track for a fast and full recovery!   Increase activity slowly as tolerated   Complete by: As directed        Follow-up Information    Melrose Nakayama, MD. Go on 09/11/2019.   Specialty: Orthopedic Surgery Why: Your appointment has been scheduled for 9:15  Contact information: [place] [id number] [phone]        [facility] Go on 09/04/2019.   Why: You are scheduled to start Outpatient physical therapy at 1:00. Please arrive at 12:30 to complete your paperwork  Contact information: [facility] [place] [address] [phone]                Signed: Larwance Sachs Yolande Skoda 09/02/2019, 8:05 AM

## 2019-09-08 ENCOUNTER — Encounter: Payer: Medicare Other | Admitting: Cardiology

## 2019-09-09 DIAGNOSIS — M25652 Stiffness of left hip, not elsewhere classified: Secondary | ICD-10-CM | POA: Diagnosis not present

## 2019-09-09 DIAGNOSIS — Z96642 Presence of left artificial hip joint: Secondary | ICD-10-CM | POA: Diagnosis not present

## 2019-09-11 DIAGNOSIS — Z96642 Presence of left artificial hip joint: Secondary | ICD-10-CM | POA: Diagnosis not present

## 2019-09-11 DIAGNOSIS — M25652 Stiffness of left hip, not elsewhere classified: Secondary | ICD-10-CM | POA: Diagnosis not present

## 2019-09-14 DIAGNOSIS — M25652 Stiffness of left hip, not elsewhere classified: Secondary | ICD-10-CM | POA: Diagnosis not present

## 2019-09-14 DIAGNOSIS — Z96642 Presence of left artificial hip joint: Secondary | ICD-10-CM | POA: Diagnosis not present

## 2019-09-16 DIAGNOSIS — M25652 Stiffness of left hip, not elsewhere classified: Secondary | ICD-10-CM | POA: Diagnosis not present

## 2019-09-16 DIAGNOSIS — Z96642 Presence of left artificial hip joint: Secondary | ICD-10-CM | POA: Diagnosis not present

## 2019-09-17 ENCOUNTER — Other Ambulatory Visit: Payer: Self-pay | Admitting: Family Medicine

## 2019-09-18 DIAGNOSIS — Z96642 Presence of left artificial hip joint: Secondary | ICD-10-CM | POA: Diagnosis not present

## 2019-09-18 DIAGNOSIS — M25652 Stiffness of left hip, not elsewhere classified: Secondary | ICD-10-CM | POA: Diagnosis not present

## 2019-09-18 NOTE — Telephone Encounter (Signed)
Patient need to schedule an ov for more refills. 

## 2019-09-21 ENCOUNTER — Telehealth: Payer: Self-pay | Admitting: Cardiology

## 2019-09-21 DIAGNOSIS — Z96642 Presence of left artificial hip joint: Secondary | ICD-10-CM | POA: Diagnosis not present

## 2019-09-21 DIAGNOSIS — M25652 Stiffness of left hip, not elsewhere classified: Secondary | ICD-10-CM | POA: Diagnosis not present

## 2019-09-21 MED ORDER — FUROSEMIDE 20 MG PO TABS: 20.0000 mg | ORAL_TABLET | Freq: Every day | ORAL | 1 refills | Status: DC

## 2019-09-21 NOTE — Telephone Encounter (Signed)
[name] the patient's wife is calling requesting to speak with Sherri in regards to his Lasix. Please advise.

## 2019-09-21 NOTE — Telephone Encounter (Signed)
Returned call to wife His feet are better as long as he takes his Lasix, but his Rx has expired. Wife asking for new Rx to be sent. New Rx sent per request. Wife appreciates my return call.

## 2019-09-22 ENCOUNTER — Ambulatory Visit (INDEPENDENT_AMBULATORY_CARE_PROVIDER_SITE_OTHER): Payer: Medicare Other | Admitting: *Deleted

## 2019-09-22 DIAGNOSIS — I495 Sick sinus syndrome: Secondary | ICD-10-CM

## 2019-09-23 DIAGNOSIS — M25652 Stiffness of left hip, not elsewhere classified: Secondary | ICD-10-CM | POA: Diagnosis not present

## 2019-09-23 DIAGNOSIS — Z96642 Presence of left artificial hip joint: Secondary | ICD-10-CM | POA: Diagnosis not present

## 2019-09-23 LAB — CUP PACEART REMOTE DEVICE CHECK
Battery Remaining Longevity: 119 mo
Battery Remaining Percentage: 95.5 %
Battery Voltage: 2.99 V
Brady Statistic AP VP Percent: 1 %
Brady Statistic AP VS Percent: 46 %
Brady Statistic AS VP Percent: 1 %
Brady Statistic AS VS Percent: 53 %
Brady Statistic RA Percent Paced: 46 %
Brady Statistic RV Percent Paced: 1 %
Date Time Interrogation Session: 20210728022747
Implantable Lead Implant Date: 20170501
Implantable Lead Implant Date: 20170501
Implantable Lead Location: 753859
Implantable Lead Location: 753860
Implantable Pulse Generator Implant Date: 20170501
Lead Channel Impedance Value: 400 Ohm
Lead Channel Impedance Value: 530 Ohm
Lead Channel Pacing Threshold Amplitude: 0.75 V
Lead Channel Pacing Threshold Amplitude: 0.875 V
Lead Channel Pacing Threshold Pulse Width: 0.5 ms
Lead Channel Pacing Threshold Pulse Width: 0.5 ms
Lead Channel Sensing Intrinsic Amplitude: 12 mV
Lead Channel Sensing Intrinsic Amplitude: 3.7 mV
Lead Channel Setting Pacing Amplitude: 1.875
Lead Channel Setting Pacing Amplitude: 2.5 V
Lead Channel Setting Pacing Pulse Width: 0.5 ms
Lead Channel Setting Sensing Sensitivity: 2 mV
Pulse Gen Model: 2272
Pulse Gen Serial Number: 7897836

## 2019-09-25 ENCOUNTER — Other Ambulatory Visit: Payer: Self-pay | Admitting: Family Medicine

## 2019-09-25 DIAGNOSIS — Z96642 Presence of left artificial hip joint: Secondary | ICD-10-CM | POA: Diagnosis not present

## 2019-09-25 DIAGNOSIS — M25652 Stiffness of left hip, not elsewhere classified: Secondary | ICD-10-CM | POA: Diagnosis not present

## 2019-09-25 NOTE — Telephone Encounter (Signed)
Please advise. rx is not on the current med list.

## 2019-09-25 NOTE — Progress Notes (Signed)
Remote pacemaker transmission.  ° °

## 2019-09-27 HISTORY — PX: SKIN CANCER EXCISION: SHX779

## 2019-09-28 DIAGNOSIS — M25652 Stiffness of left hip, not elsewhere classified: Secondary | ICD-10-CM | POA: Diagnosis not present

## 2019-09-28 DIAGNOSIS — Z96642 Presence of left artificial hip joint: Secondary | ICD-10-CM | POA: Diagnosis not present

## 2019-09-30 DIAGNOSIS — M25652 Stiffness of left hip, not elsewhere classified: Secondary | ICD-10-CM | POA: Diagnosis not present

## 2019-09-30 DIAGNOSIS — Z96642 Presence of left artificial hip joint: Secondary | ICD-10-CM | POA: Diagnosis not present

## 2019-10-05 DIAGNOSIS — Z96642 Presence of left artificial hip joint: Secondary | ICD-10-CM | POA: Diagnosis not present

## 2019-10-05 DIAGNOSIS — M25652 Stiffness of left hip, not elsewhere classified: Secondary | ICD-10-CM | POA: Diagnosis not present

## 2019-10-07 DIAGNOSIS — M25652 Stiffness of left hip, not elsewhere classified: Secondary | ICD-10-CM | POA: Diagnosis not present

## 2019-10-07 DIAGNOSIS — Z96642 Presence of left artificial hip joint: Secondary | ICD-10-CM | POA: Diagnosis not present

## 2019-10-08 DIAGNOSIS — C44329 Squamous cell carcinoma of skin of other parts of face: Secondary | ICD-10-CM | POA: Diagnosis not present

## 2019-10-12 DIAGNOSIS — Z96642 Presence of left artificial hip joint: Secondary | ICD-10-CM | POA: Diagnosis not present

## 2019-10-12 DIAGNOSIS — M25652 Stiffness of left hip, not elsewhere classified: Secondary | ICD-10-CM | POA: Diagnosis not present

## 2019-10-15 DIAGNOSIS — Z96642 Presence of left artificial hip joint: Secondary | ICD-10-CM | POA: Diagnosis not present

## 2019-10-15 DIAGNOSIS — M25652 Stiffness of left hip, not elsewhere classified: Secondary | ICD-10-CM | POA: Diagnosis not present

## 2019-10-19 DIAGNOSIS — M25652 Stiffness of left hip, not elsewhere classified: Secondary | ICD-10-CM | POA: Diagnosis not present

## 2019-10-19 DIAGNOSIS — Z96642 Presence of left artificial hip joint: Secondary | ICD-10-CM | POA: Diagnosis not present

## 2019-10-22 DIAGNOSIS — Z96642 Presence of left artificial hip joint: Secondary | ICD-10-CM | POA: Diagnosis not present

## 2019-10-22 DIAGNOSIS — M25652 Stiffness of left hip, not elsewhere classified: Secondary | ICD-10-CM | POA: Diagnosis not present

## 2019-10-25 ENCOUNTER — Other Ambulatory Visit: Payer: Self-pay | Admitting: Cardiology

## 2019-10-28 ENCOUNTER — Other Ambulatory Visit: Payer: Self-pay | Admitting: Cardiology

## 2019-10-28 ENCOUNTER — Other Ambulatory Visit: Payer: Self-pay

## 2019-10-29 DIAGNOSIS — M25652 Stiffness of left hip, not elsewhere classified: Secondary | ICD-10-CM | POA: Diagnosis not present

## 2019-10-29 DIAGNOSIS — Z96642 Presence of left artificial hip joint: Secondary | ICD-10-CM | POA: Diagnosis not present

## 2019-10-30 ENCOUNTER — Other Ambulatory Visit: Payer: Self-pay

## 2019-10-30 ENCOUNTER — Other Ambulatory Visit: Payer: Self-pay | Admitting: Cardiology

## 2019-10-30 ENCOUNTER — Telehealth: Payer: Self-pay | Admitting: Cardiology

## 2019-10-30 MED ORDER — POTASSIUM CHLORIDE ER 10 MEQ PO TBCR: EXTENDED_RELEASE_TABLET | ORAL | 9 refills | Status: DC

## 2019-10-30 NOTE — Telephone Encounter (Signed)
Called pt and spoke with pt's wife to informed them that pt's medication was sent to requested pharmacy on 10/27/19 with refills and that they needed to call the pharmacy and speak with a live person, instead of the automatic system. Wife verbalized understanding.

## 2019-10-30 NOTE — Telephone Encounter (Signed)
*  STAT* If patient is at the pharmacy, call can be transferred to refill team.   1. Which medications need to be refilled? (please list name of each medication and dose if known) potassium chloride (KLOR-CON) 10 MEQ tablet  2. Which pharmacy/location (including street and city if local pharmacy) is medication to be sent to? [id number] - [place], [place] - [address] AT [facility] [address]  3. Do they need a 30 day or 90 day supply? 90 day supply

## 2019-11-04 DIAGNOSIS — M25652 Stiffness of left hip, not elsewhere classified: Secondary | ICD-10-CM | POA: Diagnosis not present

## 2019-11-04 DIAGNOSIS — Z96642 Presence of left artificial hip joint: Secondary | ICD-10-CM | POA: Diagnosis not present

## 2019-11-12 ENCOUNTER — Other Ambulatory Visit: Payer: Self-pay

## 2019-11-13 ENCOUNTER — Ambulatory Visit (INDEPENDENT_AMBULATORY_CARE_PROVIDER_SITE_OTHER): Payer: Medicare Other | Admitting: Family Medicine

## 2019-11-13 ENCOUNTER — Encounter: Payer: Self-pay | Admitting: Family Medicine

## 2019-11-13 VITALS — BP 130/70 | HR 67 | Temp 98.1°F | Ht 70.0 in | Wt 257.6 lb

## 2019-11-13 DIAGNOSIS — M25471 Effusion, right ankle: Secondary | ICD-10-CM | POA: Diagnosis not present

## 2019-11-13 DIAGNOSIS — M25472 Effusion, left ankle: Secondary | ICD-10-CM

## 2019-11-13 DIAGNOSIS — I2510 Atherosclerotic heart disease of native coronary artery without angina pectoris: Secondary | ICD-10-CM

## 2019-11-13 DIAGNOSIS — M1611 Unilateral primary osteoarthritis, right hip: Secondary | ICD-10-CM

## 2019-11-13 MED ORDER — TRAMADOL HCL 50 MG PO TABS: 100.0000 mg | ORAL_TABLET | Freq: Four times a day (QID) | ORAL | 0 refills | Status: DC | PRN

## 2019-11-13 MED ORDER — PREDNISONE 10 MG PO TABS: 10.0000 mg | ORAL_TABLET | Freq: Two times a day (BID) | ORAL | 2 refills | Status: DC

## 2019-11-13 MED ORDER — FUROSEMIDE 40 MG PO TABS: 40.0000 mg | ORAL_TABLET | Freq: Every day | ORAL | 3 refills | Status: DC

## 2019-11-13 NOTE — Progress Notes (Signed)
   Subjective:    Patient ID: Erik [name], male    DOB: 07-17-1945, 74 y.o.   MRN: [id number]  HPI Here for several issues. First his feet and ankles have been swelling a bit more lately, despite wearing compression stockings most days. He takes Lasix 20 mg daily. No SOB. Also he has been having seere right hip pain. 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 Latanya Maudlin told him at that time that the right hip will also need to be replaced soon.    Review of Systems  Constitutional: Negative.   Respiratory: Negative.   Cardiovascular: Positive for leg swelling. Negative for chest pain and palpitations.  Musculoskeletal: Positive for arthralgias.       Objective:   Physical Exam Constitutional:      Appearance: Normal appearance.  Cardiovascular:     Rate and Rhythm: Normal rate and regular rhythm.     Pulses: Normal pulses.     Heart sounds: Normal heart sounds.  Pulmonary:     Effort: Pulmonary effort is normal.     Breath sounds: Normal breath sounds.  Musculoskeletal:     Comments: 2+ edema in both ankles   Neurological:     Mental Status: He is alert.           Assessment & Plan:  Ankle edema, we will increase the Lasix to 40 mg daily. For the hip pain, we will start him on Prednisone 10 mg BID. We plan to get him off this prior to any hip surgery of course. Add Tramadol for pain.  [name], MD

## 2019-11-16 DIAGNOSIS — M25652 Stiffness of left hip, not elsewhere classified: Secondary | ICD-10-CM | POA: Diagnosis not present

## 2019-11-16 DIAGNOSIS — Z96642 Presence of left artificial hip joint: Secondary | ICD-10-CM | POA: Diagnosis not present

## 2019-11-23 DIAGNOSIS — Z23 Encounter for immunization: Secondary | ICD-10-CM | POA: Diagnosis not present

## 2019-11-23 DIAGNOSIS — M25652 Stiffness of left hip, not elsewhere classified: Secondary | ICD-10-CM | POA: Diagnosis not present

## 2019-11-23 DIAGNOSIS — Z96642 Presence of left artificial hip joint: Secondary | ICD-10-CM | POA: Diagnosis not present

## 2019-11-25 ENCOUNTER — Ambulatory Visit: Payer: Medicare Other | Admitting: Cardiovascular Disease

## 2019-11-25 DIAGNOSIS — Z471 Aftercare following joint replacement surgery: Secondary | ICD-10-CM | POA: Diagnosis not present

## 2019-11-25 DIAGNOSIS — Z96642 Presence of left artificial hip joint: Secondary | ICD-10-CM | POA: Diagnosis not present

## 2019-11-26 DIAGNOSIS — Z23 Encounter for immunization: Secondary | ICD-10-CM | POA: Diagnosis not present

## 2019-11-28 ENCOUNTER — Other Ambulatory Visit: Payer: Self-pay | Admitting: Cardiology

## 2019-11-30 NOTE — Telephone Encounter (Deleted)
Prescription refill request for Xarelto received.   Last office visit: Erik [name] 08/26/2019 Weight: 116.8 Age: Scr: CrCl:

## 2019-11-30 NOTE — Telephone Encounter (Signed)
Xarelto 20mg  refill request received. Pt is 74 years old, weight-116.8kg, Crea-1.32 on 08/21/2019, last seen by Cadence Furth NP on 08/26/19 and to follow up with primary cardiologist Dr. [name], Diagnosis-Afib, [place].44ml/min; Dose is appropriate based on dosing criteria. Will send in refill to requested pharmacy.

## 2019-12-09 ENCOUNTER — Other Ambulatory Visit: Payer: Self-pay | Admitting: Family Medicine

## 2019-12-22 ENCOUNTER — Ambulatory Visit (INDEPENDENT_AMBULATORY_CARE_PROVIDER_SITE_OTHER): Payer: Medicare Other

## 2019-12-22 DIAGNOSIS — R001 Bradycardia, unspecified: Secondary | ICD-10-CM | POA: Diagnosis not present

## 2019-12-22 LAB — CUP PACEART REMOTE DEVICE CHECK
Battery Remaining Longevity: 119 mo
Battery Remaining Percentage: 95.5 %
Battery Voltage: 2.99 V
Brady Statistic AP VP Percent: 1 %
Brady Statistic AP VS Percent: 52 %
Brady Statistic AS VP Percent: 1 %
Brady Statistic AS VS Percent: 47 %
Brady Statistic RA Percent Paced: 52 %
Brady Statistic RV Percent Paced: 1 %
Date Time Interrogation Session: 20211026050452
Implantable Lead Implant Date: 20170501
Implantable Lead Implant Date: 20170501
Implantable Lead Location: 753859
Implantable Lead Location: 753860
Implantable Pulse Generator Implant Date: 20170501
Lead Channel Impedance Value: 410 Ohm
Lead Channel Impedance Value: 550 Ohm
Lead Channel Pacing Threshold Amplitude: 0.75 V
Lead Channel Pacing Threshold Amplitude: 1 V
Lead Channel Pacing Threshold Pulse Width: 0.5 ms
Lead Channel Pacing Threshold Pulse Width: 0.5 ms
Lead Channel Sensing Intrinsic Amplitude: 11.4 mV
Lead Channel Sensing Intrinsic Amplitude: 3.8 mV
Lead Channel Setting Pacing Amplitude: 2 V
Lead Channel Setting Pacing Amplitude: 2.5 V
Lead Channel Setting Pacing Pulse Width: 0.5 ms
Lead Channel Setting Sensing Sensitivity: 2 mV
Pulse Gen Model: 2272
Pulse Gen Serial Number: 7897836

## 2019-12-25 NOTE — Progress Notes (Signed)
Remote pacemaker transmission.   

## 2020-01-06 ENCOUNTER — Other Ambulatory Visit: Payer: Self-pay

## 2020-01-06 ENCOUNTER — Encounter: Payer: Self-pay | Admitting: Cardiovascular Disease

## 2020-01-06 ENCOUNTER — Ambulatory Visit (INDEPENDENT_AMBULATORY_CARE_PROVIDER_SITE_OTHER): Payer: Medicare Other | Admitting: Cardiovascular Disease

## 2020-01-06 DIAGNOSIS — E782 Mixed hyperlipidemia: Secondary | ICD-10-CM

## 2020-01-06 DIAGNOSIS — M25472 Effusion, left ankle: Secondary | ICD-10-CM | POA: Diagnosis not present

## 2020-01-06 DIAGNOSIS — I4819 Other persistent atrial fibrillation: Secondary | ICD-10-CM | POA: Diagnosis not present

## 2020-01-06 DIAGNOSIS — I2510 Atherosclerotic heart disease of native coronary artery without angina pectoris: Secondary | ICD-10-CM

## 2020-01-06 DIAGNOSIS — I10 Essential (primary) hypertension: Secondary | ICD-10-CM | POA: Diagnosis not present

## 2020-01-06 DIAGNOSIS — M25471 Effusion, right ankle: Secondary | ICD-10-CM | POA: Diagnosis not present

## 2020-01-06 NOTE — Assessment & Plan Note (Signed)
History of cardiac catheterization performed over 15 years ago by Dr. Johnsie Cancel revealing nonobstructive CAD.  He is completely asymptomatic.  His last Myoview performed 06/03/2014 was nonischemic

## 2020-01-06 NOTE — Assessment & Plan Note (Signed)
History of essential hypertension with blood pressure measured today 132/72.  He is on diltiazem, losartan and hydrochlorothiazide.

## 2020-01-06 NOTE — Assessment & Plan Note (Signed)
History of hyperlipidemia on statin therapy with lipid profile performed 05/06/2019 revealing total cholesterol 165, LDL of 84 and HDL of 52.

## 2020-01-06 NOTE — Assessment & Plan Note (Signed)
Bilateral lower extremity edema probably related to chronic prednisone use better on oral diuretics.  He denies shortness of breath.  He has minimal edema today.

## 2020-01-06 NOTE — Assessment & Plan Note (Signed)
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  He then had A. fib ablation by Dr. Curt Bears 05/17/2017 and has had no return of his A. fib.  He has 1% A. fib burden on remote monitoring.  He remains on Xarelto oral anticoagulation.

## 2020-01-06 NOTE — Patient Instructions (Addendum)
Medication Instructions:  Continue current medications  *If you need a refill on your cardiac medications before your next appointment, please call your pharmacy*   Lab Work: None Ordered   Testing/Procedures: None Ordered   Follow-Up: At [facility], you and your health needs are our priority.  As part of our continuing mission to provide you with exceptional heart care, we have created designated Provider Care Teams.  These Care Teams include your primary Cardiologist (physician) and Advanced Practice Providers (APPs -  Physician Assistants and Nurse Practitioners) who all work together to provide you with the care you need, when you need it.  We recommend signing up for the patient portal called "MyChart".  Sign up information is provided on this After Visit Summary.  MyChart is used to connect with patients for Virtual Visits (Telemedicine).  Patients are able to view lab/test results, encounter notes, upcoming appointments, etc.  Non-urgent messages can be sent to your provider as well.   To learn more about what you can do with MyChart, go to [email or web address].    Your next appointment:   1 year(s)  The format for your next appointment:   In Person  Provider:   You may see Quay Burow, MD or one of the following Advanced Practice Providers on your designated Care Team:    Kerin Ransom, PA-C  [place], [place]  Coletta Memos, [place]     Obstruction

## 2020-01-06 NOTE — Progress Notes (Signed)
01/06/2020 Erik [name]   15-Oct-1945  [id number]  Primary Physician [name], MD Primary Cardiologist: Lorretta Harp MD FACP, [place], [place], [place]  HPI:  Erik [name] is a 74 y.o.  mild to moderately overweight married [place] retired in 2016.  She accompanies him today.  I last saw him in the office 06/09/14.  He and his wife moved down from [place] near [place] over 30 years ago.  His cardiovascular risk factor profile notable for treated hypertension and hyperlipidemia. He apparently had a negative cath 10 years ago by Dr. Johnsie Cancel in for chest pain. He has not felt well for the last month complaining of increasing dyspnea on exertion. He recently took a trip to [place] [employer] and drank 30 shots within 16 hours. On the way back in the airplane he developed chest pain and was seen when he landed at [facility] emergency room where he was found to be in A. Fib with RVR with a heart rate of 170. His heart rate was slowed with IV diltiazem and then he was given procainamide and converted to sinus rhythm. His troponins were negative. He felt clinically improved. A TSH was normal. Today the office he is in A. Fib with a heart rate of 112. I'm going to add low-dose beta blocker (Lopressor 25 mg by mouth twice a day), Eliquis obtain a 2-D echo and from left Myoview stress test. We will also check a basic metabolic panel. When I saw him back in follow-up on 06/09/14 he had converted to sinus rhythm. He complained of increasing shortness of breath over the last several months. He is in A. Fib with ventricular response of 112 today. There are concerns about the cost of Eloquis which will need to be addressed. I'm going to get a 30 day event monitor to assess whether or not he is in an out of A. Fib or has chronic A. Fib at this point and will after that base decision regarding cardioversion versus antiarrhythmic medications.  He had a permanent transvenous pacemaker placed by Dr. Lovena Le  06/27/2015 for tachybradycardia syndrome.  Ultimately he had A. fib ablation by Dr. Curt Bears 05/17/2017 which was successful.  Remote monitoring shows 1% A. fib burden.  He is on Xarelto oral anticoagulation.  He denies chest pain or shortness of breath.  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  He is on chronic prednisone therapy which is resulted in lower extremity edema which he is on diuretics.  He did not have obstructive sleep apnea in the past but could not tolerate his CPAP.  Since losing weight he has less apneic episodes according to his wife.   Current Meds  Medication Sig  . acetaminophen (TYLENOL) 500 MG tablet Take 1,000 mg by mouth every 6 (six) hours as needed for moderate pain.   [name] diltiazem (CARDIZEM CD) 180 MG 24 hr capsule Take 1 capsule (180 mg total) by mouth daily.  . furosemide (LASIX) 40 MG tablet Take 1 tablet (40 mg total) by mouth daily.  [name] gabapentin (NEURONTIN) 600 MG tablet Take 1 tablet (600 mg total) by mouth 3 (three) times daily.  . hydrochlorothiazide (HYDRODIURIL) 25 MG tablet TAKE 1 TABLET(25 MG) BY MOUTH DAILY  . levothyroxine (SYNTHROID) 50 MCG tablet TAKE 1 TABLET BY MOUTH EVERY DAY  . losartan (COZAAR) 50 MG tablet TAKE 1 TABLET(50 MG) BY MOUTH DAILY  . potassium chloride (KLOR-CON)  10 MEQ tablet TAKE 1 TABLET(10 MEQ) BY MOUTH DAILY  . predniSONE (DELTASONE) 10 MG tablet Take 1 tablet (10 mg total) by mouth 2 (two) times daily with a meal.  . simvastatin (ZOCOR) 20 MG tablet TAKE 1 TABLET BY MOUTH EVERY DAY  . tamsulosin (FLOMAX) 0.4 MG CAPS capsule TAKE ONE CAPSULE BY MOUTH EVERY DAY  . tiZANidine (ZANAFLEX) 4 MG tablet Take 1 tablet (4 mg total) by mouth every 6 (six) hours as needed for muscle spasms.  . traMADol (ULTRAM) 50 MG tablet Take 2 tablets (100 mg total) by mouth every 6 (six) hours as needed for moderate pain.  [name] XARELTO 20 MG TABS tablet TAKE 1 TABLET BY MOUTH DAILY WITH SUPPER      No Known Allergies  Social History   Socioeconomic History  . Marital status: Married    Spouse name: Not on file  . Number of children: 1  . Years of education: Not on file  . Highest education level: Not on file  Occupational History  . Occupation: retired  Tobacco Use  . Smoking status: Former Smoker    Packs/day: 2.00    Years: 28.00    Pack years: 56.00    Types: Cigarettes    Quit date: 02/27/1995    Years since quitting: 24.8  . Smokeless tobacco: Never Used  Vaping Use  . Vaping Use: Never used  Substance and Sexual Activity  . Alcohol use: Not Currently    Alcohol/week: 0.0 standard drinks    Comment: couple times a month  . Drug use: No  . Sexual activity: Not Currently    Partners: Female  Other Topics Concern  . Not on file  Social History Narrative   He is married and retired he has 1 child   Social Determinants of Radio broadcast assistant Strain:   . Difficulty of Paying Living Expenses: Not on file  Food Insecurity:   . Worried About Charity fundraiser in the Last Year: Not on file  . Ran Out of Food in the Last Year: Not on file  Transportation Needs:   . Lack of Transportation (Medical): Not on file  . Lack of Transportation (Non-Medical): Not on file  Physical Activity:   . Days of Exercise per Week: Not on file  . Minutes of Exercise per Session: Not on file  Stress:   . Feeling of Stress : Not on file  Social Connections:   . Frequency of Communication with Friends and Family: Not on file  . Frequency of Social Gatherings with Friends and Family: Not on file  . Attends Religious Services: Not on file  . Active Member of Clubs or Organizations: Not on file  . Attends Archivist Meetings: Not on file  . Marital Status: Not on file  Intimate Partner Violence:   . Fear of Current or Ex-Partner: Not on file  . Emotionally Abused: Not on file  . Physically Abused: Not on file  . Sexually Abused: Not on file     Review of  Systems: General: negative for chills, fever, night sweats or weight changes.  Cardiovascular: negative for chest pain, dyspnea on exertion, edema, orthopnea, palpitations, paroxysmal nocturnal dyspnea or shortness of breath Dermatological: negative for rash Respiratory: negative for cough or wheezing Urologic: negative for hematuria Abdominal: negative for nausea, vomiting, diarrhea, bright red blood per rectum, melena, or hematemesis Neurologic: negative for visual changes, syncope, or dizziness All other systems reviewed and are otherwise negative except  as noted above.    Blood pressure 132/72, pulse 74, height 5\' 10"  (1.778 m), weight 253 lb (114.8 kg).  General appearance: alert and no distress Neck: no adenopathy, no carotid bruit, no JVD, supple, symmetrical, trachea midline and thyroid not enlarged, symmetric, no tenderness/mass/nodules Lungs: clear to auscultation bilaterally Heart: regular rate and rhythm, S1, S2 normal, no murmur, click, rub or gallop Extremities: extremities normal, atraumatic, no cyanosis or edema Pulses: 2+ and symmetric Skin: Skin color, texture, turgor normal. No rashes or lesions Neurologic: Alert and oriented X 3, normal strength and tone. Normal symmetric reflexes. Normal coordination and gait  EKG not performed today  ASSESSMENT AND PLAN:   Hyperlipidemia History of hyperlipidemia on statin therapy with lipid profile performed 05/06/2019 revealing total cholesterol 165, LDL of 84 and HDL of 52.  Essential hypertension History of essential hypertension with blood pressure measured today 132/72.  He is on diltiazem, losartan and hydrochlorothiazide.  Non-obstructive CAD History of cardiac catheterization performed over 15 years ago by Dr. Johnsie Cancel revealing nonobstructive CAD.  He is completely asymptomatic.  His last Myoview performed 06/03/2014 was nonischemic  Persistent atrial fibrillation (HCC) History of persistent A. fib status post permanent  transvenous pacemaker insertion by Dr. 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  He then had A. fib ablation by Dr. Curt Bears 05/17/2017 and has had no return of his A. fib.  He has 1% A. fib burden on remote monitoring.  He remains on Xarelto oral anticoagulation.  Ankle edema, bilateral Bilateral lower extremity edema probably related to chronic prednisone use better on oral diuretics.  He denies shortness of breath.  He has minimal edema today.      Lorretta Harp MD FACP,FACC,FAHA, [facility] 01/06/2020 3:52 PM

## 2020-01-09 ENCOUNTER — Other Ambulatory Visit: Payer: Self-pay | Admitting: Family Medicine

## 2020-01-18 ENCOUNTER — Encounter: Payer: Self-pay | Admitting: Cardiology

## 2020-01-18 ENCOUNTER — Other Ambulatory Visit: Payer: Self-pay

## 2020-01-18 ENCOUNTER — Ambulatory Visit (INDEPENDENT_AMBULATORY_CARE_PROVIDER_SITE_OTHER): Payer: Medicare Other | Admitting: Cardiology

## 2020-01-18 DIAGNOSIS — I4819 Other persistent atrial fibrillation: Secondary | ICD-10-CM

## 2020-01-18 DIAGNOSIS — Z950 Presence of cardiac pacemaker: Secondary | ICD-10-CM

## 2020-01-18 DIAGNOSIS — I2510 Atherosclerotic heart disease of native coronary artery without angina pectoris: Secondary | ICD-10-CM

## 2020-01-18 LAB — CUP PACEART INCLINIC DEVICE CHECK
Battery Remaining Longevity: 128 mo
Battery Voltage: 2.98 V
Brady Statistic RA Percent Paced: 54 %
Brady Statistic RV Percent Paced: 0.78 %
Date Time Interrogation Session: 20211122162700
Implantable Lead Implant Date: 20170501
Implantable Lead Implant Date: 20170501
Implantable Lead Location: 753859
Implantable Lead Location: 753860
Implantable Pulse Generator Implant Date: 20170501
Lead Channel Impedance Value: 425 Ohm
Lead Channel Impedance Value: 550 Ohm
Lead Channel Pacing Threshold Amplitude: 0.75 V
Lead Channel Pacing Threshold Amplitude: 1 V
Lead Channel Pacing Threshold Pulse Width: 0.5 ms
Lead Channel Pacing Threshold Pulse Width: 0.5 ms
Lead Channel Sensing Intrinsic Amplitude: 10.3 mV
Lead Channel Sensing Intrinsic Amplitude: 3.6 mV
Lead Channel Setting Pacing Amplitude: 2 V
Lead Channel Setting Pacing Amplitude: 2.5 V
Lead Channel Setting Pacing Pulse Width: 0.5 ms
Lead Channel Setting Sensing Sensitivity: 2 mV
Pulse Gen Model: 2272
Pulse Gen Serial Number: 7897836

## 2020-01-18 NOTE — Progress Notes (Signed)
Electrophysiology Office Note   Date:  01/18/2020   ID:  Erik [name], DOB 26-Oct-1945, MRN [id number]  PCP:  [name], MD  Cardiologist:  [name] Primary Electrophysiologist:  Constance Haw, MD    No chief complaint on file.    History of Present Illness: Erik [name] is a 74 y.o. male who presents today for electrophysiology evaluation.     He has a history of atrial fibrillation, nonobstructive coronary disease, hypertension, hyperlipidemia, hypothyroidism.  March 2016 he was diagnosed with atrial fibrillation rapid rates in the setting of chest pain.  He was loaded on dofetilide but this was stopped as he was back into atrial fibrillation.  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  He had AF ablation 05/17/2017.  Today, denies symptoms of palpitations, chest pain, shortness of breath, orthopnea, PND, lower extremity edema, claudication, dizziness, presyncope, syncope, bleeding, or neurologic sequela. The patient is tolerating medications without difficulties.  He is currently feeling well.  He has no chest pain or shortness of breath.  He is unaware of any arrhythmias.  He had a left hip replacement last summer as well as had shingles.  He has done well since then and is without complaints.  He is planning to have his right hip replaced early next year.  Past Medical History:  Diagnosis Date  . Anxiety   . Arthritis   . Atypical chest pain 06/26/2015  . BPH with urinary obstruction   . Carotid disease, bilateral ([place])    a. 09/2014 Carotid U/S: 1-39% bilat ICA stenosis.  . Chronic lower back pain   . CKD (chronic kidney disease), stage III ([place])    pt. denies  . Diverticulitis 12/2016  . Diverticulosis   . Dyspnea    at rest and exertion  . Gout    "couple days/year" (05/17/2017)  . History of kidney stones   . Hyperlipidemia   . Hypertension   . Hypertensive heart disease   . Hypothyroidism   . Internal  hemorrhoids   . Long QT interval 06/26/2015  . Nephrolithiasis   . Neuropathy 07/30/2017  . Non-obstructive CAD    a. 07/2002 Cath: LM 20, LAD 39m/d, LCX 50-81m, OM1 52m, RCA 28m, EF 60%; b. 05/2014 MV: low risk w/ small sized, mild intensity rev defect in apical/inferior/infsept area, nl EF->Med Rx.  . Panic attack   . Paroxysmal atrial fibrillation ([place])    a. Dx 04/2014; b. 05/2014 Echo: Ef 60-65%, no rwma, triv MR/TR, nl RV;  c. CHA2DS2VASc = 3-->was on eliquis, switched to xarelto 04/2015 2/2 cost; d. 05/2015 Tikosyn loaded w/ conversion to AF; e. 06/2015 QTc prolongation and bradycardia->tikosyn d/c'd, PPM placed, Amio started; f. 07/01/2015 In Aflutter @ clinic f/u.  [name] Paroxysmal atrial flutter ([place])    a. 06/2015 noted to be in rapid Aflutter in Afib clinic-->amio load continued.  . Persistent atrial fibrillation ([place]) 06/21/2015  . Presence of permanent cardiac pacemaker    St. Jude  . Prolapsed internal hemorrhoids, grade 2-3 10/19/2016   LL Gr 3 and RP/RA Gr 2 LL banded 10/19/2016   . RLL pneumonia 11/05/2012  . Sleep apnea    "can't tolerate mask" (05/17/2017)  . Tachy-brady syndrome ([place])    a. 06/27/2015 s/p SJM DC PPM (ser # @ [id number]).   Past Surgical History:  Procedure Laterality Date  . ATRIAL FIBRILLATION ABLATION N/A 05/17/2017   Procedure: ATRIAL FIBRILLATION ABLATION;  Surgeon: Constance Haw, MD;  Location: [facility];  Service: Cardiovascular;  Laterality: N/A;  . BACK SURGERY    . CARDIAC CATHETERIZATION  ~ 2000  . CLOSED REDUCTION HAND FRACTURE Right 1984  . COLONOSCOPY  06/27/2015   per Dr. Henrene Pastor, internal hemorrhoids and diverticulae, repeat 10 yrs  . CYSTOSCOPY WITH RETROGRADE PYELOGRAM, URETEROSCOPY AND STENT PLACEMENT Bilateral 02/14/2018   Procedure: CYSTOSCOPY WITH RETROGRADE PYELOGRAM, URETEROSCOPY AND STENT PLACEMENT;  Surgeon: Alexis Frock, MD;  Location: [facility];  Service: Urology;  Laterality: Bilateral;  . EP IMPLANTABLE DEVICE N/A 06/27/2015    Procedure: Pacemaker Implant;  Surgeon: Evans Lance, MD;  Location: [facility];  Service: Cardiovascular;  Laterality: N/A;  . EYE SURGERY  08/03/2019   Cataract  . EYE SURGERY  08/17/2019   Cataact  . FRACTURE SURGERY     right hand,finger  . HEMORRHOID BANDING    . HOLMIUM LASER APPLICATION Bilateral 32/95/1884   Procedure: HOLMIUM LASER APPLICATION;  Surgeon: Alexis Frock, MD;  Location: [facility];  Service: Urology;  Laterality: Bilateral;  . INSERT / REPLACE / REMOVE PACEMAKER    . LACERATION REPAIR Right 1984   "hand"  . POSTERIOR LUMBAR FUSION  1998   2 lumbar discs, Dr. Ellene Route; "ray cages"  . TOTAL HIP ARTHROPLASTY Left 09/01/2019   Procedure: LEFT TOTAL HIP ARTHROPLASTY ANTERIOR APPROACH;  Surgeon: Melrose Nakayama, MD;  Location: [facility];  Service: Orthopedics;  Laterality: Left;     Current Outpatient Medications  Medication Sig Dispense Refill  . acetaminophen (TYLENOL) 500 MG tablet Take 1,000 mg by mouth every 6 (six) hours as needed for moderate pain.     [name] diltiazem (CARDIZEM CD) 180 MG 24 hr capsule Take 1 capsule (180 mg total) by mouth daily. 90 capsule 2  . diltiazem (CARDIZEM) 30 MG tablet Cardizem 30mg  -- take 1 tablet every 4 hours AS NEEDED for heart rate >100 as long as blood pressure >100. 90 tablet 1  . furosemide (LASIX) 40 MG tablet Take 1 tablet (40 mg total) by mouth daily. 30 tablet 3  . gabapentin (NEURONTIN) 600 MG tablet Take 1 tablet (600 mg total) by mouth 3 (three) times daily. 270 tablet 1  . hydrochlorothiazide (HYDRODIURIL) 25 MG tablet TAKE 1 TABLET(25 MG) BY MOUTH DAILY 90 tablet 3  . HYDROcodone-acetaminophen (NORCO/VICODIN) 5-325 MG tablet Take 1-2 tablets by mouth every 6 (six) hours as needed for moderate pain or severe pain (post op pain). 40 tablet 0  . levothyroxine (SYNTHROID) 50 MCG tablet TAKE 1 TABLET BY MOUTH EVERY DAY 90 tablet 0  . losartan (COZAAR) 50 MG tablet TAKE 1 TABLET(50 MG) BY MOUTH DAILY 30 tablet 9  . potassium  chloride (KLOR-CON) 10 MEQ tablet TAKE 1 TABLET(10 MEQ) BY MOUTH DAILY 30 tablet 9  . predniSONE (DELTASONE) 10 MG tablet Take 1 tablet (10 mg total) by mouth 2 (two) times daily with a meal. 60 tablet 2  . simvastatin (ZOCOR) 20 MG tablet TAKE 1 TABLET BY MOUTH EVERY DAY 90 tablet 2  . tamsulosin (FLOMAX) 0.4 MG CAPS capsule TAKE ONE CAPSULE BY MOUTH EVERY DAY 90 capsule 3  . tiZANidine (ZANAFLEX) 4 MG tablet Take 1 tablet (4 mg total) by mouth every 6 (six) hours as needed for muscle spasms. 40 tablet 1  . traMADol (ULTRAM) 50 MG tablet Take 2 tablets (100 mg total) by mouth every 6 (six) hours as needed for moderate pain. 60 tablet 0  . XARELTO 20 MG TABS tablet TAKE 1 TABLET  BY MOUTH DAILY WITH SUPPER 90 tablet 2   No current facility-administered medications for this visit.    Allergies:   Patient has no known allergies.   Social History:  The patient  reports that he quit smoking about 24 years ago. His smoking use included cigarettes. He has a 56.00 pack-year smoking history. He has never used smokeless tobacco. He reports previous alcohol use. He reports that he does not use drugs.   Family History:  The patient's family history includes Cancer in his paternal aunt; Dementia in his mother; Diabetes in his paternal grandmother; Stroke in his father and paternal aunt.   ROS:  Please see the history of present illness.   Otherwise, review of systems is positive for none.   All other systems are reviewed and negative.   PHYSICAL EXAM: VS:  BP 112/64   Pulse 63   Ht 5\' 10"  (1.778 m)   Wt 252 lb (114.3 kg)   SpO2 95%   BMI 36.16 kg/m  , BMI Body mass index is 36.16 kg/m. GEN: Well nourished, well developed, in no acute distress  HEENT: normal  Neck: no JVD, carotid bruits, or masses Cardiac: RRR; no murmurs, rubs, or gallops,no edema  Respiratory:  clear to auscultation bilaterally, normal work of breathing GI: soft, nontender, nondistended, + BS MS: no deformity or atrophy    Skin: warm and dry, device site well healed Neuro:  Strength and sensation are intact Psych: euthymic mood, full affect  EKG:  EKG is not ordered today. Personal review of the ekg ordered 08/26/19 shows sinus rhythm  Personal review of the device interrogation today. Results in [facility]: 07/31/2019: NT-Pro BNP 80 08/21/2019: BUN 17; Creatinine, Ser 1.32; Hemoglobin 14.1; Platelets 260; Potassium 3.9; Sodium 143    Lipid Panel     Component Value Date/Time   CHOL 165 05/06/2019 1519   TRIG 144.0 05/06/2019 1519   TRIG 203 (HH) 12/10/2005 0839   HDL 52.40 05/06/2019 1519   CHOLHDL 3 05/06/2019 1519   VLDL 28.8 05/06/2019 1519   LDLCALC 84 05/06/2019 1519   LDLDIRECT 104.9 06/09/2009 0919     Wt Readings from Last 3 Encounters:  01/18/20 252 lb (114.3 kg)  01/06/20 253 lb (114.8 kg)  11/13/19 257 lb 9.6 oz (116.8 kg)      Other studies Reviewed: Additional studies/ records that were reviewed today include: TTE 11/10/15 Review of the above records today demonstrates:  - Left ventricle: The cavity size was normal. Wall thickness was   increased in a pattern of mild LVH. The estimated ejection   fraction was 55%. Wall motion was normal; there were no regional   wall motion abnormalities. Doppler parameters are consistent with   abnormal left ventricular relaxation (grade 1 diastolic   dysfunction). - Aortic valve: There was no stenosis. - Mitral valve: There was trivial regurgitation. - Left atrium: The atrium was mildly dilated. - Right ventricle: The cavity size was normal. Pacer wire or   catheter noted in right ventricle. Systolic function was normal. - Pulmonary arteries: No complete TR doppler jet so unable to   estimate PA systolic pressure. - Inferior vena cava: The vessel was normal in size. The   respirophasic diameter changes were in the normal range (= 50%),   consistent with normal central venous pressure. - Pericardium, extracardiac: A trivial  pericardial effusion was   identified posterior to the heart.  Myoview 06/04/14 Low risk stress nuclear study with a small sized,  mild intensity reversible defect concering for mild apical-inferior/inferoseptal ischeima.  Clinical Correlation is recommended.  ASSESSMENT AND PLAN:  1.  Persistent atrial fibrillation: Status post ablation 04/27/2017.  Currently on Xarelto and diltiazem with a CHA2DS2-VASc of 2.  He has had no further episodes of atrial fibrillation with a less than 1% burden on his device.  No changes.  2.  Hypertension: Currently well controlled  3.  Sinus node dysfunction: Status post Saint Jude dual-chamber pacemaker.  Device functioning appropriately.  No changes at this time.    4.  Lower extremity edema: Has had no further episodes  Current medicines are reviewed at length with the patient today.   The patient does not have concerns regarding his medicines.  The following changes were made today: None  Labs/ tests ordered today include:  Orders Placed This Encounter  Procedures  . CUP PACEART INCLINIC DEVICE CHECK     Disposition:   FU with Elon Eoff 12 months  Signed, Briellah Baik [name], MD  01/18/2020 4:09 PM     [address] [place] [address] [phone] (office) [phone]

## 2020-02-08 ENCOUNTER — Other Ambulatory Visit: Payer: Self-pay | Admitting: Family Medicine

## 2020-02-19 ENCOUNTER — Other Ambulatory Visit: Payer: Self-pay | Admitting: Family Medicine

## 2020-03-02 ENCOUNTER — Other Ambulatory Visit: Payer: Self-pay

## 2020-03-02 ENCOUNTER — Ambulatory Visit (INDEPENDENT_AMBULATORY_CARE_PROVIDER_SITE_OTHER): Payer: Medicare HMO

## 2020-03-02 DIAGNOSIS — Z0000 Encounter for general adult medical examination without abnormal findings: Secondary | ICD-10-CM

## 2020-03-02 NOTE — Patient Instructions (Signed)
Erik [name] , Thank you for taking time to come for your Medicare Wellness Visit. I appreciate your ongoing commitment to your health goals. Please review the following plan we discussed and let me know if I can assist you in the future.   Screening recommendations/referrals: Colonoscopy: Done 11/17/14 Recommended yearly ophthalmology/optometry visit for glaucoma screening and checkup Recommended yearly dental visit for hygiene and checkup  Vaccinations: Influenza vaccine: Done 11/23/19 Pneumococcal vaccine: Up to date Tdap vaccine: Due and discussed Shingles vaccine: 1st dose    Covid-19: Completed   Advanced directives: Please bring a copy of your health care power of attorney and living will to the office at your convenience.  Conditions/risks identified: Lose 30lbs   Next appointment: Follow up in one year for your annual wellness visit.   Preventive Care 65 Years and Older, Male Preventive care refers to lifestyle choices and visits with your health care provider that can promote health and wellness. What does preventive care include?  A yearly physical exam. This is also called an annual well check.  Dental exams once or twice a year.  Routine eye exams. Ask your health care provider how often you should have your eyes checked.  Personal lifestyle choices, including:  Daily care of your teeth and gums.  Regular physical activity.  Eating a healthy diet.  Avoiding tobacco and drug use.  Limiting alcohol use.  Practicing safe sex.  Taking low doses of aspirin every day.  Taking vitamin and mineral supplements as recommended by your health care provider. What happens during an annual well check? The services and screenings done by your health care provider during your annual well check will depend on your age, overall health, lifestyle risk factors, and family history of disease. Counseling  Your health care provider may ask you questions about your:  Alcohol  use.  Tobacco use.  Drug use.  Emotional well-being.  Home and relationship well-being.  Sexual activity.  Eating habits.  History of falls.  Memory and ability to understand (cognition).  Work and work Statistician. Screening  You may have the following tests or measurements:  Height, weight, and BMI.  Blood pressure.  Lipid and cholesterol levels. These may be checked every 5 years, or more frequently if you are over 83 years old.  Skin check.  Lung cancer screening. You may have this screening every year starting at age 46 if you have a 30-pack-year history of smoking and currently smoke or have quit within the past 15 years.  Fecal occult blood test (FOBT) of the stool. You may have this test every year starting at age 37.  Flexible sigmoidoscopy or colonoscopy. You may have a sigmoidoscopy every 5 years or a colonoscopy every 10 years starting at age 83.  Prostate cancer screening. Recommendations will vary depending on your family history and other risks.  Hepatitis C blood test.  Hepatitis B blood test.  Sexually transmitted disease (STD) testing.  Diabetes screening. This is done by checking your blood sugar (glucose) after you have not eaten for a while (fasting). You may have this done every 1-3 years.  Abdominal aortic aneurysm (AAA) screening. You may need this if you are a current or former smoker.  Osteoporosis. You may be screened starting at age 70 if you are at high risk. Talk with your health care provider about your test results, treatment options, and if necessary, the need for more tests. Vaccines  Your health care provider may recommend certain vaccines, such as:  Influenza  vaccine. This is recommended every year.  Tetanus, diphtheria, and acellular pertussis (Tdap, Td) vaccine. You may need a Td booster every 10 years.  Zoster vaccine. You may need this after age 64.  Pneumococcal 13-valent conjugate (PCV13) vaccine. One dose is  recommended after age 80.  Pneumococcal polysaccharide (PPSV23) vaccine. One dose is recommended after age 63. Talk to your health care provider about which screenings and vaccines you need and how often you need them. This information is not intended to replace advice given to you by your health care provider. Make sure you discuss any questions you have with your health care provider. Document Released: 03/11/2015 Document Revised: 11/02/2015 Document Reviewed: 12/14/2014 Elsevier Interactive Patient Education  2017 [place] Prevention in the Home Falls can cause injuries. They can happen to people of all ages. There are many things you can do to make your home safe and to help prevent falls. What can I do on the outside of my home?  Regularly fix the edges of walkways and driveways and fix any cracks.  Remove anything that might make you trip as you walk through a door, such as a raised step or threshold.  Trim any bushes or trees on the path to your home.  Use bright outdoor lighting.  Clear any walking paths of anything that might make someone trip, such as rocks or tools.  Regularly check to see if handrails are loose or broken. Make sure that both sides of any steps have handrails.  Any raised decks and porches should have guardrails on the edges.  Have any leaves, snow, or ice cleared regularly.  Use sand or salt on walking paths during winter.  Clean up any spills in your garage right away. This includes oil or grease spills. What can I do in the bathroom?  Use night lights.  Install grab bars by the toilet and in the tub and shower. Do not use towel bars as grab bars.  Use non-skid mats or decals in the tub or shower.  If you need to sit down in the shower, use a plastic, non-slip stool.  Keep the floor dry. Clean up any water that spills on the floor as soon as it happens.  Remove soap buildup in the tub or shower regularly.  Attach bath mats  securely with double-sided non-slip rug tape.  Do not have throw rugs and other things on the floor that can make you trip. What can I do in the bedroom?  Use night lights.  Make sure that you have a light by your bed that is easy to reach.  Do not use any sheets or blankets that are too big for your bed. They should not hang down onto the floor.  Have a firm chair that has side arms. You can use this for support while you get dressed.  Do not have throw rugs and other things on the floor that can make you trip. What can I do in the kitchen?  Clean up any spills right away.  Avoid walking on wet floors.  Keep items that you use a lot in easy-to-reach places.  If you need to reach something above you, use a strong step stool that has a grab bar.  Keep electrical cords out of the way.  Do not use floor polish or wax that makes floors slippery. If you must use wax, use non-skid floor wax.  Do not have throw rugs and other things on the floor that can  make you trip. What can I do with my stairs?  Do not leave any items on the stairs.  Make sure that there are handrails on both sides of the stairs and use them. Fix handrails that are broken or loose. Make sure that handrails are as long as the stairways.  Check any carpeting to make sure that it is firmly attached to the stairs. Fix any carpet that is loose or worn.  Avoid having throw rugs at the top or bottom of the stairs. If you do have throw rugs, attach them to the floor with carpet tape.  Make sure that you have a light switch at the top of the stairs and the bottom of the stairs. If you do not have them, ask someone to add them for you. What else can I do to help prevent falls?  Wear shoes that:  Do not have high heels.  Have rubber bottoms.  Are comfortable and fit you well.  Are closed at the toe. Do not wear sandals.  If you use a stepladder:  Make sure that it is fully opened. Do not climb a closed  stepladder.  Make sure that both sides of the stepladder are locked into place.  Ask someone to hold it for you, if possible.  Clearly mark and make sure that you can see:  Any grab bars or handrails.  First and last steps.  Where the edge of each step is.  Use tools that help you move around (mobility aids) if they are needed. These include:  Canes.  Walkers.  Scooters.  Crutches.  Turn on the lights when you go into a dark area. Replace any light bulbs as soon as they burn out.  Set up your furniture so you have a clear path. Avoid moving your furniture around.  If any of your floors are uneven, fix them.  If there are any pets around you, be aware of where they are.  Review your medicines with your doctor. Some medicines can make you feel dizzy. This can increase your chance of falling. Ask your doctor what other things that you can do to help prevent falls. This information is not intended to replace advice given to you by your health care provider. Make sure you discuss any questions you have with your health care provider. Document Released: 12/09/2008 Document Revised: 07/21/2015 Document Reviewed: 03/19/2014 Elsevier Interactive Patient Education  2017 [employer].

## 2020-03-02 NOTE — Progress Notes (Signed)
Virtual Visit via Telephone Note  I connected with  Erik [name] on 03/02/20 at  1:00 PM EST by telephone and verified that I am speaking with the correct person using two identifiers.  Medicare Annual Wellness visit completed telephonically due to Covid-19 pandemic.   Persons participating in this call: This Health Coach and this patient and wife Erik [name]   Location: Patient: Home Provider: Office   I discussed the limitations, risks, security and privacy concerns of performing an evaluation and management service by telephone and the availability of in person appointments. The patient expressed understanding and agreed to proceed.  Unable to perform video visit due to video visit attempted and failed and/or patient does not have video capability.   Some vital signs may be absent or patient reported.   Willette Brace, LPN    Subjective:   Erik [name] is a 75 y.o. male who presents for an Initial Medicare Annual Wellness Visit.  Review of Systems     Cardiac Risk Factors include: advanced age (>67men, >59 women);hypertension;dyslipidemia;male gender;obesity (BMI >30kg/m2)     Objective:    There were no vitals filed for this visit. There is no height or weight on file to calculate BMI.  Advanced Directives 03/02/2020 09/01/2019 09/01/2019 09/01/2019 08/20/2019 02/12/2018 01/24/2018  Does Patient Have a Medical Advance Directive? Yes Yes - Yes Yes Yes Yes  Type of Advance Directive [place];Living will [place];Living will [place];Living will [place];Living will [place];Living will [place];Living will [place];Living will  Does patient want to make changes to medical advance directive? - No - Patient declined - - - No - Patient declined -  Copy of [place] in Chart? No - copy requested - - No - copy requested - No -  copy requested -  Would patient like information on creating a medical advance directive? - - - - - - -    Current Medications (verified) Outpatient Encounter Medications as of 03/02/2020  Medication Sig  . acetaminophen (TYLENOL) 500 MG tablet Take 1,000 mg by mouth every 6 (six) hours as needed for moderate pain.   [name] diltiazem (CARDIZEM) 30 MG tablet Cardizem 30mg  -- take 1 tablet every 4 hours AS NEEDED for heart rate >100 as long as blood pressure >100.  . furosemide (LASIX) 40 MG tablet Take 1 tablet (40 mg total) by mouth daily.  [name] gabapentin (NEURONTIN) 600 MG tablet TAKE 1 TABLET(600 MG) BY MOUTH THREE TIMES DAILY  . hydrochlorothiazide (HYDRODIURIL) 25 MG tablet TAKE 1 TABLET(25 MG) BY MOUTH DAILY  . levothyroxine (SYNTHROID) 50 MCG tablet TAKE 1 TABLET BY MOUTH EVERY DAY  . losartan (COZAAR) 100 MG tablet TAKE 1 TABLET BY MOUTH DAILY  . potassium chloride (KLOR-CON) 10 MEQ tablet TAKE 1 TABLET(10 MEQ) BY MOUTH DAILY  . predniSONE (DELTASONE) 10 MG tablet Take 1 tablet (10 mg total) by mouth 2 (two) times daily with a meal.  . simvastatin (ZOCOR) 20 MG tablet TAKE 1 TABLET BY MOUTH EVERY DAY  . tamsulosin (FLOMAX) 0.4 MG CAPS capsule TAKE ONE CAPSULE BY MOUTH EVERY DAY  . tiZANidine (ZANAFLEX) 4 MG tablet Take 1 tablet (4 mg total) by mouth every 6 (six) hours as needed for muscle spasms.  . traMADol (ULTRAM) 50 MG tablet Take 2 tablets (100 mg total) by mouth every 6 (six) hours as needed for moderate pain.  [name] XARELTO 20  MG TABS tablet TAKE 1 TABLET BY MOUTH DAILY WITH SUPPER  . diltiazem (CARDIZEM CD) 180 MG 24 hr capsule Take 1 capsule (180 mg total) by mouth daily.  . [DISCONTINUED] HYDROcodone-acetaminophen (NORCO/VICODIN) 5-325 MG tablet Take 1-2 tablets by mouth every 6 (six) hours as needed for moderate pain or severe pain (post op pain). (Patient not taking: Reported on 03/02/2020)  . [DISCONTINUED] losartan (COZAAR) 50 MG tablet TAKE 1 TABLET(50 MG) BY MOUTH DAILY (Patient not  taking: Reported on 03/02/2020)   No facility-administered encounter medications on file as of 03/02/2020.    Allergies (verified) Patient has no known allergies.   History: Past Medical History:  Diagnosis Date  . Anxiety   . Arthritis   . Atypical chest pain 06/26/2015  . BPH with urinary obstruction   . Carotid disease, bilateral ([place])    a. 09/2014 Carotid U/S: 1-39% bilat ICA stenosis.  . Chronic lower back pain   . CKD (chronic kidney disease), stage III ([place])    pt. denies  . Diverticulitis 12/2016  . Diverticulosis   . Dyspnea    at rest and exertion  . Gout    "couple days/year" (05/17/2017)  . History of kidney stones   . Hyperlipidemia   . Hypertension   . Hypertensive heart disease   . Hypothyroidism   . Internal hemorrhoids   . Long QT interval 06/26/2015  . Nephrolithiasis   . Neuropathy 07/30/2017  . Non-obstructive CAD    a. 07/2002 Cath: LM 20, LAD 28m/d, LCX 50-48m, OM1 37m, RCA 40m, EF 60%; b. 05/2014 MV: low risk w/ small sized, mild intensity rev defect in apical/inferior/infsept area, nl EF->Med Rx.  . Panic attack   . Paroxysmal atrial fibrillation ([place])    a. Dx 04/2014; b. 05/2014 Echo: Ef 60-65%, no rwma, triv MR/TR, nl RV;  c. CHA2DS2VASc = 3-->was on eliquis, switched to xarelto 04/2015 2/2 cost; d. 05/2015 Tikosyn loaded w/ conversion to AF; e. 06/2015 QTc prolongation and bradycardia->tikosyn d/c'd, PPM placed, Amio started; f. 07/01/2015 In Aflutter @ clinic f/u.  [name] Paroxysmal atrial flutter ([place])    a. 06/2015 noted to be in rapid Aflutter in Afib clinic-->amio load continued.  . Persistent atrial fibrillation ([place]) 06/21/2015  . Presence of permanent cardiac pacemaker    St. Jude  . Prolapsed internal hemorrhoids, grade 2-3 10/19/2016   LL Gr 3 and RP/RA Gr 2 LL banded 10/19/2016   . RLL pneumonia 11/05/2012  . Sleep apnea    "can't tolerate mask" (05/17/2017)  . Tachy-brady syndrome ([place])    a. 06/27/2015 s/p SJM DC PPM (ser # @ [id number]).   Past Surgical  History:  Procedure Laterality Date  . ATRIAL FIBRILLATION ABLATION N/A 05/17/2017   Procedure: ATRIAL FIBRILLATION ABLATION;  Surgeon: Constance Haw, MD;  Location: [facility];  Service: Cardiovascular;  Laterality: N/A;  . BACK SURGERY    . CARDIAC CATHETERIZATION  ~ 2000  . CLOSED REDUCTION HAND FRACTURE Right 1984  . COLONOSCOPY  06/27/2015   per Dr. Henrene Pastor, internal hemorrhoids and diverticulae, repeat 10 yrs  . CYSTOSCOPY WITH RETROGRADE PYELOGRAM, URETEROSCOPY AND STENT PLACEMENT Bilateral 02/14/2018   Procedure: CYSTOSCOPY WITH RETROGRADE PYELOGRAM, URETEROSCOPY AND STENT PLACEMENT;  Surgeon: Alexis Frock, MD;  Location: [facility];  Service: Urology;  Laterality: Bilateral;  . EP IMPLANTABLE DEVICE N/A 06/27/2015   Procedure: Pacemaker Implant;  Surgeon: Evans Lance, MD;  Location: [facility];  Service: Cardiovascular;  Laterality: N/A;  . EYE SURGERY  08/03/2019   Cataract  . EYE SURGERY  08/17/2019   Cataact  . FRACTURE SURGERY     right hand,finger  . HEMORRHOID BANDING    . HOLMIUM LASER APPLICATION Bilateral 25/85/2778   Procedure: HOLMIUM LASER APPLICATION;  Surgeon: Alexis Frock, MD;  Location: [facility];  Service: Urology;  Laterality: Bilateral;  . INSERT / REPLACE / REMOVE PACEMAKER    . LACERATION REPAIR Right 1984   "hand"  . POSTERIOR LUMBAR FUSION  1998   2 lumbar discs, Dr. Ellene Route; "ray cages"  . TOTAL HIP ARTHROPLASTY Left 09/01/2019   Procedure: LEFT TOTAL HIP ARTHROPLASTY ANTERIOR APPROACH;  Surgeon: Melrose Nakayama, MD;  Location: [facility];  Service: Orthopedics;  Laterality: Left;   Family History  Problem Relation Age of Onset  . Dementia Mother   . Stroke Father   . Diabetes Paternal Grandmother   . Stroke Paternal Aunt        x 2  . Cancer Paternal Aunt        type unknown  . Colon cancer Neg Hx   . Stomach cancer Neg Hx    Social History   Socioeconomic History  . Marital status: Married    Spouse name: Not on file  .  Number of children: 1  . Years of education: Not on file  . Highest education level: Not on file  Occupational History  . Occupation: retired  Tobacco Use  . Smoking status: Former Smoker    Packs/day: 2.00    Years: 28.00    Pack years: 56.00    Types: Cigarettes    Quit date: 02/27/1995    Years since quitting: 25.0  . Smokeless tobacco: Never Used  Vaping Use  . Vaping Use: Never used  Substance and Sexual Activity  . Alcohol use: Yes    Alcohol/week: 1.0 standard drink    Types: 1 Cans of beer per week    Comment: couple times a month  . Drug use: No  . Sexual activity: Not Currently    Partners: Female  Other Topics Concern  . Not on file  Social History Narrative   He is married and retired he has 1 child   Social Determinants of Radio broadcast assistant Strain: Low Risk   . Difficulty of Paying Living Expenses: Not hard at all  Food Insecurity: No Food Insecurity  . Worried About Charity fundraiser in the Last Year: Never true  . Ran Out of Food in the Last Year: Never true  Transportation Needs: No Transportation Needs  . Lack of Transportation (Medical): No  . Lack of Transportation (Non-Medical): No  Physical Activity: Insufficiently Active  . Days of Exercise per Week: 4 days  . Minutes of Exercise per Session: 30 min  Stress: No Stress Concern Present  . Feeling of Stress : Not at all  Social Connections: Moderately Isolated  . Frequency of Communication with Friends and Family: More than three times a week  . Frequency of Social Gatherings with Friends and Family: Never  . Attends Religious Services: Never  . Active Member of Clubs or Organizations: No  . Attends Archivist Meetings: Never  . Marital Status: Married    Tobacco Counseling Counseling given: Not Answered   Clinical Intake:  Pre-visit preparation completed: Yes  Pain : No/denies pain     BMI - recorded: 36.16 Nutritional Status: BMI > 30  Obese Nutritional Risks:  None Diabetes: No  How often do you need to  have someone help you when you read instructions, pamphlets, or other written materials from your doctor or pharmacy?: 1 - Never  Diabetic?no  Interpreter Needed?: No  Information entered by :: Charlott Rakes, LPN   Activities of Daily Living In your present state of health, do you have any difficulty performing the following activities: 03/02/2020 09/01/2019  Hearing? Y N  Comment mild loss -  Vision? N N  Difficulty concentrating or making decisions? N N  Walking or climbing stairs? - Y  Dressing or bathing? N N  Doing errands, shopping? N N  Preparing Food and eating ? N -  Using the Toilet? N -  Managing your Medications? N -  Managing your Finances? N -  Housekeeping or managing your Housekeeping? N -  Some recent data might be hidden    Patient Care Team: [name], MD as PCP - General (Family Medicine) Constance Haw, MD as PCP - Electrophysiology (Cardiology) Lorretta Harp, MD as PCP - Cardiology (Cardiology)  Indicate any recent Medical Services you may have received from other than [facility] providers in the past year (date may be approximate).     Assessment:   This is a routine wellness examination for [facility].  Hearing/Vision screen  Hearing Screening   125Hz  250Hz  500Hz  1000Hz  2000Hz  3000Hz  4000Hz  6000Hz  8000Hz   Right ear:           Left ear:           Comments: Pt states mild hearing loss  Vision Screening Comments: Pt follows up annually with [facility] Dr Arturo Morton   Dietary issues and exercise activities discussed: Current Exercise Habits: The patient does not participate in regular exercise at present  Goals    . Patient Stated     Lose 30lbs this year      Depression Screen PHQ 2/9 Scores 03/02/2020 02/15/2017 08/25/2014  PHQ - 2 Score 0 0 0    Fall Risk Fall Risk  03/02/2020 01/21/2019 01/13/2018 02/15/2017 09/21/2016  Falls in the past year? 0 0 0 No No  Comment - [facility] Telephone Survey:  data to providers prior to load [employer] Telephone Survey: data to providers prior to load - [facility] Telephone Survey: data to providers prior to load  Number falls in past yr: 0 - - - -  Injury with Fall? 0 - - - -  Risk for fall due to : Impaired vision;Impaired balance/gait - - - -  Follow up Falls prevention discussed - - - -    FALL RISK PREVENTION PERTAINING TO THE HOME:  Any stairs in or around the home? Yes  If so, are there any without handrails? No  Home free of loose throw rugs in walkways, pet beds, electrical cords, etc? Yes  Adequate lighting in your home to reduce risk of falls? Yes   ASSISTIVE DEVICES UTILIZED TO PREVENT FALLS:  Life alert? No  Use of a cane, walker or w/c? No  Grab bars in the bathroom? No  Shower chair or bench in shower? Yes  Elevated toilet seat or a handicapped toilet? Yes   TIMED UP AND GO:  Was the test performed? No .     Cognitive Function:     6CIT Screen 03/02/2020  What Year? 0 points  What month? 0 points  Count back from 20 0 points  Months in reverse 0 points  Repeat phrase 0 points    Immunizations Immunization History  Administered Date(s) Administered  . Influenza, High Dose  Seasonal PF 02/15/2017, 12/10/2017, 12/07/2018  . Influenza,inj,Quad PF,6+ Mos 11/16/2015  . Influenza-Unspecified 11/23/2019  . PFIZER SARS-COV-2 Vaccination 03/23/2019, 04/13/2019, 11/26/2019  . Pneumococcal Conjugate-13 03/03/2018  . Pneumococcal Polysaccharide-23 05/06/2019  . Td 06/14/2008  . Zoster 11/19/2011  . Zoster Recombinat (Shingrix) 07/11/2017    TDAP status: Due, Education has been provided regarding the importance of this vaccine. Advised may receive this vaccine at local pharmacy or Health Dept. Aware to provide a copy of the vaccination record if obtained from local pharmacy or Health Dept. Verbalized acceptance and understanding.  Flu Vaccine: Done 11/23/19  Pneumococcal vaccine status: Up to date  Covid-19 vaccine status:  Completed vaccines  Qualifies for Shingles Vaccine? Yes   Zostavax completed Yes   Shingrix Completed?: No.    Education has been provided regarding the importance of this vaccine. Patient has been advised to call insurance company to determine out of pocket expense if they have not yet received this vaccine. Advised may also receive vaccine at local pharmacy or Health Dept. Verbalized acceptance and understanding.  Screening Tests Health Maintenance  Topic Date Due  . TETANUS/TDAP  06/15/2018  . COLONOSCOPY (Pts 45-66yrs Insurance coverage will need to be confirmed)  11/16/2024  . INFLUENZA VACCINE  Completed  . COVID-19 Vaccine  Completed  . Hepatitis C Screening  Completed  . PNA vac Low Risk Adult  Completed    Health Maintenance  Health Maintenance Due  Topic Date Due  . TETANUS/TDAP  06/15/2018    Colorectal cancer screening: Type of screening: Colonoscopy. Completed 11/17/14. Repeat every 10 years   Additional Screening:  Hepatitis C Screening: Completed 05/06/19  Vision Screening: Recommended annual ophthalmology exams for early detection of glaucoma and other disorders of the eye. Is the patient up to date with their annual eye exam?  Yes  Who is the provider or what is the name of the office in which the patient attends annual eye exams? [facility]   Dental Screening: Recommended annual dental exams for proper oral hygiene  Community Resource Referral / Chronic Care Management: CRR required this visit?  No   CCM required this visit?  No      Plan:     I have personally reviewed and noted the following in the patient's chart:   . Medical and social history . Use of alcohol, tobacco or illicit drugs  . Current medications and supplements . Functional ability and status . Nutritional status . Physical activity . Advanced directives . List of other physicians . Hospitalizations, surgeries, and ER visits in previous 12 months . Vitals . Screenings to  include cognitive, depression, and falls . Referrals and appointments  In addition, I have reviewed and discussed with patient certain preventive protocols, quality metrics, and best practice recommendations. A written personalized care plan for preventive services as well as general preventive health recommendations were provided to patient.     Willette Brace, LPN   8/0/3212   Nurse Notes: None

## 2020-03-22 ENCOUNTER — Ambulatory Visit (INDEPENDENT_AMBULATORY_CARE_PROVIDER_SITE_OTHER): Payer: Medicare HMO

## 2020-03-22 DIAGNOSIS — I495 Sick sinus syndrome: Secondary | ICD-10-CM

## 2020-03-23 ENCOUNTER — Other Ambulatory Visit: Payer: Self-pay | Admitting: Cardiology

## 2020-03-23 ENCOUNTER — Other Ambulatory Visit: Payer: Self-pay | Admitting: Family Medicine

## 2020-03-23 LAB — CUP PACEART REMOTE DEVICE CHECK
Battery Remaining Longevity: 113 mo
Battery Remaining Percentage: 95.5 %
Battery Voltage: 2.99 V
Brady Statistic AP VP Percent: 2 %
Brady Statistic AP VS Percent: 77 %
Brady Statistic AS VP Percent: 1 %
Brady Statistic AS VS Percent: 19 %
Brady Statistic RA Percent Paced: 75 %
Brady Statistic RV Percent Paced: 2 %
Date Time Interrogation Session: 20220126004750
Implantable Lead Implant Date: 20170501
Implantable Lead Implant Date: 20170501
Implantable Lead Location: 753859
Implantable Lead Location: 753860
Implantable Pulse Generator Implant Date: 20170501
Lead Channel Impedance Value: 410 Ohm
Lead Channel Impedance Value: 540 Ohm
Lead Channel Pacing Threshold Amplitude: 0.75 V
Lead Channel Pacing Threshold Amplitude: 0.875 V
Lead Channel Pacing Threshold Pulse Width: 0.5 ms
Lead Channel Pacing Threshold Pulse Width: 0.5 ms
Lead Channel Sensing Intrinsic Amplitude: 10 mV
Lead Channel Sensing Intrinsic Amplitude: 3.2 mV
Lead Channel Setting Pacing Amplitude: 1.875
Lead Channel Setting Pacing Amplitude: 2.5 V
Lead Channel Setting Pacing Pulse Width: 0.5 ms
Lead Channel Setting Sensing Sensitivity: 2 mV
Pulse Gen Model: 2272
Pulse Gen Serial Number: 7897836

## 2020-03-28 ENCOUNTER — Other Ambulatory Visit: Payer: Self-pay

## 2020-03-28 ENCOUNTER — Telehealth: Payer: Self-pay | Admitting: Family Medicine

## 2020-03-28 MED ORDER — FUROSEMIDE 40 MG PO TABS: ORAL_TABLET | ORAL | 3 refills | Status: DC

## 2020-03-28 NOTE — Telephone Encounter (Signed)
Medication has been submitted to pharmacy on 03/23/2020.   Resubmitted on 03/28/2020. Called and spoke with Hoyle Sauer to inform

## 2020-03-28 NOTE — Telephone Encounter (Signed)
Patient's wife states the pharmacy states they have not received a request to refill his Lasix.  Pharmacy: [facility] in [place]   Please advise.

## 2020-04-04 NOTE — Progress Notes (Signed)
Remote pacemaker transmission.   

## 2020-04-30 ENCOUNTER — Other Ambulatory Visit: Payer: Self-pay | Admitting: Family Medicine

## 2020-05-02 NOTE — Telephone Encounter (Signed)
Last refill- 11/13/2019--60 tabs with 2 refills Last office visit- 11/13/2019  No future appointment has been scheduled

## 2020-05-18 ENCOUNTER — Ambulatory Visit: Payer: Medicare HMO | Admitting: Physician Assistant

## 2020-06-15 ENCOUNTER — Ambulatory Visit: Payer: Medicare HMO | Admitting: Internal Medicine

## 2020-06-15 ENCOUNTER — Encounter: Payer: Self-pay | Admitting: Internal Medicine

## 2020-06-15 VITALS — BP 118/58 | HR 60 | Ht 70.0 in | Wt 256.2 lb

## 2020-06-15 DIAGNOSIS — K641 Second degree hemorrhoids: Secondary | ICD-10-CM | POA: Diagnosis not present

## 2020-06-15 NOTE — Patient Instructions (Signed)
HEMORRHOID BANDING PROCEDURE    FOLLOW-UP CARE   1. The procedure you have had should have been relatively painless since the banding of the area involved does not have nerve endings and there is no pain sensation.  The rubber band cuts off the blood supply to the hemorrhoid and the band may fall off as soon as 48 hours after the banding (the band may occasionally be seen in the toilet bowl following a bowel movement). You may notice a temporary feeling of fullness in the rectum which should respond adequately to plain Tylenol or Motrin.  2. Following the banding, avoid strenuous exercise that evening and resume full activity the next day.  A sitz bath (soaking in a warm tub) or bidet is soothing, and can be useful for cleansing the area after bowel movements.     3. To avoid constipation, take two tablespoons of natural wheat bran, natural oat bran, flax, Benefiber or any over the counter fiber supplement and increase your water intake to 7-8 glasses daily.    4. Unless you have been prescribed anorectal medication, do not put anything inside your rectum for two weeks: No suppositories, enemas, fingers, etc.  5. Occasionally, you may have more bleeding than usual after the banding procedure.  This is often from the untreated hemorrhoids rather than the treated one.  Don't be concerned if there is a tablespoon or so of blood.  If there is more blood than this, lie flat with your bottom higher than your head and apply an ice pack to the area. If the bleeding does not stop within a half an hour or if you feel faint, call our office at [phone] or go to the emergency room.  6. Problems are not common; however, if there is a substantial amount of bleeding, severe pain, chills, fever or difficulty passing urine (very rare) or other problems, you should call us at [phone] or report to the nearest emergency room.  7. Do not stay seated continuously for more than 2-3 hours for a day or two  after the procedure.  Tighten your buttock muscles 10-15 times every two hours and take 10-15 deep breaths every 1-2 hours.  Do not spend more than a few minutes on the toilet if you cannot empty your bowel; instead re-visit the toilet at a later time.    Use a tablespoon of Benefiber daily. Handout provided.   I appreciate the opportunity to care for you. Silvano Rusk, MD, [facility]

## 2020-06-15 NOTE — Progress Notes (Signed)
Erik [name] 75 y.o. 01-May-1945 [id number]  Assessment & Plan:   Prolapsed internal hemorrhoids, grade 2-3 LL banded Benefiber qd RTC 07/29/2020      Subjective:   Chief Complaint: bleeding hemorrhoids HPI Having chronic recurrent bleeding - has had LL banded 3 x already last in 05/2019 - did not f/u as recommended and somewhere along the way he started having BRB PR again. No pain. No sig straining. Still on eliquis. No Known Allergies Current Meds  Medication Sig  . acetaminophen (TYLENOL) 500 MG tablet Take 1,000 mg by mouth every 6 (six) hours as needed for moderate pain.   [name] diltiazem (CARDIZEM CD) 180 MG 24 hr capsule TAKE 1 CAPSULE(180 MG) BY MOUTH DAILY  . diltiazem (CARDIZEM) 30 MG tablet Cardizem 30mg  -- take 1 tablet every 4 hours AS NEEDED for heart rate >100 as long as blood pressure >100.  . furosemide (LASIX) 40 MG tablet TAKE 1 TABLET(40 MG) BY MOUTH DAILY  . gabapentin (NEURONTIN) 600 MG tablet TAKE 1 TABLET(600 MG) BY MOUTH THREE TIMES DAILY  . hydrochlorothiazide (HYDRODIURIL) 25 MG tablet TAKE 1 TABLET(25 MG) BY MOUTH DAILY  . levothyroxine (SYNTHROID) 50 MCG tablet TAKE 1 TABLET BY MOUTH EVERY DAY  . losartan (COZAAR) 100 MG tablet TAKE 1 TABLET BY MOUTH DAILY  . potassium chloride (KLOR-CON) 10 MEQ tablet TAKE 1 TABLET(10 MEQ) BY MOUTH DAILY  . predniSONE (DELTASONE) 10 MG tablet TAKE 1 TABLET(10 MG) BY MOUTH TWICE DAILY WITH A MEAL  . simvastatin (ZOCOR) 20 MG tablet TAKE 1 TABLET BY MOUTH EVERY DAY  . tamsulosin (FLOMAX) 0.4 MG CAPS capsule TAKE ONE CAPSULE BY MOUTH EVERY DAY  . tiZANidine (ZANAFLEX) 4 MG tablet Take 1 tablet (4 mg total) by mouth every 6 (six) hours as needed for muscle spasms.  . traMADol (ULTRAM) 50 MG tablet Take 2 tablets (100 mg total) by mouth every 6 (six) hours as needed for moderate pain.  [name] XARELTO 20 MG TABS tablet TAKE 1 TABLET BY MOUTH DAILY WITH SUPPER   Past Medical History:  Diagnosis Date  . Anxiety   . Arthritis    . Atypical chest pain 06/26/2015  . BPH with urinary obstruction   . Carotid disease, bilateral ([place])    a. 09/2014 Carotid U/S: 1-39% bilat ICA stenosis.  . Chronic lower back pain   . CKD (chronic kidney disease), stage III ([place])    pt. denies  . Diverticulitis 12/2016  . Diverticulosis   . Dyspnea    at rest and exertion  . Gout    "couple days/year" (05/17/2017)  . History of kidney stones   . Hyperlipidemia   . Hypertension   . Hypertensive heart disease   . Hypothyroidism   . Internal hemorrhoids   . Long QT interval 06/26/2015  . Nephrolithiasis   . Neuropathy 07/30/2017  . Non-obstructive CAD    a. 07/2002 Cath: LM 20, LAD 73m/d, LCX 50-32m, OM1 55m, RCA 78m, EF 60%; b. 05/2014 MV: low risk w/ small sized, mild intensity rev defect in apical/inferior/infsept area, nl EF->Med Rx.  . Panic attack   . Paroxysmal atrial fibrillation ([place])    a. Dx 04/2014; b. 05/2014 Echo: Ef 60-65%, no rwma, triv MR/TR, nl RV;  c. CHA2DS2VASc = 3-->was on eliquis, switched to xarelto 04/2015 2/2 cost; d. 05/2015 Tikosyn loaded w/ conversion to AF; e. 06/2015 QTc prolongation and bradycardia->tikosyn d/c'd, PPM placed, Amio started; f. 07/01/2015 In Aflutter @ clinic f/u.  [name] Paroxysmal atrial flutter (  [place])    a. 06/2015 noted to be in rapid Aflutter in Afib clinic-->amio load continued.  . Persistent atrial fibrillation ([place]) 06/21/2015  . Presence of permanent cardiac pacemaker    St. Jude  . Prolapsed internal hemorrhoids, grade 2-3 10/19/2016   LL Gr 3 and RP/RA Gr 2 LL banded 10/19/2016   . RLL pneumonia 11/05/2012  . Sleep apnea    "can't tolerate mask" (05/17/2017)  . Tachy-brady syndrome ([place])    a. 06/27/2015 s/p SJM DC PPM (ser # @ [id number]).   Past Surgical History:  Procedure Laterality Date  . ATRIAL FIBRILLATION ABLATION N/A 05/17/2017   Procedure: ATRIAL FIBRILLATION ABLATION;  Surgeon: Constance Haw, MD;  Location: [facility];  Service: Cardiovascular;  Laterality: N/A;  . BACK  SURGERY    . CARDIAC CATHETERIZATION  ~ 2000  . CLOSED REDUCTION HAND FRACTURE Right 1984  . COLONOSCOPY  06/27/2015   per Dr. Henrene Pastor, internal hemorrhoids and diverticulae, repeat 10 yrs  . CYSTOSCOPY WITH RETROGRADE PYELOGRAM, URETEROSCOPY AND STENT PLACEMENT Bilateral 02/14/2018   Procedure: CYSTOSCOPY WITH RETROGRADE PYELOGRAM, URETEROSCOPY AND STENT PLACEMENT;  Surgeon: Alexis Frock, MD;  Location: [facility];  Service: Urology;  Laterality: Bilateral;  . EP IMPLANTABLE DEVICE N/A 06/27/2015   Procedure: Pacemaker Implant;  Surgeon: Evans Lance, MD;  Location: [facility];  Service: Cardiovascular;  Laterality: N/A;  . EYE SURGERY  08/03/2019   Cataract  . EYE SURGERY  08/17/2019   Cataact  . FRACTURE SURGERY     right hand,finger  . HEMORRHOID BANDING    . HOLMIUM LASER APPLICATION Bilateral 70/35/0093   Procedure: HOLMIUM LASER APPLICATION;  Surgeon: Alexis Frock, MD;  Location: [facility];  Service: Urology;  Laterality: Bilateral;  . INSERT / REPLACE / REMOVE PACEMAKER    . LACERATION REPAIR Right 1984   "hand"  . POSTERIOR LUMBAR FUSION  1998   2 lumbar discs, Dr. Ellene Route; "ray cages"  . TOTAL HIP ARTHROPLASTY Left 09/01/2019   Procedure: LEFT TOTAL HIP ARTHROPLASTY ANTERIOR APPROACH;  Surgeon: Melrose Nakayama, MD;  Location: [facility];  Service: Orthopedics;  Laterality: Left;   Social History   Social History Narrative   He is married and retired he has 1 child   family history includes Cancer in his paternal aunt; Dementia in his mother; Diabetes in his paternal grandmother; Stroke in his father and paternal aunt.   Review of Systems   Objective:   Physical Exam BP (!) 118/58   Pulse 60   Ht 5\' 10"  (1.778 m)   Wt 256 lb 3.2 oz (116.2 kg)   BMI 36.76 kg/m    Rectal NL anoderm - no mass  Anoscopy  Gr 2 internal hemorrhoids w/ contact bleeding LL  PROCEDURE NOTE: The patient presents with symptomatic grade 2  hemorrhoids, requesting rubber band ligation  of his/her hemorrhoidal disease.  All risks, benefits and alternative forms of therapy were described and informed consent was obtained.   The anorectum was pre-medicated with 0.125% NTG and 5% lidocaine The decision was made to band the LL internal hemorrhoid, and the Chrisman was used to perform band ligation without complication.  Digital anorectal examination was then performed to assure proper positioning of the band, and to adjust the banded tissue as required.  The patient was discharged home without pain or other issues.  Dietary and behavioral recommendations were given and along with follow-up instructions.

## 2020-06-15 NOTE — Assessment & Plan Note (Signed)
LL banded Benefiber qd RTC 07/29/2020

## 2020-06-21 ENCOUNTER — Ambulatory Visit (INDEPENDENT_AMBULATORY_CARE_PROVIDER_SITE_OTHER): Payer: Medicare HMO

## 2020-06-21 DIAGNOSIS — I495 Sick sinus syndrome: Secondary | ICD-10-CM | POA: Diagnosis not present

## 2020-06-23 LAB — CUP PACEART REMOTE DEVICE CHECK
Battery Remaining Longevity: 113 mo
Battery Remaining Percentage: 95.5 %
Battery Voltage: 2.98 V
Brady Statistic AP VP Percent: 1.9 %
Brady Statistic AP VS Percent: 77 %
Brady Statistic AS VP Percent: 1 %
Brady Statistic AS VS Percent: 18 %
Brady Statistic RA Percent Paced: 74 %
Brady Statistic RV Percent Paced: 1.9 %
Date Time Interrogation Session: 20220427235923
Implantable Lead Implant Date: 20170501
Implantable Lead Implant Date: 20170501
Implantable Lead Location: 753859
Implantable Lead Location: 753860
Implantable Pulse Generator Implant Date: 20170501
Lead Channel Impedance Value: 400 Ohm
Lead Channel Impedance Value: 530 Ohm
Lead Channel Pacing Threshold Amplitude: 0.75 V
Lead Channel Pacing Threshold Amplitude: 0.875 V
Lead Channel Pacing Threshold Pulse Width: 0.5 ms
Lead Channel Pacing Threshold Pulse Width: 0.5 ms
Lead Channel Sensing Intrinsic Amplitude: 3.6 mV
Lead Channel Sensing Intrinsic Amplitude: 6.9 mV
Lead Channel Setting Pacing Amplitude: 1.875
Lead Channel Setting Pacing Amplitude: 2.5 V
Lead Channel Setting Pacing Pulse Width: 0.5 ms
Lead Channel Setting Sensing Sensitivity: 2 mV
Pulse Gen Model: 2272
Pulse Gen Serial Number: 7897836

## 2020-07-04 ENCOUNTER — Telehealth: Payer: Self-pay | Admitting: Internal Medicine

## 2020-07-04 NOTE — Telephone Encounter (Signed)
He was having bleeding before as well - could be that banding did not fix that yet  I would like to see him - could use an 1130 or a 350 PM slot any day possible this week  Keep the Jun appt on books  Thanks

## 2020-07-04 NOTE — Telephone Encounter (Signed)
BRBPR since banding on 06/15/20.  He has another banding appt  on 6/3.  He would like to know if he should do anything prior to that appt. Please advise

## 2020-07-04 NOTE — Telephone Encounter (Signed)
Patient wife called and stated her husband is having rectal bleeding seeking advise.

## 2020-07-05 NOTE — Telephone Encounter (Signed)
The pt has been scheduled for this Friday at 350 pm.  His wife was notified.

## 2020-07-05 NOTE — Telephone Encounter (Signed)
Barbera Setters is off today so please call the patient and set up thx

## 2020-07-08 ENCOUNTER — Encounter: Payer: Self-pay | Admitting: Internal Medicine

## 2020-07-08 ENCOUNTER — Telehealth: Payer: Self-pay

## 2020-07-08 ENCOUNTER — Ambulatory Visit: Payer: Medicare HMO | Admitting: Internal Medicine

## 2020-07-08 VITALS — BP 118/62 | HR 84 | Ht 69.0 in | Wt 258.0 lb

## 2020-07-08 DIAGNOSIS — K641 Second degree hemorrhoids: Secondary | ICD-10-CM | POA: Diagnosis not present

## 2020-07-08 DIAGNOSIS — K625 Hemorrhage of anus and rectum: Secondary | ICD-10-CM | POA: Diagnosis not present

## 2020-07-08 DIAGNOSIS — R6889 Other general symptoms and signs: Secondary | ICD-10-CM | POA: Diagnosis not present

## 2020-07-08 MED ORDER — SUPREP BOWEL PREP KIT 17.5-3.13-1.6 GM/177ML PO SOLN: 1.0000 | ORAL | 0 refills | Status: DC

## 2020-07-08 NOTE — Progress Notes (Signed)
 Erik [name] 75 y.o. 05/06/1945 [id number]  Assessment & Plan:   Encounter Diagnoses  Name Primary?  . Rectal bleeding Yes  . Abnormal digital rectal exam   . Prolapsed internal hemorrhoids, grade 2-3     I know he has hemorrhoids and I believe they have been bleeding but this persistence in this tenderness and pain with rectal exam and anoscopy warrant additional evaluation with colonoscopy.  That is scheduled for June 1 on my schedule.  We will hold Xarelto 2 days prior clarify with cardiology.    Dr. Perry his primary gastroenterologist he is gone on vacation over the next couple of weeks as well as the patient for 1 of these weeks in between also.  I think we need to get this procedure done so it was set up on my schedule.  He does not have rectal pain with defecation or otherwise so I am not sure why he was so tender on this exam.  This is not where a banding ulcer should be either as I had banded his left lateral hemorrhoid back in April.  I appreciate the opportunity to care for this patient. CC: Fry, Stephen A, MD  John Perry, MD   Subjective:   Chief Complaint: Rectal bleeding  HPI The patient is a 75-year-old white man and says he has had persistent rectal bleeding with defecation after hemorrhoidal banding on 06/15/2020.  Recall that I had banded hemorrhoids several times in the past from 20 18-20 21 and he had improvement in symptoms.  He is not having any rectal pain.  He is on Eliquis.  Last colonoscopy in 2016 negative.  No change in stool caliber or bowel habits. No Known Allergies Current Meds  Medication Sig  . acetaminophen (TYLENOL) 500 MG tablet Take 1,000 mg by mouth every 6 (six) hours as needed for moderate pain.   . diltiazem (CARDIZEM CD) 180 MG 24 hr capsule TAKE 1 CAPSULE(180 MG) BY MOUTH DAILY  . diltiazem (CARDIZEM) 30 MG tablet Cardizem 30mg -- take 1 tablet every 4 hours AS NEEDED for heart rate >100 as long as blood pressure >100.  .  furosemide (LASIX) 40 MG tablet TAKE 1 TABLET(40 MG) BY MOUTH DAILY  . gabapentin (NEURONTIN) 600 MG tablet TAKE 1 TABLET(600 MG) BY MOUTH THREE TIMES DAILY  . hydrochlorothiazide (HYDRODIURIL) 25 MG tablet TAKE 1 TABLET(25 MG) BY MOUTH DAILY  . levothyroxine (SYNTHROID) 50 MCG tablet TAKE 1 TABLET BY MOUTH EVERY DAY  . losartan (COZAAR) 100 MG tablet TAKE 1 TABLET BY MOUTH DAILY  . Na Sulfate-K Sulfate-Mg Sulf (SUPREP BOWEL PREP KIT) 17.5-3.13-1.6 GM/177ML SOLN Take 1 kit by mouth as directed.  . potassium chloride (KLOR-CON) 10 MEQ tablet TAKE 1 TABLET(10 MEQ) BY MOUTH DAILY  . predniSONE (DELTASONE) 10 MG tablet TAKE 1 TABLET(10 MG) BY MOUTH TWICE DAILY WITH A MEAL  . simvastatin (ZOCOR) 20 MG tablet TAKE 1 TABLET BY MOUTH EVERY DAY  . tamsulosin (FLOMAX) 0.4 MG CAPS capsule TAKE ONE CAPSULE BY MOUTH EVERY DAY  . XARELTO 20 MG TABS tablet TAKE 1 TABLET BY MOUTH DAILY WITH SUPPER   Past Medical History:  Diagnosis Date  . Anxiety   . Arthritis   . Atypical chest pain 06/26/2015  . BPH with urinary obstruction   . Carotid disease, bilateral ([facility])    a. 09/2014 Carotid U/S: 1-39% bilat ICA stenosis.  . Chronic lower back pain   . CKD (chronic kidney disease), stage III ([facility])      pt. denies  . Diverticulitis 12/2016  . Diverticulosis   . Dyspnea    at rest and exertion  . Gout    "couple days/year" (05/17/2017)  . History of kidney stones   . Hyperlipidemia   . Hypertension   . Hypertensive heart disease   . Hypothyroidism   . Internal hemorrhoids   . Long QT interval 06/26/2015  . Nephrolithiasis   . Neuropathy 07/30/2017  . Non-obstructive CAD    a. 07/2002 Cath: LM 20, LAD 59md, LCX 50-631mOM1 4061mCA 37m3m 60%; b. 05/2014 MV: low risk w/ small sized, mild intensity rev defect in apical/inferior/infsept area, nl EF->Med Rx.  . Panic attack   . Paroxysmal atrial fibrillation ([facility])[place] a. Dx 04/2014; b. 05/2014 Echo: Ef 60-65%, no rwma, triv MR/TR, nl RV;  c. CHA2DS2VASc = 3-->was  on eliquis, switched to xarelto 04/2015 2/2 cost; d. 05/2015 Tikosyn loaded w/ conversion to AF; e. 06/2015 QTc prolongation and bradycardia->tikosyn d/c'd, PPM placed, Amio started; f. 07/01/2015 In Aflutter @ clinic f/u.  . PaMarland Kitchenoxysmal atrial flutter ([facility])[place] a. 06/2015 noted to be in rapid Aflutter in Afib clinic-->amio load continued.  . Persistent atrial fibrillation ([facility])[place]25/2017  . Presence of permanent cardiac pacemaker    St. Jude  . Prolapsed internal hemorrhoids, grade 2-3 10/19/2016   LL Gr 3 and RP/RA Gr 2 LL banded 10/19/2016   . RLL pneumonia 11/05/2012  . Sleep apnea    "can't tolerate mask" (05/17/2017)  . Tachy-brady syndrome ([facility])[place] a. 06/27/2015 s/p SJM DC PPM (ser # @ [id number] Past Surgical History:  Procedure Laterality Date  . ATRIAL FIBRILLATION ABLATION N/A 05/17/2017   Procedure: ATRIAL FIBRILLATION ABLATION;  Surgeon: CamnConstance Haw;  [facility];  Service: Cardiovascular;  Laterality: N/A;  . BACK SURGERY    . CARDIAC CATHETERIZATION  ~ 2000  . CLOSED REDUCTION HAND FRACTURE Right 1984  . COLONOSCOPY  06/27/2015   per Dr. PerrHenrene Pastorternal hemorrhoids and diverticulae, repeat 10 yrs  . CYSTOSCOPY WITH RETROGRADE PYELOGRAM, URETEROSCOPY AND STENT PLACEMENT Bilateral 02/14/2018   Procedure: CYSTOSCOPY WITH RETROGRADE PYELOGRAM, URETEROSCOPY AND STENT PLACEMENT;  Surgeon: MannAlexis Frock;  [facility];  Service: Urology;  Laterality: Bilateral;  . EP IMPLANTABLE DEVICE N/A 06/27/2015   Procedure: Pacemaker Implant;  Surgeon: GregEvans Lance;  [facility];  Service: Cardiovascular;  Laterality: N/A;  . EYE SURGERY  08/03/2019   Cataract  . EYE SURGERY  08/17/2019   Cataact  . FRACTURE SURGERY     right hand,finger  . HEMORRHOID BANDING    . HOLMIUM LASER APPLICATION Bilateral 12/262/[id number]/4854rocedure: HOLMIUM LASER APPLICATION;  Surgeon: MannAlexis Frock;  [facility];  Service: Urology;  Laterality:  Bilateral;  . INSERT / REPLACE / REMOVE PACEMAKER    . LACERATION REPAIR Right 1984   "hand"  . POSTERIOR LUMBAR FUSION  1998   2 lumbar discs, Dr. ElsnEllene Routeay cages"  . TOTAL HIP ARTHROPLASTY Left 09/01/2019   Procedure: LEFT TOTAL HIP ARTHROPLASTY ANTERIOR APPROACH;  Surgeon: DallMelrose Nakayama;  [facility];  Service: Orthopedics;  Laterality: Left;   Social History   Social History Narrative   He is married and retired he has 1 child   family history includes Cancer in his paternal aunt; Dementia in his mother; Diabetes in his paternal grandmother; Stroke in his father and paternal aunt.  Review of Systems As above  Objective:   Physical Exam BP 118/62   Pulse 84   Ht 5' 9" (1.753 m)   Wt 258 lb (117 kg)   BMI 38.10 kg/m   Obese white male in no acute distress abdomen nontender Rectal exam tender anteriorly question enlarged prostate versus mucosal abnormality of the rectum.  There is no mass but there is a firm slightly raised ridge in this area.  Very tender there  Anoscopy is performed and shows grade 2 inflamed internal hemorrhoids and no ulcer or fissure..   

## 2020-07-08 NOTE — Telephone Encounter (Signed)
[facility] Pre-operative Risk Assessment     Request for surgical clearance:     Endoscopy Procedure  What type of surgery is being performed?     colonoscopy  When is this surgery scheduled?     07/28/2020  What type of clearance is required ?   Pharmacy  Are there any medications that need to be held prior to surgery and how long? Xarelto, 2 days  Practice name and name of physician performing surgery?      [facility]  What is your office phone and fax number?      Phone- [phone]  [phone]  Anesthesia type (None, local, MAC, general) ?       MAC

## 2020-07-08 NOTE — Patient Instructions (Signed)
You have been scheduled for a colonoscopy. Please follow written instructions given to you at your visit today.  ?Please pick up your prep supplies at the pharmacy within the next 1-3 days. ?If you use inhalers (even only as needed), please bring them with you on the day of your procedure. ? ?You will be contaced by our office prior to your procedure for directions on holding your Xarelto.  If you do not hear from our office 1 week prior to your scheduled procedure, please call [phone] to discuss. ? ?I appreciate the opportunity to care for you. ?Carl Gessner, MD, FACG ?

## 2020-07-10 ENCOUNTER — Other Ambulatory Visit: Payer: Self-pay | Admitting: Cardiology

## 2020-07-11 ENCOUNTER — Telehealth: Payer: Self-pay

## 2020-07-11 NOTE — Telephone Encounter (Signed)
Patient with diagnosis of A Fib on Xarelto for anticoagulation.    Procedure: colonoscopy Date of procedure: 07/28/20   CHA2DS2-VASc Score = 3  This indicates a 3.2% annual risk of stroke. The patient's score is based upon: CHF History: No HTN History: Yes Diabetes History: No Stroke History: No Vascular Disease History: Yes Age Score: 1 Gender Score: 0    CrCl 63 mL/min Platelet count 260K  Per office protocol, patient can hold Xarelto for 2 days prior to procedure.

## 2020-07-11 NOTE — Telephone Encounter (Signed)
Patients wife informed for Gershon Mussel to hold his xarelto 2 days prior to his colonoscopy . She verbalized understanding.

## 2020-07-11 NOTE — Telephone Encounter (Signed)
   Name: Erik [name]  DOB: Oct 19, 1945  MRN: [id number]   Primary Cardiologist: Quay Burow, MD  Chart reviewed as part of pre-operative protocol coverage. We have been asked for guidance to hold xarelto. Per our clinical pharmacist:  Patient with diagnosis of A Fib on Xarelto for anticoagulation.    Procedure: colonoscopy Date of procedure: 07/28/20   CHA2DS2-VASc Score = 3  This indicates a 3.2% annual risk of stroke. The patient's score is based upon: CHF History: No HTN History: Yes Diabetes History: No Stroke History: No Vascular Disease History: Yes Age Score: 1 Gender Score: 0    CrCl 63 mL/min Platelet count 260K  Per office protocol, patient can hold Xarelto for 2 days prior to procedure  I will route this recommendation to the requesting party via [facility] fax function and remove from pre-op pool. Please call with questions.  Tami Lin Mikela Senn, PA 07/11/2020, 1:47 PM

## 2020-07-12 ENCOUNTER — Telehealth: Payer: Self-pay | Admitting: Internal Medicine

## 2020-07-12 MED ORDER — SUPREP BOWEL PREP KIT 17.5-3.13-1.6 GM/177ML PO SOLN: 1.0000 | ORAL | 0 refills | Status: DC

## 2020-07-12 NOTE — Telephone Encounter (Signed)
[facility] at [facility]. Patient's wife informed.

## 2020-07-12 NOTE — Telephone Encounter (Signed)
Inbound call from patient. Requesting if could send the prep [facility] to [facility] [address] [place], [place].

## 2020-07-12 NOTE — Progress Notes (Signed)
Remote pacemaker transmission.   

## 2020-07-27 DIAGNOSIS — Z86010 Personal history of colonic polyps: Secondary | ICD-10-CM

## 2020-07-27 DIAGNOSIS — Z860101 Personal history of adenomatous and serrated colon polyps: Secondary | ICD-10-CM

## 2020-07-27 HISTORY — DX: Personal history of adenomatous and serrated colon polyps: Z86.0101

## 2020-07-27 HISTORY — DX: Personal history of colonic polyps: Z86.010

## 2020-07-28 ENCOUNTER — Encounter: Payer: Self-pay | Admitting: Internal Medicine

## 2020-07-28 ENCOUNTER — Other Ambulatory Visit: Payer: Self-pay

## 2020-07-28 ENCOUNTER — Ambulatory Visit (AMBULATORY_SURGERY_CENTER): Payer: Medicare HMO | Admitting: Internal Medicine

## 2020-07-28 VITALS — BP 112/55 | HR 60 | Temp 97.8°F | Resp 9 | Ht 69.0 in | Wt 258.0 lb

## 2020-07-28 DIAGNOSIS — K625 Hemorrhage of anus and rectum: Secondary | ICD-10-CM | POA: Diagnosis not present

## 2020-07-28 DIAGNOSIS — D123 Benign neoplasm of transverse colon: Secondary | ICD-10-CM

## 2020-07-28 DIAGNOSIS — K5730 Diverticulosis of large intestine without perforation or abscess without bleeding: Secondary | ICD-10-CM

## 2020-07-28 MED ORDER — HYDROCORTISONE ACETATE 25 MG RE SUPP: 25.0000 mg | Freq: Every day | RECTAL | 0 refills | Status: DC

## 2020-07-28 MED ORDER — SODIUM CHLORIDE 0.9 % IV SOLN: 500.0000 mL | INTRAVENOUS | Status: DC

## 2020-07-28 NOTE — Patient Instructions (Addendum)
HANDOUTS PROVIDED ON: POLYPS, DIVERTICULOSIS, & HEMORRHOIDS  You may resume your previous diet and medication schedule EXCEPT Xarelto.  The Xarelto may be resumed tomorrow 07/29/2020.  The pain and bleeding is from hemorrhoids.  External ones, they cannot be banded.  Warm soaks in a tub i.e. sitz bath's will help.  I am prescribing hydrocortisone suppositories use those nightly for at least a week up to 2 weeks to try to calm things down.  I know the colonoscopy prep exacerbated things.  You can also purchase over-the-counter 5% lidocaine cream and use that for pain relief if you need it.  This should get better with time but if it does not please let me know.  I did remove 2 tiny polyps and saw a little bit of diverticulosis.  These are not problems.  I appreciate the opportunity to care for you. Gatha Mayer, MD, FACG  YOU HAD AN ENDOSCOPIC PROCEDURE TODAY AT [facility]:   Refer to the procedure report that was given to you for any specific questions about what was found during the examination.  If the procedure report does not answer your questions, please call your gastroenterologist to clarify.  If you requested that your care partner not be given the details of your procedure findings, then the procedure report has been included in a sealed envelope for you to review at your convenience later.  YOU SHOULD EXPECT: Some feelings of bloating in the abdomen. Passage of more gas than usual.  Walking can help get rid of the air that was put into your GI tract during the procedure and reduce the bloating. If you had a lower endoscopy (such as a colonoscopy or flexible sigmoidoscopy) you may notice spotting of blood in your stool or on the toilet paper. If you underwent a bowel prep for your procedure, you may not have a normal bowel movement for a few days.  Please Note:  You might notice some irritation and congestion in your nose or some drainage.  This is from the oxygen  used during your procedure.  There is no need for concern and it should clear up in a day or so.  SYMPTOMS TO REPORT IMMEDIATELY:   Following lower endoscopy (colonoscopy or flexible sigmoidoscopy):  Excessive amounts of blood in the stool  Significant tenderness or worsening of abdominal pains  Swelling of the abdomen that is new, acute  Fever of 100F or higher  For urgent or emergent issues, a gastroenterologist can be reached at any hour by calling [phone]. Do not use MyChart messaging for urgent concerns.    DIET:  We do recommend a small meal at first, but then you may proceed to your regular diet.  Drink plenty of fluids but you should avoid alcoholic beverages for 24 hours.  ACTIVITY:  You should plan to take it easy for the rest of today and you should NOT DRIVE or use heavy machinery until tomorrow (because of the sedation medicines used during the test).    FOLLOW UP: Our staff will call the number listed on your records on Monday morning between 7:15 am and 8:15 am following your procedure to check on you and address any questions or concerns that you may have regarding the information given to you following your procedure. If we do not reach you, we will leave a message.  We will attempt to reach you two times.  During this call, we will ask if you have developed any symptoms of COVID  19. If you develop any symptoms (ie: fever, flu-like symptoms, shortness of breath, cough etc.) before then, please call [phone].  If you test positive for Covid 19 in the 2 weeks post procedure, please call and report this information to [place].    If any biopsies were taken you will be contacted by phone or by letter within the next 1-3 weeks.  Please call us at [phone] if you have not heard about the biopsies in 3 weeks.    SIGNATURES/CONFIDENTIALITY: You and/or your care partner have signed paperwork which will be entered into your electronic medical record.  These signatures  attest to the fact that that the information above on your After Visit Summary has been reviewed and is understood.  Full responsibility of the confidentiality of this discharge information lies with you and/or your care-partner.

## 2020-07-28 NOTE — Op Note (Signed)
[facility] Patient Name: Erik [name] Procedure Date: 07/28/2020 7:14 AM MRN: [id number] Endoscopist: Gatha Mayer , MD Age: 75 Referring MD:  Date of Birth: May 03, 1945 Gender: Male Account #: [id number] Procedure:                Colonoscopy Indications:              Rectal bleeding Medicines:                Propofol per Anesthesia, Monitored Anesthesia Care Procedure:                Pre-Anesthesia Assessment:                           - Prior to the procedure, a History and Physical                            was performed, and patient medications and                            allergies were reviewed. The patient's tolerance of                            previous anesthesia was also reviewed. The risks                            and benefits of the procedure and the sedation                            options and risks were discussed with the patient.                            All questions were answered, and informed consent                            was obtained. Prior Anticoagulants: The patient                            last took Xarelto (rivaroxaban) 2 days prior to the                            procedure. ASA Grade Assessment: III - A patient                            with severe systemic disease. After reviewing the                            risks and benefits, the patient was deemed in                            satisfactory condition to undergo the procedure.                           After obtaining informed consent, the colonoscope  was passed under direct vision. Throughout the                            procedure, the patient's blood pressure, pulse, and                            oxygen saturations were monitored continuously. The                            Olympus CF-HQ190L ([id number]) Colonoscope was                            introduced through the anus and advanced to the the                            cecum, identified by  appendiceal orifice and                            ileocecal valve. The patient tolerated the                            procedure well. The colonoscopy was somewhat                            difficult due to significant looping. Successful                            completion of the procedure was aided by applying                            abdominal pressure. The ileocecal valve,                            appendiceal orifice, and rectum were photographed.                            The bowel preparation used was SUPREP via split                            dose instruction. The quality of the bowel                            preparation was excellent. Scope In: 7:43:02 AM Scope Out: 8:02:16 AM Scope Withdrawal Time: 0 hours 8 minutes 51 seconds  Total Procedure Duration: 0 hours 19 minutes 14 seconds  Findings:                 Hemorrhoids were found on rectal exam.                           External hemorrhoids were found.                           A 3 mm polyp was found in the transverse colon. The  polyp was sessile. The polyp was removed with a                            cold snare. Resection and retrieval were complete.                            Verification of patient identification for the                            specimen was done. Estimated blood loss was minimal.                           A 1 mm polyp was found in the transverse colon. The                            polyp was sessile. The polyp was removed with a                            cold biopsy forceps. Resection and retrieval were                            complete. Verification of patient identification                            for the specimen was done. Estimated blood loss was                            minimal.                           Scattered diverticula were found in the sigmoid                            colon and ascending colon.                           The exam was  otherwise without abnormality on                            direct and retroflexion views. Complications:            No immediate complications. Estimated Blood Loss:     Estimated blood loss was minimal. Impression:               - Hemorrhoids found on perianal exam.                           - External hemorrhoids.                           - One 3 mm polyp in the transverse colon, removed                            with a cold snare. Resected and retrieved.                           -  One 1 mm polyp in the transverse colon, removed                            with a cold biopsy forceps. Resected and retrieved.                           - Diverticulosis in the sigmoid colon and in the                            ascending colon.                           - The examination was otherwise normal on direct                            and retroflexion views. Recommendation:           - Patient has a contact number available for                            emergencies. The signs and symptoms of potential                            delayed complications were discussed with the                            patient. Return to normal activities tomorrow.                            Written discharge instructions were provided to the                            patient.                           - Resume previous diet.                           - Continue present medications.                           - Resume Xarelto (rivaroxaban) at prior dose                            tomorrow.                           - Await pathology results.                           - No recommendation at this time regarding repeat                            colonoscopy due to age.                           - Treat hemorrhoids with hydrocortisone  supoositories, OTC 5% lidocaine, sitz baths                           if persistent issues I will reassess - problems                            seem  associated with external hemorrhoids and                            banding of internal hemorrhoids again unlikely to                            help. Gatha Mayer, MD 07/28/2020 8:15:10 AM This report has been signed electronically.

## 2020-07-28 NOTE — Progress Notes (Signed)
Pt's states no medical or surgical changes since previsit or office visit.  CW vitals and JD IV. 

## 2020-07-28 NOTE — Progress Notes (Signed)
Called to room to assist during endoscopic procedure.  Patient ID and intended procedure confirmed with present staff. Received instructions for my participation in the procedure from the performing physician.  

## 2020-07-28 NOTE — Progress Notes (Signed)
PT taken to PACU. Monitors in place. VSS. Report given to RN. 

## 2020-07-29 ENCOUNTER — Encounter: Payer: Medicare HMO | Admitting: Internal Medicine

## 2020-08-01 ENCOUNTER — Telehealth: Payer: Self-pay | Admitting: *Deleted

## 2020-08-01 NOTE — Telephone Encounter (Signed)
  Follow up Call-  Call back number 07/28/2020 10/08/2018  Post procedure Call Back phone  # [phone]  Permission to leave phone message Yes -  Some recent data might be hidden     Patient questions:  Do you have a fever, pain , or abdominal swelling? No. Pain Score  0 *  Have you tolerated food without any problems? Yes.    Have you been able to return to your normal activities? Yes.    Do you have any questions about your discharge instructions: Diet   No. Medications  No. Follow up visit  No.  Do you have questions or concerns about your Care? No.  Actions: * If pain score is 4 or above: No action needed, pain <4.  1. Have you developed a fever since your procedure? no  2.   Have you had an respiratory symptoms (SOB or cough) since your procedure? no  3.   Have you tested positive for COVID 19 since your procedure no  4.   Have you had any family members/close contacts diagnosed with the COVID 19 since your procedure?  no   If yes to any of these questions please route to Joylene John, RN and Joella Prince, RN

## 2020-08-09 ENCOUNTER — Ambulatory Visit: Payer: Medicare HMO | Admitting: Family Medicine

## 2020-08-10 ENCOUNTER — Encounter: Payer: Self-pay | Admitting: Internal Medicine

## 2020-08-12 ENCOUNTER — Ambulatory Visit (INDEPENDENT_AMBULATORY_CARE_PROVIDER_SITE_OTHER): Payer: Medicare HMO | Admitting: Family Medicine

## 2020-08-12 ENCOUNTER — Telehealth: Payer: Self-pay | Admitting: Family Medicine

## 2020-08-12 ENCOUNTER — Encounter: Payer: Self-pay | Admitting: Family Medicine

## 2020-08-12 ENCOUNTER — Other Ambulatory Visit: Payer: Self-pay

## 2020-08-12 VITALS — BP 106/60 | HR 61 | Temp 98.1°F | Wt 260.0 lb

## 2020-08-12 DIAGNOSIS — R42 Dizziness and giddiness: Secondary | ICD-10-CM | POA: Diagnosis not present

## 2020-08-12 DIAGNOSIS — I10 Essential (primary) hypertension: Secondary | ICD-10-CM

## 2020-08-12 DIAGNOSIS — I4819 Other persistent atrial fibrillation: Secondary | ICD-10-CM | POA: Diagnosis not present

## 2020-08-12 NOTE — Chronic Care Management (AMB) (Signed)
  Chronic Care Management   Note  08/12/2020 Name: Erik [name] MRN: [id number] DOB: 08/21/1945  Erik [name] is a 75 y.o. year old male who is a primary care patient of [name], MD. I reached out to [name] by phone today in response to a referral sent by Erik [name] PCP, [name], MD.   [name] was given information about Chronic Care Management services today including:  CCM service includes personalized support from designated clinical staff supervised by his physician, including individualized plan of care and coordination with other care providers 24/7 contact phone numbers for assistance for urgent and routine care needs. Service will only be billed when office clinical staff spend 20 minutes or more in a month to coordinate care. Only one practitioner may furnish and bill the service in a calendar month. The patient may stop CCM services at any time (effective at the end of the month) by phone call to the office staff.   Carolin Coy verbally agreed to assistance and services provided by embedded care coordination/care management team today.  Follow up plan:   Tatjana Secretary/administrator

## 2020-08-12 NOTE — Progress Notes (Signed)
   Subjective:    Patient ID: Erik [name], male    DOB: [birth year], 75 y.o.   MRN: [id number]  HPI Here to describe brief spells of "dizziness" that started about one year ago, but which have become more frequent. He now has them 3-4 times a day. They only last 10-15 seconds, and they usually come after he stands up from a sitting position or when he crouches forward. There is no associated SOB or chest pain or nausea or sweats. No headaches or other neurologic deficits. His BP at home in the past few weeks has averaged in the 100s over 60s with pulses in the 60s. He very seldom feels any fibrillation, and he has never taken one of his prn 30 mg Diltiazem pills.    Review of Systems  Constitutional: Negative.   Respiratory: Negative.    Cardiovascular: Negative.   Neurological:  Positive for dizziness and light-headedness. Negative for tremors, seizures, syncope, facial asymmetry, speech difficulty, weakness, numbness and headaches.      Objective:   Physical Exam Constitutional:      Appearance: He is obese. He is not ill-appearing.  Cardiovascular:     Rate and Rhythm: Normal rate and regular rhythm.     Pulses: Normal pulses.     Heart sounds: Normal heart sounds.  Pulmonary:     Effort: Pulmonary effort is normal.     Breath sounds: Normal breath sounds.  Musculoskeletal:     Right lower leg: No edema.     Left lower leg: No edema.  Neurological:     General: No focal deficit present.     Mental Status: He is alert. Mental status is at baseline.          Assessment & Plan:  Spells of dizziness which sound like orthostasis. He is is sinus rhythm today. His BP tends to be quite low we will decrease the dose of Losartan to 50 mg a day (he will take 1/2 tablet) for 2 weeks. They will monitor this and report back in 2 weeks.  [name], MD

## 2020-08-15 ENCOUNTER — Telehealth: Payer: Self-pay | Admitting: Internal Medicine

## 2020-08-15 DIAGNOSIS — K625 Hemorrhage of anus and rectum: Secondary | ICD-10-CM

## 2020-08-15 NOTE — Telephone Encounter (Signed)
I called the patient  This bleeding is really what he has been dealing with for months and is probably hemorrhoidal based upon his work-up.  Colonoscopy 18 days ago with 2 diminutive polyps removed is not the cause of the bleeding in my opinion.  I have ordered a CBC and he will come for that today and then he wants me to try to band his hemorrhoids again.  I talked to him about possibly seeing surgery but he is hoping for banding to fix this.  I am not sure its will but it is probably reasonable to try.   He takes Xarelto and I think he needs to stay on that for right now level of CBC may make a difference.   When I see the CBC results I/we will call him back.

## 2020-08-15 NOTE — Telephone Encounter (Signed)
Pts wife states pt had a colon done 07/28/20. Reports he had some bleeding after the procedure but she states it has progressively gotten worse. States there is so much blood in the toilet you cannot see the bottom of the bowl. Also states he is having some bleeding even when not having a BM. Reports his stomach hurts some but pt thinks that is due to a laxative he took. Discussed with them that if he is having that much bleeding he should be seen in the ER. Pt states he does not feel he needs to go to the ER. Pt wants to know what Dr. Carlean Purl recommends. Please advise.

## 2020-08-15 NOTE — Telephone Encounter (Signed)
Patients wife called and she the patient is having a lot of blood coming out with every bowel movement and also when he is not.

## 2020-08-15 NOTE — Telephone Encounter (Signed)
Noted  

## 2020-08-16 ENCOUNTER — Other Ambulatory Visit (INDEPENDENT_AMBULATORY_CARE_PROVIDER_SITE_OTHER): Payer: Medicare HMO

## 2020-08-16 DIAGNOSIS — K625 Hemorrhage of anus and rectum: Secondary | ICD-10-CM

## 2020-08-16 LAB — CBC
HCT: 35.2 % — ABNORMAL LOW (ref 39.0–52.0)
Hemoglobin: 12 g/dL — ABNORMAL LOW (ref 13.0–17.0)
MCHC: 34.2 g/dL (ref 30.0–36.0)
MCV: 84.1 fl (ref 78.0–100.0)
Platelets: 227 10*3/uL (ref 150.0–400.0)
RBC: 4.19 Mil/uL — ABNORMAL LOW (ref 4.22–5.81)
RDW: 13.9 % (ref 11.5–15.5)
WBC: 6.1 10*3/uL (ref 4.0–10.5)

## 2020-08-18 NOTE — Telephone Encounter (Signed)
Left message on voice mail for pt and also sent pt a mychart message.

## 2020-08-18 NOTE — Telephone Encounter (Signed)
The reason I recommended holding it that long was because of the bleeding he was having in the decline in hemoglobin though it was only a little bit.  These are situations where we have to sort through it as we go along.  If the bleeding has lessened he can restart it.  It is a balance between the bleeding and stroke prevention and atrial fibrillation.  Risk of stroke is very unlikely though not 0.  If he thinks the bleeding is less restart today or tomorrow

## 2020-08-18 NOTE — Telephone Encounter (Signed)
Inbound call from patient. States he was told to stop taking his Xarelto on Tuesday 6/21 until his appointment. He is questioning whether that is correct because he have never  been off his blood thinners this long.  Best contact number [phone]

## 2020-08-18 NOTE — Telephone Encounter (Signed)
Pt calling questioning holding his xarelto. States he usually does not hold it this long and he wants to make sure he is still supposed to be holding. Please advise.

## 2020-08-19 ENCOUNTER — Telehealth: Payer: Self-pay | Admitting: Family Medicine

## 2020-08-19 NOTE — Telephone Encounter (Signed)
Spoke with pts wife and reviewed Dr. Celesta Aver recommendation. He has stopped bleeding so he will resume the xarelto today.

## 2020-08-19 NOTE — Telephone Encounter (Signed)
Pt call and stated his BP reading for today the High  is 164/76 and the low is 117/159.He also stated the low dose of  losartan (COZAAR) didn't change his dizziness .

## 2020-08-19 NOTE — Telephone Encounter (Signed)
Inbound call from patient wife, Candance. States the bleeding stopped and wants to know when he can start Xarelto? Best contact number [phone]

## 2020-08-19 NOTE — Telephone Encounter (Signed)
FYI Spoke with Pt state that he wanted to update you on his BP reading and his dizziness, state that he is better but is still having dizziness.

## 2020-08-22 ENCOUNTER — Other Ambulatory Visit: Payer: Self-pay

## 2020-08-22 MED ORDER — DILTIAZEM HCL ER COATED BEADS 120 MG PO CP24: 120.0000 mg | ORAL_CAPSULE | Freq: Every day | ORAL | 2 refills | Status: DC

## 2020-08-22 NOTE — Telephone Encounter (Signed)
I understand. Tell him to keep taking 1/2 tab daily of Losartan. We will stop the Diltiazem CD 180 mg and start on Diltiazem CD 120 mg daily instead. Call in #30 with 2 rf. Report back in 2 weeks

## 2020-08-22 NOTE — Telephone Encounter (Signed)
Spoke with pt reviewed Dr Sarajane Jews recommendation with pt, verbalized understanding. New Rx was sent to pt pharmacy, pt is aware

## 2020-08-23 ENCOUNTER — Encounter: Payer: Self-pay | Admitting: Internal Medicine

## 2020-08-23 ENCOUNTER — Ambulatory Visit: Payer: Medicare HMO | Admitting: Internal Medicine

## 2020-08-23 VITALS — BP 120/68 | HR 68 | Ht 69.0 in | Wt 257.6 lb

## 2020-08-23 DIAGNOSIS — K644 Residual hemorrhoidal skin tags: Secondary | ICD-10-CM | POA: Diagnosis not present

## 2020-08-23 DIAGNOSIS — K648 Other hemorrhoids: Secondary | ICD-10-CM | POA: Diagnosis not present

## 2020-08-23 MED ORDER — HYDROCORTISONE (PERIANAL) 2.5 % EX CREA: 1.0000 "application " | TOPICAL_CREAM | Freq: Two times a day (BID) | CUTANEOUS | 1 refills | Status: DC | PRN

## 2020-08-23 NOTE — Patient Instructions (Addendum)
We have sent the following medications to your pharmacy for you to pick up at your convenience: Hydrocortisone cream (you may apply this to an over the counter glycerin suppository and apply within the rectum. This should be as effective as the more expensive hydrocortisone suppositories)  Continue Metamucil.  Follow up with Dr Carlean Purl as needed.  If you are age 73 or older, your body mass index should be between 23-30. Your Body mass index is 38.04 kg/m. If this is out of the aforementioned range listed, please consider follow up with your Primary Care Provider. _______________________________________________________ The [facility] providers would like to encourage you to use [facility] to communicate with providers for non-urgent requests or questions.  Due to long hold times on the telephone, sending your provider a message by [facility] may be a faster and more efficient way to get a response.  Please allow 48 business hours for a response.  Please remember that this is for non-urgent requests.   Due to recent changes in healthcare laws, you may see the results of your imaging and laboratory studies on MyChart before your provider has had a chance to review them.  We understand that in some cases there may be results that are confusing or concerning to you. Not all laboratory results come back in the same time frame and the provider may be waiting for multiple results in order to interpret others.  Please give [place] 48 hours in order for your provider to thoroughly review all the results before contacting the office for clarification of your results.   I appreciate the opportunity to care for you. Silvano Rusk, MD, [facility]

## 2020-08-23 NOTE — Progress Notes (Signed)
Erik [name] 75 y.o. 15-Oct-1945 [id number]  Assessment & Plan:   Encounter Diagnosis  Name Primary?   Internal and external bleeding hemorrhoids Yes   He needs to continue aggressive fiber supplementation fluids etc. and avoid straining and things that aggravate hemorrhoids.  Even with that he may have recurrent bleeding and certainly he will bleed more profusely than others because of his Xarelto.  He has a lot of spasm and a narrow canal and banding is tricky in him I think.  He is not interested in surgery, understandably so.  He is currently improved from where he was last week so he will use intermittent hydrocortisone either the suppositories or the cream placed on an over-the-counter suppository intermittently.  If he goes on to have persistent bleeding I do think he should be evaluated by a surgeon to see what they might offer and to get their opinion.  He is not interested in that right now and I understand.   I appreciate the opportunity to care for this patient. CC: [name], MD Dr. Scarlette Shorts    Subjective:   Chief Complaint: Bleeding hemorrhoids  HPI The patient presents after he called in last week, I had him stop his Xarelto he restarted it 3 or 4 days ago.  He was nervous about having a stroke understandably so.  However he had called with a lot of bleeding but it was about 2 weeks after his colonoscopy he was wondering if it was related to that.  He has had ongoing issues with hemorrhoids internal and external.  He had used some hemorrhoidal suppositories with hydrocortisone and has about 4 of those left out of the prescription for 24. After the profuse bleeding last week he says the bleeding is "99% gone".   He is trying to take his Metamucil daily.  He is not sure how much that is helping.  He does have a history of straining to stool etc. but does seem to indicate he does better with Metamucil.  He says raisin bran works very well but as we discussed  in the past that has way too much sugar in it. Colonoscopy July 28, 2020 - Hemorrhoids found on perianal exam. - External hemorrhoids. - One 3 mm polyp in the transverse colon, removed with a cold snare. Resected and retrieved. - One 1 mm polyp in the transverse colon, removed with a cold biopsy forceps. Resected and retrieved. - Diverticulosis in the sigmoid colon and in the ascending colon. - The examination was otherwise normal on direct and retroflexion views. Polyps were adenomas  CBC Latest Ref Rng & Units 08/16/2020 08/21/2019 01/14/2019  WBC 4.0 - 10.5 K/uL 6.1 7.0 9.9  Hemoglobin 13.0 - 17.0 g/dL 12.0(L) 14.1 14.7  Hematocrit 39.0 - 52.0 % 35.2(L) 43.2 42.8  Platelets 150.0 - 400.0 K/uL 227.0 260 250.0     No Known Allergies Current Meds  Medication Sig   acetaminophen (TYLENOL) 500 MG tablet Take 1,000 mg by mouth every 6 (six) hours as needed for moderate pain.    diltiazem (CARDIZEM CD) 120 MG 24 hr capsule Take 1 capsule (120 mg total) by mouth daily.   furosemide (LASIX) 40 MG tablet TAKE 1 TABLET(40 MG) BY MOUTH DAILY (Patient taking differently: as needed. TAKE 1 TABLET(40 MG) BY MOUTH DAILY)   gabapentin (NEURONTIN) 600 MG tablet TAKE 1 TABLET(600 MG) BY MOUTH THREE TIMES DAILY   hydrochlorothiazide (HYDRODIURIL) 25 MG tablet TAKE 1 TABLET(25 MG) BY MOUTH DAILY  hydrocortisone (ANUSOL-HC) 2.5 % rectal cream Place 1 application rectally 2 (two) times daily as needed for hemorrhoids. May apply with OTC hemorrhoid suppository   hydrocortisone (ANUSOL-HC) 25 MG suppository Place 1 suppository (25 mg total) rectally at bedtime.   levothyroxine (SYNTHROID) 50 MCG tablet TAKE 1 TABLET BY MOUTH EVERY DAY   losartan (COZAAR) 100 MG tablet TAKE 1 TABLET BY MOUTH DAILY (Patient taking differently: 50 mg.)   potassium chloride (KLOR-CON) 10 MEQ tablet TAKE 1 TABLET(10 MEQ) BY MOUTH DAILY   simvastatin (ZOCOR) 20 MG tablet TAKE 1 TABLET BY MOUTH EVERY DAY   tamsulosin (FLOMAX) 0.4  MG CAPS capsule TAKE ONE CAPSULE BY MOUTH EVERY DAY   XARELTO 20 MG TABS tablet TAKE 1 TABLET BY MOUTH DAILY WITH SUPPER   Past Medical History:  Diagnosis Date   Anxiety    Arthritis    Atypical chest pain 06/26/2015   BPH with urinary obstruction    Carotid disease, bilateral ([place])    a. 09/2014 Carotid U/S: 1-39% bilat ICA stenosis.   Chronic lower back pain    CKD (chronic kidney disease), stage III ([place])    pt. denies   Diverticulitis 12/2016   Diverticulosis    Dyspnea    at rest and exertion   Gout    "couple days/year" (05/17/2017)   History of kidney stones    Hx of adenomatous colonic polyps 07/2020   2 diminutive   Hyperlipidemia    Hypertension    Hypertensive heart disease    Hypothyroidism    Internal hemorrhoids    Long QT interval 06/26/2015   Nephrolithiasis    Neuropathy 07/30/2017   Non-obstructive CAD    a. 07/2002 Cath: LM 20, LAD 87m/d, LCX 50-83m, OM1 4m, RCA 52m, EF 60%; b. 05/2014 MV: low risk w/ small sized, mild intensity rev defect in apical/inferior/infsept area, nl EF->Med Rx.   Panic attack    Paroxysmal atrial fibrillation ([place])    a. Dx 04/2014; b. 05/2014 Echo: Ef 60-65%, no rwma, triv MR/TR, nl RV;  c. CHA2DS2VASc = 3-->was on eliquis, switched to xarelto 04/2015 2/2 cost; d. 05/2015 Tikosyn loaded w/ conversion to AF; e. 06/2015 QTc prolongation and bradycardia->tikosyn d/c'd, PPM placed, Amio started; f. 07/01/2015 In Aflutter @ clinic f/u.   Paroxysmal atrial flutter ([place])    a. 06/2015 noted to be in rapid Aflutter in Afib clinic-->amio load continued.   Persistent atrial fibrillation ([place]) 06/21/2015   Presence of permanent cardiac pacemaker    [employer]   Prolapsed internal hemorrhoids, grade 2-3 10/19/2016   LL Gr 3 and RP/RA Gr 2 LL banded 10/19/2016    RLL pneumonia 11/05/2012   Sleep apnea    "can't tolerate mask" (05/17/2017)   Tachy-brady syndrome ([place])    a. 06/27/2015 s/p SJM DC PPM (ser # @ [id number]).   Past Surgical History:   Procedure Laterality Date   ATRIAL FIBRILLATION ABLATION N/A 05/17/2017   Procedure: ATRIAL FIBRILLATION ABLATION;  Surgeon: Constance Haw, MD;  Location: [facility];  Service: Cardiovascular;  Laterality: N/A;   BACK SURGERY     CARDIAC CATHETERIZATION  ~ 2000   CLOSED REDUCTION HAND FRACTURE Right 1984   COLONOSCOPY  06/27/2015   per Dr. Henrene Pastor, internal hemorrhoids and diverticulae, repeat 10 yrs   CYSTOSCOPY WITH RETROGRADE PYELOGRAM, URETEROSCOPY AND STENT PLACEMENT Bilateral 02/14/2018   Procedure: CYSTOSCOPY WITH RETROGRADE PYELOGRAM, URETEROSCOPY AND STENT PLACEMENT;  Surgeon: Alexis Frock, MD;  Location: [facility];  Service: Urology;  Laterality: Bilateral;   EP IMPLANTABLE DEVICE N/A 06/27/2015   Procedure: Pacemaker Implant;  Surgeon: Evans Lance, MD;  Location: [facility];  Service: Cardiovascular;  Laterality: N/A;   EYE SURGERY  08/03/2019   Cataract   EYE SURGERY  08/17/2019   Cataact   FRACTURE SURGERY     right hand,finger   HEMORRHOID BANDING     HOLMIUM LASER APPLICATION Bilateral 69/45/0388   Procedure: HOLMIUM LASER APPLICATION;  Surgeon: Alexis Frock, MD;  Location: [facility];  Service: Urology;  Laterality: Bilateral;   INSERT / REPLACE / REMOVE PACEMAKER     LACERATION REPAIR Right 1984   "hand"   POSTERIOR LUMBAR FUSION  1998   2 lumbar discs, Dr. Ellene Route; "ray cages"   SKIN CANCER EXCISION  09/2019   TOTAL HIP ARTHROPLASTY Left 09/01/2019   Procedure: LEFT TOTAL HIP ARTHROPLASTY ANTERIOR APPROACH;  Surgeon: Melrose Nakayama, MD;  Location: [facility];  Service: Orthopedics;  Laterality: Left;   Social History   Social History Narrative   He is married and retired he has 1 child   family history includes Cancer in his paternal aunt; Dementia in his mother; Diabetes in his paternal grandmother; Stroke in his father and paternal aunt.   Review of Systems As above  Objective:   Physical Exam BP 120/68   Pulse 68   Ht 5\' 9"  (1.753  m)   Wt 257 lb 9.6 oz (116.8 kg)   BMI 38.04 kg/m    Rectal exam shows some visible external hemorrhoids small without bleeding, and anal spasm and stenosis he is uncomfortable with the exam there is no induration or sign of a fissure  Anoscopy shows grade 2 internal and external hemorrhoids with prominent superficial vascular features

## 2020-08-30 ENCOUNTER — Other Ambulatory Visit: Payer: Self-pay | Admitting: Cardiovascular Disease

## 2020-08-31 ENCOUNTER — Telehealth: Payer: Self-pay | Admitting: Cardiovascular Disease

## 2020-08-31 DIAGNOSIS — I4819 Other persistent atrial fibrillation: Secondary | ICD-10-CM

## 2020-08-31 NOTE — Telephone Encounter (Signed)
Prescription refill request for Xarelto received.  Indication:atrial fib Last office visit:11/21 Weight:116.8 kg Age:75 NVV:YXAJL labs CrCl:needs labs  Prescription refilled

## 2020-08-31 NOTE — Telephone Encounter (Signed)
Pt went to pharmacy to pick up script this morning and was advised to call the office and request an order for labs. Please advise pt further

## 2020-09-01 ENCOUNTER — Other Ambulatory Visit: Payer: Self-pay

## 2020-09-01 DIAGNOSIS — I4819 Other persistent atrial fibrillation: Secondary | ICD-10-CM | POA: Diagnosis not present

## 2020-09-01 NOTE — Telephone Encounter (Signed)
Called and lmomed the pt that they need to complete labs for further refills for dosing purposes

## 2020-09-02 LAB — COMPREHENSIVE METABOLIC PANEL
ALT: 13 IU/L (ref 0–44)
AST: 18 IU/L (ref 0–40)
Albumin/Globulin Ratio: 2 (ref 1.2–2.2)
Albumin: 4.4 g/dL (ref 3.7–4.7)
Alkaline Phosphatase: 63 IU/L (ref 44–121)
BUN/Creatinine Ratio: 10 (ref 10–24)
BUN: 14 mg/dL (ref 8–27)
Bilirubin Total: 0.4 mg/dL (ref 0.0–1.2)
CO2: 22 mmol/L (ref 20–29)
Calcium: 8.9 mg/dL (ref 8.6–10.2)
Chloride: 107 mmol/L — ABNORMAL HIGH (ref 96–106)
Creatinine, Ser: 1.41 mg/dL — ABNORMAL HIGH (ref 0.76–1.27)
Globulin, Total: 2.2 g/dL (ref 1.5–4.5)
Glucose: 98 mg/dL (ref 65–99)
Potassium: 4 mmol/L (ref 3.5–5.2)
Sodium: 144 mmol/L (ref 134–144)
Total Protein: 6.6 g/dL (ref 6.0–8.5)
eGFR: 53 mL/min/{1.73_m2} — ABNORMAL LOW (ref >59)

## 2020-09-05 ENCOUNTER — Telehealth: Payer: Self-pay

## 2020-09-05 NOTE — Telephone Encounter (Signed)
Lmom to call back for cmp results

## 2020-09-06 NOTE — Progress Notes (Signed)
BMET ordered to verify Xarelto dose. Last BMET > 75 year old.  Scr = 1.41 Est CrCl = 77  Okay to refill Rx for Xarelto 20mg  daily

## 2020-09-07 ENCOUNTER — Telehealth: Payer: Self-pay

## 2020-09-07 NOTE — Telephone Encounter (Signed)
Called and lmomed the pt regarding the results

## 2020-09-16 ENCOUNTER — Other Ambulatory Visit: Payer: Self-pay | Admitting: Family Medicine

## 2020-09-20 ENCOUNTER — Ambulatory Visit (INDEPENDENT_AMBULATORY_CARE_PROVIDER_SITE_OTHER): Payer: Medicare HMO

## 2020-09-20 DIAGNOSIS — I495 Sick sinus syndrome: Secondary | ICD-10-CM

## 2020-09-21 LAB — CUP PACEART REMOTE DEVICE CHECK
Battery Remaining Longevity: 59 mo
Battery Remaining Percentage: 53 %
Battery Voltage: 2.98 V
Brady Statistic AP VP Percent: 2 %
Brady Statistic AP VS Percent: 76 %
Brady Statistic AS VP Percent: 1 %
Brady Statistic AS VS Percent: 19 %
Brady Statistic RA Percent Paced: 72 %
Brady Statistic RV Percent Paced: 2 %
Date Time Interrogation Session: 20220727010237
Implantable Lead Implant Date: 20170501
Implantable Lead Implant Date: 20170501
Implantable Lead Location: 753859
Implantable Lead Location: 753860
Implantable Pulse Generator Implant Date: 20170501
Lead Channel Impedance Value: 400 Ohm
Lead Channel Impedance Value: 530 Ohm
Lead Channel Pacing Threshold Amplitude: 0.75 V
Lead Channel Pacing Threshold Amplitude: 0.875 V
Lead Channel Pacing Threshold Pulse Width: 0.5 ms
Lead Channel Pacing Threshold Pulse Width: 0.5 ms
Lead Channel Sensing Intrinsic Amplitude: 3.3 mV
Lead Channel Sensing Intrinsic Amplitude: 9 mV
Lead Channel Setting Pacing Amplitude: 1.875
Lead Channel Setting Pacing Amplitude: 2.5 V
Lead Channel Setting Pacing Pulse Width: 0.5 ms
Lead Channel Setting Sensing Sensitivity: 2 mV
Pulse Gen Model: 2272
Pulse Gen Serial Number: 7897836

## 2020-09-29 ENCOUNTER — Other Ambulatory Visit: Payer: Self-pay

## 2020-09-29 MED ORDER — RIVAROXABAN 20 MG PO TABS: 20.0000 mg | ORAL_TABLET | Freq: Every day | ORAL | 0 refills | Status: DC

## 2020-09-29 MED ORDER — RIVAROXABAN 20 MG PO TABS: 20.0000 mg | ORAL_TABLET | Freq: Every day | ORAL | 1 refills | Status: DC

## 2020-09-29 NOTE — Telephone Encounter (Signed)
Prescription refill request for Xarelto received.   Indication: afib  Last office visit: [place], 01/18/2020 Weight: 116.8 kg  Age:75 yo  Scr: 1.41, 09/01/2020  CrCl: 77 ml/min   Pt is on the correct dose of Xarelto per dosing criteria, prescription refill sent.

## 2020-10-13 ENCOUNTER — Telehealth: Payer: Self-pay | Admitting: Pharmacist

## 2020-10-13 NOTE — Chronic Care Management (AMB) (Signed)
Chronic Care Management Pharmacy Assistant   Name: Erik [name]  MRN: [id number] DOB: 1945-06-03  Reason for Encounter: Chart review for Initial appointment with [name] Clinical Pharmacist on 10-19-2020 at 2 pm via phone call.   Conditions to be addressed/monitored: CAD, HTN, HLD, and CKD Stage 3  Recent office visits:  08-12-2020 [name], MD- Patient presented for Dizziness and other concerns. Stopped Prednisone 10 MG  Recent consult visits:  08-23-2020 Gatha Mayer, MD (Gastroenterology) - Patient presented for Internal and external bleeding hemorrhoids. Prescribed Hydrocortisone 2.5 % Stopped Diltiazem 30 MG  07-28-2020 Gatha Mayer, MD (Gastroenterology) - Patient presented for Colonoscopy  07-08-2020 Gatha Mayer, MD (Gastroenterology) - Patient presented for Rectal bleeding and other concerns. Stopped Tramadol 100 MG & Tizanidine 4 MG  06-21-2020 Simone Curia, RN (Cardiology) - Patient presented for Paceart remote device check. No medication changes.  06-15-2020 Gatha Mayer, MD (Gastroenterology) - Patient presented for Prolapsed internal hemorrhoids, grade 2-3. No medication changes.  Hospital visits:  None in previous 6 months  Medications: Outpatient Encounter Medications as of 10/13/2020  Medication Sig   acetaminophen (TYLENOL) 500 MG tablet Take 1,000 mg by mouth every 6 (six) hours as needed for moderate pain.    diltiazem (CARDIZEM CD) 120 MG 24 hr capsule Take 1 capsule (120 mg total) by mouth daily.   furosemide (LASIX) 40 MG tablet TAKE 1 TABLET(40 MG) BY MOUTH DAILY (Patient taking differently: as needed. TAKE 1 TABLET(40 MG) BY MOUTH DAILY)   gabapentin (NEURONTIN) 600 MG tablet TAKE 1 TABLET(600 MG) BY MOUTH THREE TIMES DAILY   hydrochlorothiazide (HYDRODIURIL) 25 MG tablet TAKE 1 TABLET(25 MG) BY MOUTH DAILY   hydrocortisone (ANUSOL-HC) 2.5 % rectal cream Place 1 application rectally 2 (two) times daily as needed for  hemorrhoids. May apply with OTC hemorrhoid suppository   hydrocortisone (ANUSOL-HC) 25 MG suppository Place 1 suppository (25 mg total) rectally at bedtime.   levothyroxine (SYNTHROID) 50 MCG tablet TAKE 1 TABLET BY MOUTH EVERY DAY   losartan (COZAAR) 100 MG tablet TAKE 1 TABLET BY MOUTH DAILY (Patient taking differently: 50 mg.)   potassium chloride (KLOR-CON) 10 MEQ tablet TAKE 1 TABLET(10 MEQ) BY MOUTH DAILY   rivaroxaban (XARELTO) 20 MG TABS tablet Take 1 tablet (20 mg total) by mouth daily with supper.   simvastatin (ZOCOR) 20 MG tablet TAKE 1 TABLET BY MOUTH EVERY DAY   tamsulosin (FLOMAX) 0.4 MG CAPS capsule TAKE ONE CAPSULE BY MOUTH EVERY DAY   No facility-administered encounter medications on file as of 10/13/2020.  Spoke to [employer] pt's wife for the call  Have you seen any other providers since your last visit? She reports he has seen no providers outside of the office recently. She states he has had colonoscopy done recently.  Any changes in your medications or health? She reports there are no changes.  Any side effects from any medications? She reports she has not noticed any side effects.  Do you have an symptoms or problems not managed by your medications? She reports his blood pressure medication finally seems to be helping a bit more but she does not think that his Lasix is working well at all for him, has not noticed much of a difference.  Any concerns about your health right now? She reports none.  Has your provider asked that you check blood pressure, blood sugar, or follow special diet at home? She reports he checking his blood pressures at home, was at  grocery store so did not have readings. She reports he does not have a very good diet, For breakfast he will have a protein shake does not eat much lunch and for dinner they try to stay away from carb's and enjoy baked vegetables and some fruit. She reports he is drinking water.  Do you get any type of exercise on a regular  basis? She reports he is very sedentary does not get around much spends most of the day a the computer. He is not using any assisted devices to get around.  Can you think of a goal you would like to reach for your health? She reported not at this time  Do you have any problems getting your medications? She reports they dislike the pharmacy they currently use find it difficult to reach someone timely over the phone and in person. She  reports they are considering switching to mail order for convenience.  Is there anything that you would like to discuss during the appointment? She reports no  She is aware to have available all medications and supplements as well as BP readings for the call. She is aware that they will receive a reminder call the day prior.    Fill History : furosemide (LASIX) tablet 07/10/2020 90   gabapentin (NEURONTIN) tablet 06/18/2020 90   hydrocortisone (ANUSOL-HC) rectal cream 2.5% 08/23/2020 15   levothyroxine (SYNTHROID, LEVOTHROID) tablet 07/10/2020 90   losartan (COZAAR) tablet 07/15/2020 90   potassium chloride (K-DUR,KLOR-CON) CR tablet 07/12/2020 90   XARELTO 20 MG PO TABS 08/31/2020 30   simvastatin (ZOCOR) tablet 07/11/2020 90   tamsulosin (FLOMAX) capsule 0.4 mg 07/10/2020 90   diltiazem (CARDIZEM CD) 24 hr capsule 08/22/2020 90   Care Gaps: Zoster Vaccine - Overdue TDAP - Overdue COVID Booster#4 [employer]) - Overdue Flu Vaccine - Overdue AWV - Scheduled for 03-08-2021  Star Rating Drugs: Losartan (Cozaar) 100 mg - Last filled 07-15-2020 90 DS at [facility] Simvastatin (Zocor) 20 mg - Last filled 07-11-2020 90 DS at [facility] Pharmacist Assistant [phone]

## 2020-10-17 NOTE — Progress Notes (Signed)
Remote pacemaker transmission.   

## 2020-10-18 ENCOUNTER — Telehealth: Payer: Self-pay | Admitting: Pharmacist

## 2020-10-18 NOTE — Chronic Care Management (AMB) (Signed)
    Chronic Care Management Pharmacy Assistant   Name: Erik [name]  MRN: [id number] DOB: 04/26/1945 10-18-2020 APPOINTMENT REMINDER   Called [name], No answer, left message of appointment on 10-19-2020 at 2:00 via telephone visit with [name], Pharm D. Notified to have all medications, supplements, blood pressure and/or blood sugar logs available during appointment and to return call if need to reschedule.  Care Gaps: Zoster Vaccine - Overdue TDAP - Overdue COVID Booster#4 Therapist, music) - Overdue Flu Vaccine - Overdue AWV - Scheduled for 03-08-2021  Star Rating Drug: Losartan (Cozaar) 100 mg - Last filled 07-15-2020 90 DS at [facility] Simvastatin (Zocor) 20 mg - Last filled 07-11-2020 90 DS at [facility]  Any gaps in medications fill history? Yes Call to [facility] to verify spoke to [name] Losartan last filled 10-14-2020 90 DS Simvastatin last filled 10-14-2020 90 DS   Medications: Outpatient Encounter Medications as of 10/18/2020  Medication Sig   acetaminophen (TYLENOL) 500 MG tablet Take 1,000 mg by mouth every 6 (six) hours as needed for moderate pain.    diltiazem (CARDIZEM CD) 120 MG 24 hr capsule Take 1 capsule (120 mg total) by mouth daily.   furosemide (LASIX) 40 MG tablet TAKE 1 TABLET(40 MG) BY MOUTH DAILY (Patient taking differently: as needed. TAKE 1 TABLET(40 MG) BY MOUTH DAILY)   gabapentin (NEURONTIN) 600 MG tablet TAKE 1 TABLET(600 MG) BY MOUTH THREE TIMES DAILY   hydrochlorothiazide (HYDRODIURIL) 25 MG tablet TAKE 1 TABLET(25 MG) BY MOUTH DAILY   hydrocortisone (ANUSOL-HC) 2.5 % rectal cream Place 1 application rectally 2 (two) times daily as needed for hemorrhoids. May apply with OTC hemorrhoid suppository   hydrocortisone (ANUSOL-HC) 25 MG suppository Place 1 suppository (25 mg total) rectally at bedtime.   levothyroxine (SYNTHROID) 50 MCG tablet TAKE 1 TABLET BY MOUTH EVERY DAY   losartan (COZAAR) 100 MG tablet TAKE 1 TABLET BY MOUTH DAILY (Patient  taking differently: 50 mg.)   potassium chloride (KLOR-CON) 10 MEQ tablet TAKE 1 TABLET(10 MEQ) BY MOUTH DAILY   rivaroxaban (XARELTO) 20 MG TABS tablet Take 1 tablet (20 mg total) by mouth daily with supper.   simvastatin (ZOCOR) 20 MG tablet TAKE 1 TABLET BY MOUTH EVERY DAY   tamsulosin (FLOMAX) 0.4 MG CAPS capsule TAKE ONE CAPSULE BY MOUTH EVERY DAY   No facility-administered encounter medications on file as of 10/18/2020.   [place] Clinical Pharmacist Assistant [phone]

## 2020-10-19 ENCOUNTER — Ambulatory Visit: Payer: Medicare HMO | Admitting: Pharmacist

## 2020-10-19 DIAGNOSIS — I4819 Other persistent atrial fibrillation: Secondary | ICD-10-CM

## 2020-10-19 DIAGNOSIS — I10 Essential (primary) hypertension: Secondary | ICD-10-CM

## 2020-10-19 NOTE — Progress Notes (Unsigned)
Chronic Care Management Pharmacy Note  10/19/2020 Name:  Erik [name] MRN:  [id number] DOB:  23-Jul-1945  Summary: ***  Recommendations/Changes made from today's visit: ***  Plan: ***   Subjective: Erik [name] is an 75 y.o. year old male who is a primary patient of [name], MD.  The CCM team was consulted for assistance with disease management and care coordination needs.    Engaged with patient by telephone for initial visit in response to provider referral for pharmacy case management and/or care coordination services.   Consent to Services:  The patient was given the following information about Chronic Care Management services today, agreed to services, and gave verbal consent: 1. CCM service includes personalized support from designated clinical staff supervised by the primary care provider, including individualized plan of care and coordination with other care providers 2. 24/7 contact phone numbers for assistance for urgent and routine care needs. 3. Service will only be billed when office clinical staff spend 20 minutes or more in a month to coordinate care. 4. Only one practitioner may furnish and bill the service in a calendar month. 5.The patient may stop CCM services at any time (effective at the end of the month) by phone call to the office staff. 6. The patient will be responsible for cost sharing (co-pay) of up to 20% of the service fee (after annual deductible is met). Patient agreed to services and consent obtained.  Patient Care Team: [name], MD as PCP - General (Family Medicine) Constance Haw, MD as PCP - Electrophysiology (Cardiology) Lorretta Harp, MD as PCP - Cardiology (Cardiology) Viona Gilmore, [facility] as Pharmacist (Pharmacist)  Recent office visits: 08-12-2020 [name], MD- Patient presented for Dizziness and other concerns. Stopped Prednisone 10 MG  Recent consult visits: 08-23-2020 Gatha Mayer, MD (Gastroenterology)  - Patient presented for Internal and external bleeding hemorrhoids. Prescribed Hydrocortisone 2.5 % Stopped Diltiazem 30 MG   07-28-2020 Gatha Mayer, MD (Gastroenterology) - Patient presented for Colonoscopy   07-08-2020 Gatha Mayer, MD (Gastroenterology) - Patient presented for Rectal bleeding and other concerns. Stopped Tramadol 100 MG & Tizanidine 4 MG   06-21-2020 Simone Curia, RN (Cardiology) - Patient presented for Paceart remote device check. No medication changes.   06-15-2020 Gatha Mayer, MD (Gastroenterology) - Patient presented for Prolapsed internal hemorrhoids, grade 2-3. No medication changes.  Hospital visits: None in previous 6 months   Objective:  Lab Results  Component Value Date   CREATININE 1.41 (H) 09/01/2020   BUN 14 09/01/2020   GFR 50.25 (L) 01/14/2019   GFRNONAA 54 (L) 08/21/2019   GFRAA >60 08/21/2019   NA 144 09/01/2020   K 4.0 09/01/2020   CALCIUM 8.9 09/01/2020   CO2 22 09/01/2020   GLUCOSE 98 09/01/2020    Lab Results  Component Value Date/Time   HGBA1C 5.5 07/23/2017 01:29 PM   HGBA1C 5.1 06/03/2006 09:21 AM   GFR 50.25 (L) 01/14/2019 03:46 PM   GFR 55.95 (L) 07/23/2017 01:29 PM    Last diabetic Eye exam: No results found for: HMDIABEYEEXA  Last diabetic Foot exam: No results found for: HMDIABFOOTEX   Lab Results  Component Value Date   CHOL 165 05/06/2019   HDL 52.40 05/06/2019   LDLCALC 84 05/06/2019   LDLDIRECT 104.9 06/09/2009   TRIG 144.0 05/06/2019   CHOLHDL 3 05/06/2019    Hepatic Function Latest Ref Rng & Units 09/01/2020 01/14/2019 01/24/2018  Total Protein 6.0 -  8.5 g/dL 6.6 7.1 6.9  Albumin 3.7 - 4.7 g/dL 4.4 4.6 4.2  AST 0 - 40 IU/L 18 12 23   ALT 0 - 44 IU/L 13 15 30   Alk Phosphatase 44 - 121 IU/L 63 45 51  Total Bilirubin 0.0 - 1.2 mg/dL 0.4 0.6 0.9  Bilirubin, Direct 0.0 - 0.3 mg/dL - 0.1 0.2    Lab Results  Component Value Date/Time   TSH 0.91 01/14/2019 03:46 PM   TSH 3.64 07/23/2017 01:29 PM    FREET4 0.98 01/14/2019 03:46 PM   FREET4 0.94 07/23/2017 01:29 PM    CBC Latest Ref Rng & Units 08/16/2020 08/21/2019 01/14/2019  WBC 4.0 - 10.5 K/uL 6.1 7.0 9.9  Hemoglobin 13.0 - 17.0 g/dL 12.0(L) 14.1 14.7  Hematocrit 39.0 - 52.0 % 35.2(L) 43.2 42.8  Platelets 150.0 - 400.0 K/uL 227.0 260 250.0    No results found for: VD25OH  Clinical ASCVD: Yes  The 10-year ASCVD risk score Mikey Bussing DC Jr., et al., 2013) is: 20.9%   Values used to calculate the score:     Age: 67 years     Sex: Male     Is Non-Hispanic African American: No     Diabetic: No     Tobacco smoker: No     Systolic Blood Pressure: 710 mmHg     Is BP treated: Yes     HDL Cholesterol: 52.4 mg/dL     Total Cholesterol: 165 mg/dL    Depression screen [facility] 2/9 03/02/2020 02/15/2017 08/25/2014  Decreased Interest 0 0 0  Down, Depressed, Hopeless 0 0 0  PHQ - 2 Score 0 0 0  Some recent data might be hidden   CHA2DS2/VAS Stroke Risk Points  Current as of 7 hours ago     3 >= 2 Points: High Risk  1 - 1.99 Points: Medium Risk  0 Points: Low Risk    Last Change: N/A      Details    This score determines the patient's risk of having a stroke if the  patient has atrial fibrillation.       Points Metrics  0 Has Congestive Heart Failure:  No    Current as of 7 hours ago  1 Has Vascular Disease:  Yes    Current as of 7 hours ago  1 Has Hypertension:  Yes    Current as of 7 hours ago  1 Age:  44    Current as of 7 hours ago  0 Has Diabetes:  No    Current as of 7 hours ago  0 Had Stroke:  No  Had TIA:  No  Had Thromboembolism:  No    Current as of 7 hours ago  0 Male:  No    Current as of 7 hours ago        Social History   Tobacco Use  Smoking Status Former   Packs/day: 2.00   Years: 28.00   Pack years: 56.00   Types: Cigarettes   Quit date: 02/27/1995   Years since quitting: 25.6  Smokeless Tobacco Never   BP Readings from Last 3 Encounters:  08/23/20 120/68  08/12/20 106/60  07/28/20 (!) 112/55    Pulse Readings from Last 3 Encounters:  08/23/20 68  08/12/20 61  07/28/20 60   Wt Readings from Last 3 Encounters:  08/23/20 257 lb 9.6 oz (116.8 kg)  08/12/20 260 lb (117.9 kg)  07/28/20 258 lb (117 kg)   BMI Readings from Last 3 Encounters:  08/23/20 38.04 kg/m  08/12/20 38.40 kg/m  07/28/20 38.10 kg/m    Assessment/Interventions: Review of patient past medical history, allergies, medications, health status, including review of consultants reports, laboratory and other test data, was performed as part of comprehensive evaluation and provision of chronic care management services.   SDOH:  (Social Determinants of Health) assessments and interventions performed: Yes  SDOH Screenings   Alcohol Screen: Not on file  Depression (PHQ2-9): Low Risk    PHQ-2 Score: 0  Financial Resource Strain: Low Risk    Difficulty of Paying Living Expenses: Not hard at all  Food Insecurity: No Food Insecurity   Worried About Charity fundraiser in the Last Year: Never true   Ran Out of Food in the Last Year: Never true  Housing: Low Risk    Last Housing Risk Score: 0  Physical Activity: Insufficiently Active   Days of Exercise per Week: 4 days   Minutes of Exercise per Session: 30 min  Social Connections: Moderately Isolated   Frequency of Communication with Friends and Family: More than three times a week   Frequency of Social Gatherings with Friends and Family: Never   Attends Religious Services: Never   Marine scientist or Organizations: No   Attends Music therapist: Never   Marital Status: Married  Stress: No Stress Concern Present   Feeling of Stress : Not at all  Tobacco Use: Medium Risk   Smoking Tobacco Use: Former   Smokeless Tobacco Use: Never  Transportation Needs: No Data processing manager (Medical): No   Lack of Transportation (Non-Medical): No   Patient's biggest concerns with his health right now is his back pain and  neuropathy. They do not allow him to do much and he is mostly sedentary throughout the day. He would like to play golf again but his back wont let him.  Patient is not doing any exercise mainly because of his back pain and does admit to some laziness. His wife is joining the [employer] through silver sneakers and he is thinking about joining her and doing swimming at [employer].  Patient generally sleeps well and sometimes more than he should 9-10 hours per night.  Patient feels tired during the day and doesn't seem to be napping during the day. Patient's wife says he does snore. Patient had a CPAP machine and he tried that for 3 months and he turned it back in. Patient did that 4 years ago. Patient doesn't want anything for CPAP.  Patient is not sure what all of his medications are for. Patient is using a weekly pillbox and he uses one for the morning, dinner and nighttime. Patient reports he never forgets the morning medications and did double up one time. Patient has not missed the Xarelto. Patient misses gabapentin and tamsulosin sometimes.  Patient does report some dizziness from his medications and it is usually from bending down and getting up, which causes dizziness. He also reports that within 30 seconds he is worn out when he exerts himself and was wondering if this was from medications.    CCM Care Plan  No Known Allergies  Medications Reviewed Today     Reviewed by Larina Bras, CMA (Certified Medical Assistant) on 08/23/20 at 12  Med List Status: <None>   Medication Order Taking? Sig Documenting Provider Last Dose Status Informant  acetaminophen (TYLENOL) 500 MG tablet [id number] Yes Take 1,000 mg by mouth every 6 (six) hours as needed for  moderate pain.  [provider] Taking Active Spouse/Significant Other  diltiazem (CARDIZEM CD) 120 MG 24 hr capsule [id number] Yes Take 1 capsule (120 mg total) by mouth daily. [name], MD Taking Active   furosemide (LASIX) 40 MG  tablet [id number] Yes TAKE 1 TABLET(40 MG) BY MOUTH DAILY  Patient taking differently: as needed. TAKE 1 TABLET(40 MG) BY MOUTH DAILY   [name], MD Taking Active   gabapentin (NEURONTIN) 600 MG tablet [id number] Yes TAKE 1 TABLET(600 MG) BY MOUTH THREE TIMES DAILY [name], MD Taking Active   hydrochlorothiazide (HYDRODIURIL) 25 MG tablet [id number] Yes TAKE 1 TABLET(25 MG) BY MOUTH DAILY [name], MD Taking Active   hydrocortisone (ANUSOL-HC) 25 MG suppository [id number] Yes Place 1 suppository (25 mg total) rectally at bedtime. Gatha Mayer, MD Taking Active   levothyroxine (SYNTHROID) 50 MCG tablet [id number] Yes TAKE 1 TABLET BY MOUTH EVERY DAY [name], MD Taking Active   losartan (COZAAR) 100 MG tablet [id number] Yes TAKE 1 TABLET BY MOUTH DAILY  Patient taking differently: 50 mg.   [name], MD Taking Active   potassium chloride (KLOR-CON) 10 MEQ tablet [id number] Yes TAKE 1 TABLET(10 MEQ) BY MOUTH DAILY Tommie Raymond, NP Taking Active   simvastatin (ZOCOR) 20 MG tablet [id number] Yes TAKE 1 TABLET BY MOUTH EVERY DAY Camnitz, Will Hassell Done, MD Taking Active   tamsulosin (FLOMAX) 0.4 MG CAPS capsule [id number] Yes TAKE ONE CAPSULE BY MOUTH EVERY DAY [name], MD Taking Active   XARELTO 20 MG TABS tablet [id number] Yes TAKE 1 TABLET BY MOUTH DAILY WITH SUPPER Lorretta Harp, MD Taking Active            Med Note Shiela Mayer Jul 28, 2020  7:18 AM) Last dose on 5/20 22             Patient Active Problem List   Diagnosis Date Noted   Ankle edema, bilateral 11/13/2019   Primary osteoarthritis of left hip 09/01/2019   Herpes zoster without complication 30/13/1438   Primary osteoarthritis of right hip 10/24/2018   Neuropathy 07/30/2017   Prolapsed internal hemorrhoids, grade 2-3 10/19/2016   Constipation 09/29/2015   CKD (chronic kidney disease), stage III ([place])    Dizziness 06/26/2015   Long QT interval 06/26/2015   Visit for monitoring  Tikosyn therapy 06/26/2015   Atypical chest pain 06/26/2015   Persistent atrial fibrillation ([place]) 06/21/2015   Hypertensive heart disease    Hypothyroidism    Non-obstructive CAD    Carotid disease, bilateral ([place])    OBESITY 09/20/2008   Hyperlipidemia 12/17/2005   Essential hypertension 12/17/2005    Immunization History  Administered Date(s) Administered   Influenza, High Dose Seasonal PF 02/15/2017, 12/10/2017, 12/07/2018   Influenza,inj,Quad PF,6+ Mos 11/16/2015   Influenza-Unspecified 11/23/2019   PFIZER(Purple Top)SARS-COV-2 Vaccination 03/23/2019, 04/13/2019, 11/26/2019   Pneumococcal Conjugate-13 03/03/2018   Pneumococcal Polysaccharide-23 05/06/2019   Td 06/14/2008   Zoster Recombinat (Shingrix) 07/11/2017   Zoster, Live 11/19/2011    Conditions to be addressed/monitored:  Hypertension, Hyperlipidemia, Atrial Fibrillation, Coronary Artery Disease, Chronic Kidney Disease, Hypothyroidism, Osteoarthritis, and BPH  There are no care plans that you recently modified to display for this patient.   Current Barriers:  {pharmacybarriers:24917}  Pharmacist Clinical Goal(s):  Patient will {PHARMACYGOALCHOICES:24921} through collaboration with PharmD and provider.   Interventions: 1:1 collaboration with [name], MD regarding development and update of comprehensive plan of care as evidenced by  provider attestation and co-signature Inter-disciplinary care team collaboration (see longitudinal plan of care) Comprehensive medication review performed; medication list updated in electronic medical record  BP Readings from Last 3 Encounters:  08/23/20 120/68  08/12/20 106/60  07/28/20 (!) 112/55   Can check once a week - twice a day  Hypertension (BP goal {CHL HP [facility] Pharmacist BP ranges:[phone]}) -{US controlled/uncontrolled:25276} -Current treatment: Diltiazem 120 mg 1 capsule daily  Losartan 100 mg 1/2 tablet daily  Hydrochlorothiazide 25 mg 1 tablet  daily Furosemide 40 mg 1 tablet daily -Medications previously tried: ***  -Current home readings: 150/99 (right after meds and coffee), 122/62 (usually right around 122/62)  -Current dietary habits: conscious of salt intake - wife reads package labels; no salt when eating -Current exercise habits: *** -{ACTIONS;DENIES/REPORTS:21021675::"Denies"} hypotensive/hypertensive symptoms -Educated on {CCM BP Counseling:25124} -Counseled to monitor BP at home ***, document, and provide log at future appointments -{CCMPHARMDINTERVENTION:25122} -furosemide is taking every day -move losartan to evening -wife is former Equities trader Married is 42  -lost 8 lbs this year - wife has lost a lot of weight  Lab Results  Component Value Date   CHOL 165 05/06/2019   HDL 52.40 05/06/2019   LDLCALC 84 05/06/2019   LDLDIRECT 104.9 06/09/2009   TRIG 144.0 05/06/2019   CHOLHDL 3 05/06/2019   Hyperlipidemia: (LDL goal < 70) -Uncontrolled -Current treatment: Simvastatin 20 mg 1 tablet daily -Medications previously tried: ***  -Current dietary patterns: *** - eating a lot of red meat; cut back on meat (doesn't eat meat every day); doesn't fry foods much; occasionally cooks with oil (corn oil) -Current exercise habits: *** - -Educated on {CCM HLD Counseling:25126} -{CCMPHARMDINTERVENTION:25122} -increase statin? -switch to high intensity? -Dr. Curt Bears - drug interaction  CAD (Goal: ***) -{US controlled/uncontrolled:25276} -Current treatment  Simvastatin 20 mg 1 tablet daily Xarelto 20 mg 1 tablet daily with supper -Medications previously tried: ***  -{CCMPHARMDINTERVENTION:25122}  Atrial Fibrillation (Goal: prevent stroke and major bleeding) -{US controlled/uncontrolled:25276} -CHADSVASC: 3 -Current treatment: Rate control: Diltiazem 120 mg 1 capsule daily Anticoagulation: Xarelto 20 mg 1 tablet daily with supper -Medications previously tried: *** -Home BP and HR readings: ***  -Counseled  on {CCMAFIBCOUNSELING:25120} -{CCMPHARMDINTERVENTION:25122} -at night - recommended taking with dinner  Lab Results  Component Value Date   TSH 0.91 01/14/2019   Hypothyroidism (Goal: TSH 0.35-4.5) -{US controlled/uncontrolled:25276} -Current treatment  Levothyroxine 50 mcg 1 tablet daily -Medications previously tried: ***  -{CCMPHARMDINTERVENTION:25122} -morning - takes it with meal replacement drink -just take before meal replacement   BPH (Goal: ***) -{US controlled/uncontrolled:25276} -Current treatment  Tamsulosin 0.4 mg 1 capsule daily -Medications previously tried: ***  -{CCMPHARMDINTERVENTION:25122} -has been helpful - knows it Was getting up 3-4 times a night  Osteoarthritis (Goal: ***) -{US controlled/uncontrolled:25276} -Current treatment  Tylenol 500 mg 1 tablet as needed -Medications previously tried: ***  -{CCMPHARMDINTERVENTION:25122}  Swelling (Goal: ***) -{US controlled/uncontrolled:25276} -Current treatment  Furosemide 40 mg 1 tablet daily Potassium chloride 59mEq 1 tablet daily -Medications previously tried: ***  -{CCMPHARMDINTERVENTION:25122} -dinstinct taste with potassium  Neuropathy (Goal: ***) -{US controlled/uncontrolled:25276} -Current treatment  Gabapentin 600 mg 1 tablet three times daily -Medications previously tried: ***  -{CCMPHARMDINTERVENTION:25122} -tingling more than pain   Health Maintenance -Vaccine gaps: 2nd shingrix, tetanus, COVID booster, influenza -Current therapy:  Hydrocortisone 2.5% rectal cream Benefiber every other day Acetaminophen 500 mg 1 tablet every 6 hours as needed -Educated on {ccm supplement counseling:25128} -{CCM Patient satisfied:25129} -{CCMPHARMDINTERVENTION:25122}   Patient Goals/Self-Care Activities Patient will:  - {pharmacypatientgoals:24919}  Follow Up Plan: {  CM FOLLOW UP PLAN:22241}   Medication Assistance: {MEDASSISTANCEINFO:25044}  Compliance/Adherence/Medication fill  history: Care Gaps: 2nd shingrix?***, tetanus, COVID booster, influenza  Star-Rating Drugs: Losartan (Cozaar) 100 mg - Last filled 10-14-2020 90 DS at [facility] Simvastatin (Zocor) 20 mg - Last filled 10-14-2020 90 DS at [facility]  Patient's preferred pharmacy is:  [id number] - [place], [place] - [address] AT [place] [address] [place] [address] Phone: [phone] Fax: [phone]  [id number] [place] [address] [id number]: [phone]: [phone] Uses pill box? {Yes or If no, why not?:20788} Pt endorses ***% compliance  We discussed: {Pharmacy options:24294} Patient decided to: {US Pharmacy PFMMC:37543} Care Plan and Follow Up Patient Decision:  {FOLLOWUP:24991}  Plan: {CM FOLLOW UP PKGOV:70340} MJeni Salles PharmD, BJefferson County HospitalClinical Pharmacist [place] Wife uses [facility] mychart

## 2020-11-14 ENCOUNTER — Other Ambulatory Visit: Payer: Self-pay | Admitting: Family Medicine

## 2020-11-16 ENCOUNTER — Encounter: Payer: Self-pay | Admitting: Family Medicine

## 2020-11-16 ENCOUNTER — Ambulatory Visit (INDEPENDENT_AMBULATORY_CARE_PROVIDER_SITE_OTHER): Payer: Medicare HMO | Admitting: Family Medicine

## 2020-11-16 ENCOUNTER — Other Ambulatory Visit: Payer: Self-pay

## 2020-11-16 VITALS — BP 108/70 | HR 66 | Temp 98.2°F | Wt 244.0 lb

## 2020-11-16 DIAGNOSIS — M1611 Unilateral primary osteoarthritis, right hip: Secondary | ICD-10-CM | POA: Diagnosis not present

## 2020-11-16 MED ORDER — PREDNISONE 10 MG PO TABS: 10.0000 mg | ORAL_TABLET | Freq: Every day | ORAL | 2 refills | Status: DC

## 2020-11-16 MED ORDER — METHYLPREDNISOLONE ACETATE 40 MG/ML IJ SUSP
40.0000 mg | Freq: Once | INTRAMUSCULAR | Status: AC
  Administered 2020-11-16: 40 mg via INTRAMUSCULAR

## 2020-11-16 MED ORDER — METHYLPREDNISOLONE ACETATE 80 MG/ML IJ SUSP
80.0000 mg | Freq: Once | INTRAMUSCULAR | Status: AC
  Administered 2020-11-16: 80 mg via INTRAMUSCULAR

## 2020-11-16 NOTE — Progress Notes (Signed)
   Subjective:    Patient ID: Erik [name], male    DOB: 12/20/1945, 75 y.o.   MRN: [id number]  HPI Here for several weeks of right hip pain. We saw him for this 2 years ago and Xrays revealed mild to moderate OA in the joint. We treated him with Prednisone for awhile and this calmed down quite a bit. Then he developed more severe pain in the left hip. He wound up having a left hip total arthroplasty per Dr. Braulio Bosch on 09-01-19. He saw Dr. Erlinda Hong for the right hip on 10-22-19 and he attempted a CT guided steroid injection. This was extremely painful for the patient and he decided to never go back. Now he asks to try the Prednisone again.    Review of Systems  Constitutional: Negative.   Respiratory: Negative.    Cardiovascular: Negative.   Musculoskeletal:  Positive for arthralgias.      Objective:   Physical Exam Constitutional:      Appearance: Normal appearance.  Cardiovascular:     Rate and Rhythm: Normal rate and regular rhythm.     Pulses: Normal pulses.     Heart sounds: Normal heart sounds.  Pulmonary:     Effort: Pulmonary effort is normal.     Breath sounds: Normal breath sounds.  Musculoskeletal:     Comments: The right hip is not tender. He has full ROM but rotation causes pain  Neurological:     Mental Status: He is alert.          Assessment & Plan:  Right hip pain. He is given a shot of DepoMedrol today and then he will start taking 10 mg of Prednisone daily. Recheck in one month. [name], MD

## 2020-11-16 NOTE — Addendum Note (Signed)
Addended by: Wyvonne Lenz on: 11/16/2020 04:27 PM   Modules accepted: Orders

## 2020-12-20 ENCOUNTER — Ambulatory Visit (INDEPENDENT_AMBULATORY_CARE_PROVIDER_SITE_OTHER): Payer: Medicare HMO

## 2020-12-20 DIAGNOSIS — I495 Sick sinus syndrome: Secondary | ICD-10-CM

## 2020-12-21 LAB — CUP PACEART REMOTE DEVICE CHECK
Battery Remaining Longevity: 57 mo
Battery Remaining Percentage: 50 %
Battery Voltage: 2.98 V
Brady Statistic AP VP Percent: 1.8 %
Brady Statistic AP VS Percent: 78 %
Brady Statistic AS VP Percent: 1 %
Brady Statistic AS VS Percent: 18 %
Brady Statistic RA Percent Paced: 74 %
Brady Statistic RV Percent Paced: 1.8 %
Date Time Interrogation Session: 20221026034053
Implantable Lead Implant Date: 20170501
Implantable Lead Implant Date: 20170501
Implantable Lead Location: 753859
Implantable Lead Location: 753860
Implantable Pulse Generator Implant Date: 20170501
Lead Channel Impedance Value: 410 Ohm
Lead Channel Impedance Value: 540 Ohm
Lead Channel Pacing Threshold Amplitude: 0.75 V
Lead Channel Pacing Threshold Amplitude: 0.75 V
Lead Channel Pacing Threshold Pulse Width: 0.5 ms
Lead Channel Pacing Threshold Pulse Width: 0.5 ms
Lead Channel Sensing Intrinsic Amplitude: 5 mV
Lead Channel Sensing Intrinsic Amplitude: 7.2 mV
Lead Channel Setting Pacing Amplitude: 1.75 V
Lead Channel Setting Pacing Amplitude: 2.5 V
Lead Channel Setting Pacing Pulse Width: 0.5 ms
Lead Channel Setting Sensing Sensitivity: 2 mV
Pulse Gen Model: 2272
Pulse Gen Serial Number: 7897836

## 2020-12-29 NOTE — Progress Notes (Signed)
Remote pacemaker transmission.   

## 2021-01-01 ENCOUNTER — Other Ambulatory Visit: Payer: Self-pay | Admitting: Cardiovascular Disease

## 2021-01-01 ENCOUNTER — Other Ambulatory Visit: Payer: Self-pay | Admitting: Cardiology

## 2021-01-02 NOTE — Telephone Encounter (Signed)
Prescription refill request for Xarelto received.  Indication:Afib Last office visit:11/21 Weight:110.7 kg Age:75 Scr:1.4 CrCl:73.58  Prescription refilled

## 2021-01-06 ENCOUNTER — Other Ambulatory Visit: Payer: Self-pay | Admitting: Family Medicine

## 2021-01-27 ENCOUNTER — Encounter: Payer: Self-pay | Admitting: Family Medicine

## 2021-01-27 ENCOUNTER — Ambulatory Visit (INDEPENDENT_AMBULATORY_CARE_PROVIDER_SITE_OTHER): Payer: Medicare HMO | Admitting: Family Medicine

## 2021-01-27 VITALS — BP 130/70 | HR 67 | Temp 98.1°F | Wt 249.0 lb

## 2021-01-27 DIAGNOSIS — N138 Other obstructive and reflux uropathy: Secondary | ICD-10-CM | POA: Diagnosis not present

## 2021-01-27 DIAGNOSIS — M1611 Unilateral primary osteoarthritis, right hip: Secondary | ICD-10-CM

## 2021-01-27 DIAGNOSIS — I10 Essential (primary) hypertension: Secondary | ICD-10-CM | POA: Diagnosis not present

## 2021-01-27 DIAGNOSIS — N401 Enlarged prostate with lower urinary tract symptoms: Secondary | ICD-10-CM | POA: Diagnosis not present

## 2021-01-27 MED ORDER — LOSARTAN POTASSIUM 50 MG PO TABS: 50.0000 mg | ORAL_TABLET | Freq: Every day | ORAL | 3 refills | Status: DC

## 2021-01-27 MED ORDER — PREDNISONE 10 MG PO TABS
10.0000 mg | ORAL_TABLET | Freq: Every day | ORAL | 3 refills | Status: DC
Start: 2021-01-27 — End: 2022-02-14

## 2021-01-27 MED ORDER — TAMSULOSIN HCL 0.4 MG PO CAPS: 0.8000 mg | ORAL_CAPSULE | Freq: Every day | ORAL | 3 refills | Status: DC

## 2021-01-27 NOTE — Progress Notes (Signed)
   Subjective:    Patient ID: XADEN KAUFMAN, male    DOB: 06/04/1945, 75 y.o.   MRN: [id number]  HPI Here for several issues. First the Prednisone is helping his hip pain quite a bit, and he wants to stay on 10 mg daily. He asks if we can change to a 90 day supply rather than 30. Also his BP has been well controlled, so he asks for a smaller dose of Losartan so he wil not have to cut the 100 mg pills in half. Third he still has some urinary frequency despite taking 0.4 mg of Tamsulosin daily.    Review of Systems  Constitutional: Negative.   Respiratory: Negative.    Cardiovascular: Negative.   Genitourinary:  Positive for frequency. Negative for difficulty urinating and dysuria.  Musculoskeletal:  Positive for arthralgias.      Objective:   Physical Exam Constitutional:      Appearance: Normal appearance.  Cardiovascular:     Rate and Rhythm: Normal rate and regular rhythm.     Pulses: Normal pulses.     Heart sounds: Normal heart sounds.  Pulmonary:     Effort: Pulmonary effort is normal.     Breath sounds: Normal breath sounds.  Neurological:     Mental Status: He is alert.          Assessment & Plan:  For the hip pain, we wrote for a 90 day supply of Prednisone. For the HTN we will change Losartan to 50 mg daily. For the BPH we will increase the Tamsulosin to 0.8 mg daily.  [name], MD

## 2021-02-11 ENCOUNTER — Other Ambulatory Visit: Payer: Self-pay | Admitting: Family Medicine

## 2021-02-11 ENCOUNTER — Other Ambulatory Visit: Payer: Self-pay | Admitting: Cardiology

## 2021-02-13 ENCOUNTER — Other Ambulatory Visit: Payer: Self-pay | Admitting: Family Medicine

## 2021-03-08 ENCOUNTER — Ambulatory Visit: Payer: Medicare HMO

## 2021-03-13 ENCOUNTER — Other Ambulatory Visit: Payer: Self-pay | Admitting: Cardiology

## 2021-03-20 ENCOUNTER — Ambulatory Visit (INDEPENDENT_AMBULATORY_CARE_PROVIDER_SITE_OTHER): Payer: Medicare HMO

## 2021-03-20 VITALS — Ht 70.0 in | Wt 255.0 lb

## 2021-03-20 DIAGNOSIS — Z0000 Encounter for general adult medical examination without abnormal findings: Secondary | ICD-10-CM | POA: Diagnosis not present

## 2021-03-20 NOTE — Progress Notes (Signed)
I connected with Erik [name] today by telephone and verified that I am speaking with the correct person using two identifiers. Location patient: home Location provider: work Persons participating in the virtual visit: Cem Kosman, Mrs. Ellard Artis LPN.   I discussed the limitations, risks, security and privacy concerns of performing an evaluation and management service by telephone and the availability of in person appointments. I also discussed with the patient that there may be a patient responsible charge related to this service. The patient expressed understanding and verbally consented to this telephonic visit.    Interactive audio and video telecommunications were attempted between this provider and patient, however failed, due to patient having technical difficulties OR patient did not have access to video capability.  We continued and completed visit with audio only.     Vital signs may be patient reported or missing.  Subjective:   Erik [name] is a 76 y.o. male who presents for Medicare Annual/Subsequent preventive examination.  Review of Systems     Cardiac Risk Factors include: advanced age (>67men, >48 women);dyslipidemia;hypertension;male gender;obesity (BMI >30kg/m2)     Objective:    Today's Vitals   03/20/21 1426  Weight: 255 lb (115.7 kg)  Height: 5\' 10"  (1.778 m)   Body mass index is 36.59 kg/m.  Advanced Directives 03/20/2021 03/02/2020 09/01/2019 09/01/2019 09/01/2019 08/20/2019 02/12/2018  Does Patient Have a Medical Advance Directive? Yes Yes Yes - Yes Yes Yes  Type of Advance Directive [place];Living will [place];Living will [place];Living will [place];Living will [place];Living will [place];Living will [place];Living will  Does patient want to make changes to medical advance directive? - - No - Patient  declined - - - No - Patient declined  Copy of [place] in Chart? No - copy requested No - copy requested - - No - copy requested - No - copy requested  Would patient like information on creating a medical advance directive? - - - - - - -    Current Medications (verified) Outpatient Encounter Medications as of 03/20/2021  Medication Sig   acetaminophen (TYLENOL) 500 MG tablet Take 1,000 mg by mouth every 6 (six) hours as needed for moderate pain.    diltiazem (CARDIZEM CD) 120 MG 24 hr capsule TAKE 1 CAPSULE(120 MG) BY MOUTH DAILY   furosemide (LASIX) 40 MG tablet TAKE 1 TABLET(40 MG) BY MOUTH DAILY   gabapentin (NEURONTIN) 600 MG tablet TAKE 1 TABLET(600 MG) BY MOUTH THREE TIMES DAILY   hydrochlorothiazide (HYDRODIURIL) 25 MG tablet TAKE 1 TABLET(25 MG) BY MOUTH DAILY   levothyroxine (SYNTHROID) 50 MCG tablet TAKE 1 TABLET BY MOUTH EVERY DAY   losartan (COZAAR) 50 MG tablet Take 1 tablet (50 mg total) by mouth daily.   potassium chloride (KLOR-CON) 10 MEQ tablet TAKE 1 TABLET(10 MEQ) BY MOUTH DAILY   predniSONE (DELTASONE) 10 MG tablet Take 1 tablet (10 mg total) by mouth daily with breakfast.   simvastatin (ZOCOR) 20 MG tablet TAKE 1 TABLET BY MOUTH EVERY DAY   tamsulosin (FLOMAX) 0.4 MG CAPS capsule Take 2 capsules (0.8 mg total) by mouth daily.   XARELTO 20 MG TABS tablet TAKE 1 TABLET(20 MG) BY MOUTH DAILY WITH SUPPER   No facility-administered encounter medications on file as of 03/20/2021.    Allergies (verified) Patient has no known allergies.   History: Past Medical History:  Diagnosis Date   Anxiety  Arthritis    Atypical chest pain 06/26/2015   BPH with urinary obstruction    Carotid disease, bilateral ([place])    a. 09/2014 Carotid U/S: 1-39% bilat ICA stenosis.   Chronic lower back pain    CKD (chronic kidney disease), stage III ([place])    pt. denies   Diverticulitis 12/2016   Diverticulosis    Dyspnea    at rest and exertion   Gout    "couple  days/year" (05/17/2017)   History of kidney stones    Hx of adenomatous colonic polyps 07/2020   2 diminutive   Hyperlipidemia    Hypertension    Hypertensive heart disease    Hypothyroidism    Internal hemorrhoids    Long QT interval 06/26/2015   Nephrolithiasis    Neuropathy 07/30/2017   Non-obstructive CAD    a. 07/2002 Cath: LM 20, LAD 66m/d, LCX 50-68m, OM1 79m, RCA 35m, EF 60%; b. 05/2014 MV: low risk w/ small sized, mild intensity rev defect in apical/inferior/infsept area, nl EF->Med Rx.   Panic attack    Paroxysmal atrial fibrillation ([place])    a. Dx 04/2014; b. 05/2014 Echo: Ef 60-65%, no rwma, triv MR/TR, nl RV;  c. CHA2DS2VASc = 3-->was on eliquis, switched to xarelto 04/2015 2/2 cost; d. 05/2015 Tikosyn loaded w/ conversion to AF; e. 06/2015 QTc prolongation and bradycardia->tikosyn d/c'd, PPM placed, Amio started; f. 07/01/2015 In Aflutter @ clinic f/u.   Paroxysmal atrial flutter ([place])    a. 06/2015 noted to be in rapid Aflutter in Afib clinic-->amio load continued.   Persistent atrial fibrillation ([place]) 06/21/2015   Presence of permanent cardiac pacemaker    St. Jude   Prolapsed internal hemorrhoids, grade 2-3 10/19/2016   LL Gr 3 and RP/RA Gr 2 LL banded 10/19/2016    RLL pneumonia 11/05/2012   Sleep apnea    "can't tolerate mask" (05/17/2017)   Tachy-brady syndrome ([place])    a. 06/27/2015 s/p SJM DC PPM (ser # @ [id number]).   Past Surgical History:  Procedure Laterality Date   ATRIAL FIBRILLATION ABLATION N/A 05/17/2017   Procedure: ATRIAL FIBRILLATION ABLATION;  Surgeon: Constance Haw, MD;  Location: [facility];  Service: Cardiovascular;  Laterality: N/A;   BACK SURGERY     CARDIAC CATHETERIZATION  ~ 2000   CLOSED REDUCTION HAND FRACTURE Right 1984   COLONOSCOPY  06/27/2015   per Dr. Henrene Pastor, internal hemorrhoids and diverticulae, repeat 10 yrs   CYSTOSCOPY WITH RETROGRADE PYELOGRAM, URETEROSCOPY AND STENT PLACEMENT Bilateral 02/14/2018   Procedure: CYSTOSCOPY  WITH RETROGRADE PYELOGRAM, URETEROSCOPY AND STENT PLACEMENT;  Surgeon: Alexis Frock, MD;  Location: [facility];  Service: Urology;  Laterality: Bilateral;   EP IMPLANTABLE DEVICE N/A 06/27/2015   Procedure: Pacemaker Implant;  Surgeon: Evans Lance, MD;  Location: [facility];  Service: Cardiovascular;  Laterality: N/A;   EYE SURGERY  08/03/2019   Cataract   EYE SURGERY  08/17/2019   Cataact   FRACTURE SURGERY     right hand,finger   HEMORRHOID BANDING     HOLMIUM LASER APPLICATION Bilateral 45/40/9811   Procedure: HOLMIUM LASER APPLICATION;  Surgeon: Alexis Frock, MD;  Location: [facility];  Service: Urology;  Laterality: Bilateral;   INSERT / REPLACE / REMOVE PACEMAKER     LACERATION REPAIR Right 1984   "hand"   POSTERIOR LUMBAR FUSION  1998   2 lumbar discs, Dr. Ellene Route; "ray cages"   SKIN CANCER EXCISION  09/2019   TOTAL HIP ARTHROPLASTY Left 09/01/2019  Procedure: LEFT TOTAL HIP ARTHROPLASTY ANTERIOR APPROACH;  Surgeon: Melrose Nakayama, MD;  Location: [facility];  Service: Orthopedics;  Laterality: Left;   Family History  Problem Relation Age of Onset   Dementia Mother    Stroke Father    Diabetes Paternal Grandmother    Stroke Paternal Aunt        x 2   Cancer Paternal Aunt        type unknown   Colon cancer Neg Hx    Stomach cancer Neg Hx    Social History   Socioeconomic History   Marital status: Married    Spouse name: Not on file   Number of children: 1   Years of education: Not on file   Highest education level: Not on file  Occupational History   Occupation: retired  Tobacco Use   Smoking status: Former    Packs/day: 2.00    Years: 28.00    Pack years: 56.00    Types: Cigarettes    Quit date: 02/27/1995    Years since quitting: 26.0   Smokeless tobacco: Never  Vaping Use   Vaping Use: Never used  Substance and Sexual Activity   Alcohol use: Yes    Alcohol/week: 1.0 standard drink    Types: 1 Cans of beer per week    Comment: couple times a month    Drug use: No   Sexual activity: Not Currently    Partners: Female  Other Topics Concern   Not on file  Social History Narrative   He is married and retired he has 1 child   Social Determinants of Radio broadcast assistant Strain: Low Risk    Difficulty of Paying Living Expenses: Not hard at all  Food Insecurity: No Food Insecurity   Worried About Charity fundraiser in the Last Year: Never true   Arboriculturist in the Last Year: Never true  Transportation Needs: No Transportation Needs   Lack of Transportation (Medical): No   Lack of Transportation (Non-Medical): No  Physical Activity: Inactive   Days of Exercise per Week: 0 days   Minutes of Exercise per Session: 0 min  Stress: No Stress Concern Present   Feeling of Stress : Not at all  Social Connections: Not on file    Tobacco Counseling Counseling given: Not Answered   Clinical Intake:  Pre-visit preparation completed: Yes  Pain : No/denies pain     Nutritional Status: BMI > 30  Obese Nutritional Risks: None Diabetes: No  How often do you need to have someone help you when you read instructions, pamphlets, or other written materials from your doctor or pharmacy?: 1 - Never What is the last grade level you completed in school?: 12th grade  Diabetic? no  Interpreter Needed?: No  Information entered by :: NAllen LPN   Activities of Daily Living In your present state of health, do you have any difficulty performing the following activities: 03/20/2021 01/27/2021  Hearing? N N  Vision? N N  Difficulty concentrating or making decisions? N Y  Walking or climbing stairs? N Y  Dressing or bathing? N N  Doing errands, shopping? N N  Preparing Food and eating ? N -  Using the Toilet? N -  In the past six months, have you accidently leaked urine? N -  Do you have problems with loss of bowel control? N -  Managing your Medications? Y -  Comment wife manages -  Managing your Finances? N -  Housekeeping or  managing your Housekeeping? N -  Some recent data might be hidden    Patient Care Team: [name], MD as PCP - General (Family Medicine) Constance Haw, MD as PCP - Electrophysiology (Cardiology) Lorretta Harp, MD as PCP - Cardiology (Cardiology) Viona Gilmore, [facility] as Pharmacist (Pharmacist)  Indicate any recent Medical Services you may have received from other than [facility] providers in the past year (date may be approximate).     Assessment:   This is a routine wellness examination for [facility].  Hearing/Vision screen Vision Screening - Comments:: Regular eye exams,   Dietary issues and exercise activities discussed: Current Exercise Habits: The patient does not participate in regular exercise at present   Goals Addressed             This Visit's Progress    Patient Stated       03/20/2021, want s to get under 240 pounds       Depression Screen PHQ 2/9 Scores 03/20/2021 01/27/2021 03/02/2020 02/15/2017 08/25/2014  PHQ - 2 Score 0 0 0 0 0  PHQ- 9 Score - 2 - - -    Fall Risk Fall Risk  03/20/2021 01/27/2021 03/02/2020 01/21/2019 01/13/2018  Falls in the past year? 0 0 0 0 0  Comment - - - Emmi Telephone Survey: data to providers prior to load [employer] Survey: data to providers prior to load  Number falls in past yr: - 0 0 - -  Injury with Fall? - 0 0 - -  Risk for fall due to : Impaired balance/gait;Medication side effect - Impaired vision;Impaired balance/gait - -  Follow up Falls evaluation completed;Education provided;Falls prevention discussed - Falls prevention discussed - -    FALL RISK PREVENTION PERTAINING TO THE HOME:  Any stairs in or around the home? Yes  If so, are there any without handrails? No  Home free of loose throw rugs in walkways, pet beds, electrical cords, etc? Yes  Adequate lighting in your home to reduce risk of falls? Yes   ASSISTIVE DEVICES UTILIZED TO PREVENT FALLS:  Life alert? No  Use of a cane, walker or w/c? No   Grab bars in the bathroom? No  Shower chair or bench in shower? Yes  Elevated toilet seat or a handicapped toilet? Yes   TIMED UP AND GO:  Was the test performed? No .      Cognitive Function:     6CIT Screen 03/20/2021 03/02/2020  What Year? 0 points 0 points  What month? 0 points 0 points  What time? 0 points -  Count back from 20 0 points 0 points  Months in reverse 0 points 0 points  Repeat phrase 0 points 0 points  Total Score 0 -    Immunizations Immunization History  Administered Date(s) Administered   Influenza, High Dose Seasonal PF 02/15/2017, 12/10/2017, 12/07/2018   Influenza,inj,Quad PF,6+ Mos 11/16/2015   Influenza-Unspecified 11/23/2019, 12/02/2020   PFIZER(Purple Top)SARS-COV-2 Vaccination 03/23/2019, 04/13/2019, 11/26/2019   Pfizer Covid-19 Vaccine Bivalent Booster 79yrs & up 11/23/2020   Pneumococcal Conjugate-13 03/03/2018   Pneumococcal Polysaccharide-23 05/06/2019   Td 06/14/2008   Zoster Recombinat (Shingrix) 07/11/2017   Zoster, Live 11/19/2011    TDAP status: Due, Education has been provided regarding the importance of this vaccine. Advised may receive this vaccine at local pharmacy or Health Dept. Aware to provide a copy of the vaccination record if obtained from local pharmacy or Health Dept. Verbalized acceptance and understanding.  Flu  Vaccine status: Up to date  Pneumococcal vaccine status: Up to date  Covid-19 vaccine status: Completed vaccines  Qualifies for Shingles Vaccine? Yes   Zostavax completed Yes   Shingrix Completed?: No.    Education has been provided regarding the importance of this vaccine. Patient has been advised to call insurance company to determine out of pocket expense if they have not yet received this vaccine. Advised may also receive vaccine at local pharmacy or Health Dept. Verbalized acceptance and understanding.  Screening Tests Health Maintenance  Topic Date Due   Zoster Vaccines- Shingrix (2 of 2) 09/05/2017    TETANUS/TDAP  06/15/2018   Pneumonia Vaccine 72+ Years old  Completed   INFLUENZA VACCINE  Completed   COVID-19 Vaccine  Completed   Hepatitis C Screening  Completed   HPV VACCINES  Aged Out   COLONOSCOPY (Pts 45-44yrs Insurance coverage will need to be confirmed)  Discontinued    Health Maintenance  Health Maintenance Due  Topic Date Due   Zoster Vaccines- Shingrix (2 of 2) 09/05/2017   TETANUS/TDAP  06/15/2018    Colorectal cancer screening: No longer required.   Lung Cancer Screening: (Low Dose CT Chest recommended if Age 16-80 years, 30 pack-year currently smoking OR have quit w/in 15years.) does not qualify.   Lung Cancer Screening Referral: no  Additional Screening:  Hepatitis C Screening: does qualify; Completed 05/06/2019  Vision Screening: Recommended annual ophthalmology exams for early detection of glaucoma and other disorders of the eye. Is the patient up to date with their annual eye exam?  Yes  Who is the provider or what is the name of the office in which the patient attends annual eye exams? Can't remember name If pt is not established with a provider, would they like to be referred to a provider to establish care? No .   Dental Screening: Recommended annual dental exams for proper oral hygiene  Community Resource Referral / Chronic Care Management: CRR required this visit?  No   CCM required this visit?  No      Plan:     I have personally reviewed and noted the following in the patients chart:   Medical and social history Use of alcohol, tobacco or illicit drugs  Current medications and supplements including opioid prescriptions. Patient is not currently taking opioid prescriptions. Functional ability and status Nutritional status Physical activity Advanced directives List of other physicians Hospitalizations, surgeries, and ER visits in previous 12 months Vitals Screenings to include cognitive, depression, and falls Referrals and  appointments  In addition, I have reviewed and discussed with patient certain preventive protocols, quality metrics, and best practice recommendations. A written personalized care plan for preventive services as well as general preventive health recommendations were provided to patient.     Kellie Simmering, LPN   1/74/0814   Nurse Notes: none

## 2021-03-20 NOTE — Patient Instructions (Signed)
[name] , Thank you for taking time to come for your Medicare Wellness Visit. I appreciate your ongoing commitment to your health goals. Please review the following plan we discussed and let me know if I can assist you in the future.   Screening recommendations/referrals: Colonoscopy: not required Recommended yearly ophthalmology/optometry visit for glaucoma screening and checkup Recommended yearly dental visit for hygiene and checkup  Vaccinations: Influenza vaccine: completed 12/02/2020 Pneumococcal vaccine: completed 05/06/2019 Tdap vaccine: due Shingles vaccine: discussed   Covid-19:  11/23/2020, 11/26/2019, 04/13/2019, 03/23/2019  Advanced directives: Please bring a copy of your POA (Power of Attorney) and/or Living Will to your next appointment.   Conditions/risks identified: none  Next appointment: Follow up in one year for your annual wellness visit.   Preventive Care 74 Years and Older, Male Preventive care refers to lifestyle choices and visits with your health care provider that can promote health and wellness. What does preventive care include? A yearly physical exam. This is also called an annual well check. Dental exams once or twice a year. Routine eye exams. Ask your health care provider how often you should have your eyes checked. Personal lifestyle choices, including: Daily care of your teeth and gums. Regular physical activity. Eating a healthy diet. Avoiding tobacco and drug use. Limiting alcohol use. Practicing safe sex. Taking low doses of aspirin every day. Taking vitamin and mineral supplements as recommended by your health care provider. What happens during an annual well check? The services and screenings done by your health care provider during your annual well check will depend on your age, overall health, lifestyle risk factors, and family history of disease. Counseling  Your health care provider may ask you questions about your: Alcohol use. Tobacco  use. Drug use. Emotional well-being. Home and relationship well-being. Sexual activity. Eating habits. History of falls. Memory and ability to understand (cognition). Work and work Statistician. Screening  You may have the following tests or measurements: Height, weight, and BMI. Blood pressure. Lipid and cholesterol levels. These may be checked every 5 years, or more frequently if you are over 60 years old. Skin check. Lung cancer screening. You may have this screening every year starting at age 72 if you have a 30-pack-year history of smoking and currently smoke or have quit within the past 15 years. Fecal occult blood test (FOBT) of the stool. You may have this test every year starting at age 30. Flexible sigmoidoscopy or colonoscopy. You may have a sigmoidoscopy every 5 years or a colonoscopy every 10 years starting at age 28. Prostate cancer screening. Recommendations will vary depending on your family history and other risks. Hepatitis C blood test. Hepatitis B blood test. Sexually transmitted disease (STD) testing. Diabetes screening. This is done by checking your blood sugar (glucose) after you have not eaten for a while (fasting). You may have this done every 1-3 years. Abdominal aortic aneurysm (AAA) screening. You may need this if you are a current or former smoker. Osteoporosis. You may be screened starting at age 12 if you are at high risk. Talk with your health care provider about your test results, treatment options, and if necessary, the need for more tests. Vaccines  Your health care provider may recommend certain vaccines, such as: Influenza vaccine. This is recommended every year. Tetanus, diphtheria, and acellular pertussis (Tdap, Td) vaccine. You may need a Td booster every 10 years. Zoster vaccine. You may need this after age 9. Pneumococcal 13-valent conjugate (PCV13) vaccine. One dose is recommended after age  65. Pneumococcal polysaccharide (PPSV23) vaccine.  One dose is recommended after age 59. Talk to your health care provider about which screenings and vaccines you need and how often you need them. This information is not intended to replace advice given to you by your health care provider. Make sure you discuss any questions you have with your health care provider. Document Released: 03/11/2015 Document Revised: 11/02/2015 Document Reviewed: 12/14/2014 Elsevier Interactive Patient Education  2017 [place] Prevention in the Home Falls can cause injuries. They can happen to people of all ages. There are many things you can do to make your home safe and to help prevent falls. What can I do on the outside of my home? Regularly fix the edges of walkways and driveways and fix any cracks. Remove anything that might make you trip as you walk through a door, such as a raised step or threshold. Trim any bushes or trees on the path to your home. Use bright outdoor lighting. Clear any walking paths of anything that might make someone trip, such as rocks or tools. Regularly check to see if handrails are loose or broken. Make sure that both sides of any steps have handrails. Any raised decks and porches should have guardrails on the edges. Have any leaves, snow, or ice cleared regularly. Use sand or salt on walking paths during winter. Clean up any spills in your garage right away. This includes oil or grease spills. What can I do in the bathroom? Use night lights. Install grab bars by the toilet and in the tub and shower. Do not use towel bars as grab bars. Use non-skid mats or decals in the tub or shower. If you need to sit down in the shower, use a plastic, non-slip stool. Keep the floor dry. Clean up any water that spills on the floor as soon as it happens. Remove soap buildup in the tub or shower regularly. Attach bath mats securely with double-sided non-slip rug tape. Do not have throw rugs and other things on the floor that can make  you trip. What can I do in the bedroom? Use night lights. Make sure that you have a light by your bed that is easy to reach. Do not use any sheets or blankets that are too big for your bed. They should not hang down onto the floor. Have a firm chair that has side arms. You can use this for support while you get dressed. Do not have throw rugs and other things on the floor that can make you trip. What can I do in the kitchen? Clean up any spills right away. Avoid walking on wet floors. Keep items that you use a lot in easy-to-reach places. If you need to reach something above you, use a strong step stool that has a grab bar. Keep electrical cords out of the way. Do not use floor polish or wax that makes floors slippery. If you must use wax, use non-skid floor wax. Do not have throw rugs and other things on the floor that can make you trip. What can I do with my stairs? Do not leave any items on the stairs. Make sure that there are handrails on both sides of the stairs and use them. Fix handrails that are broken or loose. Make sure that handrails are as long as the stairways. Check any carpeting to make sure that it is firmly attached to the stairs. Fix any carpet that is loose or worn. Avoid having throw rugs at  the top or bottom of the stairs. If you do have throw rugs, attach them to the floor with carpet tape. Make sure that you have a light switch at the top of the stairs and the bottom of the stairs. If you do not have them, ask someone to add them for you. What else can I do to help prevent falls? Wear shoes that: Do not have high heels. Have rubber bottoms. Are comfortable and fit you well. Are closed at the toe. Do not wear sandals. If you use a stepladder: Make sure that it is fully opened. Do not climb a closed stepladder. Make sure that both sides of the stepladder are locked into place. Ask someone to hold it for you, if possible. Clearly mark and make sure that you can  see: Any grab bars or handrails. First and last steps. Where the edge of each step is. Use tools that help you move around (mobility aids) if they are needed. These include: Canes. Walkers. Scooters. Crutches. Turn on the lights when you go into a dark area. Replace any light bulbs as soon as they burn out. Set up your furniture so you have a clear path. Avoid moving your furniture around. If any of your floors are uneven, fix them. If there are any pets around you, be aware of where they are. Review your medicines with your doctor. Some medicines can make you feel dizzy. This can increase your chance of falling. Ask your doctor what other things that you can do to help prevent falls. This information is not intended to replace advice given to you by your health care provider. Make sure you discuss any questions you have with your health care provider. Document Released: 12/09/2008 Document Revised: 07/21/2015 Document Reviewed: 03/19/2014 Elsevier Interactive Patient Education  2017 [employer].

## 2021-03-21 ENCOUNTER — Ambulatory Visit (INDEPENDENT_AMBULATORY_CARE_PROVIDER_SITE_OTHER): Payer: Medicare HMO

## 2021-03-21 DIAGNOSIS — I495 Sick sinus syndrome: Secondary | ICD-10-CM

## 2021-03-21 LAB — CUP PACEART REMOTE DEVICE CHECK
Battery Remaining Longevity: 54 mo
Battery Remaining Percentage: 48 %
Battery Voltage: 2.98 V
Brady Statistic AP VP Percent: 1.6 %
Brady Statistic AP VS Percent: 77 %
Brady Statistic AS VP Percent: 1 %
Brady Statistic AS VS Percent: 19 %
Brady Statistic RA Percent Paced: 74 %
Brady Statistic RV Percent Paced: 1.6 %
Date Time Interrogation Session: 20230124052517
Implantable Lead Implant Date: 20170501
Implantable Lead Implant Date: 20170501
Implantable Lead Location: 753859
Implantable Lead Location: 753860
Implantable Pulse Generator Implant Date: 20170501
Lead Channel Impedance Value: 400 Ohm
Lead Channel Impedance Value: 510 Ohm
Lead Channel Pacing Threshold Amplitude: 0.75 V
Lead Channel Pacing Threshold Amplitude: 0.75 V
Lead Channel Pacing Threshold Pulse Width: 0.5 ms
Lead Channel Pacing Threshold Pulse Width: 0.5 ms
Lead Channel Sensing Intrinsic Amplitude: 4.9 mV
Lead Channel Sensing Intrinsic Amplitude: 6.3 mV
Lead Channel Setting Pacing Amplitude: 1.75 V
Lead Channel Setting Pacing Amplitude: 2.5 V
Lead Channel Setting Pacing Pulse Width: 0.5 ms
Lead Channel Setting Sensing Sensitivity: 2 mV
Pulse Gen Model: 2272
Pulse Gen Serial Number: 7897836

## 2021-03-22 ENCOUNTER — Other Ambulatory Visit: Payer: Self-pay | Admitting: Family Medicine

## 2021-03-31 NOTE — Progress Notes (Signed)
Remote pacemaker transmission.   

## 2021-04-04 ENCOUNTER — Other Ambulatory Visit: Payer: Self-pay | Admitting: Cardiology

## 2021-04-25 ENCOUNTER — Other Ambulatory Visit: Payer: Self-pay | Admitting: Family Medicine

## 2021-05-01 DIAGNOSIS — Z961 Presence of intraocular lens: Secondary | ICD-10-CM | POA: Diagnosis not present

## 2021-05-01 DIAGNOSIS — H5021 Vertical strabismus, right eye: Secondary | ICD-10-CM | POA: Diagnosis not present

## 2021-05-01 DIAGNOSIS — H40013 Open angle with borderline findings, low risk, bilateral: Secondary | ICD-10-CM | POA: Diagnosis not present

## 2021-05-01 DIAGNOSIS — H5203 Hypermetropia, bilateral: Secondary | ICD-10-CM | POA: Diagnosis not present

## 2021-05-04 ENCOUNTER — Other Ambulatory Visit: Payer: Self-pay | Admitting: Family Medicine

## 2021-05-04 ENCOUNTER — Other Ambulatory Visit: Payer: Self-pay | Admitting: Cardiology

## 2021-05-18 ENCOUNTER — Other Ambulatory Visit (INDEPENDENT_AMBULATORY_CARE_PROVIDER_SITE_OTHER): Payer: Medicare HMO

## 2021-05-18 ENCOUNTER — Encounter: Payer: Self-pay | Admitting: Internal Medicine

## 2021-05-18 ENCOUNTER — Ambulatory Visit: Payer: Medicare HMO | Admitting: Internal Medicine

## 2021-05-18 VITALS — BP 108/60 | HR 74 | Ht 69.0 in | Wt 256.0 lb

## 2021-05-18 DIAGNOSIS — K644 Residual hemorrhoidal skin tags: Secondary | ICD-10-CM | POA: Diagnosis not present

## 2021-05-18 DIAGNOSIS — K648 Other hemorrhoids: Secondary | ICD-10-CM

## 2021-05-18 LAB — CBC WITH DIFFERENTIAL/PLATELET
Basophils Absolute: 0 10*3/uL (ref 0.0–0.1)
Basophils Relative: 0.4 % (ref 0.0–3.0)
Eosinophils Absolute: 0 10*3/uL (ref 0.0–0.7)
Eosinophils Relative: 0.4 % (ref 0.0–5.0)
HCT: 41.6 % (ref 39.0–52.0)
Hemoglobin: 14 g/dL (ref 13.0–17.0)
Lymphocytes Relative: 7.1 % — ABNORMAL LOW (ref 12.0–46.0)
Lymphs Abs: 0.6 10*3/uL — ABNORMAL LOW (ref 0.7–4.0)
MCHC: 33.7 g/dL (ref 30.0–36.0)
MCV: 87.8 fl (ref 78.0–100.0)
Monocytes Absolute: 0.3 10*3/uL (ref 0.1–1.0)
Monocytes Relative: 4.3 % (ref 3.0–12.0)
Neutro Abs: 7.1 10*3/uL (ref 1.4–7.7)
Neutrophils Relative %: 87.8 % — ABNORMAL HIGH (ref 43.0–77.0)
Platelets: 203 10*3/uL (ref 150.0–400.0)
RBC: 4.74 Mil/uL (ref 4.22–5.81)
RDW: 13.8 % (ref 11.5–15.5)
WBC: 8.1 10*3/uL (ref 4.0–10.5)

## 2021-05-18 NOTE — Patient Instructions (Signed)
Your provider has requested that you go to the basement level for lab work before leaving today. Press "B" on the elevator. The lab is located at the first door on the left as you exit the elevator. ? ?Due to recent changes in healthcare laws, you may see the results of your imaging and laboratory studies on MyChart before your provider has had a chance to review them.  We understand that in some cases there may be results that are confusing or concerning to you. Not all laboratory results come back in the same time frame and the provider may be waiting for multiple results in order to interpret others.  Please give [place] 48 hours in order for your provider to thoroughly review all the results before contacting the office for clarification of your results.  ? ?We have placed a referral to [facility] for you to see either Dr Nadeen Landau or Dr Leighton Ruff. The phone # is [phone]. ? ?I appreciate the opportunity to care for you. ?Silvano Rusk, MD, [facility] ?

## 2021-05-18 NOTE — Progress Notes (Signed)
? ?Erik [name] 76 y.o. [birth year] [id number] ? ?Assessment & Plan:  ? ?Encounter Diagnosis  ?Name Primary?  ? Internal and external bleeding hemorrhoids Yes  ? ?We will refer to surgery-colorectal surgery-to see what they might be able to offer.  I explained to him that they might not recommend any surgical therapy and he may have to live with this.  Certainly taking Xarelto aggravates things. ? ?CBC was checked ? ?Lab Results  ?Component Value Date  ? WBC 8.1 05/18/2021  ? HGB 14.0 05/18/2021  ? HCT 41.6 05/18/2021  ? MCV 87.8 05/18/2021  ? PLT 203.0 05/18/2021  ? ? ? ? ?Subjective:  ? ?Chief Complaint: ? ?HPI ?76 year old white man on Xarelto for atrial fibrillation with chronic recurrent bleeding hemorrhoids.  He is status post multiple banding's in 2018 2021 and 2022 but has had recurrent problems.Last seen 08/23/20 and at that time I thought that he had more problems with external versus internal and he had anal canal stenosis and since I had failed to resolve his problems with other banding's I did not think I could help him further.  He did okay for a while but he has had recurrent bleeding in his back for discussion.  He is not having pain.  He continues to have intermittent bleeding a week or 2 at a time and then will go a week or 2 without.  He was slightly anemic with a hemoglobin of 12 last year.  No follow-up hemoglobin.   ? ? ?Colonoscopy 07/28/2020 ?- Hemorrhoids found on perianal exam. ?- External hemorrhoids. ?- One 3 mm polyp in the transverse colon, removed with a cold snare. Resected and ?retrieved.-Adenoma ?- One 1 mm polyp in the transverse colon, removed with a cold biopsy forceps. Resected and ?retrieved.-Adenoma ?- Diverticulosis in the sigmoid colon and in the ascending colon. ?- The examination was otherwise normal on direct and retroflexion views. ? ? ? ?No Known Allergies ?Current Meds  ?Medication Sig  ? acetaminophen (TYLENOL) 500 MG tablet Take 1,000 mg by mouth every 6 (six)  hours as needed for moderate pain.   ? diltiazem (CARDIZEM CD) 120 MG 24 hr capsule TAKE 1 CAPSULE(120 MG) BY MOUTH DAILY  ? furosemide (LASIX) 40 MG tablet TAKE 1 TABLET(40 MG) BY MOUTH DAILY  ? gabapentin (NEURONTIN) 600 MG tablet TAKE 1 TABLET(600 MG) BY MOUTH THREE TIMES DAILY  ? hydrochlorothiazide (HYDRODIURIL) 25 MG tablet TAKE 1 TABLET(25 MG) BY MOUTH DAILY  ? levothyroxine (SYNTHROID) 50 MCG tablet TAKE 1 TABLET BY MOUTH EVERY DAY  ? losartan (COZAAR) 50 MG tablet Take 1 tablet (50 mg total) by mouth daily. (Patient taking differently: Take 25 mg by mouth daily.)  ? potassium chloride (KLOR-CON) 10 MEQ tablet Take 1 tablet (10 mEq total) by mouth daily.  ? predniSONE (DELTASONE) 10 MG tablet Take 1 tablet (10 mg total) by mouth daily with breakfast.  ? simvastatin (ZOCOR) 20 MG tablet TAKE 1 TABLET(20 MG) BY MOUTH DAILY AT 6 PM  ? tamsulosin (FLOMAX) 0.4 MG CAPS capsule Take 2 capsules (0.8 mg total) by mouth daily.  ? XARELTO 20 MG TABS tablet TAKE 1 TABLET(20 MG) BY MOUTH DAILY WITH SUPPER  ? ?Past Medical History:  ?Diagnosis Date  ? Anxiety   ? Arthritis   ? Atypical chest pain 06/26/2015  ? BPH with urinary obstruction   ? Carotid disease, bilateral ([place])   ? a. 09/2014 Carotid U/S: 1-39% bilat ICA stenosis.  ? Chronic lower back pain   ?  CKD (chronic kidney disease), stage III ([place])   ? pt. denies  ? Diverticulitis 12/2016  ? Diverticulosis   ? Dyspnea   ? at rest and exertion  ? Gout   ? "couple days/year" (05/17/2017)  ? History of kidney stones   ? Hx of adenomatous colonic polyps 07/2020  ? 2 diminutive  ? Hyperlipidemia   ? Hypertension   ? Hypertensive heart disease   ? Hypothyroidism   ? Internal hemorrhoids   ? Long QT interval 06/26/2015  ? Nephrolithiasis   ? Neuropathy 07/30/2017  ? Non-obstructive CAD   ? a. 07/2002 Cath: LM 20, LAD 89md, LCX 50-653mOM1 4092mCA 77m82m 60%; b. 05/2014 MV: low risk w/ small sized, mild intensity rev defect in apical/inferior/infsept area, nl EF->Med Rx.  ?  Panic attack   ? Paroxysmal atrial fibrillation (HCC)   ? a. Dx 04/2014; b. 05/2014 Echo: Ef 60-65%, no rwma, triv MR/TR, nl RV;  c. CHA2DS2VASc = 3-->was on eliquis, switched to xarelto 04/2015 2/2 cost; d. 05/2015 Tikosyn loaded w/ conversion to AF; e. 06/2015 QTc prolongation and bradycardia->tikosyn d/c'd, PPM placed, Amio started; f. 07/01/2015 In Aflutter @ clinic f/u.  ? Paroxysmal atrial flutter (HCC)[place]? a. 06/2015 noted to be in rapid Aflutter in Afib clinic-->amio load continued.  ? Persistent atrial fibrillation (HCC)[place]/25/2017  ? Presence of permanent cardiac pacemaker   ? St. Jude  ? Prolapsed internal hemorrhoids, grade 2-3 10/19/2016  ? LL Gr 3 and RP/RA Gr 2 LL banded 10/19/2016   ? RLL pneumonia 11/05/2012  ? Sleep apnea   ? "can't tolerate mask" (05/17/2017)  ? 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 a. 06/27/2015 s/p SJM DC PPM (ser # @ [id number]? ?Past Surgical History:  ?Procedure Laterality Date  ? ATRIAL FIBRILLATION ABLATION N/A 05/17/2017  ? Procedure: ATRIAL FIBRILLATION ABLATION;  Surgeon: CamnConstance Haw;  [facility];  Service: Cardiovascular;  Laterality: N/A;  ? BACK SURGERY    ? CARDIAC CATHETERIZATION  ~ 2000  ? CLOSED REDUCTION HAND FRACTURE Right 1984  ? COLONOSCOPY  06/27/2015  ? per Dr. PerrHenrene Pastorternal hemorrhoids and diverticulae, repeat 10 yrs  ? CYSTOSCOPY WITH RETROGRADE PYELOGRAM, URETEROSCOPY AND STENT PLACEMENT Bilateral 02/14/2018  ? Procedure: CYSTOSCOPY WITH RETROGRADE PYELOGRAM, URETEROSCOPY AND STENT PLACEMENT;  Surgeon: MannAlexis Frock;  Location: [facility];  Service: Urology;  Laterality: Bilateral;  ? EP IMPLANTABLE DEVICE N/A 06/27/2015  ? Procedure: Pacemaker Implant;  Surgeon: GregEvans Lance;  [facility];  Service: Cardiovascular;  Laterality: N/A;  ? EYE SURGERY  08/03/2019  ? Cataract  ? EYE SURGERY  08/17/2019  ? Cataact  ? FRACTURE SURGERY    ? right hand,finger  ? HEMORRHOID BANDING    ? HOLMIUM LASER APPLICATION Bilateral  12/245/40/9811Procedure: HOLMIUM LASER APPLICATION;  Surgeon: MannAlexis Frock;  Location: [facility];  Service: Urology;  Laterality: Bilateral;  ? INSERT / REPLACE / REMOVE PACEMAKER    ? LACERATION REPAIR Right 1984  ? "hand"  ? POSTERIOR LUMBAR FUSION  1998  ? 2 lumbar discs, Dr. ElsnEllene Routeay cages"  ? SKIN CANCER EXCISION  09/2019  ? TOTAL HIP ARTHROPLASTY Left 09/01/2019  ? Procedure: LEFT TOTAL HIP ARTHROPLASTY ANTERIOR APPROACH;  Surgeon: DallMelrose Nakayama;  Location: [facility];  Service: Orthopedics;  Laterality: Left;  ? ?Social History  ? ?Social History Narrative  ? He is married and retired he has 1  child  ? ?family history includes Cancer in his paternal aunt; Dementia in his mother; Diabetes in his paternal grandmother; Stroke in his father and paternal aunt. ? ? ?Review of Systems ?As above ? ?Objective:  ? Physical Exam ?BP 108/60   Pulse 74   Ht '5\' 9"'$  (1.753 m)   Wt 256 lb (116.1 kg)   SpO2 94%   BMI 37.80 kg/m?  ? ? ?

## 2021-05-29 DIAGNOSIS — K648 Other hemorrhoids: Secondary | ICD-10-CM | POA: Diagnosis not present

## 2021-05-29 DIAGNOSIS — I482 Chronic atrial fibrillation, unspecified: Secondary | ICD-10-CM | POA: Diagnosis not present

## 2021-05-29 DIAGNOSIS — Z7901 Long term (current) use of anticoagulants: Secondary | ICD-10-CM | POA: Diagnosis not present

## 2021-06-13 ENCOUNTER — Telehealth: Payer: Self-pay | Admitting: Pharmacist

## 2021-06-13 NOTE — Chronic Care Management (AMB) (Signed)
? ? ?Chronic Care Management ?Pharmacy Assistant  ? ?Name: STEAVEN WHOLEY  MRN: [id number] DOB: 02/25/1946 ? ?Reason for Encounter: Disease State General Assessment ?  ?Hospital visits:  ?None in previous 6 months ? ?Medications: ?Outpatient Encounter Medications as of 06/13/2021  ?Medication Sig  ? acetaminophen (TYLENOL) 500 MG tablet Take 1,000 mg by mouth every 6 (six) hours as needed for moderate pain.   ? diltiazem (CARDIZEM CD) 120 MG 24 hr capsule TAKE 1 CAPSULE(120 MG) BY MOUTH DAILY  ? furosemide (LASIX) 40 MG tablet TAKE 1 TABLET(40 MG) BY MOUTH DAILY  ? gabapentin (NEURONTIN) 600 MG tablet TAKE 1 TABLET(600 MG) BY MOUTH THREE TIMES DAILY  ? hydrochlorothiazide (HYDRODIURIL) 25 MG tablet TAKE 1 TABLET(25 MG) BY MOUTH DAILY  ? levothyroxine (SYNTHROID) 50 MCG tablet TAKE 1 TABLET BY MOUTH EVERY DAY  ? losartan (COZAAR) 50 MG tablet Take 1 tablet (50 mg total) by mouth daily. (Patient taking differently: Take 25 mg by mouth daily.)  ? potassium chloride (KLOR-CON) 10 MEQ tablet Take 1 tablet (10 mEq total) by mouth daily.  ? predniSONE (DELTASONE) 10 MG tablet Take 1 tablet (10 mg total) by mouth daily with breakfast.  ? simvastatin (ZOCOR) 20 MG tablet TAKE 1 TABLET(20 MG) BY MOUTH DAILY AT 6 PM  ? tamsulosin (FLOMAX) 0.4 MG CAPS capsule Take 2 capsules (0.8 mg total) by mouth daily.  ? XARELTO 20 MG TABS tablet TAKE 1 TABLET(20 MG) BY MOUTH DAILY WITH SUPPER  ? ?No facility-administered encounter medications on file as of 06/13/2021.  ?Reviewed chart prior to disease state call. Spoke with patient regarding BP ? ?Recent Office Vitals: ?BP Readings from Last 3 Encounters:  ?05/18/21 108/60  ?01/27/21 130/70  ?11/16/20 108/70  ? ?Pulse Readings from Last 3 Encounters:  ?05/18/21 74  ?01/27/21 67  ?11/16/20 66  ?  ?Wt Readings from Last 3 Encounters:  ?05/18/21 256 lb (116.1 kg)  ?03/20/21 255 lb (115.7 kg)  ?01/27/21 249 lb (112.9 kg)  ?  ? ?Kidney Function ?Lab Results  ?Component Value Date/Time  ?  CREATININE 1.41 (H) 09/01/2020 02:32 PM  ? CREATININE 1.32 (H) 08/21/2019 02:20 PM  ? CREATININE 1.41 (H) 05/18/2015 08:18 AM  ? CREATININE 1.20 05/19/2014 01:58 PM  ? GFR 50.25 (L) 01/14/2019 03:46 PM  ? GFRNONAA 54 (L) 08/21/2019 02:20 PM  ? GFRAA >60 08/21/2019 02:20 PM  ? ? ? ?  Latest Ref Rng & Units 09/01/2020  ?  2:32 PM 08/21/2019  ?  2:20 PM 08/10/2019  ?  3:08 PM  ?BMP  ?Glucose 65 - 99 mg/dL 98   99   95    ?BUN 8 - 27 mg/dL '14   17   15    '$ ?Creatinine 0.76 - 1.27 mg/dL 1.41   1.32   1.30    ?BUN/Creat Ratio 10 - '24 10    12    '$ ?Sodium 134 - 144 mmol/L 144   143   145    ?Potassium 3.5 - 5.2 mmol/L 4.0   3.9   4.1    ?Chloride 96 - 106 mmol/L 107   105   107    ?CO2 20 - 29 mmol/L '22   28   22    '$ ?Calcium 8.6 - 10.2 mg/dL 8.9   9.8   9.7    ? ?[place] for General Review Call ? ? ?Chart Review: ? ?Have there been any documented new, changed, or discontinued medications since last visit?  Yes  ?Changed Losartan.Take 1 tablet (50 mg total) by mouth daily Changed TamsulosinTake 2 capsules (0.8 mg total) by mouth daily ?Has there been any documented recent hospitalizations or ED visits since last visit with Clinical Pharmacist? No ? ? ? ?4. Brief Summary  ?Recent office visits:  ?03/20/21 Kellie Simmering, LPN - Patient presented for Medicare Annual Wellness Exam. No medication changes. ? ?01/27/21 [name], MD - Patient presented for Primary osteoarthritis of right hip and other concerns. Changed Losartan. Changed Tamsulosin ? ?Recent consult visits:  ?05/29/21 Tami Lin (Gen Surg) - Patient presented for hemorrhoids. No other visit details available. ? ?05/18/21 Gatha Mayer, MD [facility]) - Patient presented for Internal and external bleeding hemorrhoids. No medication changes. ? ? ?Adherence Review: ? ?Does the Clinical Pharmacist Assistant have access to adherence rates? Yes ?Adherence rates for STAR metric medications. ?Adherence rates for medications indicated for disease  state being reviewed. ?Losartan (Cozaar) 100 mg - Last filled 01/18/21 90 DS at [facility] ?Verified ?Simvastatin (Zocor) 20 mg - Last filled 05/24/21 90 DS at [facility] ? ?Does the patient have >5 day gap between last estimated fill dates for any of the above medications or other medication gaps? Yes ?Reason for medication gaps. ?       Unable to reach ? ? ?Disease State Questions: ? ?Able to connect with Patient? No ? ?14. Next visit Type:       Unable to reach need in July ?  ? ? ? ? ?Care Gaps: ?BP- 108/60 ( 05/18/21) ?AWV- 1/23 ?Zoster Vaccine - Overdue ?TDAP - Overdue ?CCM- Unable to reach need in July ? ? ? ? ? ? ?Ned Clines CMA ?Clinical Pharmacist Assistant ?[phone] ? ?

## 2021-06-20 ENCOUNTER — Telehealth: Payer: Self-pay | Admitting: Family Medicine

## 2021-06-20 ENCOUNTER — Ambulatory Visit (INDEPENDENT_AMBULATORY_CARE_PROVIDER_SITE_OTHER): Payer: Medicare HMO

## 2021-06-20 DIAGNOSIS — I495 Sick sinus syndrome: Secondary | ICD-10-CM | POA: Diagnosis not present

## 2021-06-20 NOTE — Telephone Encounter (Signed)
Just a heads up that patient is c/o itchy red spots on both arms, got bck from [place] saturday started saturday morning before he got home. mentioned it may be bedbug bites from the place he stayed friday night. Scheduled for 06/21/21 at 9:45. wife has pics she could send if needed. Offered virtual appointment, patient declined ?

## 2021-06-21 ENCOUNTER — Encounter: Payer: Self-pay | Admitting: Family Medicine

## 2021-06-21 ENCOUNTER — Ambulatory Visit (INDEPENDENT_AMBULATORY_CARE_PROVIDER_SITE_OTHER): Payer: Medicare HMO | Admitting: Family Medicine

## 2021-06-21 VITALS — BP 118/70 | HR 53 | Temp 98.1°F | Wt 264.0 lb

## 2021-06-21 DIAGNOSIS — L568 Other specified acute skin changes due to ultraviolet radiation: Secondary | ICD-10-CM | POA: Diagnosis not present

## 2021-06-21 LAB — CUP PACEART REMOTE DEVICE CHECK
Battery Remaining Longevity: 52 mo
Battery Remaining Percentage: 46 %
Battery Voltage: 2.98 V
Brady Statistic AP VP Percent: 1.4 %
Brady Statistic AP VS Percent: 75 %
Brady Statistic AS VP Percent: 1 %
Brady Statistic AS VS Percent: 22 %
Brady Statistic RA Percent Paced: 72 %
Brady Statistic RV Percent Paced: 1.4 %
Date Time Interrogation Session: 20230426010520
Implantable Lead Implant Date: 20170501
Implantable Lead Implant Date: 20170501
Implantable Lead Location: 753859
Implantable Lead Location: 753860
Implantable Pulse Generator Implant Date: 20170501
Lead Channel Impedance Value: 400 Ohm
Lead Channel Impedance Value: 540 Ohm
Lead Channel Pacing Threshold Amplitude: 0.75 V
Lead Channel Pacing Threshold Amplitude: 0.875 V
Lead Channel Pacing Threshold Pulse Width: 0.5 ms
Lead Channel Pacing Threshold Pulse Width: 0.5 ms
Lead Channel Sensing Intrinsic Amplitude: 4.7 mV
Lead Channel Sensing Intrinsic Amplitude: 6.8 mV
Lead Channel Setting Pacing Amplitude: 1.875
Lead Channel Setting Pacing Amplitude: 2.5 V
Lead Channel Setting Pacing Pulse Width: 0.5 ms
Lead Channel Setting Sensing Sensitivity: 2 mV
Pulse Gen Model: 2272
Pulse Gen Serial Number: 7897836

## 2021-06-21 MED ORDER — TRIAMCINOLONE ACETONIDE 0.1 % EX CREA: 1.0000 "application " | TOPICAL_CREAM | Freq: Three times a day (TID) | CUTANEOUS | 2 refills | Status: DC

## 2021-06-21 NOTE — Progress Notes (Signed)
? ?  Subjective:  ? ? Patient ID: Erik [name], male    DOB: September 03, 1945, 76 y.o.   MRN: [id number] ? ?HPI ?Here for 5 days of an itchy rash on both forearms. This started the day after he and his wife returned from a vacation in [place]. At first he thought these may be bed bug bites, but his wife never had any problems. He has been applying a hemorrhoid cream he had at home, and the rash appears to be clearing a bit. He notes that the day before he left [place] he and his wife took a harbor cruise on a [place]. That day he was wearing a hat and long pants, but his shirt had short sleeves.  ? ? ?Review of Systems  ?Constitutional: Negative.   ?Respiratory: Negative.    ?Cardiovascular: Negative.   ?Skin:  Positive for rash.  ? ?   ?Objective:  ? Physical Exam ?Constitutional:   ?   General: He is not in acute distress. ?   Appearance: Normal appearance.  ?Cardiovascular:  ?   Rate and Rhythm: Normal rate and regular rhythm.  ?   Pulses: Normal pulses.  ?   Heart sounds: Normal heart sounds.  ?Pulmonary:  ?   Effort: Pulmonary effort is normal.  ?   Breath sounds: Normal breath sounds.  ?Skin: ?   Comments: The skin on both forearms has an urticarial red rash that is slightly raised   ?Neurological:  ?   Mental Status: He is alert.  ? ? ? ? ? ?   ?Assessment & Plan:  ?This is a photosensitivity rash. He will treat it with Triamcinolone cream TID. Recheck as needed.  ?[name], MD ? ? ?

## 2021-06-25 ENCOUNTER — Other Ambulatory Visit: Payer: Self-pay | Admitting: Family Medicine

## 2021-07-03 ENCOUNTER — Other Ambulatory Visit: Payer: Self-pay | Admitting: Cardiology

## 2021-07-05 ENCOUNTER — Other Ambulatory Visit: Payer: Self-pay | Admitting: Cardiovascular Disease

## 2021-07-05 NOTE — Telephone Encounter (Signed)
Prescription refill request for Xarelto received.  ?Indication:Afib ?Last office visit:upcoming ?Weight:119.7 kg ?Age:77 ?Scr:1.4 ?CrCl:78.38 ml/min ? ?Prescription refilled ? ?

## 2021-07-06 DIAGNOSIS — Z961 Presence of intraocular lens: Secondary | ICD-10-CM | POA: Diagnosis not present

## 2021-07-06 DIAGNOSIS — H1132 Conjunctival hemorrhage, left eye: Secondary | ICD-10-CM | POA: Diagnosis not present

## 2021-07-06 DIAGNOSIS — H40013 Open angle with borderline findings, low risk, bilateral: Secondary | ICD-10-CM | POA: Diagnosis not present

## 2021-07-06 NOTE — Progress Notes (Signed)
Remote pacemaker transmission.   

## 2021-08-02 ENCOUNTER — Other Ambulatory Visit: Payer: Self-pay | Admitting: Family Medicine

## 2021-08-02 ENCOUNTER — Other Ambulatory Visit: Payer: Self-pay | Admitting: Cardiology

## 2021-08-14 ENCOUNTER — Ambulatory Visit (INDEPENDENT_AMBULATORY_CARE_PROVIDER_SITE_OTHER): Payer: Medicare HMO | Admitting: Cardiology

## 2021-08-14 ENCOUNTER — Encounter: Payer: Self-pay | Admitting: Cardiology

## 2021-08-14 VITALS — BP 116/60 | HR 62 | Ht 69.0 in | Wt 255.8 lb

## 2021-08-14 DIAGNOSIS — I495 Sick sinus syndrome: Secondary | ICD-10-CM

## 2021-08-14 DIAGNOSIS — I4819 Other persistent atrial fibrillation: Secondary | ICD-10-CM

## 2021-08-14 DIAGNOSIS — D6869 Other thrombophilia: Secondary | ICD-10-CM

## 2021-08-14 NOTE — Progress Notes (Signed)
Electrophysiology Office Note   Date:  08/14/2021   ID:  Erik [name], DOB [birth year], MRN [id number]  PCP:  [name], MD  Cardiologist:  [name] Primary Electrophysiologist:  Constance Haw, MD    No chief complaint on file.    History of Present Illness: Erik [name] is a 76 y.o. male who presents today for electrophysiology evaluation.     He has a history significant for atrial fibrillation, nonobstructive coronary artery disease, hypertension, hyperlipidemia, hypothyroidism.  March 2016 he was diagnosed with rapid atrial fibrillation in the setting of chest pain.  He was loaded on dofetilide but this was stopped and he went back into atrial fibrillation.  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  He had an atrial fibrillation ablation 05/17/2017.  Today, denies symptoms of palpitations, chest pain, shortness of breath, orthopnea, PND, lower extremity edema, claudication, dizziness, presyncope, syncope, bleeding, or neurologic sequela. The patient is tolerating medications without difficulties.  Since being seen he has done well.  He is unaware of episodes of atrial fibrillation.  He is overall quite happy with his control.  He has no chest pain or shortness of breath.   Past Medical History:  Diagnosis Date   Anxiety    Arthritis    Atypical chest pain 06/26/2015   BPH with urinary obstruction    Carotid disease, bilateral ([place])    a. 09/2014 Carotid U/S: 1-39% bilat ICA stenosis.   Chronic lower back pain    CKD (chronic kidney disease), stage III ([place])    pt. denies   Diverticulitis 12/2016   Diverticulosis    Dyspnea    at rest and exertion   Gout    "couple days/year" (05/17/2017)   History of kidney stones    Hx of adenomatous colonic polyps 07/2020   2 diminutive   Hyperlipidemia    Hypertension    Hypertensive heart disease    Hypothyroidism    Internal hemorrhoids    Long QT interval  06/26/2015   Nephrolithiasis    Neuropathy 07/30/2017   Non-obstructive CAD    a. 07/2002 Cath: LM 20, LAD 82md, LCX 50-622mOM1 4063mCA 51m42m 60%; b. 05/2014 MV: low risk w/ small sized, mild intensity rev defect in apical/inferior/infsept area, nl EF->Med Rx.   Panic attack    Paroxysmal atrial fibrillation (HCC)[place] a. Dx 04/2014; b. 05/2014 Echo: Ef 60-65%, no rwma, triv MR/TR, nl RV;  c. CHA2DS2VASc = 3-->was on eliquis, switched to xarelto 04/2015 2/2 cost; d. 05/2015 Tikosyn loaded w/ conversion to AF; e. 06/2015 QTc prolongation and bradycardia->tikosyn d/c'd, PPM placed, Amio started; f. 07/01/2015 In Aflutter @ clinic f/u.   Paroxysmal atrial flutter (HCC)[place] a. 06/2015 noted to be in rapid Aflutter in Afib clinic-->amio load continued.   Persistent atrial fibrillation (HCC)[place]/25/2017   Presence of permanent cardiac pacemaker    St. Jude   Prolapsed internal hemorrhoids, grade 2-3 10/19/2016   LL Gr 3 and RP/RA Gr 2 LL banded 10/19/2016    RLL pneumonia 11/05/2012   Sleep apnea    "can't tolerate mask" (05/17/2017)   Tachy-brady syndrome (HCC)[place] a. 06/27/2015 s/p SJM DC PPM (ser # @ [id number] Past Surgical History:  Procedure Laterality Date   ATRIAL FIBRILLATION ABLATION N/A 05/17/2017   Procedure: ATRIAL FIBRILLATION ABLATION;  Surgeon: CamnConstance Haw;  [facility];  Service: Cardiovascular;  Laterality: N/A;   BACK SURGERY     CARDIAC CATHETERIZATION  ~ 2000   CLOSED REDUCTION HAND FRACTURE Right 1984   COLONOSCOPY  06/27/2015   per Dr. Henrene Pastor, internal hemorrhoids and diverticulae, repeat 10 yrs   CYSTOSCOPY WITH RETROGRADE PYELOGRAM, URETEROSCOPY AND STENT PLACEMENT Bilateral 02/14/2018   Procedure: CYSTOSCOPY WITH RETROGRADE PYELOGRAM, URETEROSCOPY AND STENT PLACEMENT;  Surgeon: Alexis Frock, MD;  Location: [facility];  Service: Urology;  Laterality: Bilateral;   EP IMPLANTABLE DEVICE N/A 06/27/2015   Procedure: Pacemaker Implant;  Surgeon: Evans Lance, MD;  Location: [place] [facility];  Service: Cardiovascular;  Laterality: N/A;   EYE SURGERY  08/03/2019   Cataract   EYE SURGERY  08/17/2019   Cataact   FRACTURE SURGERY     right hand,finger   HEMORRHOID BANDING     HOLMIUM LASER APPLICATION Bilateral 44/04/4740   Procedure: HOLMIUM LASER APPLICATION;  Surgeon: Alexis Frock, MD;  Location: [facility];  Service: Urology;  Laterality: Bilateral;   INSERT / REPLACE / REMOVE PACEMAKER     LACERATION REPAIR Right 1984   "hand"   POSTERIOR LUMBAR FUSION  1998   2 lumbar discs, Dr. Ellene Route; "ray cages"   SKIN CANCER EXCISION  09/2019   TOTAL HIP ARTHROPLASTY Left 09/01/2019   Procedure: LEFT TOTAL HIP ARTHROPLASTY ANTERIOR APPROACH;  Surgeon: Melrose Nakayama, MD;  Location: [facility];  Service: Orthopedics;  Laterality: Left;     Current Outpatient Medications  Medication Sig Dispense Refill   acetaminophen (TYLENOL) 500 MG tablet Take 1,000 mg by mouth every 6 (six) hours as needed for moderate pain.      diltiazem (CARDIZEM CD) 120 MG 24 hr capsule TAKE 1 CAPSULE(120 MG) BY MOUTH DAILY 30 capsule 2   furosemide (LASIX) 40 MG tablet TAKE 1 TABLET(40 MG) BY MOUTH DAILY 90 tablet 3   gabapentin (NEURONTIN) 600 MG tablet TAKE 1 TABLET(600 MG) BY MOUTH THREE TIMES DAILY 270 tablet 0   hydrochlorothiazide (HYDRODIURIL) 25 MG tablet TAKE 1 TABLET(25 MG) BY MOUTH DAILY 90 tablet 3   levothyroxine (SYNTHROID) 50 MCG tablet TAKE 1 TABLET BY MOUTH EVERY DAY 90 tablet 3   losartan (COZAAR) 50 MG tablet Take 1 tablet (50 mg total) by mouth daily. (Patient taking differently: Take 25 mg by mouth daily.) 90 tablet 3   potassium chloride (KLOR-CON) 10 MEQ tablet TAKE 1 TABLET(10 MEQ) BY MOUTH DAILY 30 tablet 0   predniSONE (DELTASONE) 10 MG tablet Take 1 tablet (10 mg total) by mouth daily with breakfast. 90 tablet 3   simvastatin (ZOCOR) 20 MG tablet TAKE 1 TABLET(20 MG) BY MOUTH DAILY AT 6 PM 30 tablet 0   tamsulosin (FLOMAX) 0.4 MG CAPS capsule  Take 2 capsules (0.8 mg total) by mouth daily. 90 capsule 3   triamcinolone cream (KENALOG) 0.1 % Apply 1 application. topically 3 (three) times daily. 45 g 2   XARELTO 20 MG TABS tablet TAKE 1 TABLET(20 MG) BY MOUTH DAILY WITH SUPPER 90 tablet 1   No current facility-administered medications for this visit.    Allergies:   Patient has no known allergies.   Social History:  The patient  reports that he quit smoking about 26 years ago. His smoking use included cigarettes. He has a 56.00 pack-year smoking history. He has never used smokeless tobacco. He reports current alcohol use of about 1.0 standard drink of alcohol per week. He reports that he does not use drugs.   Family History:  The patient's family  history includes Cancer in his paternal aunt; Dementia in his mother; Diabetes in his paternal grandmother; Stroke in his father and paternal aunt.   ROS:  Please see the history of present illness.   Otherwise, review of systems is positive for none.   All other systems are reviewed and negative.   PHYSICAL EXAM: VS:  BP 116/60   Pulse 62   Ht '5\' 9"'$  (1.753 m)   Wt 255 lb 12.8 oz (116 kg)   SpO2 97%   BMI 37.78 kg/m  , BMI Body mass index is 37.78 kg/m. GEN: Well nourished, well developed, in no acute distress  HEENT: normal  Neck: no JVD, carotid bruits, or masses Cardiac: RRR; no murmurs, rubs, or gallops,no edema  Respiratory:  clear to auscultation bilaterally, normal work of breathing GI: soft, nontender, nondistended, + BS MS: no deformity or atrophy  Skin: warm and dry, device site well healed Neuro:  Strength and sensation are intact Psych: euthymic mood, full affect  EKG:  EKG is ordered today. Personal review of the ekg ordered shows sinus rhythm  Personal review of the device interrogation today. Results in [facility]: 09/01/2020: ALT 13; BUN 14; Creatinine, Ser 1.41; Potassium 4.0; Sodium 144 05/18/2021: Hemoglobin 14.0; Platelets 203.0    Lipid Panel      Component Value Date/Time   CHOL 165 05/06/2019 1519   TRIG 144.0 05/06/2019 1519   TRIG 203 (HH) 12/10/2005 0839   HDL 52.40 05/06/2019 1519   CHOLHDL 3 05/06/2019 1519   VLDL 28.8 05/06/2019 1519   LDLCALC 84 05/06/2019 1519   LDLDIRECT 104.9 06/09/2009 0919     Wt Readings from Last 3 Encounters:  08/14/21 255 lb 12.8 oz (116 kg)  06/21/21 264 lb (119.7 kg)  05/18/21 256 lb (116.1 kg)      Other studies Reviewed: Additional studies/ records that were reviewed today include: TTE 11/10/15 Review of the above records today demonstrates:  - Left ventricle: The cavity size was normal. Wall thickness was   increased in a pattern of mild LVH. The estimated ejection   fraction was 55%. Wall motion was normal; there were no regional   wall motion abnormalities. Doppler parameters are consistent with   abnormal left ventricular relaxation (grade 1 diastolic   dysfunction). - Aortic valve: There was no stenosis. - Mitral valve: There was trivial regurgitation. - Left atrium: The atrium was mildly dilated. - Right ventricle: The cavity size was normal. Pacer wire or   catheter noted in right ventricle. Systolic function was normal. - Pulmonary arteries: No complete TR doppler jet so unable to   estimate PA systolic pressure. - Inferior vena cava: The vessel was normal in size. The   respirophasic diameter changes were in the normal range (= 50%),   consistent with normal central venous pressure. - Pericardium, extracardiac: A trivial pericardial effusion was   identified posterior to the heart.  Myoview 06/04/14 Low risk stress nuclear study with a small sized, mild intensity reversible defect concering for mild apical-inferior/inferoseptal ischeima.  Clinical Correlation is recommended.  ASSESSMENT AND PLAN:  1.  Persistent atrial fibrillation: Status post ablation 04/27/2017.  Currently on Xarelto and diltiazem.  CHA2DS2-VASc of 2.  Has had minimal episodes of atrial  fibrillation on device interrogation.  We Diallo Ponder continue with current management.  2.  Hypertension: Currently well controlled  3.  Sinus node dysfunction: Status post Saint Jude dual-chamber pacemaker.  Device function appropriately.  No changes at this time.  4.  Secondary hypercoagulable state: Currently on Xarelto for atrial fibrillation as above.  Current medicines are reviewed at length with the patient today.   The patient does not have concerns regarding his medicines.  The following changes were made today: none  Labs/ tests ordered today include:  Orders Placed This Encounter  Procedures   EKG 12-Lead     Disposition:   FU with Erik [name] 12 months  Signed, Erik Finken [name], MD  08/14/2021 3:08 PM     [address] [place] [address] [phone] (office) [phone]

## 2021-08-21 ENCOUNTER — Telehealth: Payer: Self-pay | Admitting: Family Medicine

## 2021-08-21 MED ORDER — TAMSULOSIN HCL 0.4 MG PO CAPS: 0.8000 mg | ORAL_CAPSULE | Freq: Every day | ORAL | 1 refills | Status: DC

## 2021-08-30 ENCOUNTER — Telehealth: Payer: Self-pay | Admitting: Family Medicine

## 2021-08-30 NOTE — Telephone Encounter (Signed)
FYI  for upcoming appointment on 08/31/21.  Spoke with patient's wife Erik [name], she stated that patient's right foot is swollen.   Toes are not red or discolored.   At great toe below joint, it's very red at that area.    Appointment scheduled for 08/31/21 for evaluation.

## 2021-08-30 NOTE — Telephone Encounter (Signed)
Pt requesting medication for gout of his right big toe.

## 2021-08-31 ENCOUNTER — Encounter: Payer: Self-pay | Admitting: Family Medicine

## 2021-08-31 ENCOUNTER — Ambulatory Visit (INDEPENDENT_AMBULATORY_CARE_PROVIDER_SITE_OTHER): Payer: Medicare HMO | Admitting: Family Medicine

## 2021-08-31 VITALS — BP 110/60 | HR 60 | Temp 98.5°F | Wt 257.0 lb

## 2021-08-31 DIAGNOSIS — M109 Gout, unspecified: Secondary | ICD-10-CM

## 2021-08-31 MED ORDER — METHYLPREDNISOLONE ACETATE 80 MG/ML IJ SUSP
80.0000 mg | Freq: Once | INTRAMUSCULAR | Status: AC
  Administered 2021-08-31: 80 mg via INTRAMUSCULAR

## 2021-08-31 NOTE — Addendum Note (Signed)
Addended by: Wyvonne Lenz on: 08/31/2021 11:19 AM   Modules accepted: Orders

## 2021-08-31 NOTE — Progress Notes (Signed)
   Subjective:    Patient ID: Erik [name], male    DOB: [birth year], 76 y.o.   MRN: [id number]  HPI Here for the sudden onset of swelling and severe pain in the right great toe yesterday morning. He normally has a gout flare about twice a year, but this is the worst he can remember. No recent trauma. We reviewed his diet for the past week, and there was no particular agent to cause this that we could find. He has tried taking Tylenol and Tramadol with no relief.    Review of Systems  Constitutional: Negative.   Respiratory: Negative.    Cardiovascular: Negative.   Musculoskeletal:  Positive for arthralgias and joint swelling.       Objective:   Physical Exam Constitutional:      Comments: In pain , limping   Cardiovascular:     Rate and Rhythm: Normal rate and regular rhythm.     Pulses: Normal pulses.     Heart sounds: Normal heart sounds.  Pulmonary:     Effort: Pulmonary effort is normal.     Breath sounds: Normal breath sounds.  Musculoskeletal:     Comments: Right great toe is swollen, warm, and very tender at the MTP joint   Neurological:     Mental Status: He is alert.           Assessment & Plan:  Acute gout flare. He was given a shot of DepoMedrol. Recheck as needed.  [name], MD

## 2021-09-01 ENCOUNTER — Other Ambulatory Visit: Payer: Self-pay | Admitting: Cardiology

## 2021-09-19 ENCOUNTER — Ambulatory Visit (INDEPENDENT_AMBULATORY_CARE_PROVIDER_SITE_OTHER): Payer: Medicare HMO

## 2021-09-19 DIAGNOSIS — I495 Sick sinus syndrome: Secondary | ICD-10-CM | POA: Diagnosis not present

## 2021-09-19 LAB — CUP PACEART REMOTE DEVICE CHECK
Battery Remaining Longevity: 49 mo
Battery Remaining Percentage: 44 %
Battery Voltage: 2.98 V
Brady Statistic AP VP Percent: 1 %
Brady Statistic AP VS Percent: 79 %
Brady Statistic AS VP Percent: 1 %
Brady Statistic AS VS Percent: 20 %
Brady Statistic RA Percent Paced: 79 %
Brady Statistic RV Percent Paced: 1 %
Date Time Interrogation Session: 20230725040014
Implantable Lead Implant Date: 20170501
Implantable Lead Implant Date: 20170501
Implantable Lead Location: 753859
Implantable Lead Location: 753860
Implantable Pulse Generator Implant Date: 20170501
Lead Channel Impedance Value: 400 Ohm
Lead Channel Impedance Value: 530 Ohm
Lead Channel Pacing Threshold Amplitude: 0.875 V
Lead Channel Pacing Threshold Amplitude: 1 V
Lead Channel Pacing Threshold Pulse Width: 0.5 ms
Lead Channel Pacing Threshold Pulse Width: 0.5 ms
Lead Channel Sensing Intrinsic Amplitude: 5 mV
Lead Channel Sensing Intrinsic Amplitude: 6 mV
Lead Channel Setting Pacing Amplitude: 1.875
Lead Channel Setting Pacing Amplitude: 2.5 V
Lead Channel Setting Pacing Pulse Width: 0.5 ms
Lead Channel Setting Sensing Sensitivity: 2 mV
Pulse Gen Model: 2272
Pulse Gen Serial Number: 7897836

## 2021-09-25 DIAGNOSIS — K648 Other hemorrhoids: Secondary | ICD-10-CM | POA: Diagnosis not present

## 2021-09-26 ENCOUNTER — Other Ambulatory Visit: Payer: Self-pay | Admitting: Family Medicine

## 2021-09-27 ENCOUNTER — Telehealth: Payer: Self-pay | Admitting: *Deleted

## 2021-09-27 NOTE — Telephone Encounter (Signed)
   Pre-operative Risk Assessment    Patient Name: Erik [name]  DOB: Feb 10, 1946 MRN: [id number]      Request for Surgical Clearance    Procedure:   HEMORRHOIDECTOMY  Date of Surgery:  Clearance TBD                                 Surgeon:  Nadeen Landau, MD Surgeon's Group or Practice Name:  [facility] Phone number:  [phone] Fax number:  [phone]  ATTN:  Malachi Bonds   Type of Clearance Requested:   - Pharmacy:  Hold Rivaroxaban (Xarelto) NOT INDICATED   Type of Anesthesia:  General    Additional requests/questions:    Astrid Divine   09/27/2021, 7:39 AM

## 2021-09-28 NOTE — Telephone Encounter (Signed)
   Name: Erik [name]  DOB: 1945/11/12  MRN: [id number]   Primary Cardiologist: Quay Burow, MD  Chart reviewed as part of pre-operative protocol coverage.  Erik [name] was last seen on 08/14/21 by Dr. Curt Bears.  The patient was doing well at that time.  Per our pharmacy staff, patient can hold Xarelto x2 days prior to procedure.  Patient will not need bridging with Lovenox (enoxaparin) around the procedure.  Restart anticoagulation when deemed safe to do so.  I will route this recommendation to the requesting party via Epic fax function and remove from pre-op pool. Please call with questions.  Elgie Collard, PA-C 09/28/2021, 1:14 PM

## 2021-09-28 NOTE — Telephone Encounter (Signed)
Patient with diagnosis of atrial fibrillation on Xarelto for anticoagulation.    Procedure: hemorrhoidectomy Date of procedure: TBD   CHA2DS2-VASc Score = 3   This indicates a 3.2% annual risk of stroke. The patient's score is based upon: CHF History: 0 HTN History: 1 Diabetes History: 0 Stroke History: 0 Vascular Disease History: 1 Age Score: 1 Gender Score: 0    CrCl 76 Platelet count 203  Per office protocol, patient can hold Xarelto for 2 days prior to procedure.   Patient will not need bridging with Lovenox (enoxaparin) around procedure.  **This guidance is not considered finalized until pre-operative APP has relayed final recommendations.**

## 2021-10-09 ENCOUNTER — Encounter (HOSPITAL_BASED_OUTPATIENT_CLINIC_OR_DEPARTMENT_OTHER): Payer: Self-pay | Admitting: Surgery

## 2021-10-09 ENCOUNTER — Other Ambulatory Visit: Payer: Self-pay

## 2021-10-09 ENCOUNTER — Encounter: Payer: Self-pay | Admitting: Cardiology

## 2021-10-09 NOTE — Progress Notes (Signed)
[facility]  Patient Information: Name:  Erik [name]  DOB:  [birth year]  MRN:  [id number]    Planned Procedure:  HEMORRHOIDECTOMY, TWO VERSUS THREE COLUMN, ANORECTAL EXAM UNDER ANESTHESIA  Surgeon:  DR Harrell Gave WHITE  Date of Procedure:  10-13-2021  Cautery will be used.  Position during surgery:     Please send documentation back to:  [facility] (Fax # [phone])   Device Information:  Clinic EP Physician:  Allegra Lai, MD   Device Type:  Pacemaker Manufacturer and Phone #:  St. Jude/Abbott: [phone] Pacemaker Dependent?:  No. Date of Last Device Check:  09/19/2021 Normal Device Function?:  Yes.    Electrophysiologist's Recommendations:  Have magnet available. Provide continuous ECG monitoring when magnet is used or reprogramming is to be performed.  Procedure should not interfere with device function.  No device programming or magnet placement needed.  Per Device Clinic Standing Orders, Wanda Plump, RN  2:40 PM 10/09/2021

## 2021-10-09 NOTE — Progress Notes (Addendum)
Spoke w/ via phone for pre-op interview---Pt wife Erik [name] per pt request Lab needs dos---- I stat              Lab results------ COVID test -----patient  wife Erik [name] states asymptomatic no test needed Arrive at -------1130 am 10-13-2021 NPO after MN NO Solid Food.  Clear liquids from MN until---1030 am Med rec completed Medications to take morning of surgery -----diltiazem, gabapentin, levothyroxine, prednisone, tamsulosin Diabetic medication -----n/a Patient instructed no nail polish to be worn day of surgery Patient instructed to bring photo id and insurance card day of surgery Patient aware to have Driver (ride ) / caregiver    wife Erik [name] for 24 hours after surgery  Patient Special Instructions -----note to stop xarelto 2 days before surgery Erik conte pa dated 09-28-2021 chart/epic  Pre-Op special Istructions -----none Patient verbalized understanding of instructions that were given at this phone interview. Patient  wife denies pt has  any cardiac S & S,  shortness of breath, chest pain, fever, cough at this phone interview.   Anesthesia Review: afib, sick sinus syndrome s/p pacemaker placement 2017,  nonobstructive cad, dyspnea on exertion  PCP:dr stephen fry lov 08-31-2021 Cardiologist :dr Barnie Del 08-14-2021 epic Chest x-ray :08-21-2019 epic EKG :08-14-2021 chart/epic Echo :08-21-2019 epic Stress test:none Cardiac Cath :  2004  Activity level:  can do normal household activity without problems Sleep Study/ CPAP : did not tolerate cpap mild osa per sleep study yrs ago Blood Thinner/ Instructions /Last Dose:Xarelto note to stop 2 days before surgery per tessa conte pa dated 09-28-2021 chart/epic, last dose to be 10-10-2021 per pt wife Erik [name].  Pacemaker orders received  from device clinic dr Curt Bears and placed on pt chart /epic for 10-13-2021 surgery.  Ring stuck left 4th finger

## 2021-10-11 ENCOUNTER — Ambulatory Visit: Payer: Self-pay | Admitting: Surgery

## 2021-10-12 ENCOUNTER — Telehealth: Payer: Self-pay | Admitting: Pharmacist

## 2021-10-12 NOTE — Chronic Care Management (AMB) (Signed)
Chronic Care Management Pharmacy Assistant   Name: Erik [name]  MRN: [id number] DOB: August 25, 1945  Reason for Encounter: Disease State   Conditions to be addressed/monitored: General Assessment  Recent office visits:  08/31/21 [name], MD - Patient presented for Acute gout involving toe of right foot unspecified cause. No medication changes.  06/21/21 [name], MD - Patient presented for  Photosensitivity dermatitis. Prescribed Triamcinolone Acetonide.  Recent consult visits:  09/25/21 Tami Lin, MD (Surg) - Patient presented for Hemorrhoid prolapse.No medication changes. To Hold Xarelto prior to procedure.  08/14/21 Camnitz, Ocie Doyne, MD (Cardiology) - Patient presented for Secondary hypercoagulable state and other concerns. No medication changes.  07/06/21 Lonia Skinner (Ophthalmology) - Claims encounter for Presence of intraocular lens and other concerns. No other visit details available.  Hospital visits:  Patient presented to [facility] for Hemorrhoidectomy.  START taking: oxyCODONE (Roxicodone  Medications: Outpatient Encounter Medications as of 10/12/2021  Medication Sig   acetaminophen (TYLENOL) 500 MG tablet Take 1,000 mg by mouth every 6 (six) hours as needed for moderate pain.    diltiazem (CARDIZEM CD) 120 MG 24 hr capsule TAKE 1 CAPSULE(120 MG) BY MOUTH DAILY   Famotidine (PEPCID PO) Take by mouth as needed.   furosemide (LASIX) 40 MG tablet TAKE 1 TABLET(40 MG) BY MOUTH DAILY (Patient taking differently: every evening. TAKE 1 TABLET(40 MG) BY MOUTH DAILY)   gabapentin (NEURONTIN) 600 MG tablet TAKE 1 TABLET(600 MG) BY MOUTH THREE TIMES DAILY   hydrochlorothiazide (HYDRODIURIL) 25 MG tablet TAKE 1 TABLET(25 MG) BY MOUTH DAILY (Patient taking differently: daily.)   levothyroxine (SYNTHROID) 50 MCG tablet TAKE 1 TABLET BY MOUTH EVERY DAY   losartan (COZAAR) 50 MG tablet Take 1 tablet (50 mg total) by mouth daily.  (Patient taking differently: Take 25 mg by mouth daily.)   potassium chloride (KLOR-CON) 10 MEQ tablet TAKE 1 TABLET(10 MEQ) BY MOUTH DAILY   predniSONE (DELTASONE) 10 MG tablet Take 1 tablet (10 mg total) by mouth daily with breakfast.   psyllium (METAMUCIL) 58.6 % powder Take 1 packet by mouth daily.   simvastatin (ZOCOR) 20 MG tablet TAKE 1 TABLET(20 MG) BY MOUTH DAILY AT 6 PM   tamsulosin (FLOMAX) 0.4 MG CAPS capsule Take 2 capsules (0.8 mg total) by mouth daily.   triamcinolone cream (KENALOG) 0.1 % Apply 1 application. topically 3 (three) times daily. (Patient taking differently: Apply 1 application  topically as needed.)   XARELTO 20 MG TABS tablet TAKE 1 TABLET(20 MG) BY MOUTH DAILY WITH SUPPER   No facility-administered encounter medications on file as of 10/12/2021.   [place] for General Review Call   Adherence Review:  Does the Clinical Pharmacist Assistant have access to adherence rates? Yes Adherence rates for STAR metric medications Losartan (Cozaar) 100 mg - Last filled 01/18/21 90 DS at [facility] Losartan (Cozaar) 100 mg - Last filled 10/11/20 90 DS at [facility] Simvastatin (Zocor) 20 mg - Last filled 09/01/21 90 DS at [facility] Simvastatin (Zocor) 20 mg - Last filled 08/02/21 30 DS at [facility]  Does the patient have >5 day gap between last estimated fill dates for any of the above medications or other medication gaps? Yes Reason for medication gaps. Wife reports he had 180 tabs and is taking half of the 100 mg currently.   Disease State Questions:  Able to connect with Patient? Yes Spoke to patients wife Did patient have any problems with their health recently? No  Note problems and Concerns: Wife reports he is recovering from recent surgery. Have you had any admissions or emergency room visits or worsening of your condition(s) since last visit? No  Have you had any visits with new specialists or providers since your last visit? No  Have you had  any new health care problem(s) since your last visit? No  Have you run out of any of your medications since you last spoke with clinical pharmacist? No  Are there any medications you are not taking as prescribed? No  Are you having any issues or side effects with your medications? No  Do you have any other health concerns or questions you want to discuss with your Clinical Pharmacist before your next visit? No  Are there any health concerns that you feel we can do a better job addressing? No Note Patient's response. Are you having any problems with any of the following since the last visit: (select all that apply)  None per wife  42. Any falls since last visit? No  13. Any increased or uncontrolled pain since last visit? No    Care Gaps: Zoster Vaccine - Overdue TDAP - Overdue COVID Booster - Overdue Flu Vaccine - Overdue CCM- Declined BP- 110/60 08/31/21 AWV- 1/23   Star Rating Drugs: Losartan (Cozaar) 100 mg - Last filled 01/18/21 90 DS at [facility] Verified Simvastatin (Zocor) 20 mg - Last filled 09/01/21 90 DS at [facility] Pharmacist Assistant [phone]

## 2021-10-13 ENCOUNTER — Ambulatory Visit (HOSPITAL_BASED_OUTPATIENT_CLINIC_OR_DEPARTMENT_OTHER)
Admission: RE | Admit: 2021-10-13 | Discharge: 2021-10-13 | Disposition: A | Discharge status: Home / Self Care | Payer: Medicare HMO | Attending: Surgery | Admitting: Surgery

## 2021-10-13 ENCOUNTER — Encounter (HOSPITAL_BASED_OUTPATIENT_CLINIC_OR_DEPARTMENT_OTHER): Payer: Self-pay | Admitting: Surgery

## 2021-10-13 ENCOUNTER — Encounter (HOSPITAL_BASED_OUTPATIENT_CLINIC_OR_DEPARTMENT_OTHER)
Admission: RE | Disposition: A | Discharge status: Home / Self Care | Payer: Self-pay | Source: Home / Self Care | Attending: Surgery

## 2021-10-13 ENCOUNTER — Other Ambulatory Visit: Payer: Self-pay

## 2021-10-13 ENCOUNTER — Ambulatory Visit (HOSPITAL_BASED_OUTPATIENT_CLINIC_OR_DEPARTMENT_OTHER): Payer: Medicare HMO | Admitting: Anesthesiology

## 2021-10-13 DIAGNOSIS — I480 Paroxysmal atrial fibrillation: Secondary | ICD-10-CM | POA: Diagnosis not present

## 2021-10-13 DIAGNOSIS — Z87891 Personal history of nicotine dependence: Secondary | ICD-10-CM | POA: Insufficient documentation

## 2021-10-13 DIAGNOSIS — K644 Residual hemorrhoidal skin tags: Secondary | ICD-10-CM | POA: Diagnosis not present

## 2021-10-13 DIAGNOSIS — I129 Hypertensive chronic kidney disease with stage 1 through stage 4 chronic kidney disease, or unspecified chronic kidney disease: Secondary | ICD-10-CM

## 2021-10-13 DIAGNOSIS — I2510 Atherosclerotic heart disease of native coronary artery without angina pectoris: Secondary | ICD-10-CM

## 2021-10-13 DIAGNOSIS — Z955 Presence of coronary angioplasty implant and graft: Secondary | ICD-10-CM | POA: Insufficient documentation

## 2021-10-13 DIAGNOSIS — N189 Chronic kidney disease, unspecified: Secondary | ICD-10-CM | POA: Diagnosis not present

## 2021-10-13 DIAGNOSIS — Z6836 Body mass index (BMI) 36.0-36.9, adult: Secondary | ICD-10-CM | POA: Insufficient documentation

## 2021-10-13 DIAGNOSIS — K645 Perianal venous thrombosis: Secondary | ICD-10-CM | POA: Diagnosis not present

## 2021-10-13 DIAGNOSIS — E669 Obesity, unspecified: Secondary | ICD-10-CM | POA: Insufficient documentation

## 2021-10-13 DIAGNOSIS — Z7901 Long term (current) use of anticoagulants: Secondary | ICD-10-CM | POA: Insufficient documentation

## 2021-10-13 DIAGNOSIS — K219 Gastro-esophageal reflux disease without esophagitis: Secondary | ICD-10-CM | POA: Diagnosis not present

## 2021-10-13 DIAGNOSIS — I10 Essential (primary) hypertension: Secondary | ICD-10-CM | POA: Diagnosis not present

## 2021-10-13 DIAGNOSIS — K648 Other hemorrhoids: Secondary | ICD-10-CM | POA: Insufficient documentation

## 2021-10-13 DIAGNOSIS — N183 Chronic kidney disease, stage 3 unspecified: Secondary | ICD-10-CM | POA: Diagnosis not present

## 2021-10-13 DIAGNOSIS — I739 Peripheral vascular disease, unspecified: Secondary | ICD-10-CM | POA: Diagnosis not present

## 2021-10-13 DIAGNOSIS — K649 Unspecified hemorrhoids: Secondary | ICD-10-CM

## 2021-10-13 DIAGNOSIS — E039 Hypothyroidism, unspecified: Secondary | ICD-10-CM | POA: Diagnosis not present

## 2021-10-13 DIAGNOSIS — G4730 Sleep apnea, unspecified: Secondary | ICD-10-CM | POA: Diagnosis not present

## 2021-10-13 DIAGNOSIS — Z01818 Encounter for other preprocedural examination: Secondary | ICD-10-CM

## 2021-10-13 HISTORY — PX: HEMORRHOID SURGERY: SHX153

## 2021-10-13 HISTORY — DX: Malignant (primary) neoplasm, unspecified: C80.1

## 2021-10-13 HISTORY — DX: Gastro-esophageal reflux disease without esophagitis: K21.9

## 2021-10-13 HISTORY — DX: Gout, unspecified: M10.9

## 2021-10-13 HISTORY — PX: RECTAL EXAM UNDER ANESTHESIA: SHX6399

## 2021-10-13 LAB — POCT I-STAT, CHEM 8
BUN: 19 mg/dL (ref 8–23)
Calcium, Ion: 1.2 mmol/L (ref 1.15–1.40)
Chloride: 105 mmol/L (ref 98–111)
Creatinine, Ser: 1.4 mg/dL — ABNORMAL HIGH (ref 0.61–1.24)
Glucose, Bld: 107 mg/dL — ABNORMAL HIGH (ref 70–99)
HCT: 40 % (ref 39.0–52.0)
Hemoglobin: 13.6 g/dL (ref 13.0–17.0)
Potassium: 3.7 mmol/L (ref 3.5–5.1)
Sodium: 144 mmol/L (ref 135–145)
TCO2: 28 mmol/L (ref 22–32)

## 2021-10-13 SURGERY — HEMORRHOIDECTOMY
Anesthesia: General | Site: Rectum

## 2021-10-13 MED ORDER — LIDOCAINE 2% (20 MG/ML) 5 ML SYRINGE
INTRAMUSCULAR | Status: DC | PRN
  Administered 2021-10-13: 60 mg via INTRAVENOUS

## 2021-10-13 MED ORDER — FENTANYL CITRATE (PF) 250 MCG/5ML IJ SOLN
INTRAMUSCULAR | Status: DC | PRN
Start: 2021-10-13 — End: 2021-10-13
  Administered 2021-10-13: 50 ug via INTRAVENOUS

## 2021-10-13 MED ORDER — PHENYLEPHRINE 80 MCG/ML (10ML) SYRINGE FOR IV PUSH (FOR BLOOD PRESSURE SUPPORT)
PREFILLED_SYRINGE | INTRAVENOUS | Status: DC | PRN
  Administered 2021-10-13: 300 ug via INTRAVENOUS

## 2021-10-13 MED ORDER — FENTANYL CITRATE (PF) 100 MCG/2ML IJ SOLN: 25.0000 ug | INTRAMUSCULAR | Status: DC | PRN

## 2021-10-13 MED ORDER — CEFAZOLIN SODIUM-DEXTROSE 2-4 GM/100ML-% IV SOLN
INTRAVENOUS | Status: AC
  Filled 2021-10-13: qty 100

## 2021-10-13 MED ORDER — SUGAMMADEX SODIUM 500 MG/5ML IV SOLN
INTRAVENOUS | Status: AC
  Filled 2021-10-13: qty 5

## 2021-10-13 MED ORDER — DEXAMETHASONE SODIUM PHOSPHATE 10 MG/ML IJ SOLN
INTRAMUSCULAR | Status: DC | PRN
  Administered 2021-10-13: 5 mg via INTRAVENOUS

## 2021-10-13 MED ORDER — DIBUCAINE (PERIANAL) 1 % EX OINT
TOPICAL_OINTMENT | CUTANEOUS | Status: AC
  Filled 2021-10-13: qty 28

## 2021-10-13 MED ORDER — OXYCODONE HCL 5 MG/5ML PO SOLN: 5.0000 mg | Freq: Once | ORAL | Status: DC | PRN

## 2021-10-13 MED ORDER — BUPIVACAINE LIPOSOME 1.3 % IJ SUSP
INTRAMUSCULAR | Status: AC
  Filled 2021-10-13: qty 20

## 2021-10-13 MED ORDER — ACETAMINOPHEN 500 MG PO TABS
1000.0000 mg | ORAL_TABLET | ORAL | Status: AC
  Administered 2021-10-13: 1000 mg via ORAL

## 2021-10-13 MED ORDER — ROCURONIUM BROMIDE 10 MG/ML (PF) SYRINGE
PREFILLED_SYRINGE | INTRAVENOUS | Status: DC | PRN
  Administered 2021-10-13: 50 mg via INTRAVENOUS

## 2021-10-13 MED ORDER — DIBUCAINE (PERIANAL) 1 % EX OINT
TOPICAL_OINTMENT | CUTANEOUS | Status: DC | PRN
  Administered 2021-10-13: 1 via RECTAL

## 2021-10-13 MED ORDER — ACETAMINOPHEN 500 MG PO TABS
ORAL_TABLET | ORAL | Status: AC
  Filled 2021-10-13: qty 2

## 2021-10-13 MED ORDER — FENTANYL CITRATE (PF) 100 MCG/2ML IJ SOLN
INTRAMUSCULAR | Status: AC
  Filled 2021-10-13: qty 2

## 2021-10-13 MED ORDER — OXYCODONE HCL 5 MG PO TABS: 5.0000 mg | ORAL_TABLET | Freq: Three times a day (TID) | ORAL | 0 refills | Status: DC | PRN

## 2021-10-13 MED ORDER — CHLORHEXIDINE GLUCONATE CLOTH 2 % EX PADS: 6.0000 | MEDICATED_PAD | Freq: Once | CUTANEOUS | Status: DC

## 2021-10-13 MED ORDER — MIDAZOLAM HCL 2 MG/2ML IJ SOLN
INTRAMUSCULAR | Status: AC
  Filled 2021-10-13: qty 2

## 2021-10-13 MED ORDER — BUPIVACAINE-EPINEPHRINE 0.25% -1:200000 IJ SOLN
INTRAMUSCULAR | Status: DC | PRN
  Administered 2021-10-13: 50 mL

## 2021-10-13 MED ORDER — SODIUM CHLORIDE 0.9 % IV SOLN: INTRAVENOUS | Status: DC

## 2021-10-13 MED ORDER — MIDAZOLAM HCL 2 MG/2ML IJ SOLN
INTRAMUSCULAR | Status: DC | PRN
  Administered 2021-10-13: 1 mg via INTRAVENOUS

## 2021-10-13 MED ORDER — PROPOFOL 10 MG/ML IV BOLUS
INTRAVENOUS | Status: DC | PRN
  Administered 2021-10-13: 140 mg via INTRAVENOUS

## 2021-10-13 MED ORDER — BUPIVACAINE LIPOSOME 1.3 % IJ SUSP: 20.0000 mL | Freq: Once | INTRAMUSCULAR | Status: DC

## 2021-10-13 MED ORDER — CEFAZOLIN SODIUM-DEXTROSE 2-4 GM/100ML-% IV SOLN
2.0000 g | INTRAVENOUS | Status: AC
  Administered 2021-10-13: 2 g via INTRAVENOUS

## 2021-10-13 MED ORDER — OXYCODONE HCL 5 MG PO TABS: 5.0000 mg | ORAL_TABLET | Freq: Once | ORAL | Status: DC | PRN

## 2021-10-13 MED ORDER — ONDANSETRON HCL 4 MG/2ML IJ SOLN
INTRAMUSCULAR | Status: DC | PRN
  Administered 2021-10-13: 4 mg via INTRAVENOUS

## 2021-10-13 MED ORDER — BUPIVACAINE-EPINEPHRINE (PF) 0.25% -1:200000 IJ SOLN
INTRAMUSCULAR | Status: AC
  Filled 2021-10-13: qty 30

## 2021-10-13 MED ORDER — 0.9 % SODIUM CHLORIDE (POUR BTL) OPTIME
TOPICAL | Status: DC | PRN
  Administered 2021-10-13: 1000 mL

## 2021-10-13 SURGICAL SUPPLY — 70 items
APL SKNCLS STERI-STRIP NONHPOA (GAUZE/BANDAGES/DRESSINGS) ×1
BENZOIN TINCTURE PRP APPL 2/3 (GAUZE/BANDAGES/DRESSINGS) ×1 IMPLANT
BLADE EXTENDED COATED 6.5IN (ELECTRODE) ×1 IMPLANT
BLADE SURG 10 STRL SS (BLADE) IMPLANT
BLADE SURG 15 STRL LF DISP TIS (BLADE) ×1 IMPLANT
BLADE SURG 15 STRL SS (BLADE) ×1
COVER BACK TABLE 60X90IN (DRAPES) ×1 IMPLANT
COVER MAYO STAND STRL (DRAPES) ×1 IMPLANT
DECANTER SPIKE VIAL GLASS SM (MISCELLANEOUS) ×1 IMPLANT
DRAPE HYSTEROSCOPY (MISCELLANEOUS) IMPLANT
DRAPE LAPAROTOMY 100X72 PEDS (DRAPES) ×1 IMPLANT
DRAPE SHEET LG 3/4 BI-LAMINATE (DRAPES) IMPLANT
DRAPE UTILITY XL STRL (DRAPES) ×1 IMPLANT
DRSG PAD ABDOMINAL 8X10 ST (GAUZE/BANDAGES/DRESSINGS) ×1 IMPLANT
ELECT REM PT RETURN 9FT ADLT (ELECTROSURGICAL) ×1
ELECTRODE REM PT RTRN 9FT ADLT (ELECTROSURGICAL) ×1 IMPLANT
GAUZE 4X4 16PLY ~~LOC~~+RFID DBL (SPONGE) ×1 IMPLANT
GAUZE SPONGE 4X4 12PLY STRL (GAUZE/BANDAGES/DRESSINGS) ×1 IMPLANT
GLOVE BIO SURGEON STRL SZ7.5 (GLOVE) ×1 IMPLANT
GLOVE BIOGEL PI IND STRL 8 (GLOVE) ×1 IMPLANT
GLOVE BIOGEL PI INDICATOR 8 (GLOVE) ×1
GOWN STRL REUS W/TWL LRG LVL3 (GOWN DISPOSABLE) ×1 IMPLANT
GOWN STRL REUS W/TWL XL LVL3 (GOWN DISPOSABLE) ×1 IMPLANT
HYDROGEN PEROXIDE 16OZ (MISCELLANEOUS) IMPLANT
IV CATH 14GX2 1/4 (CATHETERS) IMPLANT
IV CATH 18G SAFETY (IV SOLUTION) IMPLANT
KIT SIGMOIDOSCOPE (SET/KITS/TRAYS/PACK) IMPLANT
KIT TURNOVER CYSTO (KITS) ×1 IMPLANT
LEGGING LITHOTOMY PAIR STRL (DRAPES) IMPLANT
LIGASURE 7.4 SM JAW OPEN (ELECTROSURGICAL) IMPLANT
LOOP VESSEL MAXI BLUE (MISCELLANEOUS) IMPLANT
NDL HYPO 25X1 1.5 SAFETY (NEEDLE) IMPLANT
NDL SAFETY ECLIPSE 18X1.5 (NEEDLE) IMPLANT
NEEDLE HYPO 18GX1.5 SHARP (NEEDLE)
NEEDLE HYPO 22GX1.5 SAFETY (NEEDLE) ×1 IMPLANT
NEEDLE HYPO 25X1 1.5 SAFETY (NEEDLE) IMPLANT
NS IRRIG 500ML POUR BTL (IV SOLUTION) ×1 IMPLANT
PACK BASIN DAY SURGERY FS (CUSTOM PROCEDURE TRAY) ×1 IMPLANT
PAD ARMBOARD 7.5X6 YLW CONV (MISCELLANEOUS) IMPLANT
PANTS MESH DISP LRG (UNDERPADS AND DIAPERS) ×1 IMPLANT
PENCIL SMOKE EVACUATOR (MISCELLANEOUS) ×1 IMPLANT
SPONGE HEMORRHOID 8X3CM (HEMOSTASIS) IMPLANT
SPONGE SURGIFOAM ABS GEL 100 (HEMOSTASIS) IMPLANT
SPONGE SURGIFOAM ABS GEL 12-7 (HEMOSTASIS) IMPLANT
SUT CHROMIC 2 0 SH (SUTURE) IMPLANT
SUT CHROMIC 3 0 SH 27 (SUTURE) IMPLANT
SUT MNCRL AB 4-0 PS2 18 (SUTURE) IMPLANT
SUT SILK 0 TIES 10X30 (SUTURE) ×1 IMPLANT
SUT SILK 2 0 (SUTURE)
SUT SILK 2 0 SH (SUTURE) ×1 IMPLANT
SUT SILK 2-0 18XBRD TIE 12 (SUTURE) IMPLANT
SUT VIC AB 2-0 SH 27 (SUTURE)
SUT VIC AB 2-0 SH 27XBRD (SUTURE) IMPLANT
SUT VIC AB 2-0 UR6 27 (SUTURE) ×1 IMPLANT
SUT VIC AB 3-0 SH 18 (SUTURE) IMPLANT
SUT VIC AB 3-0 SH 27 (SUTURE) ×1
SUT VIC AB 3-0 SH 27X BRD (SUTURE) ×1 IMPLANT
SUT VIC AB 3-0 SH 27XBRD (SUTURE) IMPLANT
SUT VIC AB 4-0 P-3 18XBRD (SUTURE) IMPLANT
SUT VIC AB 4-0 P3 18 (SUTURE)
SYR 20ML LL LF (SYRINGE) IMPLANT
SYR BULB EAR ULCER 3OZ GRN STR (SYRINGE) IMPLANT
SYR BULB IRRIG 60ML STRL (SYRINGE) ×1 IMPLANT
SYR CONTROL 10ML LL (SYRINGE) ×1 IMPLANT
SYR TB 1ML LL NO SAFETY (SYRINGE) IMPLANT
TOWEL OR 17X26 10 PK STRL BLUE (TOWEL DISPOSABLE) ×1 IMPLANT
TRAY DSU PREP LF (CUSTOM PROCEDURE TRAY) ×1 IMPLANT
TUBE CONNECTING 12X1/4 (SUCTIONS) ×1 IMPLANT
WATER STERILE IRR 500ML POUR (IV SOLUTION) IMPLANT
YANKAUER SUCT BULB TIP NO VENT (SUCTIONS) ×1 IMPLANT

## 2021-10-13 NOTE — Anesthesia Procedure Notes (Signed)
Procedure Name: Intubation Date/Time: 10/13/2021 1:55 PM  Performed by: Clearnce Sorrel, CRNAPre-anesthesia Checklist: Patient identified, Emergency Drugs available, Suction available and Patient being monitored Patient Re-evaluated:Patient Re-evaluated prior to induction Oxygen Delivery Method: Circle System Utilized Preoxygenation: Pre-oxygenation with 100% oxygen Induction Type: IV induction Ventilation: Mask ventilation without difficulty Laryngoscope Size: Mac and 4 Grade View: Grade I Tube type: Oral Tube size: 7.5 mm Number of attempts: 1 Airway Equipment and Method: Stylet and Oral airway Placement Confirmation: ETT inserted through vocal cords under direct vision, positive ETCO2 and breath sounds checked- equal and bilateral Secured at: 24 cm Tube secured with: Tape Dental Injury: Teeth and Oropharynx as per pre-operative assessment

## 2021-10-13 NOTE — Anesthesia Preprocedure Evaluation (Addendum)
Anesthesia Evaluation  Patient identified by MRN, date of birth, ID band Patient awake    Reviewed: Allergy & Precautions, NPO status , Patient's Chart, lab work & pertinent test results  History of Anesthesia Complications Negative for: history of anesthetic complications  Airway Mallampati: I  TM Distance: >3 FB Neck ROM: Full    Dental  (+) Upper Dentures, Lower Dentures   Pulmonary sleep apnea , former smoker,    Pulmonary exam normal        Cardiovascular hypertension, Pt. on medications (-) angina+ CAD and + Peripheral Vascular Disease  + dysrhythmias (Tachy-brady syndrome ) Atrial Fibrillation + pacemaker  Rhythm:Irregular Rate:Normal   '21 TTE - EF 60 to 65%. There is mild asymmetric left ventricular hypertrophy. Trivial  MR    Neuro/Psych PSYCHIATRIC DISORDERS Anxiety negative neurological ROS     GI/Hepatic Neg liver ROS, GERD  Controlled and Medicated,  Endo/Other  Hypothyroidism  Obesity   Renal/GU CRFRenal disease     Musculoskeletal  (+) Arthritis ,  Gout    Abdominal   Peds  Hematology  On xarelto    Anesthesia Other Findings   Reproductive/Obstetrics                            Anesthesia Physical Anesthesia Plan  ASA: 3  Anesthesia Plan: General   Post-op Pain Management: Tylenol PO (pre-op)*   Induction: Intravenous  PONV Risk Score and Plan: 2 and Treatment may vary due to age or medical condition, Ondansetron and Dexamethasone  Airway Management Planned: Oral ETT  Additional Equipment: None  Intra-op Plan:   Post-operative Plan: Extubation in OR  Informed Consent: I have reviewed the patients History and Physical, chart, labs and discussed the procedure including the risks, benefits and alternatives for the proposed anesthesia with the patient or authorized representative who has indicated his/her understanding and acceptance.     Dental advisory  given  Plan Discussed with: CRNA and Anesthesiologist  Anesthesia Plan Comments:        Anesthesia Quick Evaluation

## 2021-10-13 NOTE — Transfer of Care (Signed)
Immediate Anesthesia Transfer of Care Note  Patient: Erik [name]  Procedure(s) Performed: HEMORRHOIDECTOMY 2 vs 3 COLUMN, DISIMPACTION (Rectum) ANORECTAL EXAM UNDER ANESTHESIA (Rectum)  Patient Location: PACU  Anesthesia Type:General  Level of Consciousness: awake, alert  and oriented  Airway & Oxygen Therapy: Patient Spontanous Breathing  Post-op Assessment: Report given to RN and Post -op Vital signs reviewed and stable  Post vital signs: Reviewed and stable  Last Vitals:  Vitals Value Taken Time  BP 118/48 10/13/21 1505  Temp 36.3 C 10/13/21 1505  Pulse 62 10/13/21 1510  Resp 14 10/13/21 1510  SpO2 91 % 10/13/21 1510  Vitals shown include unvalidated device data.  Last Pain:  Vitals:   10/13/21 1505  TempSrc:   PainSc: 0-No pain      Patients Stated Pain Goal: 4 (13/08/65 7846)  Complications: No notable events documented.

## 2021-10-13 NOTE — H&P (Signed)
CC: Hemorrhoids, here today for surgery  HPI: Erik [name] is an 76 y.o. male with history of HTN, hypothyroidism, afib (on Xarelto, follows with Dr. Curt Bears), pacemaker, whom is seen in the office today as a referral by Dr. Sarajane Jews for evaluation of possible hemorrhoids.  Underwent colonoscopy with Dr. Carlean Purl 08/17/2020:  - Hemorrhoids were found on rectal exam. - External hemorrhoids were found. - A 3 mm polyp was found in the transverse colon. The polyp was sessile. The polyp was removed with a cold snare. Resection and retrieval were complete. Verification of patient identification for the specimen was done. Estimated blood loss was minimal. - A 1 mm polyp was found in the transverse colon. The polyp was sessile. The polyp was removed with a cold biopsy forceps. Resection and retrieval were complete. Verification of patient identification for the specimen was done. Estimated blood loss was minimal. - Scattered diverticula were found in the sigmoid colon and ascending colon. - The exam was otherwise without abnormality on direct and retroflexion views.  He reports a multiyear history now of tissue prolapse and rectal bleeding. He denies any anal pain. He denies any history of anal pain.  He reports he has been banded on 2 separate occasions for bleeding and tissue prolapse. With each of these, he reports temporary improvement with subsequent recurrence. The second banding he reports significant pain and discomfort for the days following.  INTERVAL HX Continues staying hydrated. Taking now daily fiber and stools soft, no straining. Working on commode times - previously 20-45 mins. Unable to get down to the 2 to 3-minute mark, not realistic for him. Still has ongoing issues with bright red blood per rectum occasionally. Last episode was 2 days ago. He denies any anal pain.  PMH: HTN, hypothyroidism, afib (on Xarelto, follows with Dr. Curt Bears), pacemaker,  PSH: Banding x2 in  past  FHx: Reports his mother had breast cancer. He denies any other known family history of colorectal, breast, endometrial or ovarian cancer  Social Hx: Denies use of tobacco/illicit drug. Occasional/social EtOH use. He is happily retired. He is here today with his wife whom is a retired Marine scientist from [employer] - worked as a Mudlogger.   Past Medical History:  Diagnosis Date   Anxiety    Arthritis    Atypical chest pain 06/26/2015   BPH with urinary obstruction    Cancer (HCC)BASAL CELL SKIN CANCER    Carotid disease, bilateral ([place])    a. 09/2014 Carotid U/S: 1-39% bilat ICA stenosis.   Chronic lower back pain    CKD (chronic kidney disease), stage III ([place])    pt. [name] denies   Diverticulitis 12/2016   Diverticulosis    Dyspnea    WITH EXERTION   GERD (gastroesophageal reflux disease)    Gout attack    LAST FLARE 08-31-2021 ALL SYMPTOMS RESOLVED   History of kidney stones    Hx of adenomatous colonic polyps 07/2020   2 diminutive   Hyperlipidemia    Hypertension    Hypothyroidism    Internal hemorrhoids    Long QT interval 06/26/2015   Nephrolithiasis    Neuropathy 07/30/2017   TOES ON BOTH FEET   Non-obstructive CAD    a. 07/2002 Cath: LM 20, LAD 106md, LCX 50-634mOM1 4077mCA 36m35m 60%; b. 05/2014 MV: low risk w/ small sized, mild intensity rev defect in apical/inferior/infsept area, nl EF->Med Rx.   Panic attack    NONE RECENT PER WIFE 10-09-2021   Paroxysmal atrial  fibrillation ([place])    a. Dx 04/2014; b. 05/2014 Echo: Ef 60-65%, no rwma, triv MR/TR, nl RV;  c. CHA2DS2VASc = 3-->was on eliquis, switched to xarelto 04/2015 2/2 cost; d. 05/2015 Tikosyn loaded w/ conversion to AF; e. 06/2015 QTc prolongation and bradycardia->tikosyn d/c'd, PPM placed, Amio started; f. 07/01/2015 In Aflutter @ clinic f/u.   Paroxysmal atrial flutter ([place])    a. 06/2015 noted to be in rapid Aflutter in Afib clinic-->amio load continued.   Persistent atrial fibrillation ([place]) 06/21/2015   Presence of  permanent cardiac pacemaker    St. Jude   Prolapsed internal hemorrhoids, grade 2-3 10/19/2016   LL Gr 3 and RP/RA Gr 2 LL banded 10/19/2016    RLL pneumonia 11/05/2012   Sleep apnea    "can't tolerate mask" (05/17/2017)   Tachy-brady syndrome ([place])    a. 06/27/2015 s/p SJM DC PPM (ser # @ [id number]).    Past Surgical History:  Procedure Laterality Date   ATRIAL FIBRILLATION ABLATION N/A 05/17/2017   Procedure: ATRIAL FIBRILLATION ABLATION;  Surgeon: Constance Haw, MD;  Location: [facility];  Service: Cardiovascular;  Laterality: N/A;   CARDIAC CATHETERIZATION  ~ 2000   CLOSED REDUCTION HAND FRACTURE Right 1984   COLONOSCOPY  06/27/2015   per Dr. Henrene Pastor, internal hemorrhoids and diverticulae, repeat 10 yrs   COLONSCOPY     FEB OR MARCH 2023 AT DR Carlean Purl OFFICE   CYSTOSCOPY WITH RETROGRADE PYELOGRAM, URETEROSCOPY AND STENT PLACEMENT Bilateral 02/14/2018   Procedure: CYSTOSCOPY WITH RETROGRADE PYELOGRAM, URETEROSCOPY AND STENT PLACEMENT;  Surgeon: Alexis Frock, MD;  Location: [facility];  Service: Urology;  Laterality: Bilateral;   EP IMPLANTABLE DEVICE N/A 06/27/2015   Procedure: Pacemaker Implant;  Surgeon: Evans Lance, MD;  Location: [facility];  Service: Cardiovascular;  Laterality: N/A;   EYE SURGERY  08/03/2019   Cataract   EYE SURGERY  08/17/2019   Cataact   FRACTURE SURGERY     right hand,finger YRS AGO   HEMORRHOID BANDING     DONE SEVERAL TIMES LAST 4 TO 5 YRS AGOPER WIFE 10-09-2021   HOLMIUM LASER APPLICATION Bilateral 21/30/8657   Procedure: HOLMIUM LASER APPLICATION;  Surgeon: Alexis Frock, MD;  Location: [facility];  Service: Urology;  Laterality: Bilateral;   LACERATION REPAIR Right 1984   "hand"   POSTERIOR LUMBAR FUSION  1998   2 lumbar discs, Dr. Ellene Route; "ray cages"   SKIN CANCER EXCISION  09/2019   TOTAL HIP ARTHROPLASTY Left 09/01/2019   Procedure: LEFT TOTAL HIP ARTHROPLASTY ANTERIOR APPROACH;  Surgeon: Melrose Nakayama, MD;  Location: [facility];  Service: Orthopedics;  Laterality: Left;    Family History  Problem Relation Age of Onset   Dementia Mother    Stroke Father    Diabetes Paternal Grandmother    Stroke Paternal Aunt        x 2   Cancer Paternal Aunt        type unknown   Colon cancer Neg Hx    Stomach cancer Neg Hx     Social:  reports that he quit smoking about 26 years ago. His smoking use included cigarettes. He has a 56.00 pack-year smoking history. He has never used smokeless tobacco. He reports current alcohol use of about 1.0 standard drink of alcohol per week. He reports that he does not use drugs.  Allergies: No Known Allergies  Medications: I have reviewed the patient's current medications.  No results found for this or any previous visit (from  the past 48 hour(s)).  No results found.  ROS - all of the below systems have been reviewed with the patient and positives are indicated with bold text General: chills, fever or night sweats Eyes: blurry vision or double vision ENT: epistaxis or sore throat Allergy/Immunology: itchy/watery eyes or nasal congestion Hematologic/Lymphatic: bleeding problems, blood clots or swollen lymph nodes Endocrine: temperature intolerance or unexpected weight changes Breast: new or changing breast lumps or nipple discharge Resp: cough, shortness of breath, or wheezing CV: chest pain or dyspnea on exertion GI: as per HPI GU: dysuria, trouble voiding, or hematuria MSK: joint pain or joint stiffness Neuro: TIA or stroke symptoms Derm: pruritus and skin lesion changes Psych: anxiety and depression  PE Height '5\' 9"'$  (1.753 m), weight 113.4 kg. Constitutional: NAD; conversant Eyes: Moist conjunctiva Lungs: Normal respiratory effort CV: RRR; no pitting edema MSK: Normal range of motion of extremities; no clubbing/cyanosis Psychiatric: Appropriate affect; alert and oriented x3  No results found for this or any previous visit (from the past 48 hour(s)).  No results  found.   A/P: DRESDEN LOZITO is an 76 y.o. male with hx of HTN, hypothyroidism, afib (on Xarelto, follows with Dr. Curt Bears), pacemaker, here for follow-up evaluation of what appears to be primarily internal hemorrhoids - progressive/worsening despite attempts at banding  -Cardiac clearance from Dr. Curt Bears with plans to hold Xarelto perioperatively -The anatomy and physiology of the anal canal was discussed with the patient with associated pictures. The pathophysiology of hemorrhoids was discussed at length as well -We have reviewed options going forward including further observation vs surgery - with medical attempts, has not achieved resolution to the degree that his symptoms are controlled. We therefore discussed surgery as an option - hemorrhoidectomy, anorectal exam under anesthesia. -The planned procedure, material risks (including, but not limited to, pain, bleeding, infection, scarring, need for blood transfusion, damage to anal sphincter, incontinence of gas and/or stool, need for additional procedures, anal stenosis, rare cases of pelvic sepsis which in severe cases may require things like a colostomy, recurrence, pneumonia, heart attack, stroke, death) benefits and alternatives to surgery were discussed at length. I noted a good probability that the procedure would help improve their symptoms. The patient's questions were answered to his satisfaction, he voiced understanding and elected to proceed with surgery. Additionally, we discussed typical postoperative expectations and the recovery process.  Nadeen Landau, [facility], [place]

## 2021-10-13 NOTE — Op Note (Signed)
10/13/2021  3:23 PM  PATIENT:  Erik [name]  76 y.o. male  Patient Care Team: [name], MD as PCP - General (Family Medicine) Constance Haw, MD as PCP - Electrophysiology (Cardiology) Lorretta Harp, MD as PCP - Cardiology (Cardiology) Viona Gilmore, [facility] as Pharmacist (Pharmacist)  PRE-OPERATIVE DIAGNOSIS:  Hemorrhoids  POST-OPERATIVE DIAGNOSIS:  Same  PROCEDURE:   3 column hemorrhoidectomy Anorectal exam under anesthesia  SURGEON:  Surgeon(s): Isabellamarie Randa, Sharon Mt, MD  ASSISTANT: Gwynn Burly MD   ANESTHESIA:   local and general  SPECIMEN:   Right anterior hemorrhoidal tissue Right posterior hemorrhoidal tissue Left lateral hemorrhoidal tissue  DISPOSITION OF SPECIMEN:  PATHOLOGY  COUNTS:  Sponge, needle, and instrument counts were reported correct x2 at conclusion.  EBL: 5 mL  Drains: None  PLAN OF CARE: Discharge to home after PACU  PATIENT DISPOSITION:  PACU - hemodynamically stable.  OR FINDINGS: Large engorged internal hemorrhoids, thrombosed external hemorrhoid (acute). Rabbit pellet stool in rectum  DESCRIPTION: The patient was identified in the preoperative holding area and taken to the OR. SCDs were applied. He then underwent general endotracheal anesthesia without difficulty. The patient was then rolled onto the OR table in the prone jackknife position. Pressure points were then evaluated and padded. Benzoin was applied to the buttocks and they were gently taped apart.  He was then prepped and draped in usual sterile fashion.  A surgical timeout was performed indicating the correct patient, procedure, and positioning.  A perianal block was then created using a dilute mixture of 0.25% Marcaine with epinephrine and Exparel.  After ascertaining an appropriate level of anesthesia had been achieved, a well lubricated digital rectal exam was performed. This demonstrated no palpable masses.  Formed rabbit pellet stool in the rectum..  A  Hill-Ferguson anoscope was into the anal canal and circumferential inspection demonstrated 3 column internal hemorrhoids-most significant being right anterior and right posterior.  Very engorged.  Thrombosed external hemorrhoid in the right anterior position.  Attention was directed at the largest bundle first which was the right posterior.  Margins of excision were marked around the internal component.  This was then incised with electrocautery and dissected free from the internal sphincter muscle.  The remainder of the hemorrhoid was removed using the hand-held LigaSure device for hemostasis purposes.  This was passed off the specimen. The apex was closed using a figure-of-eight 2-0 Vicryl suture.  The hemorrhoidal defect was then approximated using running locking 2-0 Vicryl suture.  Attention was then directed at the right anterior bundle.  Margins of excision were marked around the internal component.  This was then incised with electrocautery and dissected free from the internal sphincter muscle.  The remainder of the hemorrhoid was removed using the hand-held LigaSure device for hemostasis purposes.  This was passed off the specimen. The apex was closed using a figure-of-eight 2-0 Vicryl suture.  The hemorrhoidal defect was then approximated using running locking 2-0 Vicryl suture.  Attention was then directed at the left lateral component.  Margins of excision were marked around the internal component.  This was then incised with electrocautery and dissected free from the internal sphincter muscle.  The remainder of the hemorrhoid was removed using the hand-held LigaSure device for hemostasis purposes.  This was passed off the specimen.  The apex was closed using a figure-of-eight 2-0 Vicryl suture.  The hemorrhoidal defect was then approximated using running locking 2-0 Vicryl suture.  Externally there is an acutely thrombosed external hemorrhoid.  There is no other significant external hemorrhoidal  component.  This is incised with a 2 to 3 mm incision and the clot extruded.  This was then approximated using 3-0 chromic suture.  The anal canal is irrigated and hemostasis is verified.  All sponge, needle, instrument counts are reported correct.  Given his history of taking a blood thinner, we opted to place a piece of Surgifoam within the anal canal.  Topical Dibucaine ointment is applied.  The buttocks are untaped and a dressing consisting of 4 x 4's, ABD, and mesh underwear was placed.  He is rolled back onto a stretcher, awakened from anesthesia, extubated, and transferred to the recovery room in satisfactory condition.  DISPOSITION: PACU in satisfactory condition.

## 2021-10-13 NOTE — Discharge Instructions (Addendum)
ANORECTAL SURGERY: POST OP INSTRUCTIONS  DIET: Follow a light bland diet the first 24 hours after arrival home, such as soup, liquids, crackers, etc.  Be sure to include lots of fluids daily.  Avoid fast food or heavy meals as your are more likely to get nauseated.  Eat a low fat diet the next few days after surgery.   Some bleeding with bowel movements is expected for the first couple of days but this should stop in between bowel movements  Take your usually prescribed home medications unless otherwise directed. No foreign bodies per rectum for the next 3 months (enemas, etc)  PAIN CONTROL: It is helpful to take an over-the-counter pain medication regularly for the first few days/weeks.  Choose from the following that works best for you: Ibuprofen (Advil, etc) Three '200mg'$  tabs every 6 hours as needed. Acetaminophen (Tylenol, etc) 500-'650mg'$  every 6 hours as needed NOTE: You may take both of these medications together - most patients find it most helpful when alternating between the two (i.e. Ibuprofen at 6am, tylenol at 9am, ibuprofen at 12pm ..[name]) A  prescription for pain medication may have been prescribed for you at discharge.  Take your pain medication as prescribed.  If you are having problems/concerns with the prescription medicine, please call us for further advice. Avoid resuming your blood thinner (Eliquis/Xarelto) until you are 48 hrs out from surgery and having no significant bleeding.  Avoid getting constipated.  Between the surgery and the pain medications, it is common to experience some constipation.  Increasing fluid intake (64oz of water per day) and taking a fiber supplement (such as Metamucil, Citrucel, FiberCon) 1-2 times a day regularly will usually help prevent this problem from occurring.  Take Miralax (over the counter) 1-2x/day while taking a narcotic pain medication. If no bowel movement after 48hours, you may additionally take a laxative like a bottle of Milk of Magnesia  which can be purchased over the counter. Avoid enemas.   Watch out for diarrhea.  If you have many loose bowel movements, simplify your diet to bland foods.  Stop any stool softeners and decrease your fiber supplement. If this worsens or does not improve, please call us.  Wash / shower every day.  If you were discharged with a dressing, you may remove this the day after your surgery. You may shower normally, getting soap/water on your wound, particularly after bowel movements.  Soaking in a warm bath filled a couple inches ("Sitz bath") is a great way to clean the area after a bowel movement and many patients find it is a way to soothe the area.  ACTIVITIES as tolerated:   You may resume regular (light) daily activities beginning the next day--such as daily self-care, walking, climbing stairs--gradually increasing activities as tolerated.  If you can walk 30 minutes without difficulty, it is safe to try more intense activity such as jogging, treadmill, bicycling, low-impact aerobics, etc. Refrain from any heavy lifting or straining for the first 2 weeks after your procedure, particularly if your surgery was for hemorrhoids. Avoid activities that make your pain worse You may drive when you are no longer taking prescription pain medication, you can comfortably wear a seatbelt, and you can safely maneuver your car and apply brakes.  FOLLOW UP in our office Please call [facility] at [phone] to set up an appointment to see your surgeon in the office for a follow-up appointment approximately 2 weeks after your surgery. Make sure that you call for this appointment the day  you arrive home to insure a convenient appointment time.  9. If you have disability or family leave forms that need to be completed, you may have them completed by your primary care physician's office; for return to work instructions, please ask our office staff and they will be happy to assist you in obtaining this documentation    When to call us [phone]: Poor pain control Reactions / problems with new medications (rash/itching, etc)  Fever over 101.5 F (38.5 C) Inability to urinate Nausea/vomiting Worsening swelling or bruising Continued bleeding from incision. Increased pain, redness, or drainage from the incision  The clinic staff is available to answer your questions during regular business hours (8:30am-5pm).  Please don't hesitate to call and ask to speak to one of our nurses for clinical concerns.   A surgeon from [facility] is always on call at the hospitals   If you have a medical emergency, go to the nearest emergency room or call 911.   [facility] [address], [place], [place], [place]  [address] MAIN: [phone] FAX: [phone] [email or web address]    Post Anesthesia Home Care Instructions  Activity: Get plenty of rest for the remainder of the day. A responsible individual must stay with you for 24 hours following the procedure.  For the next 24 hours, DO NOT: -Drive a car -Paediatric nurse -Drink alcoholic beverages -Take any medication unless instructed by your physician -Make any legal decisions or sign important papers.  Meals: Start with liquid foods such as gelatin or soup. Progress to regular foods as tolerated. Avoid greasy, spicy, heavy foods. If nausea and/or vomiting occur, drink only clear liquids until the nausea and/or vomiting subsides. Call your physician if vomiting continues.  Special Instructions/Symptoms: Your throat may feel dry or sore from the anesthesia or the breathing tube placed in your throat during surgery. If this causes discomfort, gargle with warm salt water. The discomfort should disappear within 24 hours.  Information for Discharge Teaching: EXPAREL (bupivacaine liposome injectable suspension)   Your surgeon or anesthesiologist gave you EXPAREL(bupivacaine) to help  control your pain after surgery.  EXPAREL is a local anesthetic that provides pain relief by numbing the tissue around the surgical site. EXPAREL is designed to release pain medication over time and can control pain for up to 72 hours. Depending on how you respond to EXPAREL, you may require less pain medication during your recovery.  Possible side effects: Temporary loss of sensation or ability to move in the area where bupivacaine was injected. Nausea, vomiting, constipation Rarely, numbness and tingling in your mouth or lips, lightheadedness, or anxiety may occur. Call your doctor right away if you think you may be experiencing any of these sensations, or if you have other questions regarding possible side effects.  Follow all other discharge instructions given to you by your surgeon or nurse. Eat a healthy diet and drink plenty of water or other fluids.  If you return to the hospital for any reason within 96 hours following the administration of EXPAREL, it is important for health care providers to know that you have received this anesthetic. A teal colored band has been placed on your arm with the date, time and amount of EXPAREL you have received in order to alert and inform your health care providers. Please leave this armband in place for the full 96 hours following administration, and then you may remove the band.

## 2021-10-15 NOTE — Anesthesia Postprocedure Evaluation (Signed)
Anesthesia Post Note  Patient: Erik [name]  Procedure(s) Performed: HEMORRHOIDECTOMY 2 vs 3 COLUMN, DISIMPACTION (Rectum) ANORECTAL EXAM UNDER ANESTHESIA (Rectum)     Patient location during evaluation: PACU Anesthesia Type: General Level of consciousness: awake Pain management: pain level controlled Vital Signs Assessment: post-procedure vital signs reviewed and stable Respiratory status: spontaneous breathing, nonlabored ventilation, respiratory function stable and patient connected to nasal cannula oxygen Cardiovascular status: blood pressure returned to baseline and stable Postop Assessment: no apparent nausea or vomiting Anesthetic complications: no   No notable events documented.  Last Vitals:  Vitals:   10/13/21 1600 10/13/21 1700  BP: 129/65 136/72  Pulse: (!) 59 66  Resp: 10 15  Temp: (!) 36.3 C (!) 36.4 C  SpO2: 94% 94%    Last Pain:  Vitals:   10/13/21 1700  TempSrc:   PainSc: 0-No pain                 Jarrius Huaracha P Seann Genther

## 2021-10-16 ENCOUNTER — Encounter (HOSPITAL_BASED_OUTPATIENT_CLINIC_OR_DEPARTMENT_OTHER): Payer: Self-pay | Admitting: Surgery

## 2021-10-16 LAB — SURGICAL PATHOLOGY

## 2021-10-16 NOTE — Progress Notes (Signed)
Remote pacemaker transmission.   

## 2021-10-18 ENCOUNTER — Encounter (HOSPITAL_COMMUNITY): Payer: Self-pay

## 2021-10-18 ENCOUNTER — Observation Stay (HOSPITAL_COMMUNITY)
Admission: EM | Admit: 2021-10-18 | Discharge: 2021-10-19 | Disposition: A | Discharge status: Home / Self Care | Payer: Medicare HMO | Attending: Internal Medicine | Admitting: Internal Medicine

## 2021-10-18 DIAGNOSIS — E039 Hypothyroidism, unspecified: Secondary | ICD-10-CM | POA: Diagnosis not present

## 2021-10-18 DIAGNOSIS — Z7901 Long term (current) use of anticoagulants: Secondary | ICD-10-CM | POA: Insufficient documentation

## 2021-10-18 DIAGNOSIS — Z950 Presence of cardiac pacemaker: Secondary | ICD-10-CM | POA: Insufficient documentation

## 2021-10-18 DIAGNOSIS — E785 Hyperlipidemia, unspecified: Secondary | ICD-10-CM | POA: Diagnosis present

## 2021-10-18 DIAGNOSIS — I10 Essential (primary) hypertension: Secondary | ICD-10-CM | POA: Diagnosis present

## 2021-10-18 DIAGNOSIS — N1831 Chronic kidney disease, stage 3a: Secondary | ICD-10-CM | POA: Insufficient documentation

## 2021-10-18 DIAGNOSIS — Z85828 Personal history of other malignant neoplasm of skin: Secondary | ICD-10-CM | POA: Diagnosis not present

## 2021-10-18 DIAGNOSIS — Z79899 Other long term (current) drug therapy: Secondary | ICD-10-CM | POA: Diagnosis not present

## 2021-10-18 DIAGNOSIS — Z992 Dependence on renal dialysis: Secondary | ICD-10-CM | POA: Insufficient documentation

## 2021-10-18 DIAGNOSIS — Z96642 Presence of left artificial hip joint: Secondary | ICD-10-CM | POA: Diagnosis not present

## 2021-10-18 DIAGNOSIS — I2510 Atherosclerotic heart disease of native coronary artery without angina pectoris: Secondary | ICD-10-CM | POA: Diagnosis not present

## 2021-10-18 DIAGNOSIS — I4819 Other persistent atrial fibrillation: Secondary | ICD-10-CM | POA: Diagnosis not present

## 2021-10-18 DIAGNOSIS — I129 Hypertensive chronic kidney disease with stage 1 through stage 4 chronic kidney disease, or unspecified chronic kidney disease: Secondary | ICD-10-CM | POA: Insufficient documentation

## 2021-10-18 DIAGNOSIS — R58 Hemorrhage, not elsewhere classified: Secondary | ICD-10-CM | POA: Diagnosis not present

## 2021-10-18 DIAGNOSIS — K625 Hemorrhage of anus and rectum: Secondary | ICD-10-CM | POA: Diagnosis not present

## 2021-10-18 DIAGNOSIS — Z87891 Personal history of nicotine dependence: Secondary | ICD-10-CM | POA: Diagnosis not present

## 2021-10-18 DIAGNOSIS — N401 Enlarged prostate with lower urinary tract symptoms: Secondary | ICD-10-CM | POA: Diagnosis present

## 2021-10-18 DIAGNOSIS — G629 Polyneuropathy, unspecified: Secondary | ICD-10-CM

## 2021-10-18 DIAGNOSIS — N183 Chronic kidney disease, stage 3 unspecified: Secondary | ICD-10-CM | POA: Diagnosis present

## 2021-10-18 DIAGNOSIS — E669 Obesity, unspecified: Secondary | ICD-10-CM | POA: Insufficient documentation

## 2021-10-18 DIAGNOSIS — N138 Other obstructive and reflux uropathy: Secondary | ICD-10-CM | POA: Diagnosis present

## 2021-10-18 DIAGNOSIS — K921 Melena: Secondary | ICD-10-CM | POA: Diagnosis present

## 2021-10-18 LAB — COMPREHENSIVE METABOLIC PANEL
ALT: 20 U/L (ref 0–44)
AST: 18 U/L (ref 15–41)
Albumin: 3.8 g/dL (ref 3.5–5.0)
Alkaline Phosphatase: 43 U/L (ref 38–126)
Anion gap: 11 (ref 5–15)
BUN: 22 mg/dL (ref 8–23)
CO2: 26 mmol/L (ref 22–32)
Calcium: 9.2 mg/dL (ref 8.9–10.3)
Chloride: 104 mmol/L (ref 98–111)
Creatinine, Ser: 1.4 mg/dL — ABNORMAL HIGH (ref 0.61–1.24)
GFR, Estimated: 53 mL/min — ABNORMAL LOW (ref >60)
Glucose, Bld: 94 mg/dL (ref 70–99)
Potassium: 3.9 mmol/L (ref 3.5–5.1)
Sodium: 141 mmol/L (ref 135–145)
Total Bilirubin: 1.3 mg/dL — ABNORMAL HIGH (ref 0.3–1.2)
Total Protein: 7 g/dL (ref 6.5–8.1)

## 2021-10-18 LAB — CBC
HCT: 42.2 % (ref 39.0–52.0)
HCT: 39.3 % (ref 39.0–52.0)
Hemoglobin: 14.1 g/dL (ref 13.0–17.0)
Hemoglobin: 12.7 g/dL — ABNORMAL LOW (ref 13.0–17.0)
MCH: 30.1 pg (ref 26.0–34.0)
MCH: 29.1 pg (ref 26.0–34.0)
MCHC: 33.4 g/dL (ref 30.0–36.0)
MCHC: 32.3 g/dL (ref 30.0–36.0)
MCV: 90 fL (ref 80.0–100.0)
MCV: 90.1 fL (ref 80.0–100.0)
Platelets: 183 10*3/uL (ref 150–400)
Platelets: 192 10*3/uL (ref 150–400)
RBC: 4.69 MIL/uL (ref 4.22–5.81)
RBC: 4.36 MIL/uL (ref 4.22–5.81)
RDW: 13.8 % (ref 11.5–15.5)
RDW: 13.9 % (ref 11.5–15.5)
WBC: 10.2 10*3/uL (ref 4.0–10.5)
WBC: 8.2 10*3/uL (ref 4.0–10.5)
nRBC: 0 % (ref 0.0–0.2)
nRBC: 0 % (ref 0.0–0.2)

## 2021-10-18 LAB — TYPE AND SCREEN
ABO/RH(D): O POS
Antibody Screen: NEGATIVE

## 2021-10-18 LAB — PROTIME-INR
INR: 1.5 — ABNORMAL HIGH (ref 0.8–1.2)
Prothrombin Time: 17.6 seconds — ABNORMAL HIGH (ref 11.4–15.2)

## 2021-10-18 MED ORDER — PANTOPRAZOLE SODIUM 40 MG PO TBEC
40.0000 mg | DELAYED_RELEASE_TABLET | Freq: Every day | ORAL | Status: DC
  Administered 2021-10-18 – 2021-10-19 (×2): 40 mg via ORAL
  Filled 2021-10-18 (×2): qty 1

## 2021-10-18 MED ORDER — OXYCODONE HCL 5 MG PO TABS
5.0000 mg | ORAL_TABLET | Freq: Three times a day (TID) | ORAL | Status: DC | PRN
  Administered 2021-10-18 – 2021-10-19 (×2): 5 mg via ORAL
  Filled 2021-10-18 (×2): qty 1

## 2021-10-18 MED ORDER — HYDROMORPHONE HCL 2 MG/ML IJ SOLN
1.0000 mg | Freq: Once | INTRAMUSCULAR | Status: AC
  Administered 2021-10-18: 1 mg via INTRAVENOUS
  Filled 2021-10-18: qty 1

## 2021-10-18 MED ORDER — PROCHLORPERAZINE EDISYLATE 10 MG/2ML IJ SOLN: 10.0000 mg | Freq: Four times a day (QID) | INTRAMUSCULAR | Status: DC | PRN

## 2021-10-18 MED ORDER — GABAPENTIN 300 MG PO CAPS
600.0000 mg | ORAL_CAPSULE | Freq: Three times a day (TID) | ORAL | Status: DC
  Administered 2021-10-18 – 2021-10-19 (×3): 600 mg via ORAL
  Filled 2021-10-18 (×3): qty 2

## 2021-10-18 MED ORDER — PSYLLIUM 95 % PO PACK
1.0000 | PACK | Freq: Two times a day (BID) | ORAL | Status: DC
  Administered 2021-10-18 – 2021-10-19 (×2): 1 via ORAL
  Filled 2021-10-18 (×3): qty 1

## 2021-10-18 MED ORDER — SODIUM CHLORIDE 0.9 % IV SOLN
INTRAVENOUS | Status: DC
Start: 2021-10-18 — End: 2021-10-19

## 2021-10-18 MED ORDER — PREDNISONE 20 MG PO TABS
10.0000 mg | ORAL_TABLET | Freq: Every day | ORAL | Status: DC
  Administered 2021-10-19: 10 mg via ORAL
  Filled 2021-10-18: qty 1

## 2021-10-18 MED ORDER — ACETAMINOPHEN 325 MG PO TABS: 650.0000 mg | ORAL_TABLET | Freq: Four times a day (QID) | ORAL | Status: DC | PRN

## 2021-10-18 MED ORDER — SIMVASTATIN 20 MG PO TABS
20.0000 mg | ORAL_TABLET | Freq: Every day | ORAL | Status: DC
  Administered 2021-10-18: 20 mg via ORAL
  Filled 2021-10-18: qty 1

## 2021-10-18 MED ORDER — POLYETHYLENE GLYCOL 3350 17 G PO PACK
17.0000 g | PACK | Freq: Two times a day (BID) | ORAL | Status: DC
Start: 2021-10-18 — End: 2021-10-19
  Administered 2021-10-19: 17 g via ORAL
  Filled 2021-10-18 (×2): qty 1

## 2021-10-18 MED ORDER — SENNOSIDES-DOCUSATE SODIUM 8.6-50 MG PO TABS
1.0000 | ORAL_TABLET | Freq: Two times a day (BID) | ORAL | Status: DC
  Administered 2021-10-18 – 2021-10-19 (×2): 1 via ORAL
  Filled 2021-10-18 (×2): qty 1

## 2021-10-18 MED ORDER — GABAPENTIN 600 MG PO TABS
600.0000 mg | ORAL_TABLET | Freq: Three times a day (TID) | ORAL | Status: DC
  Filled 2021-10-18: qty 1

## 2021-10-18 MED ORDER — TAMSULOSIN HCL 0.4 MG PO CAPS
0.8000 mg | ORAL_CAPSULE | Freq: Every day | ORAL | Status: DC
  Administered 2021-10-18 – 2021-10-19 (×2): 0.8 mg via ORAL
  Filled 2021-10-18 (×2): qty 2

## 2021-10-18 MED ORDER — FAMOTIDINE 20 MG PO TABS: 20.0000 mg | ORAL_TABLET | Freq: Every day | ORAL | Status: DC | PRN

## 2021-10-18 MED ORDER — ACETAMINOPHEN 650 MG RE SUPP: 650.0000 mg | Freq: Four times a day (QID) | RECTAL | Status: DC | PRN

## 2021-10-18 MED ORDER — PROCHLORPERAZINE EDISYLATE 10 MG/2ML IJ SOLN
10.0000 mg | Freq: Once | INTRAMUSCULAR | Status: DC
  Filled 2021-10-18: qty 2

## 2021-10-18 MED ORDER — LEVOTHYROXINE SODIUM 50 MCG PO TABS
50.0000 ug | ORAL_TABLET | Freq: Every day | ORAL | Status: DC
  Administered 2021-10-19: 50 ug via ORAL
  Filled 2021-10-18: qty 1

## 2021-10-18 MED ORDER — DILTIAZEM HCL ER COATED BEADS 120 MG PO CP24
120.0000 mg | ORAL_CAPSULE | Freq: Every day | ORAL | Status: DC
  Administered 2021-10-19: 120 mg via ORAL

## 2021-10-18 MED ORDER — MORPHINE SULFATE (PF) 2 MG/ML IV SOLN
2.0000 mg | Freq: Once | INTRAVENOUS | Status: AC
  Administered 2021-10-18: 2 mg via INTRAVENOUS
  Filled 2021-10-18: qty 1

## 2021-10-18 MED ORDER — FUROSEMIDE 40 MG PO TABS
40.0000 mg | ORAL_TABLET | Freq: Every evening | ORAL | Status: DC
  Administered 2021-10-18: 40 mg via ORAL
  Filled 2021-10-18: qty 1

## 2021-10-18 NOTE — ED Notes (Signed)
Pt placed into inpatient hospital bed for comfort

## 2021-10-18 NOTE — ED Provider Notes (Signed)
[facility] DEPT Provider Note   CSN: [id number] Arrival date & time: 10/18/21  0747     History Chief Complaint  Patient presents with  . Hematochezia    HPI Erik [name] is a 76 y.o. male presenting for bright red blood per rectum.  Patient has a recent history of internal hemorrhoids status post surgical intervention 5 days ago.  He has had extensive bleeding per rectum and severe pain since the procedure.  He denies fevers or chills nausea or vomiting, syncope or shortness of breath.  Patient denies any lightheadedness at this time.  Pain is all localized around his rectum. Patient states he has been taking oxycodone without symptomatic improvement. Patient's recorded medical, surgical, social, medication list and allergies were reviewed in the Snapshot window as part of the initial history.   Review of Systems   Review of Systems  Constitutional:  Negative for chills and fever.  HENT:  Negative for ear pain and sore throat.   Eyes:  Negative for pain and visual disturbance.  Respiratory:  Negative for cough and shortness of breath.   Cardiovascular:  Negative for chest pain and palpitations.  Gastrointestinal:  Positive for anal bleeding and blood in stool. Negative for abdominal pain and vomiting.  Genitourinary:  Negative for dysuria and hematuria.  Musculoskeletal:  Negative for arthralgias and back pain.  Skin:  Negative for color change and rash.  Neurological:  Negative for seizures and syncope.  All other systems reviewed and are negative.   Physical Exam Updated Vital Signs BP (!) 141/69 (BP Location: Left Arm)   Pulse 63   Temp 97.7 F (36.5 C) (Oral)   Resp 20   SpO2 96%  Physical Exam Vitals and nursing note reviewed.  Constitutional:      General: He is not in acute distress.    Appearance: He is well-developed.  HENT:     Head: Normocephalic and atraumatic.  Eyes:     Conjunctiva/sclera: Conjunctivae normal.   Cardiovascular:     Rate and Rhythm: Normal rate and regular rhythm.     Heart sounds: No murmur heard. Pulmonary:     Effort: Pulmonary effort is normal. No respiratory distress.     Breath sounds: Normal breath sounds.  Abdominal:     Palpations: Abdomen is soft.     Tenderness: There is no abdominal tenderness.  Genitourinary:    Comments: Substantial blood in patient's perennial region. Musculoskeletal:        General: No swelling.     Cervical back: Neck supple.  Skin:    General: Skin is warm and dry.     Capillary Refill: Capillary refill takes less than 2 seconds.  Neurological:     Mental Status: He is alert.  Psychiatric:        Mood and Affect: Mood normal.     ED Course/ Medical Decision Making/ A&P    Procedures Procedures   Medications Ordered in ED Medications  morphine (PF) 2 MG/ML injection 2 mg (has no administration in time range)    Medical Decision Making:    Erik [name] is a 76 y.o. male who presented to the ED today with bright red bleeding per rectum and status of recent operative procedure detailed above.     Handoff received from EMS.  External chart has been reviewed including recent operative notes. Patient placed on continuous vitals and telemetry monitoring while in ED which was reviewed periodically.   Complete initial physical exam performed,  notably the patient  was hemodynamically stable in no acute distress.  He has obvious bright red blood in his proximal rectum.      Reviewed and confirmed nursing documentation for past medical history, family history, social history.    Initial Assessment:   Patient's history of present illness and physical exam findings most consistent with postoperative bleeding secondary to reinitiation of Xarelto.  Also consider diverticular bleed, upper GI bleed.  However they seem substantially less likely based on physical exam findings. This is most consistent with an acute life/limb threatening  illness complicated by underlying chronic conditions.  Initial Plan:  Type and screen in preparation for transfusion if needed Coagulation profile Consultation with general surgery for ongoing care and management recommendations Objective evaluation as below reviewed with plan for close reassessment  Initial Study Results:   Laboratory  All laboratory results reviewed without evidence of clinically relevant pathology.   Consults:  Case discussed with general surgery Dr. Dema Severin.  He evaluated at bedside and recommended admission for anticoagulation washout, daily reevaluations.  Need for intervention for hemostasis.   Final Assessment and Plan:   Patient arranged for admission.  Discussed case with hospitalist team who excepted patient in admission.  Patient admitted ready to move at this time.   Clinical Impression: No diagnosis found.   Data Unavailable   Final Clinical Impression(s) / ED Diagnoses Final diagnoses:  None    Rx / DC Orders ED Discharge Orders     None         Tretha Sciara, MD 10/18/21 1500

## 2021-10-18 NOTE — H&P (Signed)
History and Physical    Patient: Erik [name]:[id number] DOB: 12-03-1945 DOA: 10/18/2021 DOS: the patient was seen and examined on 10/18/2021 PCP: [name], MD  Patient coming from: Home  Chief Complaint:  Chief Complaint  Patient presents with   Hematochezia   HPI: Erik [name] is a 76 y.o. male with medical history significant of anxiety, osteoarthritis, BPH, basal cell skin cancer, atypical chest pain, paroxysmal atrial flutter/atrial fibrillation on Xarelto, tachybradycardia syndrome, pacemaker placement nonobstructive CAD, bilateral carotid disease, chronic lower back pain, stage 3 CKD, diverticulosis/diverticulitis, dyspnea on exertion, gout, GERD, early dialysis, colon polyps, hyperlipidemia, hypertension, hypothyroidism, long QT interval, neuropathy of the feet, prolapsed internal hemorrhoids who underwent hemorrhoidectomy with Dr. Dema Severin 5 days ago and is coming to the emergency department due to lower abdominal pain associated with postprocedural rectal bleeding started Saturday, became a lot worse since Monday after he resumed rivaroxaban.  He has been constipated for 6 days.  No nausea, vomiting, diarrhea or melena.  He denied fever, chills, rhinorrhea, sore throat, wheezing or hemoptysis.  No chest pain, palpitations, diaphoresis, PND, orthopnea or pitting edema of the lower extremities.  No flank pain, dysuria, frequency or hematuria.  No polyuria, polydipsia, polyphagia or blurred vision.   ED course: Initial vital signs were temperature 97.7 F, pulse 63, respiration 20, BP 141/69 mmHg and O2 sat 96% on room air.  The patient received 2 mg of morphine IVP.  Lab work: His CBC is her white count of 10.2, hemoglobin 14.1 g/dL platelets 183.  PT was 17.6 and INR 1.5.  CMP shows a creatinine 1.40 and total bilirubin 1.3 mg/dL.  The rest of the CMP was normal.   Review of Systems: As mentioned in the history of present illness. All other systems reviewed and are  negative. Past Medical History:  Diagnosis Date   Anxiety    Arthritis    Atypical chest pain 06/26/2015   BPH with urinary obstruction    Cancer (HCC)BASAL CELL SKIN CANCER    Carotid disease, bilateral ([place])    a. 09/2014 Carotid U/S: 1-39% bilat ICA stenosis.   Chronic lower back pain    CKD (chronic kidney disease), stage III ([place])    pt. Erik [name] denies   Diverticulitis 12/2016   Diverticulosis    Dyspnea    WITH EXERTION   GERD (gastroesophageal reflux disease)    Gout attack    LAST FLARE 08-31-2021 ALL SYMPTOMS RESOLVED   History of kidney stones    Hx of adenomatous colonic polyps 07/2020   2 diminutive   Hyperlipidemia    Hypertension    Hypothyroidism    Internal hemorrhoids    Long QT interval 06/26/2015   Nephrolithiasis    Neuropathy 07/30/2017   TOES ON BOTH FEET   Non-obstructive CAD    a. 07/2002 Cath: LM 20, LAD 24md, LCX 50-65mOM1 4027mCA 44m51m 60%; b. 05/2014 MV: low risk w/ small sized, mild intensity rev defect in apical/inferior/infsept area, nl EF->Med Rx.   Panic attack    NONE RECENT PER WIFE 10-09-2021   Paroxysmal atrial fibrillation (HCC)[place] a. Dx 04/2014; b. 05/2014 Echo: Ef 60-65%, no rwma, triv MR/TR, nl RV;  c. CHA2DS2VASc = 3-->was on eliquis, switched to xarelto 04/2015 2/2 cost; d. 05/2015 Tikosyn loaded w/ conversion to AF; e. 06/2015 QTc prolongation and bradycardia->tikosyn d/c'd, PPM placed, Amio started; f. 07/01/2015 In Aflutter @ clinic f/u.   Paroxysmal atrial flutter (HCC)[place] a.  06/2015 noted to be in rapid Aflutter in Afib clinic-->amio load continued.   Persistent atrial fibrillation ([place]) 06/21/2015   Presence of permanent cardiac pacemaker    [employer]   Prolapsed internal hemorrhoids, grade 2-3 10/19/2016   LL Gr 3 and RP/RA Gr 2 LL banded 10/19/2016    RLL pneumonia 11/05/2012   Sleep apnea    "can't tolerate mask" (05/17/2017)   Tachy-brady syndrome ([place])    a. 06/27/2015 s/p SJM DC PPM (ser # @ [id number]).   Past Surgical History:   Procedure Laterality Date   ATRIAL FIBRILLATION ABLATION N/A 05/17/2017   Procedure: ATRIAL FIBRILLATION ABLATION;  Surgeon: Constance Haw, MD;  Location: [facility];  Service: Cardiovascular;  Laterality: N/A;   CARDIAC CATHETERIZATION  ~ 2000   CLOSED REDUCTION HAND FRACTURE Right 1984   COLONOSCOPY  06/27/2015   per Dr. Henrene Pastor, internal hemorrhoids and diverticulae, repeat 10 yrs   COLONSCOPY     FEB OR MARCH 2023 AT DR Carlean Purl OFFICE   CYSTOSCOPY WITH RETROGRADE PYELOGRAM, URETEROSCOPY AND STENT PLACEMENT Bilateral 02/14/2018   Procedure: CYSTOSCOPY WITH RETROGRADE PYELOGRAM, URETEROSCOPY AND STENT PLACEMENT;  Surgeon: Alexis Frock, MD;  Location: [facility];  Service: Urology;  Laterality: Bilateral;   EP IMPLANTABLE DEVICE N/A 06/27/2015   Procedure: Pacemaker Implant;  Surgeon: Evans Lance, MD;  Location: [facility];  Service: Cardiovascular;  Laterality: N/A;   EYE SURGERY  08/03/2019   Cataract   EYE SURGERY  08/17/2019   Cataact   FRACTURE SURGERY     right hand,finger YRS AGO   HEMORRHOID BANDING     DONE SEVERAL TIMES LAST 4 TO 5 YRS AGOPER WIFE 10-09-2021   HEMORRHOID SURGERY N/A 10/13/2021   Procedure: HEMORRHOIDECTOMY 2 vs 3 COLUMN, DISIMPACTION;  Surgeon: Ileana Roup, MD;  Location: [place];  Service: General;  Laterality: N/A;   HOLMIUM LASER APPLICATION Bilateral 34/19/6222   Procedure: HOLMIUM LASER APPLICATION;  Surgeon: Alexis Frock, MD;  Location: [facility];  Service: Urology;  Laterality: Bilateral;   LACERATION REPAIR Right 1984   "hand"   POSTERIOR LUMBAR FUSION  1998   2 lumbar discs, Dr. Ellene Route; "ray cages"   RECTAL EXAM UNDER ANESTHESIA N/A 10/13/2021   Procedure: ANORECTAL EXAM UNDER ANESTHESIA;  Surgeon: Ileana Roup, MD;  Location: [place];  Service: General;  Laterality: N/A;   SKIN CANCER EXCISION  09/2019   TOTAL HIP ARTHROPLASTY Left 09/01/2019   Procedure: LEFT TOTAL HIP  ARTHROPLASTY ANTERIOR APPROACH;  Surgeon: Melrose Nakayama, MD;  Location: [facility];  Service: Orthopedics;  Laterality: Left;   Social History:  reports that he quit smoking about 26 years ago. His smoking use included cigarettes. He has a 56.00 pack-year smoking history. He has never used smokeless tobacco. He reports current alcohol use of about 1.0 standard drink of alcohol per week. He reports that he does not use drugs.  No Known Allergies  Family History  Problem Relation Age of Onset   Dementia Mother    Stroke Father    Diabetes Paternal Grandmother    Stroke Paternal Aunt        x 2   Cancer Paternal Aunt        type unknown   Colon cancer Neg Hx    Stomach cancer Neg Hx     Prior to Admission medications   Medication Sig Start Date End Date Taking? Authorizing Provider  acetaminophen (TYLENOL) 500 MG tablet Take 1,000  mg by mouth every 6 (six) hours as needed for moderate pain.    Yes [provider]  diltiazem (CARDIZEM CD) 120 MG 24 hr capsule TAKE 1 CAPSULE(120 MG) BY MOUTH DAILY Patient taking differently: 120 mg daily. 08/02/21  Yes [name], MD  famotidine (PEPCID) 20 MG tablet Take 20 mg by mouth daily as needed (acid reflux).   Yes [provider]  furosemide (LASIX) 40 MG tablet TAKE 1 TABLET(40 MG) BY MOUTH DAILY Patient taking differently: every evening. TAKE 1 TABLET(40 MG) BY MOUTH DAILY 01/06/21  Yes [name], MD  gabapentin (NEURONTIN) 600 MG tablet TAKE 1 TABLET(600 MG) BY MOUTH THREE TIMES DAILY Patient taking differently: Take 600 mg by mouth 3 (three) times daily. 09/26/21  Yes [name], MD  levothyroxine (SYNTHROID) 50 MCG tablet TAKE 1 TABLET BY MOUTH EVERY DAY Patient taking differently: Take 50 mcg by mouth daily before breakfast. 01/06/21  Yes [name], MD  losartan (COZAAR) 50 MG tablet Take 1 tablet (50 mg total) by mouth daily. 01/27/21  Yes [name], MD  Multiple Vitamin (MULTIVITAMIN) capsule Take 1  capsule by mouth daily.   Yes [provider]  oxyCODONE (ROXICODONE) 5 MG immediate release tablet Take 1 tablet (5 mg total) by mouth every 8 (eight) hours as needed for up to 5 days. 10/13/21 10/18/21 Yes Ileana Roup, MD  potassium chloride (KLOR-CON) 10 MEQ tablet TAKE 1 TABLET(10 MEQ) BY MOUTH DAILY Patient taking differently: Take 10 mEq by mouth daily. 09/01/21  Yes Camnitz, Will Hassell Done, MD  predniSONE (DELTASONE) 10 MG tablet Take 1 tablet (10 mg total) by mouth daily with breakfast. 01/27/21  Yes [name], MD  psyllium (METAMUCIL) 58.6 % powder Take 1 packet by mouth daily. With water or juice   Yes [provider]  simvastatin (ZOCOR) 20 MG tablet TAKE 1 TABLET(20 MG) BY MOUTH DAILY AT 6 PM Patient taking differently: Take 20 mg by mouth daily at 6 PM. 09/01/21  Yes Camnitz, Ocie Doyne, MD  tamsulosin (FLOMAX) 0.4 MG CAPS capsule Take 2 capsules (0.8 mg total) by mouth daily. 08/21/21  Yes [name], MD  triamcinolone cream (KENALOG) 0.1 % Apply 1 application. topically 3 (three) times daily. Patient taking differently: Apply 1 application  topically daily as needed (itching, rash). 06/21/21  Yes [name], MD  XARELTO 20 MG TABS tablet TAKE 1 TABLET(20 MG) BY MOUTH DAILY WITH SUPPER Patient taking differently: Take 20 mg by mouth daily with supper. 07/05/21  Yes Lorretta Harp, MD    Physical Exam: Vitals:   10/18/21 0915 10/18/21 0930 10/18/21 0945 10/18/21 1000  BP: 124/72 (!) 133/55 (!) 138/90 (!) 158/133  Pulse: 74 63 62 95  Resp: '17 12 16 16  '$ Temp:      TempSrc:      SpO2: 96% 96% 98% 95%   Physical Exam Vitals and nursing note reviewed.  Constitutional:      Appearance: Normal appearance. He is obese.  HENT:     Head: Normocephalic.     Nose: Nose normal.  Eyes:     General: No scleral icterus.    Pupils: Pupils are equal, round, and reactive to light.  Neck:     Vascular: No JVD.  Cardiovascular:     Rate and Rhythm: Normal  rate and regular rhythm.     Heart sounds: S1 normal and S2 normal.  Pulmonary:     Effort: Pulmonary effort  is normal.     Breath sounds: Normal breath sounds.  Abdominal:     General: Bowel sounds are normal. There is no distension.     Palpations: Abdomen is soft.  Musculoskeletal:     Cervical back: Neck supple.     Right lower leg: No edema.     Left lower leg: No edema.  Skin:    General: Skin is warm and dry.  Neurological:     General: No focal deficit present.     Mental Status: He is alert and oriented to person, place, and time.  Psychiatric:        Mood and Affect: Mood normal.        Behavior: Behavior normal.   Data Reviewed:  Results are pending, will review when available.  Assessment and Plan: Principal Problem:   Rectal bleeding Telemetry/observation. Diet as tolerated. Continue IV fluids. Hold Xarelto. Analgesics as needed. Avoid constipation. Monitor H&H. Transfuse as needed. General surgery consult appreciated.  Active Problems:   Hyperlipidemia Continue simvastatin 20 mg p.o. daily.    Essential hypertension Continue diltiazem 120 mg p.o. daily. Continue furosemide 40 mg p.o. in the evening.    Hypothyroidism Continue levothyroxine 50 mcg p.o. daily.    Persistent atrial fibrillation (HCC) CHA2DS2-VASc Score of at least 3. Holding rivaroxaban due to rectal bleeding. Patient made aware of increased CVA/TIA risk. Continue diltiazem for rate control.    CKD (chronic kidney disease), stage 3a (HCC) Monitor renal function electrolytes.    Neuropathy Continue gabapentin 600 mg p.o. 3 times daily.    BPH with urinary obstruction Continue tamsulosin 0.8 mg p.o. daily.     Advance Care Planning:   Code Status: Full Code   Consults: General surgery Nadeen Landau, MD).  Family Communication:   Severity of Illness: The appropriate patient status for this patient is OBSERVATION. Observation status is judged to be reasonable and  necessary in order to provide the required intensity of service to ensure the patient's safety. The patient's presenting symptoms, physical exam findings, and initial radiographic and laboratory data in the context of their medical condition is felt to place them at decreased risk for further clinical deterioration. Furthermore, it is anticipated that the patient will be medically stable for discharge from the hospital within 2 midnights of admission.   Author: Reubin Milan, MD 10/18/2021 11:57 AM  For on call review [email or web address].   This document was prepared using Dragon voice recognition software and may contain some unintended transcription errors.

## 2021-10-18 NOTE — Progress Notes (Signed)
  Subjective Well known to me. He is a 76 y.o. male with history of HTN, hypothyroidism, afib (on Xarelto, follows with Dr. Curt Bears), pacemaker - underwent 3 column hemorrhoidectomy 10/13/21 for medically refractory hemorrhoids and bleeding. Paused Xarelto perioperatively. Case was uneventful, noted to have rabbit pellet stool in rectal vault = constipation.  Resumed Xarelto on Monday and has had BRBPR since - mainly with Bms and expected pain with Bms. Had not had BM in 5 days until yesterday. Took Miralax q2hrs x 4. Now having bms that are semi formed. Some associated cramps with all this. No n/v. No fever/chills. Bleeding seems to have dramatically slowed and is no longer ongoing but associated with BM  Objective: Vital signs in last 24 hours: Temp:  [97.7 F (36.5 C)] 97.7 F (36.5 C) (08/23 0800) Pulse Rate:  [62-74] 62 (08/23 0945) Resp:  [10-20] 16 (08/23 0945) BP: (108-146)/(55-90) 138/90 (08/23 0945) SpO2:  [94 %-98 %] 98 % (08/23 0945)    Intake/Output from previous day: No intake/output data recorded. Intake/Output this shift: No intake/output data recorded.  Gen: NAD, comfortable, in good spirits Pulm: Normal work of breathing Abd: Soft, NT/ND Anorectal: Formed clot in gluteal fold, no evident active bleeding externally nor in the diaper he is wearing.   Ext: SCDs in place  Lab Results: CBC  Recent Labs    10/18/21 0815  WBC 10.2  HGB 14.1  HCT 42.2  PLT 183   BMET Recent Labs    10/18/21 0815  NA 141  K 3.9  CL 104  CO2 26  GLUCOSE 94  BUN 22  CREATININE 1.40*  CALCIUM 9.2   PT/INR Recent Labs    10/18/21 0815  LABPROT 17.6*  INR 1.5*   ABG No results for input(s): "PHART", "HCO3" in the last 72 hours.  Invalid input(s): "PCO2", "PO2"  Studies/Results:  Anti-infectives: Anti-infectives (From admission, onward)    None        Assessment/Plan: Patient Active Problem List   Diagnosis Date Noted   BPH with urinary obstruction  01/27/2021   Ankle edema, bilateral 11/13/2019   Primary osteoarthritis of left hip 09/01/2019   Herpes zoster without complication 29/47/6546   Primary osteoarthritis of right hip 10/24/2018   Neuropathy 07/30/2017   Prolapsed internal hemorrhoids, grade 2-3 10/19/2016   Constipation 09/29/2015   CKD (chronic kidney disease), stage III ([place])    Dizziness 06/26/2015   Long QT interval 06/26/2015   Visit for monitoring Tikosyn therapy 06/26/2015   Atypical chest pain 06/26/2015   Persistent atrial fibrillation ([facility]) 06/21/2015   Hypertensive heart disease    Hypothyroidism    Non-obstructive CAD    Carotid disease, bilateral ([place])    OBESITY 09/20/2008   Hyperlipidemia 12/17/2005   Essential hypertension 12/17/2005  PLAN  -Admit to hospitalist for ongoing monitoring, repeat hgb in AM. We will follow with you; certainly let us know if any questions/concerns too -MIVF -Diet as tolerated -Miralax BID; metamucil daily -Hold Xarelto if able for now - may take a day or two for a lot of this to subside. IF hgb remains stable and no longer constipated, would then consider resuming -Ambulate 5x/day; SCDs  Nadeen Landau, MD [facility], [place]

## 2021-10-18 NOTE — ED Triage Notes (Signed)
Pt arrives via EMS from home for bright red rectal bleeding. Pt was discharged on Friday after having an internal hemorrhoid surgery. Pt states that he started bleeding on Saturday. He restarted his Eliquis on Monday. Pt has had near constant bleeding since then. Pt endorses worsening DOE.

## 2021-10-19 DIAGNOSIS — I4819 Other persistent atrial fibrillation: Secondary | ICD-10-CM

## 2021-10-19 DIAGNOSIS — E785 Hyperlipidemia, unspecified: Secondary | ICD-10-CM

## 2021-10-19 DIAGNOSIS — I10 Essential (primary) hypertension: Secondary | ICD-10-CM | POA: Diagnosis not present

## 2021-10-19 DIAGNOSIS — K625 Hemorrhage of anus and rectum: Secondary | ICD-10-CM | POA: Diagnosis not present

## 2021-10-19 DIAGNOSIS — N1832 Chronic kidney disease, stage 3b: Secondary | ICD-10-CM | POA: Diagnosis not present

## 2021-10-19 LAB — COMPREHENSIVE METABOLIC PANEL
ALT: 16 U/L (ref 0–44)
AST: 15 U/L (ref 15–41)
Albumin: 3 g/dL — ABNORMAL LOW (ref 3.5–5.0)
Alkaline Phosphatase: 34 U/L — ABNORMAL LOW (ref 38–126)
Anion gap: 5 (ref 5–15)
BUN: 23 mg/dL (ref 8–23)
CO2: 27 mmol/L (ref 22–32)
Calcium: 7.9 mg/dL — ABNORMAL LOW (ref 8.9–10.3)
Chloride: 110 mmol/L (ref 98–111)
Creatinine, Ser: 1.47 mg/dL — ABNORMAL HIGH (ref 0.61–1.24)
GFR, Estimated: 50 mL/min — ABNORMAL LOW (ref >60)
Glucose, Bld: 91 mg/dL (ref 70–99)
Potassium: 3.4 mmol/L — ABNORMAL LOW (ref 3.5–5.1)
Sodium: 142 mmol/L (ref 135–145)
Total Bilirubin: 1 mg/dL (ref 0.3–1.2)
Total Protein: 5.6 g/dL — ABNORMAL LOW (ref 6.5–8.1)

## 2021-10-19 LAB — CBC
HCT: 37.5 % — ABNORMAL LOW (ref 39.0–52.0)
Hemoglobin: 12.2 g/dL — ABNORMAL LOW (ref 13.0–17.0)
MCH: 29.5 pg (ref 26.0–34.0)
MCHC: 32.5 g/dL (ref 30.0–36.0)
MCV: 90.8 fL (ref 80.0–100.0)
Platelets: 159 10*3/uL (ref 150–400)
RBC: 4.13 MIL/uL — ABNORMAL LOW (ref 4.22–5.81)
RDW: 14 % (ref 11.5–15.5)
WBC: 7.8 10*3/uL (ref 4.0–10.5)
nRBC: 0 % (ref 0.0–0.2)

## 2021-10-19 MED ORDER — RIVAROXABAN 20 MG PO TABS: 20.0000 mg | ORAL_TABLET | Freq: Every day | ORAL | Status: DC

## 2021-10-19 MED ORDER — POLYETHYLENE GLYCOL 3350 17 G PO PACK: 17.0000 g | PACK | Freq: Two times a day (BID) | ORAL | 0 refills | Status: AC

## 2021-10-19 MED ORDER — TRIAMCINOLONE ACETONIDE 0.1 % EX CREA: 1.0000 | TOPICAL_CREAM | Freq: Every day | CUTANEOUS | Status: AC | PRN

## 2021-10-19 NOTE — Discharge Summary (Signed)
Physician Discharge Summary  Erik [name] RAQ:[id number] DOB: 10-13-1945 DOA: 10/18/2021  PCP: [name], MD  Admit date: 10/18/2021 Discharge date: 10/19/2021  Admitted From: Home Disposition: Home  Recommendations for Outpatient Follow-up:  Follow up with PCP in 1 week with repeat CBC/BMP Outpatient follow-up with general surgery Follow up in ED if symptoms worsen or new appear   Home Health: No Equipment/Devices: None  Discharge Condition: Stable CODE STATUS: Full Diet recommendation: Heart healthy  Brief/Interim Summary: 76 year old male with history of anxiety, osteoarthritis, BPH, basal cell skin cancer, paroxysmal atrial flutter/atrial fibrillation on Xarelto, tachybradycardia syndrome status post pacemaker placement, nonobstructive CAD, bilateral carotid disease, chronic lower back pain, stage III CKD, diverticulosis/diverticulitis, gout, GERD, hyperlipidemia, hypertension, hypothyroidism, long QT interval, neuropathy of the feet, prolapsed internal hemorrhoids who underwent hemorrhoidectomy with Dr. Dema Severin 5 days prior to presentation presented with hematochezia.  On presentation, hemoglobin was 14.1.  General surgery was consulted who recommended conservative management with observation.  Patient was placed on MiraLAX twice a day.  Subsequently, hemoglobin has remained stable.  Bleeding has improved.  General surgery has cleared the patient for discharge.  He will be discharged home today with outpatient follow-up with PCP and general surgery.  Discharge Diagnoses:   Rectal bleeding in a patient with recent hemorrhoidectomy -Hemoglobin has remained stable during the hospitalization.  General surgery evaluated the patient and followed up and has cleared the patient for discharge and is recommended MiraLAX twice a day and Metamucil once a day. -Bleeding has improved. -Discharge home today.  Outpatient follow-up with PCP and general surgery -Resume Xarelto from 10/21/2021  onwards as per general surgery recommendations  Hyperlipidemia -Continue simvastatin  Essential hypertension -Blood pressure stable.  Continue diltiazem and Lasix  Hypothyroidism -Continue levothyroxine  Persistent A-fib -Rate controlled.  Continue diltiazem.  Xarelto plan as above  CKD stage IIIa -Stable.  Outpatient follow-up  Neuropathy -continue gabapentin  BPH -Continue Flomax  Obesity -Outpatient follow-up   Discharge Instructions  Discharge Instructions     Diet - low sodium heart healthy   Complete by: As directed    Increase activity slowly   Complete by: As directed       Allergies as of 10/19/2021   No Known Allergies      Medication List     STOP taking these medications    oxyCODONE 5 MG immediate release tablet Commonly known as: Roxicodone       TAKE these medications    acetaminophen 500 MG tablet Commonly known as: TYLENOL Take 1,000 mg by mouth every 6 (six) hours as needed for moderate pain.   diltiazem 120 MG 24 hr capsule Commonly known as: CARDIZEM CD TAKE 1 CAPSULE(120 MG) BY MOUTH DAILY What changed: See the new instructions.   famotidine 20 MG tablet Commonly known as: PEPCID Take 20 mg by mouth daily as needed (acid reflux).   furosemide 40 MG tablet Commonly known as: LASIX TAKE 1 TABLET(40 MG) BY MOUTH DAILY What changed: See the new instructions.   gabapentin 600 MG tablet Commonly known as: NEURONTIN TAKE 1 TABLET(600 MG) BY MOUTH THREE TIMES DAILY What changed: See the new instructions.   levothyroxine 50 MCG tablet Commonly known as: SYNTHROID TAKE 1 TABLET BY MOUTH EVERY DAY What changed: when to take this   losartan 50 MG tablet Commonly known as: COZAAR Take 1 tablet (50 mg total) by mouth daily.   multivitamin capsule Take 1 capsule by mouth daily.   polyethylene glycol 17 g packet Commonly  known as: MIRALAX / GLYCOLAX Take 17 g by mouth 2 (two) times daily.   potassium chloride 10 MEQ  tablet Commonly known as: KLOR-CON TAKE 1 TABLET(10 MEQ) BY MOUTH DAILY What changed: See the new instructions.   predniSONE 10 MG tablet Commonly known as: DELTASONE Take 1 tablet (10 mg total) by mouth daily with breakfast.   psyllium 58.6 % powder Commonly known as: METAMUCIL Take 1 packet by mouth daily. With water or juice   rivaroxaban 20 MG Tabs tablet Commonly known as: Xarelto Take 1 tablet (20 mg total) by mouth daily with supper. Restart from 10/21/21 as per general surgery Start taking on: October 21, 2021 What changed:  See the new instructions. These instructions start on October 21, 2021. If you are unsure what to do until then, ask your doctor or other care provider.   simvastatin 20 MG tablet Commonly known as: ZOCOR TAKE 1 TABLET(20 MG) BY MOUTH DAILY AT 6 PM What changed: See the new instructions.   tamsulosin 0.4 MG Caps capsule Commonly known as: FLOMAX Take 2 capsules (0.8 mg total) by mouth daily.   triamcinolone cream 0.1 % Commonly known as: KENALOG Apply 1 Application topically daily as needed (itching, rash).        Follow-up Information     [name], MD. Schedule an appointment as soon as possible for a visit in 1 week(s).   Specialty: Family Medicine Why: with repeat cbc/bmp Contact information: [place] [address] [phone]         Constance Haw, MD .   Specialty: Cardiology Contact information: [address] [place] [address] [phone]         Lorretta Harp, MD .   Specialties: Cardiology, Radiology Contact information: [address] [place] [address] [phone]         Ileana Roup, MD. Schedule an appointment as soon as possible for a visit in 1 week(s).   Specialties: General Surgery, Colon and Rectal Surgery Contact information: [place] [address] [phone]                No Known  Allergies  Consultations: General surgery   Procedures/Studies: No results found.    Subjective: Patient seen and examined at bedside.  Rectal bleeding is improving.  Tolerating diet.  Feels better and wants to go home today.  Denies worsening fever, vomiting, abdominal pain.  Discharge Exam: Vitals:   10/19/21 0558 10/19/21 0957  BP: (!) 140/75 (!) 150/82  Pulse: 65 82  Resp: 18 16  Temp: 98 F (36.7 C) 98 F (36.7 C)  SpO2: 98% 95%    General: Pt is alert, awake, not in acute distress.  Currently on room air. Cardiovascular: rate controlled, S1/S2 + Respiratory: bilateral decreased breath sounds at bases Abdominal: Soft, obese, NT, ND, bowel sounds + Extremities: no edema, no cyanosis    The results of significant diagnostics from this hospitalization (including imaging, microbiology, ancillary and laboratory) are listed below for reference.     Microbiology: No results found for this or any previous visit (from the past 240 hour(s)).   Labs: BNP (last 3 results) No results for input(s): "BNP" in the last 8760 hours. Basic Metabolic Panel: Recent Labs  Lab 10/13/21 1212 10/18/21 0815 10/19/21 0500  NA 144 141 142  K 3.7 3.9 3.4*  CL 105 104 110  CO2  --  26 27  GLUCOSE 107*  94 91  BUN '19 22 23  '$ CREATININE 1.40* 1.40* 1.47*  CALCIUM  --  9.2 7.9*   Liver Function Tests: Recent Labs  Lab 10/18/21 0815 10/19/21 0500  AST 18 15  ALT 20 16  ALKPHOS 43 34*  BILITOT 1.3* 1.0  PROT 7.0 5.6*  ALBUMIN 3.8 3.0*   No results for input(s): "LIPASE", "AMYLASE" in the last 168 hours. No results for input(s): "AMMONIA" in the last 168 hours. CBC: Recent Labs  Lab 10/13/21 1212 10/18/21 0815 10/18/21 2038 10/19/21 0500  WBC  --  10.2 8.2 7.8  HGB 13.6 14.1 12.7* 12.2*  HCT 40.0 42.2 39.3 37.5*  MCV  --  90.0 90.1 90.8  PLT  --  183 192 159   Cardiac Enzymes: No results for input(s): "CKTOTAL", "CKMB", "CKMBINDEX", "TROPONINI" in the last 168  hours. BNP: Invalid input(s): "POCBNP" CBG: No results for input(s): "GLUCAP" in the last 168 hours. D-Dimer No results for input(s): "DDIMER" in the last 72 hours. Hgb A1c No results for input(s): "HGBA1C" in the last 72 hours. Lipid Profile No results for input(s): "CHOL", "HDL", "LDLCALC", "TRIG", "CHOLHDL", "LDLDIRECT" in the last 72 hours. Thyroid function studies No results for input(s): "TSH", "T4TOTAL", "T3FREE", "THYROIDAB" in the last 72 hours.  Invalid input(s): "FREET3" Anemia work up No results for input(s): "VITAMINB12", "FOLATE", "FERRITIN", "TIBC", "IRON", "RETICCTPCT" in the last 72 hours. Urinalysis    Component Value Date/Time   COLORURINE YELLOW 08/21/2019 1420   APPEARANCEUR CLEAR 08/21/2019 1420   LABSPEC 1.016 08/21/2019 1420   PHURINE 5.0 08/21/2019 1420   GLUCOSEU NEGATIVE 08/21/2019 1420   HGBUR NEGATIVE 08/21/2019 1420   HGBUR trace-lysed 06/09/2009 0914   BILIRUBINUR NEGATIVE 08/21/2019 1420   BILIRUBINUR n 08/25/2014 1240   KETONESUR NEGATIVE 08/21/2019 1420   PROTEINUR NEGATIVE 08/21/2019 1420   UROBILINOGEN 0.2 08/25/2014 1240   UROBILINOGEN 0.2 06/09/2009 0914   NITRITE NEGATIVE 08/21/2019 1420   LEUKOCYTESUR NEGATIVE 08/21/2019 1420   Sepsis Labs Recent Labs  Lab 10/18/21 0815 10/18/21 2038 10/19/21 0500  WBC 10.2 8.2 7.8   Microbiology No results found for this or any previous visit (from the past 240 hour(s)).   Time coordinating discharge: 35 minutes  SIGNED:   Aline August, MD  [facility] 10/19/2021, 11:16 AM

## 2021-10-19 NOTE — Progress Notes (Signed)
  Subjective Doing much better today. No abdominal pain. Bleeding subsided - still some with BM but much less.  Objective: Vital signs in last 24 hours: Temp:  [97.7 F (36.5 C)-98.2 F (36.8 C)] 98 F (36.7 C) (08/24 0558) Pulse Rate:  [59-95] 65 (08/24 0558) Resp:  [5-20] 18 (08/24 0558) BP: (105-158)/(46-133) 140/75 (08/24 0558) SpO2:  [90 %-99 %] 98 % (08/24 0558)    Intake/Output from previous day: No intake/output data recorded. Intake/Output this shift: No intake/output data recorded.  Gen: NAD, comfortable, in good spirits Pulm: Normal work of breathing Abd: Soft, NT/ND Anorectal: no active bleeding Ext: SCDs in place  Lab Results: CBC  Recent Labs    10/18/21 2038 10/19/21 0500  WBC 8.2 7.8  HGB 12.7* 12.2*  HCT 39.3 37.5*  PLT 192 159   BMET Recent Labs    10/18/21 0815 10/19/21 0500  NA 141 142  K 3.9 3.4*  CL 104 110  CO2 26 27  GLUCOSE 94 91  BUN 22 23  CREATININE 1.40* 1.47*  CALCIUM 9.2 7.9*   PT/INR Recent Labs    10/18/21 0815  LABPROT 17.6*  INR 1.5*   ABG No results for input(s): "PHART", "HCO3" in the last 72 hours.  Invalid input(s): "PCO2", "PO2"  Studies/Results:  Anti-infectives: Anti-infectives (From admission, onward)    None        Assessment/Plan: Patient Active Problem List   Diagnosis Date Noted   Rectal bleeding 10/18/2021   BPH with urinary obstruction 01/27/2021   Ankle edema, bilateral 11/13/2019   Primary osteoarthritis of left hip 09/01/2019   Herpes zoster without complication 61/95/0932   Primary osteoarthritis of right hip 10/24/2018   Neuropathy 07/30/2017   Prolapsed internal hemorrhoids, grade 2-3 10/19/2016   Constipation 09/29/2015   CKD (chronic kidney disease), stage III ([place])    Dizziness 06/26/2015   Long QT interval 06/26/2015   Visit for monitoring Tikosyn therapy 06/26/2015   Atypical chest pain 06/26/2015   Persistent atrial fibrillation ([place]) 06/21/2015   Hypertensive heart  disease    Hypothyroidism    Non-obstructive CAD    Carotid disease, bilateral ([place])    OBESITY 09/20/2008   Hyperlipidemia 12/17/2005   Essential hypertension 12/17/2005  PLAN  -Doing reasonably well, bleeding has slowed and now appears to be more of an ooze with bm. Hgb remains in 12s -Diet as tolerated -Miralax BID; metamucil daily -Ambulate 5x/day; SCDs  -He would like to go home today - I think this would be reasonable. We had discussed him holding off on Xarelto for another 48 hrs to ensure no further significant bleeding events, restarting Saturday if all is going well. Also reviewed the importance of continuing BID miralax for the foreseeable future. He understands to call with any questions or concerns in the interim  Nadeen Landau, MD [facility], [place]

## 2021-10-23 ENCOUNTER — Telehealth: Payer: Self-pay

## 2021-10-23 NOTE — Telephone Encounter (Signed)
Transition Care Management Follow-up Telephone Call Date of discharge and from where: [facility] 10-19-21 Dx: rectal bleeding How have you been since you were released from the hospital? Doing somewhat ok  Any questions or concerns? No  Items Reviewed: Did the pt receive and understand the discharge instructions provided? Yes  Medications obtained and verified? Yes  Other? No  Any new allergies since your discharge? No  Dietary orders reviewed? Yes Do you have support at home? Yes   Home Care and Equipment/Supplies: Were home health services ordered? no If so, what is the name of the agency? na  Has the agency set up a time to come to the patient's home? not applicable Were any new equipment or medical supplies ordered?  No What is the name of the medical supply agency? na Were you able to get the supplies/equipment? not applicable Do you have any questions related to the use of the equipment or supplies? No  Functional Questionnaire: (I = Independent and D = Dependent) ADLs: I  Bathing/Dressing- I  Meal Prep- I  Eating- I  Maintaining continence- I  Transferring/Ambulation- I  Managing Meds- I  Follow up appointments reviewed:  PCP Hospital f/u appt confirmed? Yes  Scheduled to see Dr Volanda Napoleon on 10-26-21 @ [facility] f/u appt confirmed? Yes  Scheduled to see Dr Dema Severin on 11-03-21 @ 230pm. Are transportation arrangements needed? No  If their condition worsens, is the pt aware to call PCP or go to the Emergency Dept.? Yes Was the patient provided with contact information for the PCP's office or ED? Yes Was to pt encouraged to call back with questions or concerns? Yes

## 2021-10-26 ENCOUNTER — Telehealth: Payer: Medicare HMO | Admitting: Family Medicine

## 2021-10-27 DIAGNOSIS — Z9889 Other specified postprocedural states: Secondary | ICD-10-CM | POA: Diagnosis not present

## 2021-10-27 DIAGNOSIS — Z8719 Personal history of other diseases of the digestive system: Secondary | ICD-10-CM | POA: Diagnosis not present

## 2021-11-09 ENCOUNTER — Other Ambulatory Visit: Payer: Self-pay | Admitting: Family Medicine

## 2021-11-13 DIAGNOSIS — H40013 Open angle with borderline findings, low risk, bilateral: Secondary | ICD-10-CM | POA: Diagnosis not present

## 2021-11-13 DIAGNOSIS — H5021 Vertical strabismus, right eye: Secondary | ICD-10-CM | POA: Diagnosis not present

## 2021-11-13 DIAGNOSIS — H43813 Vitreous degeneration, bilateral: Secondary | ICD-10-CM | POA: Diagnosis not present

## 2021-11-13 DIAGNOSIS — Z961 Presence of intraocular lens: Secondary | ICD-10-CM | POA: Diagnosis not present

## 2021-11-30 ENCOUNTER — Telehealth: Payer: Self-pay

## 2021-11-30 ENCOUNTER — Ambulatory Visit (INDEPENDENT_AMBULATORY_CARE_PROVIDER_SITE_OTHER): Payer: Medicare HMO | Admitting: Family Medicine

## 2021-11-30 ENCOUNTER — Encounter: Payer: Self-pay | Admitting: Family Medicine

## 2021-11-30 VITALS — BP 82/58 | HR 75 | Temp 97.9°F | Wt 250.0 lb

## 2021-11-30 DIAGNOSIS — I10 Essential (primary) hypertension: Secondary | ICD-10-CM | POA: Diagnosis not present

## 2021-11-30 DIAGNOSIS — R5383 Other fatigue: Secondary | ICD-10-CM | POA: Diagnosis not present

## 2021-11-30 DIAGNOSIS — N1832 Chronic kidney disease, stage 3b: Secondary | ICD-10-CM | POA: Diagnosis not present

## 2021-11-30 DIAGNOSIS — I4819 Other persistent atrial fibrillation: Secondary | ICD-10-CM | POA: Diagnosis not present

## 2021-11-30 LAB — BASIC METABOLIC PANEL
BUN: 20 mg/dL (ref 6–23)
CO2: 28 mEq/L (ref 19–32)
Calcium: 9 mg/dL (ref 8.4–10.5)
Chloride: 105 mEq/L (ref 96–112)
Creatinine, Ser: 1.36 mg/dL (ref 0.40–1.50)
GFR: 51.18 mL/min — ABNORMAL LOW (ref >60.00)
Glucose, Bld: 101 mg/dL — ABNORMAL HIGH (ref 70–99)
Potassium: 3.6 mEq/L (ref 3.5–5.1)
Sodium: 141 mEq/L (ref 135–145)

## 2021-11-30 LAB — CBC WITH DIFFERENTIAL/PLATELET
Basophils Absolute: 0 10*3/uL (ref 0.0–0.1)
Basophils Relative: 0.3 % (ref 0.0–3.0)
Eosinophils Absolute: 0.1 10*3/uL (ref 0.0–0.7)
Eosinophils Relative: 1.1 % (ref 0.0–5.0)
HCT: 41.9 % (ref 39.0–52.0)
Hemoglobin: 14.2 g/dL (ref 13.0–17.0)
Lymphocytes Relative: 7.7 % — ABNORMAL LOW (ref 12.0–46.0)
Lymphs Abs: 0.8 10*3/uL (ref 0.7–4.0)
MCHC: 33.8 g/dL (ref 30.0–36.0)
MCV: 88.6 fl (ref 78.0–100.0)
Monocytes Absolute: 0.8 10*3/uL (ref 0.1–1.0)
Monocytes Relative: 7.4 % (ref 3.0–12.0)
Neutro Abs: 8.9 10*3/uL — ABNORMAL HIGH (ref 1.4–7.7)
Neutrophils Relative %: 83.5 % — ABNORMAL HIGH (ref 43.0–77.0)
Platelets: 191 10*3/uL (ref 150.0–400.0)
RBC: 4.73 Mil/uL (ref 4.22–5.81)
RDW: 15.3 % (ref 11.5–15.5)
WBC: 10.7 10*3/uL — ABNORMAL HIGH (ref 4.0–10.5)

## 2021-11-30 MED ORDER — LOSARTAN POTASSIUM 50 MG PO TABS: 25.0000 mg | ORAL_TABLET | Freq: Every day | ORAL | 3 refills | Status: DC

## 2021-11-30 MED ORDER — FUROSEMIDE 40 MG PO TABS
20.0000 mg | ORAL_TABLET | Freq: Every day | ORAL | 3 refills | Status: DC
Start: 2021-11-30 — End: 2022-04-18

## 2021-11-30 NOTE — Telephone Encounter (Signed)
Call back received from Pt's wife.  Pt is scheduled with afib clinic for Monday December 04, 2021 at 8:30 am.  Directions given to new afib location.

## 2021-11-30 NOTE — Telephone Encounter (Signed)
Left detailed message requesting call back to device clinic to assess for s/s.  Pacemaker alert received.  High Ventricular Rate detected.  Long AT/AF Episode(s). Patient has PAF, burden longest duration 77 hours 56 minutes 26 seconds on 11/26/21.  Presenting EGM shows AF with average V rate 136 bpm.  On OAC per 08/14/21 In-Clinic report; unable to access Epic to confirm.  V rates during AF right shifted, primarily >130 bpm. 3 VHR episodes.  Longest duration 14 hours 23 minutes 21 seconds.  EGMs show AF with RVR, average V rates 156-158 bpm.  Triaged to clinic.

## 2021-11-30 NOTE — Progress Notes (Signed)
   Subjective:    Patient ID: Erik [name], male    DOB: 04-May-1945, 76 y.o.   MRN: [id number]  HPI Here for one month of fatigue and SOB on exertion. No chest pain or palpitations. He has a BP cuff at home but does not use it. He had a hemorrhoidectomy on 10-13-21, and this was complicated by some postsurgical bleeding. This was eventually stopped, and on 10-19-21 he had labs showing a Hgb of 12.7 and a creatinine of 1.47. Of note, he has lost 15 lbs since April by watching his diet.    Review of Systems  Constitutional:  Positive for fatigue. Negative for fever.  Respiratory:  Positive for shortness of breath. Negative for cough and wheezing.   Cardiovascular: Negative.   Gastrointestinal: Negative.   Genitourinary: Negative.   Neurological: Negative.        Objective:   Physical Exam Constitutional:      Appearance: Normal appearance.  Cardiovascular:     Rate and Rhythm: Normal rate. Rhythm irregular.     Pulses: Normal pulses.     Heart sounds: Normal heart sounds.  Pulmonary:     Effort: Pulmonary effort is normal.     Breath sounds: Normal breath sounds.  Musculoskeletal:     Comments: 1+ edema in both ankles   Neurological:     Mental Status: He is alert.           Assessment & Plan:  His fatigue and SOB are likely related to hypotension. Now that he has lost some weight, he is overmedicated. We will decrease the Lasix to 1/2 a tablet (20 mg) daily and we will decrease the Losartan to 1/2 a tablet (25 mg) daily. Check another CBC and BMET today. He will monitor the BP at home and will return to see [place] in one week . [name], MD

## 2021-12-04 ENCOUNTER — Other Ambulatory Visit: Payer: Self-pay

## 2021-12-04 ENCOUNTER — Ambulatory Visit (HOSPITAL_BASED_OUTPATIENT_CLINIC_OR_DEPARTMENT_OTHER)
Admission: RE | Admit: 2021-12-04 | Discharge: 2021-12-04 | Disposition: A | Discharge status: Home / Self Care | Payer: Medicare HMO | Source: Ambulatory Visit | Attending: Physician Assistant | Admitting: Physician Assistant

## 2021-12-04 ENCOUNTER — Encounter (HOSPITAL_COMMUNITY): Payer: Self-pay | Admitting: Physician Assistant

## 2021-12-04 ENCOUNTER — Emergency Department (HOSPITAL_COMMUNITY)
Admission: EM | Admit: 2021-12-04 | Discharge: 2021-12-04 | Disposition: A | Discharge status: Home / Self Care | Payer: Medicare HMO | Attending: Emergency Medicine | Admitting: Emergency Medicine

## 2021-12-04 ENCOUNTER — Emergency Department (HOSPITAL_COMMUNITY): Payer: Medicare HMO

## 2021-12-04 VITALS — BP 98/62 | HR 165 | Ht 69.0 in | Wt 254.2 lb

## 2021-12-04 DIAGNOSIS — Z85828 Personal history of other malignant neoplasm of skin: Secondary | ICD-10-CM | POA: Diagnosis not present

## 2021-12-04 DIAGNOSIS — I4891 Unspecified atrial fibrillation: Secondary | ICD-10-CM | POA: Diagnosis not present

## 2021-12-04 DIAGNOSIS — I4819 Other persistent atrial fibrillation: Secondary | ICD-10-CM | POA: Insufficient documentation

## 2021-12-04 DIAGNOSIS — E039 Hypothyroidism, unspecified: Secondary | ICD-10-CM | POA: Insufficient documentation

## 2021-12-04 DIAGNOSIS — N183 Chronic kidney disease, stage 3 unspecified: Secondary | ICD-10-CM | POA: Insufficient documentation

## 2021-12-04 DIAGNOSIS — R600 Localized edema: Secondary | ICD-10-CM | POA: Insufficient documentation

## 2021-12-04 DIAGNOSIS — I2510 Atherosclerotic heart disease of native coronary artery without angina pectoris: Secondary | ICD-10-CM | POA: Insufficient documentation

## 2021-12-04 DIAGNOSIS — Z950 Presence of cardiac pacemaker: Secondary | ICD-10-CM | POA: Insufficient documentation

## 2021-12-04 DIAGNOSIS — I4892 Unspecified atrial flutter: Secondary | ICD-10-CM

## 2021-12-04 DIAGNOSIS — Z96642 Presence of left artificial hip joint: Secondary | ICD-10-CM | POA: Diagnosis not present

## 2021-12-04 DIAGNOSIS — R5383 Other fatigue: Secondary | ICD-10-CM | POA: Insufficient documentation

## 2021-12-04 DIAGNOSIS — R531 Weakness: Secondary | ICD-10-CM | POA: Diagnosis not present

## 2021-12-04 DIAGNOSIS — Z7901 Long term (current) use of anticoagulants: Secondary | ICD-10-CM | POA: Insufficient documentation

## 2021-12-04 DIAGNOSIS — I129 Hypertensive chronic kidney disease with stage 1 through stage 4 chronic kidney disease, or unspecified chronic kidney disease: Secondary | ICD-10-CM | POA: Diagnosis not present

## 2021-12-04 DIAGNOSIS — R9431 Abnormal electrocardiogram [ECG] [EKG]: Secondary | ICD-10-CM | POA: Insufficient documentation

## 2021-12-04 DIAGNOSIS — Z79899 Other long term (current) drug therapy: Secondary | ICD-10-CM | POA: Insufficient documentation

## 2021-12-04 DIAGNOSIS — R002 Palpitations: Secondary | ICD-10-CM | POA: Diagnosis not present

## 2021-12-04 DIAGNOSIS — Z87891 Personal history of nicotine dependence: Secondary | ICD-10-CM | POA: Diagnosis not present

## 2021-12-04 LAB — COMPREHENSIVE METABOLIC PANEL
ALT: 21 U/L (ref 0–44)
AST: 17 U/L (ref 15–41)
Albumin: 3.7 g/dL (ref 3.5–5.0)
Alkaline Phosphatase: 46 U/L (ref 38–126)
Anion gap: 12 (ref 5–15)
BUN: 16 mg/dL (ref 8–23)
CO2: 25 mmol/L (ref 22–32)
Calcium: 9.3 mg/dL (ref 8.9–10.3)
Chloride: 103 mmol/L (ref 98–111)
Creatinine, Ser: 1.37 mg/dL — ABNORMAL HIGH (ref 0.61–1.24)
GFR, Estimated: 54 mL/min — ABNORMAL LOW (ref >60)
Glucose, Bld: 120 mg/dL — ABNORMAL HIGH (ref 70–99)
Potassium: 3.7 mmol/L (ref 3.5–5.1)
Sodium: 140 mmol/L (ref 135–145)
Total Bilirubin: 1 mg/dL (ref 0.3–1.2)
Total Protein: 6.8 g/dL (ref 6.5–8.1)

## 2021-12-04 LAB — CBC WITH DIFFERENTIAL/PLATELET
Abs Immature Granulocytes: 0.06 10*3/uL (ref 0.00–0.07)
Basophils Absolute: 0 10*3/uL (ref 0.0–0.1)
Basophils Relative: 0 %
Eosinophils Absolute: 0 10*3/uL (ref 0.0–0.5)
Eosinophils Relative: 0 %
HCT: 42.8 % (ref 39.0–52.0)
Hemoglobin: 13.9 g/dL (ref 13.0–17.0)
Immature Granulocytes: 1 %
Lymphocytes Relative: 4 %
Lymphs Abs: 0.4 10*3/uL — ABNORMAL LOW (ref 0.7–4.0)
MCH: 29.6 pg (ref 26.0–34.0)
MCHC: 32.5 g/dL (ref 30.0–36.0)
MCV: 91.3 fL (ref 80.0–100.0)
Monocytes Absolute: 0.9 10*3/uL (ref 0.1–1.0)
Monocytes Relative: 9 %
Neutro Abs: 8.8 10*3/uL — ABNORMAL HIGH (ref 1.7–7.7)
Neutrophils Relative %: 86 %
Platelets: 186 10*3/uL (ref 150–400)
RBC: 4.69 MIL/uL (ref 4.22–5.81)
RDW: 14.4 % (ref 11.5–15.5)
WBC: 10.2 10*3/uL (ref 4.0–10.5)
nRBC: 0 % (ref 0.0–0.2)

## 2021-12-04 LAB — URINALYSIS, ROUTINE W REFLEX MICROSCOPIC
Bilirubin Urine: NEGATIVE
Glucose, UA: NEGATIVE mg/dL
Hgb urine dipstick: NEGATIVE
Ketones, ur: NEGATIVE mg/dL
Leukocytes,Ua: NEGATIVE
Nitrite: NEGATIVE
Protein, ur: NEGATIVE mg/dL
Specific Gravity, Urine: 1.008 (ref 1.005–1.030)
pH: 7 (ref 5.0–8.0)

## 2021-12-04 LAB — TROPONIN I (HIGH SENSITIVITY)
Troponin I (High Sensitivity): 23 ng/L — ABNORMAL HIGH (ref <18)
Troponin I (High Sensitivity): 16 ng/L (ref <18)

## 2021-12-04 LAB — TSH: TSH: 0.882 u[IU]/mL (ref 0.350–4.500)

## 2021-12-04 LAB — BRAIN NATRIURETIC PEPTIDE: B Natriuretic Peptide: 268.7 pg/mL — ABNORMAL HIGH (ref 0.0–100.0)

## 2021-12-04 MED ORDER — DILTIAZEM HCL-DEXTROSE 125-5 MG/125ML-% IV SOLN (PREMIX)
5.0000 mg/h | INTRAVENOUS | Status: DC
  Administered 2021-12-04: 5 mg/h via INTRAVENOUS
  Filled 2021-12-04: qty 125

## 2021-12-04 MED ORDER — FENTANYL CITRATE PF 50 MCG/ML IJ SOSY
50.0000 ug | PREFILLED_SYRINGE | Freq: Once | INTRAMUSCULAR | Status: AC
  Administered 2021-12-04: 50 ug via INTRAVENOUS
  Filled 2021-12-04: qty 1

## 2021-12-04 MED ORDER — SODIUM CHLORIDE 0.9 % IV BOLUS
500.0000 mL | Freq: Once | INTRAVENOUS | Status: AC
Start: 2021-12-04 — End: 2021-12-04
  Administered 2021-12-04: 500 mL via INTRAVENOUS

## 2021-12-04 MED ORDER — ETOMIDATE 2 MG/ML IV SOLN
0.1500 mg/kg | Freq: Once | INTRAVENOUS | Status: AC
  Administered 2021-12-04: 17.4 mg via INTRAVENOUS
  Filled 2021-12-04: qty 10

## 2021-12-04 MED ORDER — FENTANYL CITRATE (PF) 100 MCG/2ML IJ SOLN
INTRAMUSCULAR | Status: AC | PRN
  Administered 2021-12-04: 50 ug via INTRAVENOUS

## 2021-12-04 MED ORDER — DILTIAZEM LOAD VIA INFUSION
15.0000 mg | Freq: Once | INTRAVENOUS | Status: AC
  Administered 2021-12-04: 15 mg via INTRAVENOUS
  Filled 2021-12-04: qty 15

## 2021-12-04 MED ORDER — ETOMIDATE 2 MG/ML IV SOLN
INTRAVENOUS | Status: AC | PRN
  Administered 2021-12-04: 17.4 mg via INTRAVENOUS

## 2021-12-04 MED ORDER — MAGNESIUM SULFATE 2 GM/50ML IV SOLN
2.0000 g | Freq: Once | INTRAVENOUS | Status: AC
Start: 2021-12-04 — End: 2021-12-04
  Administered 2021-12-04: 2 g via INTRAVENOUS
  Filled 2021-12-04: qty 50

## 2021-12-04 NOTE — ED Notes (Signed)
Patient ambulated without assistance. Patient also given food for PO challenge.

## 2021-12-04 NOTE — Sedation Documentation (Signed)
Pt being provided assisted ventilation via BVM by Lovena Le, RT.

## 2021-12-04 NOTE — Sedation Documentation (Signed)
Shock delivered at 44J by Truett Mainland, MD. NSR on monitor.

## 2021-12-04 NOTE — Consult Note (Signed)
   Patient seen, case reviewed    I agree with recommendations made by Maximino Greenland in clnic earlier  Pt noted to have high HR on device.   He does not sense all   Does however note increased fatigue  I would recomm DCCV    he has not missed any doses of  Xarelto     Appt has already been set up for him after          Signed, Dorris Carnes, MD  12/04/2021, 1:55 PM

## 2021-12-04 NOTE — ED Provider Notes (Signed)
[facility] EMERGENCY DEPARTMENT Provider Note  CSN: [id number] Arrival date & time: 12/04/21 3299  Chief Complaint(s) No chief complaint on file.  HPI Erik [name] is a 76 y.o. male with history of coronary artery disease, hyperlipidemia, hypertension, paroxysmal atrial fibrillation on Xarelto presenting with generalized weakness.  Patient reports around 2 weeks of generalized weakness and fatigue.  Denies focal weakness or unilateral weakness.  Reports fever of 101 last night, otherwise no fevers.  Denies any sore throat, runny nose, headaches, cough, abdominal pain, nausea or vomiting, diarrhea, rashes.  Reports he had some chest pressure few days ago but nothing currently.  Reports mild shortness of breath.  His pacemaker noted RVR and he was referred to A-fib clinic, he went today they sent him to the emergency department as he was hypotensive to the 24Q systolic  Past Medical History Past Medical History:  Diagnosis Date   Anxiety    Arthritis    Atypical chest pain 06/26/2015   BPH with urinary obstruction    Cancer (HCC)BASAL CELL SKIN CANCER    Carotid disease, bilateral ([place])    a. 09/2014 Carotid U/S: 1-39% bilat ICA stenosis.   Chronic lower back pain    CKD (chronic kidney disease), stage III ([place])    pt. [name] denies   Diverticulitis 12/2016   Diverticulosis    Dyspnea    WITH EXERTION   GERD (gastroesophageal reflux disease)    Gout attack    LAST FLARE 08-31-2021 ALL SYMPTOMS RESOLVED   History of kidney stones    Hx of adenomatous colonic polyps 07/2020   2 diminutive   Hyperlipidemia    Hypertension    Hypothyroidism    Internal hemorrhoids    Long QT interval 06/26/2015   Nephrolithiasis    Neuropathy 07/30/2017   TOES ON BOTH FEET   Non-obstructive CAD    a. 07/2002 Cath: LM 20, LAD 27md, LCX 50-667mOM1 4045mCA 51m7m 60%; b. 05/2014 MV: low risk w/ small sized, mild intensity rev defect in apical/inferior/infsept area, nl EF->Med Rx.    Panic attack    NONE RECENT PER WIFE 10-09-2021   Paroxysmal atrial fibrillation (HCC)[place] a. Dx 04/2014; b. 05/2014 Echo: Ef 60-65%, no rwma, triv MR/TR, nl RV;  c. CHA2DS2VASc = 3-->was on eliquis, switched to xarelto 04/2015 2/2 cost; d. 05/2015 Tikosyn loaded w/ conversion to AF; e. 06/2015 QTc prolongation and bradycardia->tikosyn d/c'd, PPM placed, Amio started; f. 07/01/2015 In Aflutter @ clinic f/u.   Paroxysmal atrial flutter (HCC)[place] a. 06/2015 noted to be in rapid Aflutter in Afib clinic-->amio load continued.   Persistent atrial fibrillation (HCC)[place]/25/2017   Presence of permanent cardiac pacemaker    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 s/p SJM DC PPM (ser # @ [id number] Patient Active Problem List   Diagnosis Date Noted   Rectal bleeding 10/18/2021   BPH with urinary obstruction 01/27/2021   Ankle edema, bilateral 11/13/2019   Primary osteoarthritis of left hip 09/01/2019   Herpes zoster without complication 05/122/29/7989rimary osteoarthritis of right hip 10/24/2018   Neuropathy 07/30/2017   Prolapsed internal hemorrhoids, grade 2-3 10/19/2016   Constipation 09/29/2015   CKD (chronic kidney disease),  stage III (HCC)    Dizziness 06/26/2015   Long QT interval 06/26/2015   Visit for monitoring Tikosyn therapy 06/26/2015   Atypical chest pain 06/26/2015   Persistent atrial fibrillation ([place]) 06/21/2015   Hypertensive heart disease    Hypothyroidism    Non-obstructive CAD    Carotid disease, bilateral ([place])    OBESITY 09/20/2008   Hyperlipidemia 12/17/2005   Essential hypertension 12/17/2005   Home Medication(s) Prior to Admission medications   Medication Sig Start Date End Date Taking? Authorizing Provider  acetaminophen (TYLENOL) 500 MG tablet Take 1,000 mg by mouth every  6 (six) hours as needed for moderate pain.   Yes [provider]  diltiazem (CARDIZEM CD) 120 MG 24 hr capsule TAKE 1 CAPSULE(120 MG) BY MOUTH DAILY Patient taking differently: Take 120 mg by mouth daily. 11/09/21  Yes [name], MD  famotidine (PEPCID) 20 MG tablet Take 20 mg by mouth daily as needed (acid reflux).   Yes [provider]  furosemide (LASIX) 40 MG tablet Take 0.5 tablets (20 mg total) by mouth daily. 11/30/21  Yes [name], MD  gabapentin (NEURONTIN) 600 MG tablet TAKE 1 TABLET(600 MG) BY MOUTH THREE TIMES DAILY Patient taking differently: Take 600 mg by mouth 3 (three) times daily. 09/26/21  Yes [name], MD  levothyroxine (SYNTHROID) 50 MCG tablet TAKE 1 TABLET BY MOUTH EVERY DAY Patient taking differently: Take 50 mcg by mouth daily before breakfast. 01/06/21  Yes [name], MD  losartan (COZAAR) 50 MG tablet Take 0.5 tablets (25 mg total) by mouth daily. Patient taking differently: Take 12.5 mg by mouth daily. 11/30/21  Yes [name], MD  polyethylene glycol (MIRALAX / GLYCOLAX) 17 g packet Take 17 g by mouth 2 (two) times daily. 10/19/21  Yes Aline August, MD  potassium chloride (KLOR-CON) 10 MEQ tablet TAKE 1 TABLET(10 MEQ) BY MOUTH DAILY Patient taking differently: Take 10 mEq by mouth daily. 09/01/21  Yes Camnitz, Will Hassell Done, MD  predniSONE (DELTASONE) 10 MG tablet Take 1 tablet (10 mg total) by mouth daily with breakfast. 01/27/21  Yes [name], MD  psyllium (METAMUCIL) 58.6 % powder Take 1 packet by mouth daily. With water or juice   Yes [provider]  rivaroxaban (XARELTO) 20 MG TABS tablet Take 1 tablet (20 mg total) by mouth daily with supper. Restart from 10/21/21 as per general surgery 10/21/21  Yes Aline August, MD  simvastatin (ZOCOR) 20 MG tablet TAKE 1 TABLET(20 MG) BY MOUTH DAILY AT 6 PM Patient taking differently: Take 20 mg by mouth daily at 6 PM. 09/01/21  Yes Camnitz, Will Hassell Done, MD  triamcinolone  (NASACORT ALLERGY 24HR) 55 MCG/ACT AERO nasal inhaler Place 1 spray into the nose at bedtime as needed (nasal congestion).   Yes [provider]  Multiple Vitamin (MULTIVITAMIN) capsule Take 1 capsule by mouth daily. Patient not taking: Reported on 12/04/2021    [provider]  tamsulosin (FLOMAX) 0.4 MG CAPS capsule Take 2 capsules (0.8 mg total) by mouth daily. Patient not taking: Reported on 12/04/2021 08/21/21   [name], MD  triamcinolone cream (KENALOG) 0.1 % Apply 1 Application topically daily as needed (itching, rash). Patient not taking: Reported on 12/04/2021 10/19/21   Aline August, MD  Past Surgical History Past Surgical History:  Procedure Laterality Date   ATRIAL FIBRILLATION ABLATION N/A 05/17/2017   Procedure: ATRIAL FIBRILLATION ABLATION;  Surgeon: Constance Haw, MD;  Location: [facility];  Service: Cardiovascular;  Laterality: N/A;   CARDIAC CATHETERIZATION  ~ 2000   CLOSED REDUCTION HAND FRACTURE Right 1984   COLONOSCOPY  06/27/2015   per Dr. Henrene Pastor, internal hemorrhoids and diverticulae, repeat 10 yrs   COLONSCOPY     FEB OR MARCH 2023 AT DR Carlean Purl OFFICE   CYSTOSCOPY WITH RETROGRADE PYELOGRAM, URETEROSCOPY AND STENT PLACEMENT Bilateral 02/14/2018   Procedure: CYSTOSCOPY WITH RETROGRADE PYELOGRAM, URETEROSCOPY AND STENT PLACEMENT;  Surgeon: Alexis Frock, MD;  Location: [facility];  Service: Urology;  Laterality: Bilateral;   EP IMPLANTABLE DEVICE N/A 06/27/2015   Procedure: Pacemaker Implant;  Surgeon: Evans Lance, MD;  Location: [facility];  Service: Cardiovascular;  Laterality: N/A;   EYE SURGERY  08/03/2019   Cataract   EYE SURGERY  08/17/2019   Cataact   FRACTURE SURGERY     right hand,finger YRS AGO   HEMORRHOID BANDING     DONE SEVERAL TIMES LAST 4 TO 5 YRS AGOPER WIFE 10-09-2021    HEMORRHOID SURGERY N/A 10/13/2021   Procedure: HEMORRHOIDECTOMY 2 vs 3 COLUMN, DISIMPACTION;  Surgeon: Ileana Roup, MD;  Location: [facility];  Service: General;  Laterality: N/A;   HOLMIUM LASER APPLICATION Bilateral 45/62/5638   Procedure: HOLMIUM LASER APPLICATION;  Surgeon: Alexis Frock, MD;  Location: [facility];  Service: Urology;  Laterality: Bilateral;   LACERATION REPAIR Right 1984   "hand"   POSTERIOR LUMBAR FUSION  1998   2 lumbar discs, Dr. Ellene Route; "ray cages"   RECTAL EXAM UNDER ANESTHESIA N/A 10/13/2021   Procedure: ANORECTAL EXAM UNDER ANESTHESIA;  Surgeon: Ileana Roup, MD;  Location: [facility];  Service: General;  Laterality: N/A;   SKIN CANCER EXCISION  09/2019   TOTAL HIP ARTHROPLASTY Left 09/01/2019   Procedure: LEFT TOTAL HIP ARTHROPLASTY ANTERIOR APPROACH;  Surgeon: Melrose Nakayama, MD;  Location: [facility];  Service: Orthopedics;  Laterality: Left;   Family History Family History  Problem Relation Age of Onset   Dementia Mother    Stroke Father    Diabetes Paternal Grandmother    Stroke Paternal Aunt        x 2   Cancer Paternal Aunt        type unknown   Colon cancer Neg Hx    Stomach cancer Neg Hx     Social History Social History   Tobacco Use   Smoking status: Former    Packs/day: 2.00    Years: 28.00    Total pack years: 56.00    Types: Cigarettes    Quit date: 02/27/1995    Years since quitting: 26.7   Smokeless tobacco: Never  Vaping Use   Vaping Use: Never used  Substance Use Topics   Alcohol use: Yes    Alcohol/week: 1.0 standard drink of alcohol    Types: 1 Cans of beer per week    Comment: couple times a month   Drug use: No   Allergies Patient has no known allergies.  Review of Systems Review of Systems  All other systems reviewed and are negative.   Physical Exam Vital Signs  I have reviewed the triage vital signs BP (!) 104/54   Pulse 65   Temp 98.5 F (36.9 C) (Oral)   Resp  16   Ht 5'  9" (1.753 m)   Wt 116 kg   SpO2 95%   BMI 37.77 kg/m  Physical Exam Vitals and nursing note reviewed.  Constitutional:      General: He is not in acute distress.    Appearance: Normal appearance.  HENT:     Mouth/Throat:     Mouth: Mucous membranes are moist.  Eyes:     Conjunctiva/sclera: Conjunctivae normal.  Cardiovascular:     Rate and Rhythm: Tachycardia present. Rhythm irregular.  Pulmonary:     Effort: Pulmonary effort is normal. No respiratory distress.     Breath sounds: Normal breath sounds.  Abdominal:     General: Abdomen is flat.     Palpations: Abdomen is soft.     Tenderness: There is no abdominal tenderness.  Musculoskeletal:     Right lower leg: Edema present.     Left lower leg: Edema present.  Skin:    General: Skin is warm and dry.     Capillary Refill: Capillary refill takes less than 2 seconds.  Neurological:     Mental Status: He is alert and oriented to person, place, and time. Mental status is at baseline.  Psychiatric:        Mood and Affect: Mood normal.        Behavior: Behavior normal.     ED Results and Treatments Labs (all labs ordered are listed, but only abnormal results are displayed) Labs Reviewed  CBC WITH DIFFERENTIAL/PLATELET - Abnormal; Notable for the following components:      Result Value   Neutro Abs 8.8 (*)    Lymphs Abs 0.4 (*)    All other components within normal limits  BRAIN NATRIURETIC PEPTIDE - Abnormal; Notable for the following components:   B Natriuretic Peptide 268.7 (*)    All other components within normal limits  URINALYSIS, ROUTINE W REFLEX MICROSCOPIC - Abnormal; Notable for the following components:   Color, Urine STRAW (*)    All other components within normal limits  COMPREHENSIVE METABOLIC PANEL - Abnormal; Notable for the following components:   Glucose, Bld 120 (*)    Creatinine, Ser 1.37 (*)    GFR, Estimated 54 (*)    All other components within normal limits  TROPONIN I (HIGH  SENSITIVITY) - Abnormal; Notable for the following components:   Troponin I (High Sensitivity) 23 (*)    All other components within normal limits  CULTURE, BLOOD (ROUTINE X 2)  CULTURE, BLOOD (ROUTINE X 2)  URINE CULTURE  TSH  TROPONIN I (HIGH SENSITIVITY)                                                                                                                          Radiology DG Chest Port 1 View  Result Date: 12/04/2021 CLINICAL DATA:  Weakness and palpitations.  Atrial fibrillation. EXAM: PORTABLE CHEST 1 VIEW COMPARISON:  Chest radiographs 08/21/2019 FINDINGS: A pacemaker remains in place. The cardiac silhouette is within normal limits in size. Lung  volumes are low with bibasilar opacities. No large pleural effusion or pneumothorax is identified. No acute osseous abnormality is seen. IMPRESSION: Low lung volumes with bibasilar opacities, likely atelectasis. Electronically Signed   By: Logan Bores M.D.   On: 12/04/2021 09:56    Pertinent labs & imaging results that were available during my care of the patient were reviewed by me and considered in my medical decision making (see MDM for details).  Medications Ordered in ED Medications  diltiazem (CARDIZEM) 1 mg/mL load via infusion 15 mg (15 mg Intravenous Bolus from Bag 12/04/21 1007)    And  diltiazem (CARDIZEM) 125 mg in dextrose 5% 125 mL (1 mg/mL) infusion (0 mg/hr Intravenous Stopped 12/04/21 1519)  sodium chloride 0.9 % bolus 500 mL (0 mLs Intravenous Stopped 12/04/21 1006)  magnesium sulfate IVPB 2 g 50 mL (0 g Intravenous Stopped 12/04/21 1324)  etomidate (AMIDATE) injection 17.4 mg (17.4 mg Intravenous Given 12/04/21 1523)  fentaNYL (SUBLIMAZE) injection 50 mcg (50 mcg Intravenous Given 12/04/21 1524)  fentaNYL (SUBLIMAZE) injection (50 mcg Intravenous Given 12/04/21 1505)  etomidate (AMIDATE) injection (17.4 mg Intravenous Given 12/04/21 1506)                                                                                                                                      Procedures .Critical Care  Performed by: Cristie Hem, MD Authorized by: Cristie Hem, MD   Critical care provider statement:    Critical care time was exclusive of:  Separately billable procedures and treating other patients   Critical care was necessary to treat or prevent imminent or life-threatening deterioration of the following conditions:  Cardiac failure, shock and circulatory failure   Critical care was time spent personally by me on the following activities:  Development of treatment plan with patient or surrogate, discussions with consultants, evaluation of patient's response to treatment, examination of patient, obtaining history from patient or surrogate, ordering and performing treatments and interventions, ordering and review of laboratory studies, ordering and review of radiographic studies, pulse oximetry, re-evaluation of patient's condition and review of old charts .Cardioversion  Date/Time: 12/04/2021 5:11 PM  Performed by: Cristie Hem, MD Authorized by: Cristie Hem, MD   Consent:    Consent obtained:  Verbal and written   Consent given by:  Patient   Risks discussed:  Cutaneous burn, induced arrhythmia, death and pain   Alternatives discussed:  No treatment, rate-control medication, alternative treatment, referral, observation and delayed treatment Pre-procedure details:    Cardioversion basis:  Elective   Rhythm:  Atrial fibrillation   Electrode placement:  Anterior-posterior Patient sedated: Yes. Refer to sedation procedure documentation for details of sedation.  Attempt one:    Cardioversion mode:  Synchronous   Waveform:  Biphasic   Shock (Joules):  120   Shock outcome:  Conversion to normal sinus rhythm Post-procedure details:    Patient status:  Awake  Patient tolerance of procedure:  Tolerated well, no immediate complications .Sedation  Date/Time: 12/04/2021 5:12  PM  Performed by: Cristie Hem, MD Authorized by: Cristie Hem, MD   Consent:    Consent obtained:  Verbal and written   Consent given by:  Patient   Risks discussed:  Allergic reaction, dysrhythmia, inadequate sedation, nausea, vomiting, respiratory compromise necessitating ventilatory assistance and intubation, prolonged sedation necessitating reversal and prolonged hypoxia resulting in organ damage   Alternatives discussed:  Analgesia without sedation Universal protocol:    Procedure explained and questions answered to patient or proxy's satisfaction: yes     Relevant documents present and verified: yes     Test results available: yes     Imaging studies available: yes     Required blood products, implants, devices, and special equipment available: yes     Site/side marked: yes     Immediately prior to procedure, a time out was called: yes     Patient identity confirmed:  Arm band, hospital-assigned identification number and verbally with patient Indications:    Procedure performed:  Cardioversion   Procedure necessitating sedation performed by:  Physician performing sedation Pre-sedation assessment:    Time since last food or drink:  21 hrs   ASA classification: class 3 - patient with severe systemic disease     Mallampati score:  I - soft palate, uvula, fauces, pillars visible   Neck mobility: normal     Pre-sedation assessments completed and reviewed: airway patency, cardiovascular function, hydration status, mental status, nausea/vomiting, pain level, respiratory function and temperature   Immediate pre-procedure details:    Reassessment: Patient reassessed immediately prior to procedure     Reviewed: vital signs, relevant labs/tests and NPO status     Verified: bag valve mask available, emergency equipment available, intubation equipment available, IV patency confirmed and oxygen available   Procedure details (see MAR for exact dosages):    Preoxygenation:  Nasal  cannula   Sedation:  Etomidate   Intended level of sedation: deep   Analgesia:  Fentanyl   Intra-procedure monitoring:  Blood pressure monitoring, continuous pulse oximetry, cardiac monitor, frequent vital sign checks, frequent LOC assessments and continuous capnometry   Intra-procedure events: respiratory depression     Intra-procedure management:  Airway repositioning and BVM ventilation   Total Provider sedation time (minutes):  16 Post-procedure details:    Post-sedation assessment completed:  12/04/2021 4:58 PM   Attendance: Constant attendance by certified staff until patient recovered     Recovery: Patient returned to pre-procedure baseline     Post-sedation assessments completed and reviewed: airway patency, cardiovascular function, hydration status, mental status, nausea/vomiting, pain level, respiratory function and temperature     Patient is stable for discharge or admission: yes     Procedure completion:  Tolerated well, no immediate complications   (including critical care time)  Medical Decision Making / ED Course   MDM:  76 year old male presenting with A-fib with RVR.  Patient very tachycardic to 160.  Initially hypotensive which improved on recheck.  Symptoms may be due to A-fib with RVR, he did have fever last night without specific localizing symptoms.  Blood cultures obtained.  Patient does have some peripheral edema, may have mild decompensation, chest x-ray with possible basilar atelectasis although could have some pulmonary edema.  Will obtain BNP to further evaluate.  Given fatigue will obtain troponin although suspect will be elevated due to demand.  Given hypotension, patient may need to be admitted for further titration of  his home rate control agents  Clinical Course as of 12/04/21 1714  Mon Dec 04, 2021  1541 Cardioversion completed.  Patient feels much better following procedure. [WS]  1709 Patient tolerating p.o., ambulating. Feels well. Will discharge  patient to home. All questions answered. Patient comfortable with plan of discharge. Return precautions discussed with patient and specified on the after visit summary.  [WS]    Clinical Course User Index [WS] Cristie Hem, MD     Additional history obtained: -Additional history obtained from spouse -External records from outside source obtained and reviewed including: Chart review including previous notes, labs, imaging, consultation notes   Lab Tests: -I ordered, reviewed, and interpreted labs.   The pertinent results include:   Labs Reviewed  CBC WITH DIFFERENTIAL/PLATELET - Abnormal; Notable for the following components:      Result Value   Neutro Abs 8.8 (*)    Lymphs Abs 0.4 (*)    All other components within normal limits  BRAIN NATRIURETIC PEPTIDE - Abnormal; Notable for the following components:   B Natriuretic Peptide 268.7 (*)    All other components within normal limits  URINALYSIS, ROUTINE W REFLEX MICROSCOPIC - Abnormal; Notable for the following components:   Color, Urine STRAW (*)    All other components within normal limits  COMPREHENSIVE METABOLIC PANEL - Abnormal; Notable for the following components:   Glucose, Bld 120 (*)    Creatinine, Ser 1.37 (*)    GFR, Estimated 54 (*)    All other components within normal limits  TROPONIN I (HIGH SENSITIVITY) - Abnormal; Notable for the following components:   Troponin I (High Sensitivity) 23 (*)    All other components within normal limits  CULTURE, BLOOD (ROUTINE X 2)  CULTURE, BLOOD (ROUTINE X 2)  URINE CULTURE  TSH  TROPONIN I (HIGH SENSITIVITY)      Imaging Studies ordered: I ordered imaging studies including CXR On my interpretation imaging demonstrates atelectasis I independently visualized and interpreted imaging. I agree with the radiologist interpretation   Medicines ordered and prescription drug management: Meds ordered this encounter  Medications   sodium chloride 0.9 % bolus 500 mL    AND Linked Order Group    diltiazem (CARDIZEM) 1 mg/mL load via infusion 15 mg    diltiazem (CARDIZEM) 125 mg in dextrose 5% 125 mL (1 mg/mL) infusion   magnesium sulfate IVPB 2 g 50 mL   etomidate (AMIDATE) injection 17.4 mg   fentaNYL (SUBLIMAZE) injection 50 mcg   fentaNYL (SUBLIMAZE) injection   etomidate (AMIDATE) injection    -I have reviewed the patients home medicines and have made adjustments as needed   Consultations Obtained: I requested consultation with the cardiologist,  and discussed lab and imaging findings as well as pertinent plan - they recommend: cardioversion   Cardiac Monitoring: The patient was maintained on a cardiac monitor.  I personally viewed and interpreted the cardiac monitored which showed an underlying rhythm of: afib with rvr  Social Determinants of Health:  Factors impacting patients care include: former smoker   Reevaluation: After the interventions noted above, I reevaluated the patient and found that they have resolved  Co morbidities that complicate the patient evaluation  Past Medical History:  Diagnosis Date   Anxiety    Arthritis    Atypical chest pain 06/26/2015   BPH with urinary obstruction    Cancer (HCC)BASAL CELL SKIN CANCER    Carotid disease, bilateral ([place])    a. 09/2014 Carotid U/S: 1-39% bilat ICA stenosis.  Chronic lower back pain    CKD (chronic kidney disease), stage III ([place])    pt. [name] denies   Diverticulitis 12/2016   Diverticulosis    Dyspnea    WITH EXERTION   GERD (gastroesophageal reflux disease)    Gout attack    LAST FLARE 08-31-2021 ALL SYMPTOMS RESOLVED   History of kidney stones    Hx of adenomatous colonic polyps 07/2020   2 diminutive   Hyperlipidemia    Hypertension    Hypothyroidism    Internal hemorrhoids    Long QT interval 06/26/2015   Nephrolithiasis    Neuropathy 07/30/2017   TOES ON BOTH FEET   Non-obstructive CAD    a. 07/2002 Cath: LM 20, LAD 78md, LCX 50-670mOM1 4020mCA 33m50mF 60%; b. 05/2014 MV: low risk w/ small sized, mild intensity rev defect in apical/inferior/infsept area, nl EF->Med Rx.   Panic attack    NONE RECENT PER WIFE 10-09-2021   Paroxysmal atrial fibrillation (HCC)[place] a. Dx 04/2014; b. 05/2014 Echo: Ef 60-65%, no rwma, triv MR/TR, nl RV;  c. CHA2DS2VASc = 3-->was on eliquis, switched to xarelto 04/2015 2/2 cost; d. 05/2015 Tikosyn loaded w/ conversion to AF; e. 06/2015 QTc prolongation and bradycardia->tikosyn d/c'd, PPM placed, Amio started; f. 07/01/2015 In Aflutter @ clinic f/u.   Paroxysmal atrial flutter (HCC)[place] a. 06/2015 noted to be in rapid Aflutter in Afib clinic-->amio load continued.   Persistent atrial fibrillation (HCC)[place]/25/2017   Presence of permanent cardiac pacemaker    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  Despite best efforts to proofread,  errors can occur which can change the documentation meaning.    ScheCristie Hem 12/04/21 1715

## 2021-12-04 NOTE — Discharge Instructions (Addendum)
We evaluated you for your atrial fibrillation.  Your symptoms improved with cardioversion.  Please follow-up closely with Dr. Curt Bears.  Please continue to take your Xarelto as prescribed.  Please return to the emergency department if you develop any new or worsening symptoms such as chest pain, lightheadedness or fainting, fevers or chills, or any other new symptoms.

## 2021-12-04 NOTE — ED Triage Notes (Addendum)
Patient sent to ED from afib clinic for evaluation of atrial fibrillation with RVR and hypotension. Patient reports felling unwell for the last two weeks, reports recent heart medication change shortly before onset of symptoms. Denies chest pain, denies shortness of breath, HR 165 and BP 82/67 in triage.  Patient is alert and oriented at this time.

## 2021-12-04 NOTE — Progress Notes (Signed)
Primary Care Physician: [name], MD Referring Physician: device clinic/Dr. Thereasa Distance Erik [name] is a 76 y.o. male with a h/o afib, s/p PPM, s/p ablation in 2019. He is in the afib clinic per notificantion of the device clinic that he was having increase in tachy burden, with v rates 156-158 bpm. Ekg shows aflutter 2:1 with RVR at 165 bpm and BP  at 98/62. He is very symptomatic  and said it was very difficult to get ready to come here. With soft BP and very fast v rates it was discussed with pt and wife that it was neccessary to send him to  the ER for consideration of urgent cardioversion. They are in agreement. No missed xarelto for at least 21 days with a CHA2DS2VASc  score of at least 3.   Today, he denies symptoms of palpitations, chest pain, shortness of breath, orthopnea, PND, lower extremity edema, dizziness, presyncope, syncope, or neurologic sequela. The patient is tolerating medications without difficulties and is otherwise without complaint today.   Past Medical History:  Diagnosis Date   Anxiety    Arthritis    Atypical chest pain 06/26/2015   BPH with urinary obstruction    Cancer (HCC)BASAL CELL SKIN CANCER    Carotid disease, bilateral ([place])    a. 09/2014 Carotid U/S: 1-39% bilat ICA stenosis.   Chronic lower back pain    CKD (chronic kidney disease), stage III ([place])    pt. [name] denies   Diverticulitis 12/2016   Diverticulosis    Dyspnea    WITH EXERTION   GERD (gastroesophageal reflux disease)    Gout attack    LAST FLARE 08-31-2021 ALL SYMPTOMS RESOLVED   History of kidney stones    Hx of adenomatous colonic polyps 07/2020   2 diminutive   Hyperlipidemia    Hypertension    Hypothyroidism    Internal hemorrhoids    Long QT interval 06/26/2015   Nephrolithiasis    Neuropathy 07/30/2017   TOES ON BOTH FEET   Non-obstructive CAD    a. 07/2002 Cath: LM 20, LAD 81md, LCX 50-682mOM1 4061mCA 6m68m 60%; b. 05/2014 MV: low risk w/ small sized, mild  intensity rev defect in apical/inferior/infsept area, nl EF->Med Rx.   Panic attack    NONE RECENT PER WIFE 10-09-2021   Paroxysmal atrial fibrillation (HCC)[place] a. Dx 04/2014; b. 05/2014 Echo: Ef 60-65%, no rwma, triv MR/TR, nl RV;  c. CHA2DS2VASc = 3-->was on eliquis, switched to xarelto 04/2015 2/2 cost; d. 05/2015 Tikosyn loaded w/ conversion to AF; e. 06/2015 QTc prolongation and bradycardia->tikosyn d/c'd, PPM placed, Amio started; f. 07/01/2015 In Aflutter @ clinic f/u.   Paroxysmal atrial flutter (HCC)[place] a. 06/2015 noted to be in rapid Aflutter in Afib clinic-->amio load continued.   Persistent atrial fibrillation (HCC)[place]/25/2017   Presence of permanent cardiac pacemaker    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 06/27/2015 s/p SJM DC PPM (ser # @ [id number] Past Surgical History:  Procedure Laterality Date   ATRIAL FIBRILLATION ABLATION N/A 05/17/2017   Procedure: ATRIAL FIBRILLATION ABLATION;  Surgeon: CamnConstance Haw;  [facility];  Service: Cardiovascular;  Laterality: N/A;  CARDIAC CATHETERIZATION  ~ 2000   CLOSED REDUCTION HAND FRACTURE Right 1984   COLONOSCOPY  06/27/2015   per Dr. Henrene Pastor, internal hemorrhoids and diverticulae, repeat 10 yrs   COLONSCOPY     FEB OR MARCH 2023 AT DR Carlean Purl OFFICE   CYSTOSCOPY WITH RETROGRADE PYELOGRAM, URETEROSCOPY AND STENT PLACEMENT Bilateral 02/14/2018   Procedure: CYSTOSCOPY WITH RETROGRADE PYELOGRAM, URETEROSCOPY AND STENT PLACEMENT;  Surgeon: Alexis Frock, MD;  Location: [facility];  Service: Urology;  Laterality: Bilateral;   EP IMPLANTABLE DEVICE N/A 06/27/2015   Procedure: Pacemaker Implant;  Surgeon: Evans Lance, MD;  Location: [facility];  Service: Cardiovascular;  Laterality: N/A;   EYE SURGERY  08/03/2019   Cataract    EYE SURGERY  08/17/2019   Cataact   FRACTURE SURGERY     right hand,finger YRS AGO   HEMORRHOID BANDING     DONE SEVERAL TIMES LAST 4 TO 5 YRS AGOPER WIFE 10-09-2021   HEMORRHOID SURGERY N/A 10/13/2021   Procedure: HEMORRHOIDECTOMY 2 vs 3 COLUMN, DISIMPACTION;  Surgeon: Ileana Roup, MD;  Location: [facility];  Service: General;  Laterality: N/A;   HOLMIUM LASER APPLICATION Bilateral 82/50/5397   Procedure: HOLMIUM LASER APPLICATION;  Surgeon: Alexis Frock, MD;  Location: [facility];  Service: Urology;  Laterality: Bilateral;   LACERATION REPAIR Right 1984   "hand"   POSTERIOR LUMBAR FUSION  1998   2 lumbar discs, Dr. Ellene Route; "ray cages"   RECTAL EXAM UNDER ANESTHESIA N/A 10/13/2021   Procedure: ANORECTAL EXAM UNDER ANESTHESIA;  Surgeon: Ileana Roup, MD;  Location: [facility];  Service: General;  Laterality: N/A;   SKIN CANCER EXCISION  09/2019   TOTAL HIP ARTHROPLASTY Left 09/01/2019   Procedure: LEFT TOTAL HIP ARTHROPLASTY ANTERIOR APPROACH;  Surgeon: Melrose Nakayama, MD;  Location: [facility];  Service: Orthopedics;  Laterality: Left;    Current Outpatient Medications  Medication Sig Dispense Refill   acetaminophen (TYLENOL) 500 MG tablet Take 1,000 mg by mouth every 6 (six) hours as needed for moderate pain.  (Patient not taking: Reported on 11/30/2021)     diltiazem (CARDIZEM CD) 120 MG 24 hr capsule TAKE 1 CAPSULE(120 MG) BY MOUTH DAILY 30 capsule 2   famotidine (PEPCID) 20 MG tablet Take 20 mg by mouth daily as needed (acid reflux).     furosemide (LASIX) 40 MG tablet Take 0.5 tablets (20 mg total) by mouth daily. 90 tablet 3   gabapentin (NEURONTIN) 600 MG tablet TAKE 1 TABLET(600 MG) BY MOUTH THREE TIMES DAILY (Patient taking differently: Take 600 mg by mouth 3 (three) times daily.) 270 tablet 1   levothyroxine (SYNTHROID) 50 MCG tablet TAKE 1 TABLET BY MOUTH EVERY DAY (Patient taking differently: Take 50 mcg by mouth daily before  breakfast.) 90 tablet 3   losartan (COZAAR) 50 MG tablet Take 0.5 tablets (25 mg total) by mouth daily. 90 tablet 3   Multiple Vitamin (MULTIVITAMIN) capsule Take 1 capsule by mouth daily.     polyethylene glycol (MIRALAX / GLYCOLAX) 17 g packet Take 17 g by mouth 2 (two) times daily. 30 each 0   potassium chloride (KLOR-CON) 10 MEQ tablet TAKE 1 TABLET(10 MEQ) BY MOUTH DAILY (Patient taking differently: Take 10 mEq by mouth daily.) 90 tablet 3   predniSONE (DELTASONE) 10 MG tablet Take 1 tablet (10 mg total) by mouth daily with breakfast. 90 tablet 3   psyllium (METAMUCIL) 58.6 % powder Take 1 packet by mouth daily. With water or juice  rivaroxaban (XARELTO) 20 MG TABS tablet Take 1 tablet (20 mg total) by mouth daily with supper. Restart from 10/21/21 as per general surgery     simvastatin (ZOCOR) 20 MG tablet TAKE 1 TABLET(20 MG) BY MOUTH DAILY AT 6 PM (Patient taking differently: Take 20 mg by mouth daily at 6 PM.) 90 tablet 3   tamsulosin (FLOMAX) 0.4 MG CAPS capsule Take 2 capsules (0.8 mg total) by mouth daily. 180 capsule 1   triamcinolone cream (KENALOG) 0.1 % Apply 1 Application topically daily as needed (itching, rash).     No current facility-administered medications for this encounter.    No Known Allergies  Social History   Socioeconomic History   Marital status: Married    Spouse name: Not on file   Number of children: 1   Years of education: Not on file   Highest education level: Not on file  Occupational History   Occupation: retired  Tobacco Use   Smoking status: Former    Packs/day: 2.00    Years: 28.00    Total pack years: 56.00    Types: Cigarettes    Quit date: 02/27/1995    Years since quitting: 26.7   Smokeless tobacco: Never  Vaping Use   Vaping Use: Never used  Substance and Sexual Activity   Alcohol use: Yes    Alcohol/week: 1.0 standard drink of alcohol    Types: 1 Cans of beer per week    Comment: couple times a month   Drug use: No   Sexual  activity: Not Currently    Partners: Female  Other Topics Concern   Not on file  Social History Narrative   He is married and retired he has 1 child   Social Determinants of Radio broadcast assistant Strain: Low Risk  (03/20/2021)   Overall Financial Resource Strain (CARDIA)    Difficulty of Paying Living Expenses: Not hard at all  Food Insecurity: No Food Insecurity (03/20/2021)   Hunger Vital Sign    Worried About Running Out of Food in the Last Year: Never true    [place] in the Last Year: Never true  Transportation Needs: No Transportation Needs (03/20/2021)   PRAPARE - Hydrologist (Medical): No    Lack of Transportation (Non-Medical): No  Physical Activity: Inactive (03/20/2021)   Exercise Vital Sign    Days of Exercise per Week: 0 days    Minutes of Exercise per Session: 0 min  Stress: No Stress Concern Present (03/20/2021)   [place]    Feeling of Stress : Not at all  Social Connections: Moderately Isolated (03/02/2020)   Social Connection and Isolation Panel [NHANES]    Frequency of Communication with Friends and Family: More than three times a week    Frequency of Social Gatherings with Friends and Family: Never    Attends Religious Services: Never    Marine scientist or Organizations: No    Attends Archivist Meetings: Never    Marital Status: Married  Human resources officer Violence: Not At Risk (03/02/2020)   Humiliation, Afraid, Rape, and Kick questionnaire    Fear of Current or Ex-Partner: No    Emotionally Abused: No    Physically Abused: No    Sexually Abused: No    Family History  Problem Relation Age of Onset   Dementia Mother    Stroke Father    Diabetes Paternal Grandmother  Stroke Paternal Aunt        x 2   Cancer Paternal Aunt        type unknown   Colon cancer Neg Hx    Stomach cancer Neg Hx     ROS- All systems are reviewed and  negative except as per the HPI above  Physical Exam: There were no vitals filed for this visit. Wt Readings from Last 3 Encounters:  11/30/21 113.4 kg  10/13/21 115.9 kg  08/31/21 116.6 kg    Labs: Lab Results  Component Value Date   NA 141 11/30/2021   K 3.6 11/30/2021   CL 105 11/30/2021   CO2 28 11/30/2021   GLUCOSE 101 (H) 11/30/2021   BUN 20 11/30/2021   CREATININE 1.36 11/30/2021   CALCIUM 9.0 11/30/2021   PHOS 2.1 (L) 05/15/2014   MG 1.8 06/28/2015   Lab Results  Component Value Date   INR 1.5 (H) 10/18/2021   Lab Results  Component Value Date   CHOL 165 05/06/2019   HDL 52.40 05/06/2019   LDLCALC 84 05/06/2019   TRIG 144.0 05/06/2019     GEN- The patient is well appearing, alert and oriented x 3 today.   Head- normocephalic, atraumatic Eyes-  Sclera clear, conjunctiva pink Ears- hearing intact Oropharynx- clear Neck- supple, no JVP Lymph- no cervical lymphadenopathy Lungs- Clear to ausculation bilaterally, normal work of breathing Heart- Regular rate and rhythm, no murmurs, rubs or gallops, PMI not laterally displaced GI- soft, NT, ND, + BS Extremities- no clubbing, cyanosis, or edema MS- no significant deformity or atrophy Skin- no rash or lesion Psych- euthymic mood, full affect Neuro- strength and sensation are intact  EKG-Vent. rate 165 BPM PR interval * ms QRS duration 78 ms QT/QTcB 274/453 ms P-R-T axes * 22 233 Atrial flutter with 2:1 A-V conduction Nonspecific ST and T wave abnormality Abnormal ECG When compared with ECG of 21-Aug-2019 14:18, PREVIOUS ECG IS PRESENT    Assessment and Plan:  1. Symptomatic Atrial flutter with 2:1 v rate at 165 bpm Soft BP this am so not able to treat as outpatient with increase of rate control meds Very winded and fatigued  Elective cardioversion's are at least one week out so I feel the besdt option is the ED for urgent cardioversion ED charge nurse notified and to the ED via w/c.   2.  CHA2DS2VASc score of at least 3  States no missed doses of xarelto 20 mg daily for the last 21 days.   Geroge Baseman Nathan Stallworth, [facility] [address] [place], [place] [address] [phone]

## 2021-12-05 ENCOUNTER — Encounter: Payer: Medicare HMO | Admitting: Student

## 2021-12-05 ENCOUNTER — Encounter: Payer: Self-pay | Admitting: Family Medicine

## 2021-12-05 ENCOUNTER — Telehealth: Payer: Self-pay

## 2021-12-05 LAB — URINE CULTURE: Culture: NO GROWTH

## 2021-12-05 NOTE — Telephone Encounter (Signed)
LM for pt to return my call. Pt had Cardioversion yesterday. Unless he is having symptoms or concerns he does not need this appt today with AT. He has a f/u with the Afib clinic and a recall to see WC in 1 year.

## 2021-12-05 NOTE — Telephone Encounter (Signed)
Pt's Wife called back. He is really tired but able to breath better. She will ask if he's ok to wait until Monday for his f/u in the Afib clinic. If not, she will bring him to his appt scheduled for today.

## 2021-12-09 LAB — CULTURE, BLOOD (ROUTINE X 2)
Culture: NO GROWTH
Culture: NO GROWTH
Special Requests: ADEQUATE

## 2021-12-11 ENCOUNTER — Ambulatory Visit (HOSPITAL_COMMUNITY)
Admit: 2021-12-11 | Discharge: 2021-12-11 | Disposition: A | Discharge status: Home / Self Care | Payer: Medicare HMO | Source: Ambulatory Visit | Attending: Nurse Practitioner | Admitting: Nurse Practitioner

## 2021-12-11 ENCOUNTER — Encounter (HOSPITAL_COMMUNITY): Payer: Self-pay | Admitting: Nurse Practitioner

## 2021-12-11 VITALS — BP 120/68 | HR 64 | Ht 69.0 in | Wt 252.2 lb

## 2021-12-11 DIAGNOSIS — D6869 Other thrombophilia: Secondary | ICD-10-CM

## 2021-12-11 DIAGNOSIS — I4891 Unspecified atrial fibrillation: Secondary | ICD-10-CM

## 2021-12-11 DIAGNOSIS — I4892 Unspecified atrial flutter: Secondary | ICD-10-CM | POA: Diagnosis not present

## 2021-12-11 DIAGNOSIS — I4819 Other persistent atrial fibrillation: Secondary | ICD-10-CM | POA: Diagnosis not present

## 2021-12-11 NOTE — Progress Notes (Signed)
Primary Care Physician: [name], MD Referring Physician: device clinic/Dr. Thereasa Distance Erik [name] is a 76 y.o. male with a h/o afib, s/p PPM, s/p ablation in 2019. He is in the afib clinic per notificantion of the device clinic that he was having increase in tachy burden, with v rates 156-158 bpm. Ekg shows aflutter 2:1 with RVR at 165 bpm and BP  at 98/62. He is very symptomatic  and said it was very difficult to get ready to come here. With soft BP and very fast v rates it was discussed with pt and wife that it was neccessary to send him to  the ER for consideration of urgent cardioversion. They are in agreement. No missed xarelto for at least 21 days with a CHA2DS2VASc  score of at least 3.   F/u in afib clinic, 12/11/21. On last visit, I sent to the ED for  aflutter with v rate of 165 bpm, and soft BP. He had a urgent cardioversion which was successful and EKG today shows a paced rhythm. H feels improved. He is interested in discussing another ablation with Dr. Curt Bears. He had been on Tikosyn in the remote past before receiving PPM.   Today, he denies symptoms of palpitations, chest pain, shortness of breath, orthopnea, PND, lower extremity edema, dizziness, presyncope, syncope, or neurologic sequela. The patient is tolerating medications without difficulties and is otherwise without complaint today.   Past Medical History:  Diagnosis Date   Anxiety    Arthritis    Atypical chest pain 06/26/2015   BPH with urinary obstruction    Cancer (HCC)BASAL CELL SKIN CANCER    Carotid disease, bilateral ([place])    a. 09/2014 Carotid U/S: 1-39% bilat ICA stenosis.   Chronic lower back pain    CKD (chronic kidney disease), stage III ([place])    pt. [name] denies   Diverticulitis 12/2016   Diverticulosis    Dyspnea    WITH EXERTION   GERD (gastroesophageal reflux disease)    Gout attack    LAST FLARE 08-31-2021 ALL SYMPTOMS RESOLVED   History of kidney stones    Hx of adenomatous colonic  polyps 07/2020   2 diminutive   Hyperlipidemia    Hypertension    Hypothyroidism    Internal hemorrhoids    Long QT interval 06/26/2015   Nephrolithiasis    Neuropathy 07/30/2017   TOES ON BOTH FEET   Non-obstructive CAD    a. 07/2002 Cath: LM 20, LAD 5md, LCX 50-68mOM1 4071mCA 87m52m 60%; b. 05/2014 MV: low risk w/ small sized, mild intensity rev defect in apical/inferior/infsept area, nl EF->Med Rx.   Panic attack    NONE RECENT PER WIFE 10-09-2021   Paroxysmal atrial fibrillation (HCC)[place] a. Dx 04/2014; b. 05/2014 Echo: Ef 60-65%, no rwma, triv MR/TR, nl RV;  c. CHA2DS2VASc = 3-->was on eliquis, switched to xarelto 04/2015 2/2 cost; d. 05/2015 Tikosyn loaded w/ conversion to AF; e. 06/2015 QTc prolongation and bradycardia->tikosyn d/c'd, PPM placed, Amio started; f. 07/01/2015 In Aflutter @ clinic f/u.   Paroxysmal atrial flutter (HCC)[place] a. 06/2015 noted to be in rapid Aflutter in Afib clinic-->amio load continued.   Persistent atrial fibrillation (HCC)[place]/25/2017   Presence of permanent cardiac pacemaker    [employer]   Prolapsed internal hemorrhoids, grade 2-3 10/19/2016   LL Gr 3 and RP/RA Gr 2 LL banded 10/19/2016    RLL pneumonia 11/05/2012   Sleep apnea    "  can't tolerate mask" (05/17/2017)   Tachy-brady syndrome ([place])    a. 06/27/2015 s/p SJM DC PPM (ser # @ [id number]).   Past Surgical History:  Procedure Laterality Date   ATRIAL FIBRILLATION ABLATION N/A 05/17/2017   Procedure: ATRIAL FIBRILLATION ABLATION;  Surgeon: Constance Haw, MD;  Location: [facility];  Service: Cardiovascular;  Laterality: N/A;   CARDIAC CATHETERIZATION  ~ 2000   CLOSED REDUCTION HAND FRACTURE Right 1984   COLONOSCOPY  06/27/2015   per Dr. Henrene Pastor, internal hemorrhoids and diverticulae, repeat 10 yrs   COLONSCOPY     FEB OR MARCH 2023 AT DR Carlean Purl OFFICE   CYSTOSCOPY WITH RETROGRADE PYELOGRAM, URETEROSCOPY AND STENT PLACEMENT Bilateral 02/14/2018   Procedure: CYSTOSCOPY WITH RETROGRADE  PYELOGRAM, URETEROSCOPY AND STENT PLACEMENT;  Surgeon: Alexis Frock, MD;  Location: [facility];  Service: Urology;  Laterality: Bilateral;   EP IMPLANTABLE DEVICE N/A 06/27/2015   Procedure: Pacemaker Implant;  Surgeon: Evans Lance, MD;  Location: [facility];  Service: Cardiovascular;  Laterality: N/A;   EYE SURGERY  08/03/2019   Cataract   EYE SURGERY  08/17/2019   Cataact   FRACTURE SURGERY     right hand,finger YRS AGO   HEMORRHOID BANDING     DONE SEVERAL TIMES LAST 4 TO 5 YRS AGOPER WIFE 10-09-2021   HEMORRHOID SURGERY N/A 10/13/2021   Procedure: HEMORRHOIDECTOMY 2 vs 3 COLUMN, DISIMPACTION;  Surgeon: Ileana Roup, MD;  Location: [facility];  Service: General;  Laterality: N/A;   HOLMIUM LASER APPLICATION Bilateral 93/81/8299   Procedure: HOLMIUM LASER APPLICATION;  Surgeon: Alexis Frock, MD;  [facility];  Service: Urology;  Laterality: Bilateral;   LACERATION REPAIR Right 1984   "hand"   POSTERIOR LUMBAR FUSION  1998   2 lumbar discs, Dr. Ellene Route; "ray cages"   RECTAL EXAM UNDER ANESTHESIA N/A 10/13/2021   Procedure: ANORECTAL EXAM UNDER ANESTHESIA;  Surgeon: Ileana Roup, MD;  Location: [facility];  Service: General;  Laterality: N/A;   SKIN CANCER EXCISION  09/2019   TOTAL HIP ARTHROPLASTY Left 09/01/2019   Procedure: LEFT TOTAL HIP ARTHROPLASTY ANTERIOR APPROACH;  Surgeon: Melrose Nakayama, MD;  [facility];  Service: Orthopedics;  Laterality: Left;    Current Outpatient Medications  Medication Sig Dispense Refill   acetaminophen (TYLENOL) 500 MG tablet Take 1,000 mg by mouth every 6 (six) hours as needed for moderate pain.     diltiazem (CARDIZEM CD) 120 MG 24 hr capsule TAKE 1 CAPSULE(120 MG) BY MOUTH DAILY (Patient taking differently: Take 120 mg by mouth daily.) 30 capsule 2   famotidine (PEPCID) 20 MG tablet Take 20 mg by mouth daily as needed (acid reflux).     furosemide (LASIX) 40 MG tablet Take 0.5  tablets (20 mg total) by mouth daily. 90 tablet 3   gabapentin (NEURONTIN) 600 MG tablet TAKE 1 TABLET(600 MG) BY MOUTH THREE TIMES DAILY (Patient taking differently: Take 600 mg by mouth 3 (three) times daily.) 270 tablet 1   levothyroxine (SYNTHROID) 50 MCG tablet TAKE 1 TABLET BY MOUTH EVERY DAY (Patient taking differently: Take 50 mcg by mouth daily before breakfast.) 90 tablet 3   losartan (COZAAR) 50 MG tablet Take 0.5 tablets (25 mg total) by mouth daily. (Patient taking differently: Take 12.5 mg by mouth daily.) 90 tablet 3   Multiple Vitamin (MULTIVITAMIN) capsule Take 1 capsule by mouth daily. (Patient not taking: Reported on 12/04/2021)     polyethylene glycol (MIRALAX / GLYCOLAX)  17 g packet Take 17 g by mouth 2 (two) times daily. 30 each 0   potassium chloride (KLOR-CON) 10 MEQ tablet TAKE 1 TABLET(10 MEQ) BY MOUTH DAILY (Patient taking differently: Take 10 mEq by mouth daily.) 90 tablet 3   predniSONE (DELTASONE) 10 MG tablet Take 1 tablet (10 mg total) by mouth daily with breakfast. 90 tablet 3   psyllium (METAMUCIL) 58.6 % powder Take 1 packet by mouth daily. With water or juice     rivaroxaban (XARELTO) 20 MG TABS tablet Take 1 tablet (20 mg total) by mouth daily with supper. Restart from 10/21/21 as per general surgery     simvastatin (ZOCOR) 20 MG tablet TAKE 1 TABLET(20 MG) BY MOUTH DAILY AT 6 PM (Patient taking differently: Take 20 mg by mouth daily at 6 PM.) 90 tablet 3   tamsulosin (FLOMAX) 0.4 MG CAPS capsule Take 2 capsules (0.8 mg total) by mouth daily. (Patient not taking: Reported on 12/04/2021) 180 capsule 1   triamcinolone (NASACORT ALLERGY 24HR) 55 MCG/ACT AERO nasal inhaler Place 1 spray into the nose at bedtime as needed (nasal congestion).     triamcinolone cream (KENALOG) 0.1 % Apply 1 Application topically daily as needed (itching, rash). (Patient not taking: Reported on 12/04/2021)     No current facility-administered medications for this encounter.    No Known  Allergies  Social History   Socioeconomic History   Marital status: Married    Spouse name: Not on file   Number of children: 1   Years of education: Not on file   Highest education level: Not on file  Occupational History   Occupation: retired  Tobacco Use   Smoking status: Former    Packs/day: 2.00    Years: 28.00    Total pack years: 56.00    Types: Cigarettes    Quit date: 02/27/1995    Years since quitting: 26.8   Smokeless tobacco: Never  Vaping Use   Vaping Use: Never used  Substance and Sexual Activity   Alcohol use: Yes    Alcohol/week: 1.0 standard drink of alcohol    Types: 1 Cans of beer per week    Comment: couple times a month   Drug use: No   Sexual activity: Not Currently    Partners: Female  Other Topics Concern   Not on file  Social History Narrative   He is married and retired he has 1 child   Social Determinants of Radio broadcast assistant Strain: Low Risk  (03/20/2021)   Overall Financial Resource Strain (CARDIA)    Difficulty of Paying Living Expenses: Not hard at all  Food Insecurity: No Food Insecurity (03/20/2021)   Hunger Vital Sign    Worried About Running Out of Food in the Last Year: Never true    [place] in the Last Year: Never true  Transportation Needs: No Transportation Needs (03/20/2021)   PRAPARE - Hydrologist (Medical): No    Lack of Transportation (Non-Medical): No  Physical Activity: Inactive (03/20/2021)   Exercise Vital Sign    Days of Exercise per Week: 0 days    Minutes of Exercise per Session: 0 min  Stress: No Stress Concern Present (03/20/2021)   [place]    Feeling of Stress : Not at all  Social Connections: Moderately Isolated (03/02/2020)   Social Connection and Isolation Panel [NHANES]    Frequency of Communication with Friends and Family:  More than three times a week    Frequency of Social Gatherings with  Friends and Family: Never    Attends Religious Services: Never    Marine scientist or Organizations: No    Attends Archivist Meetings: Never    Marital Status: Married  Human resources officer Violence: Not At Risk (03/02/2020)   Humiliation, Afraid, Rape, and Kick questionnaire    Fear of Current or Ex-Partner: No    Emotionally Abused: No    Physically Abused: No    Sexually Abused: No    Family History  Problem Relation Age of Onset   Dementia Mother    Stroke Father    Diabetes Paternal Grandmother    Stroke Paternal Aunt        x 2   Cancer Paternal Aunt        type unknown   Colon cancer Neg Hx    Stomach cancer Neg Hx     ROS- All systems are reviewed and negative except as per the HPI above  Physical Exam: There were no vitals filed for this visit. Wt Readings from Last 3 Encounters:  12/04/21 116 kg  12/04/21 115.3 kg  11/30/21 113.4 kg    Labs: Lab Results  Component Value Date   NA 140 12/04/2021   K 3.7 12/04/2021   CL 103 12/04/2021   CO2 25 12/04/2021   GLUCOSE 120 (H) 12/04/2021   BUN 16 12/04/2021   CREATININE 1.37 (H) 12/04/2021   CALCIUM 9.3 12/04/2021   PHOS 2.1 (L) 05/15/2014   MG 1.8 06/28/2015   Lab Results  Component Value Date   INR 1.5 (H) 10/18/2021   Lab Results  Component Value Date   CHOL 165 05/06/2019   HDL 52.40 05/06/2019   LDLCALC 84 05/06/2019   TRIG 144.0 05/06/2019     GEN- The patient is well appearing, alert and oriented x 3 today.   Head- normocephalic, atraumatic Eyes-  Sclera clear, conjunctiva pink Ears- hearing intact Oropharynx- clear Neck- supple, no JVP Lymph- no cervical lymphadenopathy Lungs- Clear to ausculation bilaterally, normal work of breathing Heart- Regular rate and rhythm, no murmurs, rubs or gallops, PMI not laterally displaced GI- soft, NT, ND, + BS Extremities- no clubbing, cyanosis, or edema MS- no significant deformity or atrophy Skin- no rash or lesion Psych- euthymic  mood, full affect Neuro- strength and sensation are intact  EKG-Vent. rate 63 BPM PR interval 150 ms QRS duration 80 ms QT/QTcB 402/411 ms P-R-T axes 69 44 15 Atrial-paced rhythm Nonspecific T wave abnormality Abnormal ECG When compared with ECG of 04-Dec-2021 15:09, PREVIOUS ECG IS PRESENT   Assessment and Plan:  1. Symptomatic Atrial flutter with 2:1 v rate at 165 bpm associated with hypotension Received  successful cardioversion in the ED 12/04/21 Continue diltiazem 120 mg daily no trigger identified for recent episode Would like to discuss repeat ablation  2. CHA2DS2VASc score of at least 3  States no missed doses of xarelto 20 mg daily   Appointment with Dr. Curt Bears requested   Geroge Baseman. Windell Musson, [facility] [address] [place], [name] [address] [phone]

## 2021-12-13 ENCOUNTER — Telehealth: Payer: Self-pay | Admitting: Pharmacist

## 2021-12-13 NOTE — Progress Notes (Signed)
Chronic Care Management Pharmacy Assistant   Name: Erik [name]  MRN: [id number] DOB: March 27, 1945  Reason for Encounter: ED follow up per [name]   Recent office visits:  11/30/21 [name], MD - Patient presented for Other fatigue and other concerns Changed Furosemide Changed Losartan  Recent consult visits:  12/11/21 Sherran Needs, NP ( Cardiology) - Patient presented for Atrial fibrillation unspecified type and other concerns. No medication changes.  12/01/21 Sherran Needs, NP (Cardiology) - Patient presented for Persistent Atrial Fibrillation and other concerns. Advised pt to go to the ED  11/03/21 Tami Lin, MD   - Patient presented for Post op from Hemorrhoidectomy. Prescribed Tramadol. Stopped Oxycodone.    Hospital visits:  Medication Reconciliation was completed by comparing discharge summary, patient's EMR and Pharmacy list, and upon discussion with patient.  Patient presented to [facility] ED on 12/04/21 due to Atrial Fibrillation with rapid ventricular response. Patient was present for 7 hours.  New?Medications Started at [facility] Discharge:?? -started none  Medication Changes at Hospital Discharge: -Changed  none  Medications Discontinued at Hospital Discharge: -Stopped  none  Medications that remain the same after Hospital Discharge:??  -All other medications will remain the same.     Patient presented to [facility] ED on 10/18/21 due to Rectal bleeding. Patient was present for 27 hours.  New?Medications Started at [facility] Discharge:?? -started polyethylene glycol  Medication Changes at Hospital Discharge: -Changed  rivaroxaban 20 MG  Medications Discontinued at Hospital Discharge: -Stopped  none  Medications that remain the same after Hospital Discharge:??  -All other medications will remain the same.    Medications: Outpatient Encounter Medications as of 12/13/2021  Medication Sig  Note   acetaminophen (TYLENOL) 500 MG tablet Take 1,000 mg by mouth every 6 (six) hours as needed for moderate pain.    diltiazem (CARDIZEM CD) 120 MG 24 hr capsule TAKE 1 CAPSULE(120 MG) BY MOUTH DAILY (Patient taking differently: Take 120 mg by mouth daily.)    famotidine (PEPCID) 20 MG tablet Take 20 mg by mouth daily as needed (acid reflux).    furosemide (LASIX) 40 MG tablet Take 0.5 tablets (20 mg total) by mouth daily.    gabapentin (NEURONTIN) 600 MG tablet TAKE 1 TABLET(600 MG) BY MOUTH THREE TIMES DAILY (Patient taking differently: Take 600 mg by mouth 3 (three) times daily.)    levothyroxine (SYNTHROID) 50 MCG tablet TAKE 1 TABLET BY MOUTH EVERY DAY (Patient taking differently: Take 50 mcg by mouth daily before breakfast.)    losartan (COZAAR) 50 MG tablet Take 0.5 tablets (25 mg total) by mouth daily.    polyethylene glycol (MIRALAX / GLYCOLAX) 17 g packet Take 17 g by mouth 2 (two) times daily. 10/23/2021: Pt only taking once daily     potassium chloride (KLOR-CON) 10 MEQ tablet TAKE 1 TABLET(10 MEQ) BY MOUTH DAILY (Patient taking differently: Take 10 mEq by mouth daily.)    predniSONE (DELTASONE) 10 MG tablet Take 1 tablet (10 mg total) by mouth daily with breakfast.    psyllium (METAMUCIL) 58.6 % powder Take 1 packet by mouth as needed. With water or juice    rivaroxaban (XARELTO) 20 MG TABS tablet Take 1 tablet (20 mg total) by mouth daily with supper. Restart from 10/21/21 as per general surgery    simvastatin (ZOCOR) 20 MG tablet TAKE 1 TABLET(20 MG) BY MOUTH DAILY AT 6 PM (Patient taking differently: Take 20 mg by mouth daily at 6 PM.)  tamsulosin (FLOMAX) 0.4 MG CAPS capsule Take 2 capsules (0.8 mg total) by mouth daily.    triamcinolone (NASACORT ALLERGY 24HR) 55 MCG/ACT AERO nasal inhaler Place 1 spray into the nose at bedtime as needed (nasal congestion).    triamcinolone cream (KENALOG) 0.1 % Apply 1 Application topically daily as needed (itching, rash).    No  facility-administered encounter medications on file as of 12/13/2021.  [place] for General Review Call  Adherence Review:  Does the Clinical Pharmacist Assistant have access to adherence rates? Yes Adherence rates for STAR metric medications  Losartan (Cozaar) 100 mg - Last filled 01/27/21 90 DS at [facility] Losartan (Cozaar) 100 mg - Last filled 01/18/21 90 DS at [facility] Simvastatin 20 mg - Last filled 09/04/21 90 DS at [facility] Simvastatin 20 mg - Last filled 08/02/21 30 DS at [facility]  Does the patient have >5 day gap between last estimated fill dates for any of the above medications or other medication gaps? Yes Reason for medication gaps. PT reports over abundance of Losartan and taking half pill. Verified fill dates above  Disease State Questions:  Able to connect with Patient? Yes Did patient have any problems with their health recently? Yes Patient reports a-fib on last week. Have you had any admissions or emergency room visits or worsening of your condition(s) since last visit? Yes  Have you had any visits with new specialists or providers since your last visit? No  Have you had any new health care problem(s) since your last visit? No  Have you run out of any of your medications since you last spoke with clinical pharmacist? No  Are there any medications you are not taking as prescribed? No  Are you having any issues or side effects with your medications? No  Do you have any other health concerns or questions you want to discuss with your Clinical Pharmacist before your next visit? No  Are there any health concerns that you feel we can do a better job addressing? No  Are you having any problems with any of the following since the last visit:   None  12. Any falls since last visit? No  13. Any increased or uncontrolled pain since last visit? No   14. Patient reports he has seen Cardio and is due to see Dr Georgia Lopes in Nov to see if he is needing  another procedure. He denies any pain or concerns or needs for fills on medications at this time.  Care Gaps: Zoster Vaccine - Overdue TDAP - Overdue CCM- Declined AWV- 1/23  Star Rating Drugs: Losartan (Cozaar) 100 mg - Last filled 01/27/21 90 DS at [facility] Simvastatin 20 mg - Last filled 09/04/21 90 DS at [facility] Pharmacist Assistant [phone]

## 2021-12-19 ENCOUNTER — Ambulatory Visit (INDEPENDENT_AMBULATORY_CARE_PROVIDER_SITE_OTHER): Payer: Medicare HMO

## 2021-12-19 DIAGNOSIS — I495 Sick sinus syndrome: Secondary | ICD-10-CM | POA: Diagnosis not present

## 2021-12-20 LAB — CUP PACEART REMOTE DEVICE CHECK
Battery Remaining Longevity: 46 mo
Battery Remaining Percentage: 41 %
Battery Voltage: 2.98 V
Brady Statistic AP VP Percent: 1 %
Brady Statistic AP VS Percent: 72 %
Brady Statistic AS VP Percent: 1 %
Brady Statistic AS VS Percent: 28 %
Brady Statistic RA Percent Paced: 67 %
Brady Statistic RV Percent Paced: 1 %
Date Time Interrogation Session: 20231024040014
Implantable Lead Connection Status: 753985
Implantable Lead Connection Status: 753985
Implantable Lead Implant Date: 20170501
Implantable Lead Implant Date: 20170501
Implantable Lead Location: 753859
Implantable Lead Location: 753860
Implantable Pulse Generator Implant Date: 20170501
Lead Channel Impedance Value: 410 Ohm
Lead Channel Impedance Value: 540 Ohm
Lead Channel Pacing Threshold Amplitude: 0.875 V
Lead Channel Pacing Threshold Amplitude: 1 V
Lead Channel Pacing Threshold Pulse Width: 0.5 ms
Lead Channel Pacing Threshold Pulse Width: 0.5 ms
Lead Channel Sensing Intrinsic Amplitude: 2.1 mV
Lead Channel Sensing Intrinsic Amplitude: 7.2 mV
Lead Channel Setting Pacing Amplitude: 1.875
Lead Channel Setting Pacing Amplitude: 2.5 V
Lead Channel Setting Pacing Pulse Width: 0.5 ms
Lead Channel Setting Sensing Sensitivity: 2 mV
Pulse Gen Model: 2272
Pulse Gen Serial Number: 7897836

## 2021-12-31 ENCOUNTER — Other Ambulatory Visit: Payer: Self-pay | Admitting: Cardiovascular Disease

## 2021-12-31 ENCOUNTER — Other Ambulatory Visit: Payer: Self-pay | Admitting: Family Medicine

## 2022-01-01 NOTE — Telephone Encounter (Signed)
Prescription refill request for Xarelto received.  Indication:afib Last office visit:10/23 Weight:114.4 kg Age:76 Scr:1.3 CrCl:80.67 ml/min  Prescription refilled

## 2022-01-02 ENCOUNTER — Telehealth: Payer: Self-pay | Admitting: Family Medicine

## 2022-01-02 NOTE — Telephone Encounter (Signed)
Pt never received new rx   Disp Refills Start End   tamsulosin (FLOMAX) 0.4 MG CAPS capsule 180 capsule 1 08/21/2021    Sig - Route: Take 2 capsules (0.8 mg total) by mouth daily   pt had medication and he doubled up and was taking 2 capsules a day and pt need a new rx  [id number] - [place], [place] - [address] AT [place] Phone: [phone]  Fax: [phone]

## 2022-01-02 NOTE — Telephone Encounter (Signed)
Please disregard the pharm has the rx from 08-21-2021

## 2022-01-02 NOTE — Telephone Encounter (Signed)
Noted  

## 2022-01-08 NOTE — Progress Notes (Signed)
Remote pacemaker transmission.   

## 2022-01-15 ENCOUNTER — Ambulatory Visit: Payer: Medicare HMO | Attending: Cardiology | Admitting: Cardiology

## 2022-01-15 ENCOUNTER — Encounter: Payer: Self-pay | Admitting: Cardiology

## 2022-01-15 VITALS — BP 120/70 | HR 82 | Ht 69.0 in | Wt 257.1 lb

## 2022-01-15 DIAGNOSIS — D6869 Other thrombophilia: Secondary | ICD-10-CM | POA: Diagnosis not present

## 2022-01-15 DIAGNOSIS — Z950 Presence of cardiac pacemaker: Secondary | ICD-10-CM | POA: Diagnosis not present

## 2022-01-15 DIAGNOSIS — I484 Atypical atrial flutter: Secondary | ICD-10-CM

## 2022-01-15 DIAGNOSIS — I4819 Other persistent atrial fibrillation: Secondary | ICD-10-CM | POA: Diagnosis not present

## 2022-01-15 LAB — CUP PACEART INCLINIC DEVICE CHECK
Battery Remaining Longevity: 46 mo
Battery Voltage: 2.98 V
Brady Statistic RA Percent Paced: 64 %
Brady Statistic RV Percent Paced: 0.42 %
Date Time Interrogation Session: 20231120153100
Implantable Lead Connection Status: 753985
Implantable Lead Connection Status: 753985
Implantable Lead Implant Date: 20170501
Implantable Lead Implant Date: 20170501
Implantable Lead Location: 753859
Implantable Lead Location: 753860
Implantable Pulse Generator Implant Date: 20170501
Lead Channel Impedance Value: 412.5 Ohm
Lead Channel Impedance Value: 550 Ohm
Lead Channel Pacing Threshold Amplitude: 0.75 V
Lead Channel Pacing Threshold Amplitude: 0.75 V
Lead Channel Pacing Threshold Amplitude: 0.75 V
Lead Channel Pacing Threshold Amplitude: 0.75 V
Lead Channel Pacing Threshold Pulse Width: 0.5 ms
Lead Channel Pacing Threshold Pulse Width: 0.5 ms
Lead Channel Pacing Threshold Pulse Width: 0.5 ms
Lead Channel Pacing Threshold Pulse Width: 0.5 ms
Lead Channel Sensing Intrinsic Amplitude: 1.4 mV
Lead Channel Sensing Intrinsic Amplitude: 10.8 mV
Lead Channel Setting Pacing Amplitude: 1.875
Lead Channel Setting Pacing Amplitude: 2.5 V
Lead Channel Setting Pacing Pulse Width: 0.5 ms
Lead Channel Setting Sensing Sensitivity: 2 mV
Pulse Gen Model: 2272
Pulse Gen Serial Number: 7897836

## 2022-01-15 NOTE — Progress Notes (Signed)
Electrophysiology Office Note   Date:  01/15/2022   ID:  Erik, [name] [birth year], MRN [id number]  PCP:  [name], MD  Cardiologist:  [name] Primary Electrophysiologist:  Constance Haw, MD    No chief complaint on file.    History of Present Illness: Erik [name] is a 76 y.o. male who presents today for electrophysiology evaluation.     He has a history significant for atrial fibrillation, nonobstructive coronary artery disease, hypertension, hyperlipidemia, hypothyroidism.  March 2016 he was diagnosed with rapid atrial fibrillation and chest pain.  He was loaded on dofetilide but this was stopped and he went back into atrial fibrillation.  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  He is status post atrial fibrillation ablation 05/17/2017.  He we presented to A-fib clinic 12/04/2021 after being found to be in atrial flutter.  His rates were quite rapid.  He was referred to the emergency room and got a cardioversion while in the emergency room.  He follows up today.  He is currently feeling well.  He has no chest pain or shortness of breath.  He was minimally aware of his episode of atrial flutter.  Today, denies symptoms of palpitations, chest pain, shortness of breath, orthopnea, PND, lower extremity edema, claudication, dizziness, presyncope, syncope, bleeding, or neurologic sequela. The patient is tolerating medications without difficulties.      Past Medical History:  Diagnosis Date   Anxiety    Arthritis    Atypical chest pain 06/26/2015   BPH with urinary obstruction    Cancer (HCC)BASAL CELL SKIN CANCER    Carotid disease, bilateral ([place])    a. 09/2014 Carotid U/S: 1-39% bilat ICA stenosis.   Chronic lower back pain    CKD (chronic kidney disease), stage III ([place])    pt. Erik [name] denies   Diverticulitis 12/2016   Diverticulosis    Dyspnea    WITH EXERTION   GERD (gastroesophageal reflux  disease)    Gout attack    LAST FLARE 08-31-2021 ALL SYMPTOMS RESOLVED   History of kidney stones    Hx of adenomatous colonic polyps 07/2020   2 diminutive   Hyperlipidemia    Hypertension    Hypothyroidism    Internal hemorrhoids    Long QT interval 06/26/2015   Nephrolithiasis    Neuropathy 07/30/2017   TOES ON BOTH FEET   Non-obstructive CAD    a. 07/2002 Cath: LM 20, LAD 86md, LCX 50-634mOM1 4047mCA 54m34m 60%; b. 05/2014 MV: low risk w/ small sized, mild intensity rev defect in apical/inferior/infsept area, nl EF->Med Rx.   Panic attack    NONE RECENT PER WIFE 10-09-2021   Paroxysmal atrial fibrillation (HCC)[place] a. Dx 04/2014; b. 05/2014 Echo: Ef 60-65%, no rwma, triv MR/TR, nl RV;  c. CHA2DS2VASc = 3-->was on eliquis, switched to xarelto 04/2015 2/2 cost; d. 05/2015 Tikosyn loaded w/ conversion to AF; e. 06/2015 QTc prolongation and bradycardia->tikosyn d/c'd, PPM placed, Amio started; f. 07/01/2015 In Aflutter @ clinic f/u.   Paroxysmal atrial flutter (HCC)Erik [name] a. 06/2015 noted to be in rapid Aflutter in Afib clinic-->amio load continued.   Persistent atrial fibrillation (HCC)[place]/25/2017   Presence of permanent cardiac pacemaker    St. Jude   Prolapsed internal hemorrhoids, grade 2-3 10/19/2016   LL Gr 3 and RP/RA Gr 2 LL banded 10/19/2016    RLL pneumonia 11/05/2012   Sleep apnea    "  can't tolerate mask" (05/17/2017)   Tachy-brady syndrome ([place])    a. 06/27/2015 s/p SJM DC PPM (ser # @ [id number]).   Past Surgical History:  Procedure Laterality Date   ATRIAL FIBRILLATION ABLATION N/A 05/17/2017   Procedure: ATRIAL FIBRILLATION ABLATION;  Surgeon: Constance Haw, MD;  Location: [facility];  Service: Cardiovascular;  Laterality: N/A;   CARDIAC CATHETERIZATION  ~ 2000   CLOSED REDUCTION HAND FRACTURE Right 1984   COLONOSCOPY  06/27/2015   per Dr. Henrene Pastor, internal hemorrhoids and diverticulae, repeat 10 yrs   COLONSCOPY     FEB OR MARCH 2023 AT DR Carlean Purl OFFICE    CYSTOSCOPY WITH RETROGRADE PYELOGRAM, URETEROSCOPY AND STENT PLACEMENT Bilateral 02/14/2018   Procedure: CYSTOSCOPY WITH RETROGRADE PYELOGRAM, URETEROSCOPY AND STENT PLACEMENT;  Surgeon: Alexis Frock, MD;  Location: [facility];  Service: Urology;  Laterality: Bilateral;   EP IMPLANTABLE DEVICE N/A 06/27/2015   Procedure: Pacemaker Implant;  Surgeon: Evans Lance, MD;  Location: [facility];  Service: Cardiovascular;  Laterality: N/A;   EYE SURGERY  08/03/2019   Cataract   EYE SURGERY  08/17/2019   Cataact   FRACTURE SURGERY     right hand,finger YRS AGO   HEMORRHOID BANDING     DONE SEVERAL TIMES LAST 4 TO 5 YRS AGOPER WIFE 10-09-2021   HEMORRHOID SURGERY N/A 10/13/2021   Procedure: HEMORRHOIDECTOMY 2 vs 3 COLUMN, DISIMPACTION;  Surgeon: Ileana Roup, MD;  Location: [facility];  Service: General;  Laterality: N/A;   HOLMIUM LASER APPLICATION Bilateral 58/52/7782   Procedure: HOLMIUM LASER APPLICATION;  Surgeon: Alexis Frock, MD;  Location: [facility];  Service: Urology;  Laterality: Bilateral;   LACERATION REPAIR Right 1984   "hand"   POSTERIOR LUMBAR FUSION  1998   2 lumbar discs, Dr. Ellene Route; "ray cages"   RECTAL EXAM UNDER ANESTHESIA N/A 10/13/2021   Procedure: ANORECTAL EXAM UNDER ANESTHESIA;  Surgeon: Ileana Roup, MD;  Location: [facility];  Service: General;  Laterality: N/A;   SKIN CANCER EXCISION  09/2019   TOTAL HIP ARTHROPLASTY Left 09/01/2019   Procedure: LEFT TOTAL HIP ARTHROPLASTY ANTERIOR APPROACH;  Surgeon: Melrose Nakayama, MD;  Location: [facility];  Service: Orthopedics;  Laterality: Left;     Current Outpatient Medications  Medication Sig Dispense Refill   acetaminophen (TYLENOL) 500 MG tablet Take 1,000 mg by mouth every 6 (six) hours as needed for moderate pain.     diltiazem (CARDIZEM CD) 120 MG 24 hr capsule TAKE 1 CAPSULE(120 MG) BY MOUTH DAILY (Patient taking differently: Take 120 mg by mouth daily.) 30 capsule 2    famotidine (PEPCID) 20 MG tablet Take 20 mg by mouth daily as needed (acid reflux).     furosemide (LASIX) 40 MG tablet Take 0.5 tablets (20 mg total) by mouth daily. 90 tablet 3   gabapentin (NEURONTIN) 600 MG tablet TAKE 1 TABLET(600 MG) BY MOUTH THREE TIMES DAILY (Patient taking differently: Take 600 mg by mouth 3 (three) times daily.) 270 tablet 1   levothyroxine (SYNTHROID) 50 MCG tablet TAKE 1 TABLET BY MOUTH EVERY DAY (Patient taking differently: Take 50 mcg by mouth daily before breakfast.) 90 tablet 3   losartan (COZAAR) 100 MG tablet Take 25 mg by mouth daily.     polyethylene glycol (MIRALAX / GLYCOLAX) 17 g packet Take 17 g by mouth 2 (two) times daily. 30 each 0   potassium chloride (KLOR-CON) 10 MEQ tablet TAKE 1 TABLET(10 MEQ) BY MOUTH DAILY (Patient taking  differently: Take 10 mEq by mouth daily.) 90 tablet 3   predniSONE (DELTASONE) 10 MG tablet Take 1 tablet (10 mg total) by mouth daily with breakfast. 90 tablet 3   psyllium (METAMUCIL) 58.6 % powder Take 1 packet by mouth as needed. With water or juice     rivaroxaban (XARELTO) 20 MG TABS tablet TAKE 1 TABLET(20 MG) BY MOUTH DAILY WITH SUPPER 90 tablet 1   simvastatin (ZOCOR) 20 MG tablet TAKE 1 TABLET(20 MG) BY MOUTH DAILY AT 6 PM (Patient taking differently: Take 20 mg by mouth daily at 6 PM.) 90 tablet 3   tamsulosin (FLOMAX) 0.4 MG CAPS capsule Take 2 capsules (0.8 mg total) by mouth daily. 180 capsule 1   triamcinolone (NASACORT ALLERGY 24HR) 55 MCG/ACT AERO nasal inhaler Place 1 spray into the nose at bedtime as needed (nasal congestion).     triamcinolone cream (KENALOG) 0.1 % Apply 1 Application topically daily as needed (itching, rash).     No current facility-administered medications for this visit.    Allergies:   Patient has no known allergies.   Social History:  The patient  reports that he quit smoking about 26 years ago. His smoking use included cigarettes. He has a 56.00 pack-year smoking history. He has  never used smokeless tobacco. He reports current alcohol use of about 1.0 standard drink of alcohol per week. He reports that he does not use drugs.   Family History:  The patient's family history includes Cancer in his paternal aunt; Dementia in his mother; Diabetes in his paternal grandmother; Stroke in his father and paternal aunt.   ROS:  Please see the history of present illness.   Otherwise, review of systems is positive for none.   All other systems are reviewed and negative.   PHYSICAL EXAM: VS:  BP 120/70   Pulse 82   Ht '5\' 9"'$  (1.753 m)   Wt 257 lb 1.3 oz (116.6 kg)   SpO2 92%   BMI 37.96 kg/m  , BMI Body mass index is 37.96 kg/m. GEN: Well nourished, well developed, in no acute distress  HEENT: normal  Neck: no JVD, carotid bruits, or masses Cardiac: RRR; no murmurs, rubs, or gallops,no edema  Respiratory:  clear to auscultation bilaterally, normal work of breathing GI: soft, nontender, nondistended, + BS MS: no deformity or atrophy  Skin: warm and dry, device site well healed Neuro:  Strength and sensation are intact Psych: euthymic mood, full affect  EKG:  EKG is not ordered today. Personal review of the ekg ordered 12/11/21 shows atrial paced  Personal review of the device interrogation today. Results in [facility]: 12/04/2021: ALT 21; B Natriuretic Peptide 268.7; BUN 16; Creatinine, Ser 1.37; Hemoglobin 13.9; Platelets 186; Potassium 3.7; Sodium 140; TSH 0.882    Lipid Panel     Component Value Date/Time   CHOL 165 05/06/2019 1519   TRIG 144.0 05/06/2019 1519   TRIG 203 (HH) 12/10/2005 0839   HDL 52.40 05/06/2019 1519   CHOLHDL 3 05/06/2019 1519   VLDL 28.8 05/06/2019 1519   LDLCALC 84 05/06/2019 1519   LDLDIRECT 104.9 06/09/2009 0919     Wt Readings from Last 3 Encounters:  01/15/22 257 lb 1.3 oz (116.6 kg)  12/11/21 252 lb 3.2 oz (114.4 kg)  12/04/21 255 lb 11.7 oz (116 kg)      Other studies Reviewed: Additional studies/ records that  were reviewed today include: TTE 11/10/15 Review of the above records today demonstrates:  -  Left ventricle: The cavity size was normal. Wall thickness was   increased in a pattern of mild LVH. The estimated ejection   fraction was 55%. Wall motion was normal; there were no regional   wall motion abnormalities. Doppler parameters are consistent with   abnormal left ventricular relaxation (grade 1 diastolic   dysfunction). - Aortic valve: There was no stenosis. - Mitral valve: There was trivial regurgitation. - Left atrium: The atrium was mildly dilated. - Right ventricle: The cavity size was normal. Pacer wire or   catheter noted in right ventricle. Systolic function was normal. - Pulmonary arteries: No complete TR doppler jet so unable to   estimate PA systolic pressure. - Inferior vena cava: The vessel was normal in size. The   respirophasic diameter changes were in the normal range (= 50%),   consistent with normal central venous pressure. - Pericardium, extracardiac: A trivial pericardial effusion was   identified posterior to the heart.  Myoview 06/04/14 Low risk stress nuclear study with a small sized, mild intensity reversible defect concering for mild apical-inferior/inferoseptal ischeima.  Clinical Correlation is recommended.  ASSESSMENT AND PLAN:  1.  Persistent atrial fibrillation: Status post ablation 04/27/2017.  Currently on Xarelto 20 mg daily and diltiazem.  CHA2DS2-VASc of 2.  Has remained in sinus rhythm with only atrial flutter 1 episode since.  We Vernette Moise continue with current management.  2.  Hypertension: Currently well controlled  3.  Sinus node dysfunction: Status post Saint Jude dual-chamber pacemaker.  Device functioning appropriately.  No changes at this time.  4.  Secondary hypercoagulable state: Currently on Xarelto for atrial fibrillation as above  5.  Atrial flutter: Had an episode of atrial flutter 12/04/2021.  He had not not had any arrhythmias over the past  few years and only had this isolated episode.  If he does have more frequent episodes, Renetta Suman potentially plan for ablation.  Current medicines are reviewed at length with the patient today.   The patient does not have concerns regarding his medicines.  The following changes were made today: None  Labs/ tests ordered today include:  Orders Placed This Encounter  Procedures   CUP [place]     Disposition:   FU with Aubrianna Orchard 12 months  Signed, Geselle Cardosa [name], MD  01/15/2022 3:42 PM     [facility] [address] [place] [address] [phone] (office) [phone]

## 2022-01-29 ENCOUNTER — Telehealth: Payer: Self-pay | Admitting: Family Medicine

## 2022-01-29 ENCOUNTER — Other Ambulatory Visit: Payer: Self-pay | Admitting: Family Medicine

## 2022-01-29 NOTE — Telephone Encounter (Signed)
Current prescription of file is Losartan '100mg'$ .

## 2022-01-29 NOTE — Telephone Encounter (Signed)
Pt is on last pill of Losartan.  Dr. Sarajane Jews was going to call in 50 mg tablets of Losartan in October.  Patients wife states they need these to be called in to [facility] at the corner of [address] and [address] in [place].

## 2022-01-29 NOTE — Telephone Encounter (Signed)
Requesting refill of losartan (COZAAR) 50 MG tablet  [id number] - [place], [place] - [address] AT [address] Phone: [phone]  Fax: [phone]    Says pharmacy never filled the prescription

## 2022-01-29 NOTE — Telephone Encounter (Signed)
Duplicate request. See previous message

## 2022-01-30 ENCOUNTER — Other Ambulatory Visit: Payer: Self-pay

## 2022-01-30 DIAGNOSIS — I10 Essential (primary) hypertension: Secondary | ICD-10-CM

## 2022-01-30 MED ORDER — LOSARTAN POTASSIUM 25 MG PO TABS: 25.0000 mg | ORAL_TABLET | Freq: Every day | ORAL | 3 refills | Status: DC

## 2022-01-30 NOTE — Telephone Encounter (Signed)
Per Dr. Sarajane Jews Losartan '25mg'$  sent to [facility] on [address] in [facility].    Called pt wife [name] lvm with information.

## 2022-01-30 NOTE — Telephone Encounter (Signed)
Change the Losartan to 25 mg daily. Call in one year supply

## 2022-02-08 ENCOUNTER — Other Ambulatory Visit: Payer: Self-pay | Admitting: Family Medicine

## 2022-02-14 ENCOUNTER — Other Ambulatory Visit: Payer: Self-pay | Admitting: Family Medicine

## 2022-02-14 NOTE — Telephone Encounter (Addendum)
Last OV-11/30/21 Last refill-01/27/2021-90 tabs, 3 refills  No future OV scheduled.

## 2022-02-20 ENCOUNTER — Other Ambulatory Visit: Payer: Self-pay | Admitting: Family Medicine

## 2022-03-20 ENCOUNTER — Ambulatory Visit: Payer: Medicare HMO | Attending: Cardiology

## 2022-03-20 ENCOUNTER — Telehealth: Payer: Self-pay

## 2022-03-20 DIAGNOSIS — I495 Sick sinus syndrome: Secondary | ICD-10-CM

## 2022-03-20 NOTE — Telephone Encounter (Signed)
Alert received from [facility]:  Device alert for HVR EGM shows AF with RVR, duration 4hrs 39mn, mean HR 156 Presenting AS/VS Hx of PAF, burden <1%, Xarelto, Diltiazem Route to triage for long duration RVR  Outreach made to Pt.  He was unaware of elevated heart rates and asymptomatic.  Discussed with Dr. CCurt Bears  Will continue to monitor.  Pt aware.

## 2022-03-23 LAB — CUP PACEART REMOTE DEVICE CHECK
Battery Remaining Longevity: 46 mo
Battery Remaining Percentage: 39 %
Battery Voltage: 2.96 V
Brady Statistic AP VP Percent: 1 %
Brady Statistic AP VS Percent: 59 %
Brady Statistic AS VP Percent: 1 %
Brady Statistic AS VS Percent: 40 %
Brady Statistic RA Percent Paced: 58 %
Brady Statistic RV Percent Paced: 1 %
Date Time Interrogation Session: 20240126020010
Implantable Lead Connection Status: 753985
Implantable Lead Connection Status: 753985
Implantable Lead Implant Date: 20170501
Implantable Lead Implant Date: 20170501
Implantable Lead Location: 753859
Implantable Lead Location: 753860
Implantable Pulse Generator Implant Date: 20170501
Lead Channel Impedance Value: 410 Ohm
Lead Channel Impedance Value: 530 Ohm
Lead Channel Pacing Threshold Amplitude: 0.75 V
Lead Channel Pacing Threshold Amplitude: 0.875 V
Lead Channel Pacing Threshold Pulse Width: 0.5 ms
Lead Channel Pacing Threshold Pulse Width: 0.5 ms
Lead Channel Sensing Intrinsic Amplitude: 4.7 mV
Lead Channel Sensing Intrinsic Amplitude: 4.8 mV
Lead Channel Setting Pacing Amplitude: 1.875
Lead Channel Setting Pacing Amplitude: 2.5 V
Lead Channel Setting Pacing Pulse Width: 0.5 ms
Lead Channel Setting Sensing Sensitivity: 2 mV
Pulse Gen Model: 2272
Pulse Gen Serial Number: 7897836

## 2022-03-26 ENCOUNTER — Ambulatory Visit: Payer: Medicare HMO

## 2022-03-26 ENCOUNTER — Ambulatory Visit (INDEPENDENT_AMBULATORY_CARE_PROVIDER_SITE_OTHER): Payer: Medicare HMO

## 2022-03-26 VITALS — Ht 69.0 in | Wt 253.0 lb

## 2022-03-26 DIAGNOSIS — Z0000 Encounter for general adult medical examination without abnormal findings: Secondary | ICD-10-CM | POA: Diagnosis not present

## 2022-03-26 NOTE — Patient Instructions (Addendum)
Erik [name] , Thank you for taking time to come for your Medicare Wellness Visit. I appreciate your ongoing commitment to your health goals. Please review the following plan we discussed and let me know if I can assist you in the future.   These are the goals we discussed:  Goals       Patient Stated      Lose 30lbs this year      Patient Stated (pt-stated)      I want lose more weight and to get under 230 pounds        This is a list of the screening recommended for you and due dates:  Health Maintenance  Topic Date Due   DTaP/Tdap/Td vaccine (2 - Tdap) 06/15/2018   COVID-19 Vaccine (7 - 2023-24 season) 04/11/2022*   Medicare Annual Wellness Visit  03/27/2023   Pneumonia Vaccine  Completed   Flu Shot  Completed   Hepatitis C Screening: USPSTF Recommendation to screen - Ages 18-79 yo.  Completed   HPV Vaccine  Aged Out   Colon Cancer Screening  Discontinued   Zoster (Shingles) Vaccine  Discontinued  *Topic was postponed. The date shown is not the original due date.    Advanced directives: Please bring a copy of your health care power of attorney and living will to the office to be added to your chart at your convenience.   Conditions/risks identified: None  Next appointment: Follow up in one year for your annual wellness visit.    Preventive Care 9 Years and Older, Male  Preventive care refers to lifestyle choices and visits with your health care provider that can promote health and wellness. What does preventive care include? A yearly physical exam. This is also called an annual well check. Dental exams once or twice a year. Routine eye exams. Ask your health care provider how often you should have your eyes checked. Personal lifestyle choices, including: Daily care of your teeth and gums. Regular physical activity. Eating a healthy diet. Avoiding tobacco and drug use. Limiting alcohol use. Practicing safe sex. Taking low doses of aspirin every day. Taking vitamin  and mineral supplements as recommended by your health care provider. What happens during an annual well check? The services and screenings done by your health care provider during your annual well check will depend on your age, overall health, lifestyle risk factors, and family history of disease. Counseling  Your health care provider may ask you questions about your: Alcohol use. Tobacco use. Drug use. Emotional well-being. Home and relationship well-being. Sexual activity. Eating habits. History of falls. Memory and ability to understand (cognition). Work and work Statistician. Screening  You may have the following tests or measurements: Height, weight, and BMI. Blood pressure. Lipid and cholesterol levels. These may be checked every 5 years, or more frequently if you are over 64 years old. Skin check. Lung cancer screening. You may have this screening every year starting at age 74 if you have a 30-pack-year history of smoking and currently smoke or have quit within the past 15 years. Fecal occult blood test (FOBT) of the stool. You may have this test every year starting at age 39. Flexible sigmoidoscopy or colonoscopy. You may have a sigmoidoscopy every 5 years or a colonoscopy every 10 years starting at age 43. Prostate cancer screening. Recommendations will vary depending on your family history and other risks. Hepatitis C blood test. Hepatitis B blood test. Sexually transmitted disease (STD) testing. Diabetes screening. This is done by checking  your blood sugar (glucose) after you have not eaten for a while (fasting). You may have this done every 1-3 years. Abdominal aortic aneurysm (AAA) screening. You may need this if you are a current or former smoker. Osteoporosis. You may be screened starting at age 87 if you are at high risk. Talk with your health care provider about your test results, treatment options, and if necessary, the need for more tests. Vaccines  Your health care  provider may recommend certain vaccines, such as: Influenza vaccine. This is recommended every year. Tetanus, diphtheria, and acellular pertussis (Tdap, Td) vaccine. You may need a Td booster every 10 years. Zoster vaccine. You may need this after age 34. Pneumococcal 13-valent conjugate (PCV13) vaccine. One dose is recommended after age 56. Pneumococcal polysaccharide (PPSV23) vaccine. One dose is recommended after age 63. Talk to your health care provider about which screenings and vaccines you need and how often you need them. This information is not intended to replace advice given to you by your health care provider. Make sure you discuss any questions you have with your health care provider. Document Released: 03/11/2015 Document Revised: 11/02/2015 Document Reviewed: 12/14/2014 Elsevier Interactive Patient Education  2017 [place] Prevention in the Home Falls can cause injuries. They can happen to people of all ages. There are many things you can do to make your home safe and to help prevent falls. What can I do on the outside of my home? Regularly fix the edges of walkways and driveways and fix any cracks. Remove anything that might make you trip as you walk through a door, such as a raised step or threshold. Trim any bushes or trees on the path to your home. Use bright outdoor lighting. Clear any walking paths of anything that might make someone trip, such as rocks or tools. Regularly check to see if handrails are loose or broken. Make sure that both sides of any steps have handrails. Any raised decks and porches should have guardrails on the edges. Have any leaves, snow, or ice cleared regularly. Use sand or salt on walking paths during winter. Clean up any spills in your garage right away. This includes oil or grease spills. What can I do in the bathroom? Use night lights. Install grab bars by the toilet and in the tub and shower. Do not use towel bars as grab  bars. Use non-skid mats or decals in the tub or shower. If you need to sit down in the shower, use a plastic, non-slip stool. Keep the floor dry. Clean up any water that spills on the floor as soon as it happens. Remove soap buildup in the tub or shower regularly. Attach bath mats securely with double-sided non-slip rug tape. Do not have throw rugs and other things on the floor that can make you trip. What can I do in the bedroom? Use night lights. Make sure that you have a light by your bed that is easy to reach. Do not use any sheets or blankets that are too big for your bed. They should not hang down onto the floor. Have a firm chair that has side arms. You can use this for support while you get dressed. Do not have throw rugs and other things on the floor that can make you trip. What can I do in the kitchen? Clean up any spills right away. Avoid walking on wet floors. Keep items that you use a lot in easy-to-reach places. If you need to reach something  above you, use a strong step stool that has a grab bar. Keep electrical cords out of the way. Do not use floor polish or wax that makes floors slippery. If you must use wax, use non-skid floor wax. Do not have throw rugs and other things on the floor that can make you trip. What can I do with my stairs? Do not leave any items on the stairs. Make sure that there are handrails on both sides of the stairs and use them. Fix handrails that are broken or loose. Make sure that handrails are as long as the stairways. Check any carpeting to make sure that it is firmly attached to the stairs. Fix any carpet that is loose or worn. Avoid having throw rugs at the top or bottom of the stairs. If you do have throw rugs, attach them to the floor with carpet tape. Make sure that you have a light switch at the top of the stairs and the bottom of the stairs. If you do not have them, ask someone to add them for you. What else can I do to help prevent  falls? Wear shoes that: Do not have high heels. Have rubber bottoms. Are comfortable and fit you well. Are closed at the toe. Do not wear sandals. If you use a stepladder: Make sure that it is fully opened. Do not climb a closed stepladder. Make sure that both sides of the stepladder are locked into place. Ask someone to hold it for you, if possible. Clearly mark and make sure that you can see: Any grab bars or handrails. First and last steps. Where the edge of each step is. Use tools that help you move around (mobility aids) if they are needed. These include: Canes. Walkers. Scooters. Crutches. Turn on the lights when you go into a dark area. Replace any light bulbs as soon as they burn out. Set up your furniture so you have a clear path. Avoid moving your furniture around. If any of your floors are uneven, fix them. If there are any pets around you, be aware of where they are. Review your medicines with your doctor. Some medicines can make you feel dizzy. This can increase your chance of falling. Ask your doctor what other things that you can do to help prevent falls. This information is not intended to replace advice given to you by your health care provider. Make sure you discuss any questions you have with your health care provider. Document Released: 12/09/2008 Document Revised: 07/21/2015 Document Reviewed: 03/19/2014 Elsevier Interactive Patient Education  2017 [employer].

## 2022-03-26 NOTE — Progress Notes (Signed)
Subjective:   Erik [name] is a 77 y.o. male who presents for Medicare Annual/Subsequent preventive examination.  Review of Systems    Virtual Visit via Telephone Note  I connected with  Erik [name] on 03/26/22 at  2:15 PM EST by telephone and verified that I am speaking with the correct person using two identifiers.  Location: Patient: Home Provider: Office Persons participating in the virtual visit: patient/Nurse Health Advisor   I discussed the limitations, risks, security and privacy concerns of performing an evaluation and management service by telephone and the availability of in person appointments. The patient expressed understanding and agreed to proceed.  Interactive audio and video telecommunications were attempted between this nurse and patient, however failed, due to patient having technical difficulties OR patient did not have access to video capability.  We continued and completed visit with audio only.  Some vital signs may be absent or patient reported.   Erik Peaches, LPN  Cardiac Risk Factors include: advanced age (>39mn, >>24women);male gender;hypertension;obesity (BMI >30kg/m2)     Objective:    Today's Vitals   03/26/22 1420  Weight: 253 lb (114.8 kg)  Height: '5\' 9"'$  (1.753 m)   Body mass index is 37.36 kg/m.     03/26/2022    2:27 PM 10/18/2021    8:05 AM 10/13/2021   11:58 AM 03/20/2021    2:32 PM 03/02/2020    1:11 PM 09/01/2019    1:00 PM 09/01/2019   12:15 PM  Advanced Directives  Does Patient Have a Medical Advance Directive? Yes Yes Yes Yes Yes Yes   Type of AParamedicof [place]oultonLiving will Living will;Healthcare Power of [place] CruzLiving will H[place]Living will HHappyLiving will HCoffeevilleLiving will H[place]Living will  Does patient want to make changes to medical advance directive?      No - Patient  declined   Copy of HPortagein Chart? No - copy requested  No - copy requested No - copy requested No - copy requested      Current Medications (verified) Outpatient Encounter Medications as of 03/26/2022  Medication Sig   losartan (COZAAR) 25 MG tablet Take 1 tablet (25 mg total) by mouth daily.   acetaminophen (TYLENOL) 500 MG tablet Take 1,000 mg by mouth every 6 (six) hours as needed for moderate pain.   diltiazem (CARDIZEM CD) 120 MG 24 hr capsule TAKE 1 CAPSULE(120 MG) BY MOUTH DAILY   famotidine (PEPCID) 20 MG tablet Take 20 mg by mouth daily as needed (acid reflux).   furosemide (LASIX) 40 MG tablet Take 0.5 tablets (20 mg total) by mouth daily.   gabapentin (NEURONTIN) 600 MG tablet TAKE 1 TABLET(600 MG) BY MOUTH THREE TIMES DAILY (Patient taking differently: Take 600 mg by mouth 3 (three) times daily.)   levothyroxine (SYNTHROID) 50 MCG tablet TAKE 1 TABLET BY MOUTH EVERY DAY   losartan (COZAAR) 100 MG tablet Take 25 mg by mouth daily.   polyethylene glycol (MIRALAX / GLYCOLAX) 17 g packet Take 17 g by mouth 2 (two) times daily.   potassium chloride (KLOR-CON) 10 MEQ tablet TAKE 1 TABLET(10 MEQ) BY MOUTH DAILY (Patient taking differently: Take 10 mEq by mouth daily.)   predniSONE (DELTASONE) 10 MG tablet TAKE 1 TABLET(10 MG) BY MOUTH DAILY WITH BREAKFAST   psyllium (METAMUCIL) 58.6 % powder Take 1 packet by mouth as needed. With water or juice  rivaroxaban (XARELTO) 20 MG TABS tablet TAKE 1 TABLET(20 MG) BY MOUTH DAILY WITH SUPPER   simvastatin (ZOCOR) 20 MG tablet TAKE 1 TABLET(20 MG) BY MOUTH DAILY AT 6 PM (Patient taking differently: Take 20 mg by mouth daily at 6 PM.)   tamsulosin (FLOMAX) 0.4 MG CAPS capsule Take 2 capsules (0.8 mg total) by mouth daily.   triamcinolone (NASACORT ALLERGY 24HR) 55 MCG/ACT AERO nasal inhaler Place 1 spray into the nose at bedtime as needed (nasal congestion).   triamcinolone cream (KENALOG) 0.1 % Apply 1 Application topically  daily as needed (itching, rash).   No facility-administered encounter medications on file as of 03/26/2022.    Allergies (verified) Patient has no known allergies.   History: Past Medical History:  Diagnosis Date   Anxiety    Arthritis    Atypical chest pain 06/26/2015   BPH with urinary obstruction    Cancer (HCC)BASAL CELL SKIN CANCER    Carotid disease, bilateral ([place])    a. 09/2014 Carotid U/S: 1-39% bilat ICA stenosis.   Chronic lower back pain    CKD (chronic kidney disease), stage III ([place])    pt. Erik [name] denies   Diverticulitis 12/2016   Diverticulosis    Dyspnea    WITH EXERTION   GERD (gastroesophageal reflux disease)    Gout attack    LAST FLARE 08-31-2021 ALL SYMPTOMS RESOLVED   History of kidney stones    Hx of adenomatous colonic polyps 07/2020   2 diminutive   Hyperlipidemia    Hypertension    Hypothyroidism    Internal hemorrhoids    Long QT interval 06/26/2015   Nephrolithiasis    Neuropathy 07/30/2017   TOES ON BOTH FEET   Non-obstructive CAD    a. 07/2002 Cath: LM 20, LAD 60md, LCX 50-63mOM1 4035mCA 51m27m 60%; b. 05/2014 MV: low risk w/ small sized, mild intensity rev defect in apical/inferior/infsept area, nl EF->Med Rx.   Panic attack    NONE RECENT PER WIFE 10-09-2021   Paroxysmal atrial fibrillation (HCC)[place] a. Dx 04/2014; b. 05/2014 Echo: Ef 60-65%, no rwma, triv MR/TR, nl RV;  c. CHA2DS2VASc = 3-->was on eliquis, switched to xarelto 04/2015 2/2 cost; d. 05/2015 Tikosyn loaded w/ conversion to AF; e. 06/2015 QTc prolongation and bradycardia->tikosyn d/c'd, PPM placed, Amio started; f. 07/01/2015 In Aflutter @ clinic f/u.   Paroxysmal atrial flutter (HCC)[place] a. 06/2015 noted to be in rapid Aflutter in Afib clinic-->amio load continued.   Persistent atrial fibrillation (HCC)[place]/25/2017   Presence of permanent cardiac pacemaker    St. 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 06/27/2015 s/p SJM DC PPM (ser # @ [id number] Past Surgical History:  Procedure Laterality Date   ATRIAL FIBRILLATION ABLATION N/A 05/17/2017   Procedure: ATRIAL FIBRILLATION ABLATION;  Surgeon: CamnConstance [name];  [facility];  Service: Cardiovascular;  Laterality: N/A;   CARDIAC CATHETERIZATION  ~ 2000   CLOSED REDUCTION HAND FRACTURE Right 1984   COLONOSCOPY  06/27/2015   per Dr. PerrHenrene Pastorternal hemorrhoids and diverticulae, repeat 10 yrs   COLONSCOPY     FEB OR MARCH 2023 AT DR [facility]   CYSTOSCOPY WITH RETROGRADE PYELOGRAM, URETEROSCOPY AND STENT PLACEMENT Bilateral  02/14/2018   Procedure: CYSTOSCOPY WITH RETROGRADE PYELOGRAM, URETEROSCOPY AND STENT PLACEMENT;  Surgeon: Alexis Frock, MD;  Location: [facility];  Service: Urology;  Laterality: Bilateral;   EP IMPLANTABLE DEVICE N/A 06/27/2015   Procedure: Pacemaker Implant;  Surgeon: Evans Lance, MD;  Location: [facility];  Service: Cardiovascular;  Laterality: N/A;   EYE SURGERY  08/03/2019   Cataract   EYE SURGERY  08/17/2019   Cataact   FRACTURE SURGERY     right hand,finger YRS AGO   HEMORRHOID BANDING     DONE SEVERAL TIMES LAST 4 TO 5 YRS AGOPER WIFE 10-09-2021   HEMORRHOID SURGERY N/A 10/13/2021   Procedure: HEMORRHOIDECTOMY 2 vs 3 COLUMN, DISIMPACTION;  Surgeon: Ileana Roup, MD;  Location: [facility];  Service: General;  Laterality: N/A;   HOLMIUM LASER APPLICATION Bilateral 09/60/4540   Procedure: HOLMIUM LASER APPLICATION;  Surgeon: Alexis Frock, MD;  Location[facility];  Service: Urology;  Laterality: Bilateral;   LACERATION REPAIR Right 1984   "hand"   POSTERIOR LUMBAR FUSION  1998   2 lumbar discs, Dr. Ellene Route; "ray cages"   RECTAL EXAM UNDER ANESTHESIA N/A 10/13/2021   Procedure: ANORECTAL EXAM UNDER ANESTHESIA;  Surgeon: Ileana Roup, MD;   Location: [facility];  Service: General;  Laterality: N/A;   SKIN CANCER EXCISION  09/2019   TOTAL HIP ARTHROPLASTY Left 09/01/2019   Procedure: LEFT TOTAL HIP ARTHROPLASTY ANTERIOR APPROACH;  Surgeon: Melrose Nakayama, MD;  [facility];  Service: Orthopedics;  Laterality: Left;   Family History  Problem Relation Age of Onset   Dementia Mother    Stroke Father    Diabetes Paternal Grandmother    Stroke Paternal Aunt        x 2   Cancer Paternal Aunt        type unknown   Colon cancer Neg Hx    Stomach cancer Neg Hx    Social History   Socioeconomic History   Marital status: Married    Spouse name: Not on file   Number of children: 1   Years of education: Not on file   Highest education level: Not on file  Occupational History   Occupation: retired  Tobacco Use   Smoking status: Former    Packs/day: 2.00    Years: 28.00    Total pack years: 56.00    Types: Cigarettes    Quit date: 02/27/1995    Years since quitting: 27.0   Smokeless tobacco: Never  Vaping Use   Vaping Use: Never used  Substance and Sexual Activity   Alcohol use: Yes    Alcohol/week: 1.0 standard drink of alcohol    Types: 1 Cans of beer per week    Comment: couple times a month   Drug use: No   Sexual activity: Not Currently    Partners: Female  Other Topics Concern   Not on file  Social History Narrative   He is married and retired he has 1 child   Social Determinants of Radio broadcast assistant Strain: Low Risk  (03/26/2022)   Overall Financial Resource Strain (CARDIA)    Difficulty of Paying Living Expenses: Not hard at all  Food Insecurity: No Food Insecurity (03/26/2022)   Hunger Vital Sign    Worried About Running Out of Food in the Last Year: Never true    [place] in the Last Year: Never true  Transportation Needs: No Transportation Needs (03/26/2022)   PRAPARE -  Hydrologist (Medical): No    Lack of Transportation  (Non-Medical): No  Physical Activity: Inactive (03/26/2022)   Exercise Vital Sign    Days of Exercise per Week: 0 days    Minutes of Exercise per Session: 0 min  Stress: No Stress Concern Present (03/26/2022)   [place]    Feeling of Stress : Not at all  Social Connections: Moderately Isolated (03/26/2022)   Social Connection and Isolation Panel [NHANES]    Frequency of Communication with Friends and Family: More than three times a week    Frequency of Social Gatherings with Friends and Family: More than three times a week    Attends Religious Services: Never    Marine scientist or Organizations: No    Attends Music therapist: Never    Marital Status: Married    Tobacco Counseling Counseling given: Not Answered   Clinical Intake:  Pre-visit preparation completed: No  Pain : No/denies pain     BMI - recorded: 37.36 Nutritional Status: BMI > 30  Obese Nutritional Risks: None  How often do you need to have someone help you when you read instructions, pamphlets, or other written materials from your doctor or pharmacy?: 1 - Never  Diabetic?  No  Interpreter Needed?: No  Information entered by :: Rolene Arbour LPN   Activities of Daily Living    03/26/2022    2:25 PM 10/13/2021   12:08 PM  In your present state of health, do you have any difficulty performing the following activities:  Hearing? 0 1  Vision? 0 0  Difficulty concentrating or making decisions? 0 0  Walking or climbing stairs? 0 0  Dressing or bathing? 0 0  Doing errands, shopping? 0   Preparing Food and eating ? N   Using the Toilet? N   In the past six months, have you accidently leaked urine? N   Do you have problems with loss of bowel control? N   Managing your Medications? N   Managing your Finances? N   Housekeeping or managing your Housekeeping? N     Patient Care Team: [name], MD as PCP - General  (Family Medicine) Erik Haw, MD as PCP - Electrophysiology (Cardiology) Lorretta Harp, MD as PCP - Cardiology (Cardiology) Viona Gilmore, [facility] (Inactive) as Pharmacist (Pharmacist)  Indicate any recent Medical Services you may have received from other than [facility] providers in the past year (date may be approximate).     Assessment:   This is a routine wellness examination for [facility].  Hearing/Vision screen Hearing Screening - Comments:: Denies hearing difficulties   Vision Screening - Comments:: Wears rx glasses - up to date with routine eye exams with  Dr Bing Plume  Dietary issues and exercise activities discussed: Current Exercise Habits: The patient does not participate in regular exercise at present, Exercise limited by: None identified   Goals Addressed               This Visit's Progress     Patient Stated (pt-stated)        I want lose more weight and to get under 230 pounds       Depression Screen    03/26/2022    2:24 PM 11/30/2021   11:16 AM 08/31/2021    9:21 AM 06/21/2021    9:53 AM 03/20/2021    2:33 PM 01/27/2021    5:00 PM 03/02/2020  1:09 PM  PHQ 2/9 Scores  PHQ - 2 Score 0 2 2 0 0 0 0  PHQ- 9 Score  '13 4 5  2     '$ Fall Risk    03/26/2022    2:26 PM 11/30/2021   11:15 AM 08/31/2021    9:21 AM 06/21/2021    9:52 AM 03/20/2021    2:32 PM  Fall Risk   Falls in the past year? 0 0 0 0 0  Number falls in past yr: 0 0 0 0   Injury with Fall? 0 0 0 0   Risk for fall due to : No Fall Risks No Fall Risks No Fall Risks No Fall Risks Impaired balance/gait;Medication side effect  Follow up Falls prevention discussed Falls evaluation completed Falls evaluation completed Falls evaluation completed Falls evaluation completed;Education provided;Falls prevention discussed    FALL RISK PREVENTION PERTAINING TO THE HOME:  Any stairs in or around the home? Yes  If so, are there any without handrails? No  Home free of loose throw rugs in walkways, pet beds,  electrical cords, etc? Yes  Adequate lighting in your home to reduce risk of falls? Yes   ASSISTIVE DEVICES UTILIZED TO PREVENT FALLS:  Life alert? No  Use of a cane, walker or w/c? No  Grab bars in the bathroom? No  Shower chair or bench in shower? Yes Elevated toilet seat or a handicapped toilet? Yes  TIMED UP AND GO:  Was the test performed? No . Audio Visit   Cognitive Function:        03/26/2022    2:27 PM 03/20/2021    2:35 PM 03/02/2020    1:15 PM  6CIT Screen  What Year? 0 points 0 points 0 points  What month? 0 points 0 points 0 points  What time? 0 points 0 points   Count back from 20 0 points 0 points 0 points  Months in reverse 0 points 0 points 0 points  Repeat phrase 0 points 0 points 0 points  Total Score 0 points 0 points     Immunizations Immunization History  Administered Date(s) Administered   Influenza, High Dose Seasonal PF 02/15/2017, 12/10/2017, 12/07/2018   Influenza,inj,Quad PF,6+ Mos 11/16/2015   Influenza-Unspecified 11/23/2019, 12/02/2020, 11/16/2021   PFIZER(Purple Top)SARS-COV-2 Vaccination 03/23/2019, 04/13/2019, 11/26/2019, 06/17/2020, 11/16/2021   Pfizer Covid-19 Vaccine Bivalent Booster 98yr & up 11/23/2020   Pneumococcal Conjugate-13 03/03/2018   Pneumococcal Polysaccharide-23 05/06/2019   Td 06/14/2008   Zoster Recombinat (Shingrix) 07/11/2017   Zoster, Live 11/19/2011    TDAP status: Due, Education has been provided regarding the importance of this vaccine. Advised may receive this vaccine at local pharmacy or Health Dept. Aware to provide a copy of the vaccination record if obtained from local pharmacy or Health Dept. Verbalized acceptance and understanding.  Flu Vaccine status: Up to date  Pneumococcal vaccine status: Up to date  Covid-19 vaccine status: Completed vaccines  Qualifies for Shingles Vaccine? Yes   Zostavax completed Yes   Shingrix Completed?: Yes  Screening Tests Health Maintenance  Topic Date Due    DTaP/Tdap/Td (2 - Tdap) 06/15/2018   COVID-19 Vaccine (7 - 2023-24 season) 04/11/2022 (Originally 01/11/2022)   Medicare Annual Wellness (AWV)  03/27/2023   Pneumonia Vaccine 75 Years old  Completed   INFLUENZA VACCINE  Completed   Hepatitis C Screening  Completed   HPV VACCINES  Aged Out   COLONOSCOPY (Pts 45-437yrInsurance coverage will need to be confirmed)  Discontinued   Zoster Vaccines-  Shingrix  Discontinued    Health Maintenance  Health Maintenance Due  Topic Date Due   DTaP/Tdap/Td (2 - Tdap) 06/15/2018    Colorectal cancer screening: No longer required.   Lung Cancer Screening: (Low Dose CT Chest recommended if Age 42-80 years, 30 pack-year currently smoking OR have quit w/in 15years.) does not qualify.    Additional Screening:  Hepatitis C Screening: does qualify; Completed 04/1019  Vision Screening: Recommended annual ophthalmology exams for early detection of glaucoma and other disorders of the eye. Is the patient up to date with their annual eye exam?  Yes  Who is the provider or what is the name of the office in which the patient attends annual eye exams? Dr Bing Plume If pt is not established with a provider, would they like to be referred to a provider to establish care? No .   Dental Screening: Recommended annual dental exams for proper oral hygiene  Community Resource Referral / Chronic Care Management:  CRR required this visit?  No   CCM required this visit?  No      Plan:     I have personally reviewed and noted the following in the patient's chart:   Medical and social history Use of alcohol, tobacco or illicit drugs  Current medications and supplements including opioid prescriptions. Patient is not currently taking opioid prescriptions. Functional ability and status Nutritional status Physical activity Advanced directives List of other physicians Hospitalizations, surgeries, and ER visits in previous 12 months Vitals Screenings to include  cognitive, depression, and falls Referrals and appointments  In addition, I have reviewed and discussed with patient certain preventive protocols, quality metrics, and best practice recommendations. A written personalized care plan for preventive services as well as general preventive health recommendations were provided to patient.     Erik Peaches, LPN   04/08/1550   Nurse Notes: None

## 2022-04-01 ENCOUNTER — Other Ambulatory Visit: Payer: Self-pay | Admitting: Family Medicine

## 2022-04-05 ENCOUNTER — Encounter (HOSPITAL_COMMUNITY): Payer: Self-pay | Admitting: *Deleted

## 2022-04-17 NOTE — Progress Notes (Signed)
Remote pacemaker transmission.   

## 2022-04-18 ENCOUNTER — Other Ambulatory Visit: Payer: Self-pay | Admitting: Family Medicine

## 2022-04-18 ENCOUNTER — Other Ambulatory Visit: Payer: Self-pay

## 2022-05-06 ENCOUNTER — Other Ambulatory Visit: Payer: Self-pay | Admitting: Family Medicine

## 2022-06-19 ENCOUNTER — Ambulatory Visit (INDEPENDENT_AMBULATORY_CARE_PROVIDER_SITE_OTHER): Payer: Medicare HMO

## 2022-06-19 DIAGNOSIS — I495 Sick sinus syndrome: Secondary | ICD-10-CM | POA: Diagnosis not present

## 2022-06-21 LAB — CUP PACEART REMOTE DEVICE CHECK
Battery Remaining Longevity: 43 mo
Battery Remaining Percentage: 37 %
Battery Voltage: 2.96 V
Brady Statistic AP VP Percent: 1 %
Brady Statistic AP VS Percent: 67 %
Brady Statistic AS VP Percent: 1 %
Brady Statistic AS VS Percent: 33 %
Brady Statistic RA Percent Paced: 66 %
Brady Statistic RV Percent Paced: 1 %
Date Time Interrogation Session: 20240425033928
Implantable Lead Connection Status: 753985
Implantable Lead Connection Status: 753985
Implantable Lead Implant Date: 20170501
Implantable Lead Implant Date: 20170501
Implantable Lead Location: 753859
Implantable Lead Location: 753860
Implantable Pulse Generator Implant Date: 20170501
Lead Channel Impedance Value: 410 Ohm
Lead Channel Impedance Value: 550 Ohm
Lead Channel Pacing Threshold Amplitude: 0.75 V
Lead Channel Pacing Threshold Amplitude: 0.75 V
Lead Channel Pacing Threshold Pulse Width: 0.5 ms
Lead Channel Pacing Threshold Pulse Width: 0.5 ms
Lead Channel Sensing Intrinsic Amplitude: 5 mV
Lead Channel Sensing Intrinsic Amplitude: 7.4 mV
Lead Channel Setting Pacing Amplitude: 1.75 V
Lead Channel Setting Pacing Amplitude: 2.5 V
Lead Channel Setting Pacing Pulse Width: 0.5 ms
Lead Channel Setting Sensing Sensitivity: 2 mV
Pulse Gen Model: 2272
Pulse Gen Serial Number: 7897836

## 2022-07-02 ENCOUNTER — Telehealth: Payer: Self-pay | Admitting: Family Medicine

## 2022-07-02 MED ORDER — RIVAROXABAN 20 MG PO TABS: ORAL_TABLET | ORAL | 3 refills | Status: DC

## 2022-07-02 MED ORDER — TAMSULOSIN HCL 0.4 MG PO CAPS: ORAL_CAPSULE | ORAL | 0 refills | Status: DC

## 2022-07-02 MED ORDER — GABAPENTIN 600 MG PO TABS: ORAL_TABLET | ORAL | 0 refills | Status: DC

## 2022-07-02 MED ORDER — FUROSEMIDE 40 MG PO TABS: ORAL_TABLET | ORAL | 3 refills | Status: DC

## 2022-07-02 NOTE — Telephone Encounter (Signed)
Noted  

## 2022-07-02 NOTE — Telephone Encounter (Signed)
Done

## 2022-07-02 NOTE — Telephone Encounter (Signed)
Prescription Request  07/02/2022  LOV: 11/30/2021  What is the name of the medication or equipment?  gabapentin (NEURONTIN) 600 MG tablet  tamsulosin (FLOMAX) 0.4 MG CAPS capsule   rivaroxaban (XARELTO) 20 MG TABS tablet  furosemide (LASIX) 40 MG tablet  Have you contacted your pharmacy to request a refill? Yes they are moving pharmacies and [facility] needs new Rx sent to them.  Which pharmacy would you like this sent to?  [facility] [address] - [place], [place] - [address], [address] [place] [address] Phone: [phone] Fax: [phone]  Patient notified that their request is being sent to the clinical staff for review and that they should receive a response within 3 business days.   Please advise at Mobile [phone] (mobile)

## 2022-07-08 ENCOUNTER — Other Ambulatory Visit: Payer: Self-pay | Admitting: Family Medicine

## 2022-07-09 ENCOUNTER — Ambulatory Visit: Payer: Medicare HMO | Admitting: Family Medicine

## 2022-07-11 ENCOUNTER — Ambulatory Visit (INDEPENDENT_AMBULATORY_CARE_PROVIDER_SITE_OTHER): Payer: Medicare HMO

## 2022-07-11 ENCOUNTER — Ambulatory Visit (INDEPENDENT_AMBULATORY_CARE_PROVIDER_SITE_OTHER): Payer: Medicare HMO | Admitting: Family Medicine

## 2022-07-11 VITALS — BP 110/68 | HR 61 | Temp 97.8°F | Wt 264.0 lb

## 2022-07-11 DIAGNOSIS — R059 Cough, unspecified: Secondary | ICD-10-CM | POA: Diagnosis not present

## 2022-07-11 DIAGNOSIS — J40 Bronchitis, not specified as acute or chronic: Secondary | ICD-10-CM | POA: Diagnosis not present

## 2022-07-11 LAB — POC COVID19 BINAXNOW: SARS Coronavirus 2 Ag: NEGATIVE

## 2022-07-11 MED ORDER — METHYLPREDNISOLONE ACETATE 40 MG/ML IJ SUSP
40.0000 mg | Freq: Once | INTRAMUSCULAR | Status: AC
Start: 2022-07-11 — End: 2022-07-11
  Administered 2022-07-11: 40 mg via INTRAMUSCULAR

## 2022-07-11 MED ORDER — METHYLPREDNISOLONE ACETATE 80 MG/ML IJ SUSP
80.0000 mg | Freq: Once | INTRAMUSCULAR | Status: AC
Start: 2022-07-11 — End: 2022-07-11
  Administered 2022-07-11: 80 mg via INTRAMUSCULAR

## 2022-07-11 MED ORDER — AMOXICILLIN-POT CLAVULANATE 875-125 MG PO TABS: 1.0000 | ORAL_TABLET | Freq: Two times a day (BID) | ORAL | 0 refills | Status: DC

## 2022-07-11 NOTE — Addendum Note (Signed)
Addended by: Carola Rhine on: 07/11/2022 11:24 AM   Modules accepted: Orders

## 2022-07-11 NOTE — Progress Notes (Signed)
   Subjective:    Patient ID: Erik [name], male    DOB: Apr 14, 1945, 77 y.o.   MRN: [id number]  HPI Here for 5 days of chest tightness, coughing up green sputum, and wheezing. No fever or SOB.    Review of Systems  Constitutional: Negative.   HENT: Negative.    Eyes: Negative.   Respiratory:  Positive for cough, chest tightness and wheezing. Negative for shortness of breath.        Objective:   Physical Exam Constitutional:      Appearance: Normal appearance. He is not ill-appearing.  HENT:     Right Ear: Tympanic membrane, ear canal and external ear normal.     Left Ear: Tympanic membrane, ear canal and external ear normal.     Nose: Nose normal.     Mouth/Throat:     Pharynx: Oropharynx is clear.  Eyes:     Conjunctiva/sclera: Conjunctivae normal.  Pulmonary:     Effort: No respiratory distress.     Breath sounds: Wheezing and rhonchi present. No rales.  Lymphadenopathy:     Cervical: No cervical adenopathy.  Neurological:     Mental Status: He is alert.           Assessment & Plan:  Bronchitis, treat with 10 days of Augmentin and a shot of DepoMedrol. Get a CXR today. Gershon Crane, MD

## 2022-07-17 NOTE — Progress Notes (Signed)
Remote pacemaker transmission.   

## 2022-08-04 ENCOUNTER — Other Ambulatory Visit: Payer: Self-pay | Admitting: Family Medicine

## 2022-09-12 ENCOUNTER — Other Ambulatory Visit: Payer: Self-pay

## 2022-09-12 MED ORDER — POTASSIUM CHLORIDE ER 10 MEQ PO TBCR: 10.0000 meq | EXTENDED_RELEASE_TABLET | Freq: Every day | ORAL | 1 refills | Status: DC

## 2022-09-12 MED ORDER — SIMVASTATIN 20 MG PO TABS: 20.0000 mg | ORAL_TABLET | Freq: Every day | ORAL | 1 refills | Status: DC

## 2022-09-18 ENCOUNTER — Ambulatory Visit: Payer: Medicare HMO

## 2022-09-18 DIAGNOSIS — I495 Sick sinus syndrome: Secondary | ICD-10-CM | POA: Diagnosis not present

## 2022-09-25 ENCOUNTER — Other Ambulatory Visit: Payer: Self-pay | Admitting: Family Medicine

## 2022-10-05 NOTE — Progress Notes (Signed)
Remote pacemaker transmission.   

## 2022-10-07 ENCOUNTER — Other Ambulatory Visit: Payer: Self-pay | Admitting: Family Medicine

## 2022-10-12 ENCOUNTER — Encounter: Payer: Self-pay | Admitting: Family Medicine

## 2022-10-12 ENCOUNTER — Ambulatory Visit (INDEPENDENT_AMBULATORY_CARE_PROVIDER_SITE_OTHER): Payer: Medicare HMO | Admitting: Family Medicine

## 2022-10-12 VITALS — BP 96/58 | HR 62 | Temp 98.3°F | Wt 251.0 lb

## 2022-10-12 DIAGNOSIS — N1832 Chronic kidney disease, stage 3b: Secondary | ICD-10-CM

## 2022-10-12 DIAGNOSIS — R739 Hyperglycemia, unspecified: Secondary | ICD-10-CM

## 2022-10-12 DIAGNOSIS — E876 Hypokalemia: Secondary | ICD-10-CM

## 2022-10-12 DIAGNOSIS — G629 Polyneuropathy, unspecified: Secondary | ICD-10-CM | POA: Diagnosis not present

## 2022-10-12 DIAGNOSIS — J069 Acute upper respiratory infection, unspecified: Secondary | ICD-10-CM

## 2022-10-12 DIAGNOSIS — E538 Deficiency of other specified B group vitamins: Secondary | ICD-10-CM

## 2022-10-12 DIAGNOSIS — R5383 Other fatigue: Secondary | ICD-10-CM

## 2022-10-12 LAB — CBC WITH DIFFERENTIAL/PLATELET
Basophils Absolute: 0 10*3/uL (ref 0.0–0.1)
Basophils Relative: 0.5 % (ref 0.0–3.0)
Eosinophils Absolute: 0.2 10*3/uL (ref 0.0–0.7)
Eosinophils Relative: 2.1 % (ref 0.0–5.0)
HCT: 42.4 % (ref 39.0–52.0)
Hemoglobin: 14.1 g/dL (ref 13.0–17.0)
Lymphocytes Relative: 15.4 % (ref 12.0–46.0)
Lymphs Abs: 1.4 10*3/uL (ref 0.7–4.0)
MCHC: 33.4 g/dL (ref 30.0–36.0)
MCV: 89.2 fl (ref 78.0–100.0)
Monocytes Absolute: 0.9 10*3/uL (ref 0.1–1.0)
Monocytes Relative: 9.7 % (ref 3.0–12.0)
Neutro Abs: 6.7 10*3/uL (ref 1.4–7.7)
Neutrophils Relative %: 72.3 % (ref 43.0–77.0)
Platelets: 200 10*3/uL (ref 150.0–400.0)
RBC: 4.75 Mil/uL (ref 4.22–5.81)
RDW: 14.3 % (ref 11.5–15.5)
WBC: 9.3 10*3/uL (ref 4.0–10.5)

## 2022-10-12 LAB — HEPATIC FUNCTION PANEL
ALT: 26 U/L (ref 0–53)
AST: 17 U/L (ref 0–37)
Albumin: 4.1 g/dL (ref 3.5–5.2)
Alkaline Phosphatase: 41 U/L (ref 39–117)
Bilirubin, Direct: 0.1 mg/dL (ref 0.0–0.3)
Total Bilirubin: 0.6 mg/dL (ref 0.2–1.2)
Total Protein: 6.4 g/dL (ref 6.0–8.3)

## 2022-10-12 LAB — BASIC METABOLIC PANEL
BUN: 23 mg/dL (ref 6–23)
CO2: 29 meq/L (ref 19–32)
Calcium: 9.5 mg/dL (ref 8.4–10.5)
Chloride: 102 meq/L (ref 96–112)
Creatinine, Ser: 1.3 mg/dL (ref 0.40–1.50)
GFR: 53.7 mL/min — ABNORMAL LOW (ref >60.00)
Glucose, Bld: 82 mg/dL (ref 70–99)
Potassium: 3.3 meq/L — ABNORMAL LOW (ref 3.5–5.1)
Sodium: 139 meq/L (ref 135–145)

## 2022-10-12 LAB — VITAMIN B12: Vitamin B-12: 263 pg/mL (ref 211–911)

## 2022-10-12 LAB — TSH: TSH: 1.89 u[IU]/mL (ref 0.35–5.50)

## 2022-10-12 NOTE — Progress Notes (Signed)
   Subjective:    Patient ID: Erik [name], male    DOB: 1945-06-09, 77 y.o.   MRN: [id number]  HPI Here for several issues. First he says the neuropathy in his feet suddenly got much worse last week, and the pain even radiated up to the right knee. He then realized that he had run out of Gabapentin for 5 days. We sent in a refill, and he says the pain almost "went away" within a few hours of taking the first Gabapentin pill. The neuropathy has been controlled since then. Second he has had a dry cough for several days. No SOB or fever. He has tested negative for the Covid virus twice now. Third he complains of constant fatigue for the past few months. His appetite is good, and he sleeps well.    Review of Systems  Constitutional:  Positive for fatigue. Negative for fever.  HENT: Negative.    Eyes: Negative.   Respiratory:  Positive for cough. Negative for shortness of breath and wheezing.   Cardiovascular: Negative.   Gastrointestinal: Negative.   Genitourinary: Negative.   Musculoskeletal:  Positive for myalgias.       Objective:   Physical Exam Constitutional:      Appearance: Normal appearance. He is not ill-appearing.  HENT:     Right Ear: Tympanic membrane, ear canal and external ear normal.     Left Ear: Tympanic membrane, ear canal and external ear normal.     Nose: Nose normal.     Mouth/Throat:     Pharynx: Oropharynx is clear.  Eyes:     Conjunctiva/sclera: Conjunctivae normal.  Cardiovascular:     Rate and Rhythm: Normal rate and regular rhythm.     Pulses: Normal pulses.     Heart sounds: Normal heart sounds.  Pulmonary:     Effort: Pulmonary effort is normal.     Breath sounds: Normal breath sounds.  Lymphadenopathy:     Cervical: No cervical adenopathy.  Neurological:     Mental Status: He is alert.           Assessment & Plan:  He has a viral URI, and this should resolve in the next few days. He will drink fluids and use Delsym as needed. His recent  neuropathy flare proves that the Gabapentin is working, so I urged him to avoid ever letting his supply run out. For the fatigue, we will get labs to day to check his renal function, etc.  Gershon Crane, MD

## 2022-10-15 LAB — HEMOGLOBIN A1C: Hgb A1c MFr Bld: 5.4 % (ref 4.6–6.5)

## 2022-10-16 NOTE — Addendum Note (Signed)
Addended by: Johnella Moloney on: 10/16/2022 11:25 AM   Modules accepted: Orders

## 2022-10-22 ENCOUNTER — Telehealth: Payer: Self-pay | Admitting: Family Medicine

## 2022-10-22 NOTE — Telephone Encounter (Signed)
Prescription Request  10/22/2022  LOV: 10/12/2022  What is the name of the medication or equipment?  potassium chloride  potassium chloride (KLOR-CON) 10 MEQ tablet   (Pt has been taking this Rx twice a day and is almost out)  Have you contacted your pharmacy to request a refill? Yes   Which pharmacy would you like this sent to?   [facility] [address] - [place], [place] - [address] Phone: [phone]  Fax: [phone]       Patient notified that their request is being sent to the clinical staff for review and that they should receive a response within 2 business days.   Please advise at Mobile [phone] (mobile)

## 2022-10-25 ENCOUNTER — Other Ambulatory Visit: Payer: Self-pay

## 2022-10-25 MED ORDER — POTASSIUM CHLORIDE ER 10 MEQ PO TBCR: 10.0000 meq | EXTENDED_RELEASE_TABLET | Freq: Every day | ORAL | 1 refills | Status: DC

## 2022-10-25 NOTE — Telephone Encounter (Signed)
Done

## 2022-10-31 ENCOUNTER — Encounter: Payer: Self-pay | Admitting: Family Medicine

## 2022-10-31 ENCOUNTER — Ambulatory Visit (INDEPENDENT_AMBULATORY_CARE_PROVIDER_SITE_OTHER): Payer: Medicare HMO | Admitting: Family Medicine

## 2022-10-31 VITALS — BP 100/60 | HR 71 | Temp 98.0°F | Wt 252.0 lb

## 2022-10-31 DIAGNOSIS — I10 Essential (primary) hypertension: Secondary | ICD-10-CM

## 2022-10-31 DIAGNOSIS — M1611 Unilateral primary osteoarthritis, right hip: Secondary | ICD-10-CM

## 2022-10-31 DIAGNOSIS — M5412 Radiculopathy, cervical region: Secondary | ICD-10-CM

## 2022-10-31 MED ORDER — TRAMADOL HCL 50 MG PO TABS: 100.0000 mg | ORAL_TABLET | Freq: Four times a day (QID) | ORAL | 2 refills | Status: DC | PRN

## 2022-10-31 MED ORDER — DILTIAZEM HCL ER COATED BEADS 120 MG PO CP24: 120.0000 mg | ORAL_CAPSULE | Freq: Every day | ORAL | 3 refills | Status: DC

## 2022-10-31 MED ORDER — LOSARTAN POTASSIUM 25 MG PO TABS: 25.0000 mg | ORAL_TABLET | Freq: Every day | ORAL | 3 refills | Status: DC

## 2022-10-31 MED ORDER — POTASSIUM CHLORIDE ER 10 MEQ PO TBCR: 20.0000 meq | EXTENDED_RELEASE_TABLET | Freq: Every day | ORAL | 3 refills | Status: DC

## 2022-10-31 NOTE — Progress Notes (Signed)
   Subjective:    Patient ID: Erik [name], male    DOB: February 21, 1946, 77 y.o.   MRN: [id number]  HPI Here for chronic right hip pain and for 2 weeks of sharp pains that start in the right side of his neck and they radiate through the right shoulder to the upper arm. The pains come and go. No numbness or weakness in the arm. He gets relief by taking a Tramadol. No recent trauma.    Review of Systems  Constitutional: Negative.   Respiratory: Negative.    Cardiovascular: Negative.   Musculoskeletal:  Positive for arthralgias and neck pain.       Objective:   Physical Exam Constitutional:      General: He is not in acute distress.    Appearance: Normal appearance.  Cardiovascular:     Rate and Rhythm: Normal rate and regular rhythm.     Pulses: Normal pulses.     Heart sounds: Normal heart sounds.  Pulmonary:     Effort: Pulmonary effort is normal.     Breath sounds: Normal breath sounds.  Musculoskeletal:     Comments: He is mildly tender on the right side of th posterior neck. The neck has full ROM but he has some pain with full flexion and extension. The right shoulder is normal with no tenderness and full ROM   Neurological:     Mental Status: He is alert.           Assessment & Plan:  He has been seeing Dr. Margreta Journey for his hip pain. He had a replacement surgery on the left hip 2 years ago, and he may need this on the right side now. I advised him to follow up with Dr. Margreta Journey. He can use Tramadol as needed for pain. The right neck and shoulder pain are consistent with a cervical radiculopathy. We will set him up for a cervical spine MRI.  Gershon Crane, MD

## 2022-11-14 ENCOUNTER — Other Ambulatory Visit: Payer: Self-pay | Admitting: Family Medicine

## 2022-11-16 ENCOUNTER — Other Ambulatory Visit: Payer: Medicare HMO

## 2022-11-16 DIAGNOSIS — E876 Hypokalemia: Secondary | ICD-10-CM

## 2022-11-16 LAB — BASIC METABOLIC PANEL
BUN: 18 mg/dL (ref 6–23)
CO2: 29 mEq/L (ref 19–32)
Calcium: 9.3 mg/dL (ref 8.4–10.5)
Chloride: 106 mEq/L (ref 96–112)
Creatinine, Ser: 1.31 mg/dL (ref 0.40–1.50)
GFR: 53.17 mL/min — ABNORMAL LOW (ref >60.00)
Glucose, Bld: 97 mg/dL (ref 70–99)
Potassium: 3.6 mEq/L (ref 3.5–5.1)
Sodium: 144 mEq/L (ref 135–145)

## 2022-11-17 DIAGNOSIS — M25572 Pain in left ankle and joints of left foot: Secondary | ICD-10-CM | POA: Diagnosis not present

## 2022-11-17 DIAGNOSIS — M7989 Other specified soft tissue disorders: Secondary | ICD-10-CM | POA: Diagnosis not present

## 2022-11-17 DIAGNOSIS — W57XXXA Bitten or stung by nonvenomous insect and other nonvenomous arthropods, initial encounter: Secondary | ICD-10-CM | POA: Diagnosis not present

## 2022-11-17 DIAGNOSIS — L03116 Cellulitis of left lower limb: Secondary | ICD-10-CM | POA: Diagnosis not present

## 2022-11-17 DIAGNOSIS — M79672 Pain in left foot: Secondary | ICD-10-CM | POA: Diagnosis not present

## 2022-11-17 DIAGNOSIS — M19072 Primary osteoarthritis, left ankle and foot: Secondary | ICD-10-CM | POA: Diagnosis not present

## 2022-11-21 ENCOUNTER — Ambulatory Visit (INDEPENDENT_AMBULATORY_CARE_PROVIDER_SITE_OTHER): Payer: Medicare HMO | Admitting: Family Medicine

## 2022-11-21 ENCOUNTER — Encounter: Payer: Self-pay | Admitting: Family Medicine

## 2022-11-21 VITALS — BP 110/62 | HR 93 | Temp 98.1°F | Wt 255.4 lb

## 2022-11-21 DIAGNOSIS — L03116 Cellulitis of left lower limb: Secondary | ICD-10-CM

## 2022-11-21 NOTE — Progress Notes (Signed)
Subjective:    Patient ID: Erik [name], male    DOB: July 30, 1945, 77 y.o.   MRN: [id number]  HPI Here to follow up an urgent care visit on 11-17-22 in [place] for an apparent spider bite. He woke up during the night before with swelling and pain in the left ankle and foot. He saw a tiny bite mark in the middle of this, but he swelling and redness began to extend halfway up the lower leg. No fever or systemic symptoms. At th urgent care he had Xrays done of the left foot which were negative. He was started on 10 days of Doxycycline. He has been elevating the leg and applying ice. Today he feels better. He has still has pain in the area but this has improved. The swelling and redness has also improved.    Review of Systems  Constitutional: Negative.   Respiratory: Negative.    Cardiovascular:  Positive for leg swelling. Negative for chest pain and palpitations.  Skin:  Positive for color change.       Objective:   Physical Exam Constitutional:      Comments: He walks with a slight limp   Cardiovascular:     Rate and Rhythm: Normal rate and regular rhythm.     Pulses: Normal pulses.     Heart sounds: Normal heart sounds.  Pulmonary:     Effort: Pulmonary effort is normal.     Breath sounds: Normal breath sounds.  Skin:    Comments: There is a tiny bite mark on the left lateral lower leg which has scabbed over. The lower leg and foot are swollen, pink, and warm. He is tender in the left lower leg and foot. There are ecchymoses around the left heel  Neurological:     Mental Status: He is alert.           Assessment & Plan:  Cellulitis from a spider bite. He will complete the 10 days of Doxycycline. After that he can return as needed.  Gershon Crane, MD

## 2022-12-18 ENCOUNTER — Ambulatory Visit (INDEPENDENT_AMBULATORY_CARE_PROVIDER_SITE_OTHER): Payer: Medicare HMO

## 2022-12-18 DIAGNOSIS — I495 Sick sinus syndrome: Secondary | ICD-10-CM

## 2022-12-18 DIAGNOSIS — I4819 Other persistent atrial fibrillation: Secondary | ICD-10-CM

## 2022-12-20 LAB — CUP PACEART REMOTE DEVICE CHECK
Battery Remaining Longevity: 38 mo
Battery Remaining Percentage: 32 %
Battery Voltage: 2.96 V
Brady Statistic AP VP Percent: 1 %
Brady Statistic AP VS Percent: 66 %
Brady Statistic AS VP Percent: 1 %
Brady Statistic AS VS Percent: 33 %
Brady Statistic RA Percent Paced: 65 %
Brady Statistic RV Percent Paced: 1 %
Date Time Interrogation Session: 20241023025203
Implantable Lead Connection Status: 753985
Implantable Lead Connection Status: 753985
Implantable Lead Implant Date: 20170501
Implantable Lead Implant Date: 20170501
Implantable Lead Location: 753859
Implantable Lead Location: 753860
Implantable Pulse Generator Implant Date: 20170501
Lead Channel Impedance Value: 440 Ohm
Lead Channel Impedance Value: 580 Ohm
Lead Channel Pacing Threshold Amplitude: 0.75 V
Lead Channel Pacing Threshold Amplitude: 0.75 V
Lead Channel Pacing Threshold Pulse Width: 0.5 ms
Lead Channel Pacing Threshold Pulse Width: 0.5 ms
Lead Channel Sensing Intrinsic Amplitude: 3 mV
Lead Channel Sensing Intrinsic Amplitude: 8.1 mV
Lead Channel Setting Pacing Amplitude: 1.75 V
Lead Channel Setting Pacing Amplitude: 2.5 V
Lead Channel Setting Pacing Pulse Width: 0.5 ms
Lead Channel Setting Sensing Sensitivity: 2 mV
Pulse Gen Model: 2272
Pulse Gen Serial Number: 7897836

## 2022-12-27 ENCOUNTER — Other Ambulatory Visit: Payer: Self-pay | Admitting: Family Medicine

## 2023-01-02 ENCOUNTER — Other Ambulatory Visit: Payer: Self-pay | Admitting: Family Medicine

## 2023-01-04 NOTE — Progress Notes (Signed)
Remote pacemaker transmission.   

## 2023-01-29 ENCOUNTER — Other Ambulatory Visit: Payer: Self-pay | Admitting: Family Medicine

## 2023-01-29 ENCOUNTER — Other Ambulatory Visit: Payer: Self-pay | Admitting: Cardiology

## 2023-01-29 DIAGNOSIS — I10 Essential (primary) hypertension: Secondary | ICD-10-CM

## 2023-02-25 ENCOUNTER — Other Ambulatory Visit: Payer: Self-pay | Admitting: Family Medicine

## 2023-03-19 ENCOUNTER — Ambulatory Visit (INDEPENDENT_AMBULATORY_CARE_PROVIDER_SITE_OTHER): Payer: Medicare HMO

## 2023-03-19 DIAGNOSIS — I495 Sick sinus syndrome: Secondary | ICD-10-CM

## 2023-03-20 LAB — CUP PACEART REMOTE DEVICE CHECK
Battery Remaining Longevity: 35 mo
Battery Remaining Percentage: 30 %
Battery Voltage: 2.95 V
Brady Statistic AP VP Percent: 1 %
Brady Statistic AP VS Percent: 67 %
Brady Statistic AS VP Percent: 1 %
Brady Statistic AS VS Percent: 32 %
Brady Statistic RA Percent Paced: 67 %
Brady Statistic RV Percent Paced: 1 %
Date Time Interrogation Session: 20250122023513
Implantable Lead Connection Status: 753985
Implantable Lead Connection Status: 753985
Implantable Lead Implant Date: 20170501
Implantable Lead Implant Date: 20170501
Implantable Lead Location: 753859
Implantable Lead Location: 753860
Implantable Pulse Generator Implant Date: 20170501
Lead Channel Impedance Value: 430 Ohm
Lead Channel Impedance Value: 590 Ohm
Lead Channel Pacing Threshold Amplitude: 0.75 V
Lead Channel Pacing Threshold Amplitude: 0.75 V
Lead Channel Pacing Threshold Pulse Width: 0.5 ms
Lead Channel Pacing Threshold Pulse Width: 0.5 ms
Lead Channel Sensing Intrinsic Amplitude: 1.5 mV
Lead Channel Sensing Intrinsic Amplitude: 9.2 mV
Lead Channel Setting Pacing Amplitude: 1.75 V
Lead Channel Setting Pacing Amplitude: 2.5 V
Lead Channel Setting Pacing Pulse Width: 0.5 ms
Lead Channel Setting Sensing Sensitivity: 2 mV
Pulse Gen Model: 2272
Pulse Gen Serial Number: 7897836

## 2023-04-03 ENCOUNTER — Telehealth: Payer: Self-pay

## 2023-04-03 NOTE — Progress Notes (Signed)
 This encounter was created in error - please disregard.

## 2023-04-03 NOTE — Telephone Encounter (Signed)
 Unsuccessful attempts to reach patient on preferred numbers listed in notes for scheduled AWV. Left messages on voicemail okay to reschedule.

## 2023-04-04 ENCOUNTER — Other Ambulatory Visit: Payer: Self-pay | Admitting: Family Medicine

## 2023-04-08 ENCOUNTER — Ambulatory Visit: Payer: Medicare HMO | Attending: Cardiology | Admitting: Cardiology

## 2023-04-08 ENCOUNTER — Encounter: Payer: Self-pay | Admitting: Cardiology

## 2023-04-08 VITALS — BP 104/62 | HR 68 | Ht 69.0 in | Wt 263.0 lb

## 2023-04-08 DIAGNOSIS — D6869 Other thrombophilia: Secondary | ICD-10-CM | POA: Diagnosis not present

## 2023-04-08 DIAGNOSIS — I4819 Other persistent atrial fibrillation: Secondary | ICD-10-CM | POA: Diagnosis not present

## 2023-04-08 DIAGNOSIS — I483 Typical atrial flutter: Secondary | ICD-10-CM

## 2023-04-08 DIAGNOSIS — I10 Essential (primary) hypertension: Secondary | ICD-10-CM

## 2023-04-08 LAB — CUP PACEART INCLINIC DEVICE CHECK
Battery Remaining Longevity: 37 mo
Battery Voltage: 2.93 V
Brady Statistic RA Percent Paced: 66 %
Brady Statistic RV Percent Paced: 0.5 %
Date Time Interrogation Session: 20250210153837
Implantable Lead Connection Status: 753985
Implantable Lead Connection Status: 753985
Implantable Lead Implant Date: 20170501
Implantable Lead Implant Date: 20170501
Implantable Lead Location: 753859
Implantable Lead Location: 753860
Implantable Pulse Generator Implant Date: 20170501
Lead Channel Impedance Value: 412.5 Ohm
Lead Channel Impedance Value: 562.5 Ohm
Lead Channel Pacing Threshold Amplitude: 0.75 V
Lead Channel Pacing Threshold Amplitude: 0.75 V
Lead Channel Pacing Threshold Amplitude: 1 V
Lead Channel Pacing Threshold Amplitude: 1 V
Lead Channel Pacing Threshold Pulse Width: 0.5 ms
Lead Channel Pacing Threshold Pulse Width: 0.5 ms
Lead Channel Pacing Threshold Pulse Width: 0.5 ms
Lead Channel Pacing Threshold Pulse Width: 0.5 ms
Lead Channel Sensing Intrinsic Amplitude: 3.3 mV
Lead Channel Sensing Intrinsic Amplitude: 8.2 mV
Lead Channel Setting Pacing Amplitude: 1.875
Lead Channel Setting Pacing Amplitude: 2.5 V
Lead Channel Setting Pacing Pulse Width: 0.5 ms
Lead Channel Setting Sensing Sensitivity: 2 mV
Pulse Gen Model: 2272
Pulse Gen Serial Number: 7897836

## 2023-04-08 NOTE — Progress Notes (Signed)
  Electrophysiology Office Note:   Date:  04/08/2023  ID:  RONNY SICO, DOB 09-Feb-1946, MRN [id number]  Primary Cardiologist: Lauro Portal, MD Primary Heart Failure: None Electrophysiologist: Yusuf Yu Cortland Ding, MD      History of Present Illness:   Erik [name] is a 78 y.o. male with h/o atrial fibrillation, coronary artery disease, hypertension, hyperlipidemia, hypothyroidism, tachybradycardia syndrome seen today for routine electrophysiology followup.   Since last being seen in our clinic the patient reports doing overall well.  He has no acute cardiac complaints other than dizziness upon standing.  He had orthostatic vitals, and was found to be not orthostatic.  He is concerned about his gabapentin .  He takes this for peripheral neuropathy.  He has been feeling this way for the last 6 or 7 months..  he denies chest pain, palpitations, dyspnea, PND, orthopnea, nausea, vomiting, dizziness, syncope, edema, weight gain, or early satiety.   Review of systems complete and found to be negative unless listed in HPI.      EP Information / Studies Reviewed:    EKG is ordered today. Personal review as below.  EKG Interpretation Date/Time:  Monday April 08 2023 15:16:31 EST Ventricular Rate:  68 PR Interval:  154 QRS Duration:  78 QT Interval:  376 QTC Calculation: 399 R Axis:   26  Text Interpretation: Atrial-paced rhythm When compared with ECG of 11-Dec-2021 11:38, No significant change was found Confirmed by Izayah Miner ([id number]) on 04/08/2023 3:33:35 PM   PPM Interrogation-  reviewed in detail today,  See PACEART report.  Device History: Abbott Dual Chamber PPM implanted 06/27/15 follow-up for Tachy-Brady syndrome  Risk Assessment/Calculations:    CHA2DS2-VASc Score = 3   This indicates a 3.2% annual risk of stroke. The patient's score is based upon: CHF History: 0 HTN History: 1 Diabetes History: 0 Stroke History: 0 Vascular Disease History: 0 Age Score: 2 Gender  Score: 0             Physical Exam:   VS:  BP 104/62   Pulse 68   Ht 5\' 9"  (1.753 m)   Wt 263 lb (119.3 kg)   SpO2 98%   BMI 38.84 kg/m    Wt Readings from Last 3 Encounters:  04/08/23 263 lb (119.3 kg)  11/21/22 255 lb 6.4 oz (115.8 kg)  10/31/22 252 lb (114.3 kg)     GEN: Well nourished, well developed in no acute distress NECK: No JVD; No carotid bruits CARDIAC: Regular rate and rhythm, no murmurs, rubs, gallops RESPIRATORY:  Clear to auscultation without rales, wheezing or rhonchi  ABDOMEN: Soft, non-tender, non-distended EXTREMITIES:  No edema; No deformity   ASSESSMENT AND PLAN:    Tachy-Brady syndrome s/p Abbott PPM  Normal PPM function See Pace Art report No changes today   2.  Persistent atrial fibrillation: Post ablation in 2018.  Currently on diltiazem  120 mg daily.  Currently well-controlled  3.  Atrial flutter: Occurred 12/04/2021.  No obvious recurrence  4.  Secondary hypercoagulable state: Currently on the relative 20 mg daily  5.  Hypertension: Blood pressure is low today.  He has orthostatic symptoms, but was not orthostatic orthostatic vitals.  Jhase Creppel stop losartan .  Disposition:   Follow up with Dr. Lawana Pray in 12 months  Signed, Laryah Neuser Cortland Ding, MD

## 2023-04-08 NOTE — Patient Instructions (Signed)
 Medication Instructions:  Your physician has recommended you make the following change in your medication:  STOP Losartan   *If you need a refill on your cardiac medications before your next appointment, please call your pharmacy*   Lab Work: None ordered   Testing/Procedures: None ordered   Follow-Up: At [facility], you and your health needs are our priority.  As part of our continuing mission to provide you with exceptional heart care, we have created designated Provider Care Teams.  These Care Teams include your primary Cardiologist (physician) and Advanced Practice Providers (APPs -  Physician Assistants and Nurse Practitioners) who all work together to provide you with the care you need, when you need it.  Your next appointment:   1 year(s)  The format for your next appointment:   In Person  Provider:   Agatha Horsfall, MD    Thank you for choosing [facility]!!   Reece Cane, RN [phone]

## 2023-04-24 ENCOUNTER — Other Ambulatory Visit: Payer: Self-pay | Admitting: Family Medicine

## 2023-04-25 ENCOUNTER — Encounter: Payer: Self-pay | Admitting: Family Medicine

## 2023-04-25 ENCOUNTER — Ambulatory Visit (INDEPENDENT_AMBULATORY_CARE_PROVIDER_SITE_OTHER): Payer: Medicare HMO | Admitting: Family Medicine

## 2023-04-25 DIAGNOSIS — Z0000 Encounter for general adult medical examination without abnormal findings: Secondary | ICD-10-CM

## 2023-04-25 NOTE — Patient Instructions (Signed)
 I really enjoyed getting to talk with you today! I am available on Tuesdays and Thursdays for virtual visits if you have any questions or concerns, or if I can be of any further assistance.   CHECKLIST FROM ANNUAL WELLNESS VISIT:  -Follow up (please call to schedule if not scheduled after visit):   -yearly for annual wellness visit with primary care office  Here is a list of your preventive care/health maintenance measures and the plan for each if any are due:  PLAN For any measures below that may be due:  -can get the tetanus and covid vaccines at the pharmacy, please let us know if you do so that we can update your record  Health Maintenance  Topic Date Due   DTaP/Tdap/Td (2 - Tdap) 06/15/2018   COVID-19 Vaccine (7 - 2024-25 season) 10/28/2022   Medicare Annual Wellness (AWV)  04/24/2024   Pneumonia Vaccine 20+ Years old  Completed   INFLUENZA VACCINE  Completed   Hepatitis C Screening  Completed   HPV VACCINES  Aged Out   Colonoscopy  Discontinued   Zoster Vaccines- Shingrix  Discontinued    -See a dentist at least yearly  -Get your eyes checked and then per your eye specialist's recommendations  -Other issues addressed today:   -I have included below further information regarding a healthy whole foods based diet, physical activity guidelines for adults, stress management and opportunities for social connections. I hope you find this information useful.   -----------------------------------------------------------------------------------------------------------------------------------------------------------------------------------------------------------------------------------------------------------    NUTRITION: -eat real food: lots of colorful vegetables (half the plate) and fruits -5-7 servings of vegetables and fruits per day (fresh or steamed is best), exp. 2 servings of vegetables with lunch and dinner and 2 servings of fruit per day. Berries and greens such as  kale and collards are great choices.  -consume on a regular basis:  fresh fruits, fresh veggies, fish, nuts, seeds, healthy oils (such as olive oil, avocado oil), whole grains (make sure for bread/pasta/crackers/etc., that the first ingredient on label contains the word "whole"), legumes. -can eat small amounts of dairy and lean meat (no larger than the palm of your hand), but avoid processed meats such as ham, bacon, lunch meat, etc. -drink water -try to avoid fast food and pre-packaged foods, processed meat, ultra processed foods/beverages (donuts, candy, etc.) -most experts advise limiting sodium to < 2300mg  per day, should limit further is any chronic conditions such as high blood pressure, heart disease, diabetes, etc. The American Heart Association advised that < 1500mg  is is ideal -try to avoid foods/beverages that contain any ingredients with names you do not recognize  -try to avoid foods/beverages  with added sugar or sweeteners/sweets  -try to avoid sweet drinks (including diet drinks): soda, juice, Gatorade, sweet tea, power drinks, diet drinks -try to avoid white rice, white bread, pasta (unless whole grain)  EXERCISE GUIDELINES FOR ADULTS: -if you wish to increase your physical activity, do so gradually and with the approval of your doctor -STOP and seek medical care immediately if you have any chest pain, chest discomfort or trouble breathing when starting or increasing exercise  -move and stretch your body, legs, feet and arms when sitting for long periods -Physical activity guidelines for optimal health in adults: -get at least 150 minutes per week of moderate exercise (can talk, but not sing); this is about 20-30 minutes of sustained activity 5-7 days per week or two 10-15 minute episodes of sustained activity 5-7 days per week -do some muscle building/resistance training/strength  training at least 2 days per week  -balance exercises 3+ days per week:   Stand somewhere where you  have something sturdy to hold onto if you lose balance    1) lift up on toes, then back down, start with 5x per day and work up to 20x   2) stand and lift one leg straight out to the side so that foot is a few inches of the floor, start with 5x each side and work up to 20x each side   3) stand on one foot, start with 5 seconds each side and work up to 20 seconds on each side  If you need ideas or help with getting more active:  -Silver sneakers https://tools.silversneakers.com  -Walk with a Doc: http://www.duncan-williams.com/  -try to include resistance (weight lifting/strength building) and balance exercises twice per week: or the following link for ideas: http://castillo-powell.com/  BuyDucts.dk  STRESS MANAGEMENT: -can try meditating, or just sitting quietly with deep breathing while intentionally relaxing all parts of your body for 5 minutes daily -if you need further help with stress, anxiety or depression please follow up with your primary doctor or contact the wonderful folks at [facility]: [phone]  SOCIAL CONNECTIONS: -options in [place] if you wish to engage in more social and exercise related activities:  -Silver sneakers https://tools.silversneakers.com  -Walk with a Doc: http://www.duncan-williams.com/  -Check out the [facility] Active Adults 50+ section on the [place] [employer] (hiking clubs, book clubs, cards and games, chess, exercise classes, aquatic classes and much more) - see the website for details: https://www.[place].gov/departments/parks-recreation/active-adults50  -YouTube has lots of exercise videos for different ages and abilities as well  -[facility] (a variety of indoor and outdoor inperson activities for adults). [phone]. [address]  -Virtual Online Classes (a variety of topics): see seniorplanet.org or  call [phone]  -consider volunteering at a school, hospice center, church, senior center or elsewhere

## 2023-04-25 NOTE — Progress Notes (Signed)
 Patient unable to obtain vital signs due to telehealth visit

## 2023-04-25 NOTE — Progress Notes (Signed)
 PATIENT CHECK-IN and HEALTH RISK ASSESSMENT QUESTIONNAIRE:  -completed by phone/video for upcoming Medicare Preventive Visit    Pre-Visit Check-in: 1)Vitals (height, wt, BP, etc) - record in vitals section for visit on day of visit Request home vitals (wt, BP, etc.) and enter into vitals, THEN update Vital Signs SmartPhrase below at the top of the HPI. See below.  2)Review and Update Medications, Allergies PMH, Surgeries, Social history in Epic 3)Hospitalizations in the last year with date/reason? No  4)Review and Update Care Team (patient's specialists) in Epic 5) Complete PHQ9 in Epic  6) Complete Fall Screening in Epic 7)Review all Health Maintenance Due and order under PCP if not done.  Medicare Wellness Patient Questionnaire:  Answer theses question about your habits: How often do you have a drink containing alcohol? 1 beer every 2-3 week Have you ever smoked?Yes Quit date if applicable? 02/1995  How many packs a day do/did you smoke?  1 Do you use smokeless tobacco? No Do you use an illicit drugs? No On average, how many days per week do you engage in moderate to strenuous exercise (like a brisk walk)?no  On average, how many minutes do you engage in exercise at this level?N/A  Typical breakfast: Toast  Typical lunch: Varies  Typical dinner: Varies  Typical snacks: Ice cream, Chips, Nuts   Beverages: Ginger ale  Answer theses question about your everyday activities: Can you perform most household chores? Yes Are you deaf or have significant trouble hearing? Yes, but seems to do ok  Do you feel that you have a problem with memory? No Do you feel safe at home? Yes  Last dentist visit? 3 years ago - dentures 8. Do you have any difficulty performing your everyday activities? No Are you having any difficulty walking, taking medications on your own, and or difficulty managing daily home needs? No Do you have difficulty walking or climbing stairs?No Do you have difficulty  dressing or bathing?No Do you have difficulty doing errands alone such as visiting a doctor's office or shopping? No Do you currently have any difficulty preparing food and eating? No Do you currently have any difficulty using the toilet? No Do you have any difficulty managing your finances? No Do you have any difficulties with housekeeping of managing your housekeeping?No   Do you have Advanced Directives in place (Living Will, Healthcare Power or Attorney)? Yes    Last eye Exam and location? 2 years ago - has appt to go in March   Do you currently use prescribed or non-prescribed narcotic or opioid pain medications? No  Do you have a history or close family history of breast, ovarian, tubal or peritoneal cancer or a family member with BRCA (breast cancer susceptibility 1 and 2) gene mutations? No   Nurse/Assistant Credentials/time stamp: MG 3:22 pm   ----------------------------------------------------------------------------------------------------------------------------------------------------------------------------------------------------------------------  Because this visit was a virtual/telehealth visit, some criteria may be missing or patient reported. Any vitals not documented were not able to be obtained and vitals that have been documented are patient reported.    MEDICARE ANNUAL PREVENTIVE CARE VISIT WITH PROVIDER (Welcome to Medicare, initial annual wellness or annual wellness exam)  Virtual Visit via Phone Note  I connected with Erik [name] on 04/25/23  by phone and verified that I am speaking with the correct person using two identifiers. He prefers to talk by phone and declines video.  Location patient: home Location provider:work or home office Persons participating in the virtual visit: patient, provider  Concerns and/or follow up  today: Doing ok. Saw cardiologist recently. Has neuropathy. On internet sees all kinds of crazy stuff on the internet. He is  wondering if he should stop the medication as he thinks it makes him drowsy.    See HM section in Epic for other details of completed HM.    ROS: negative for report of fevers, unintentional weight loss, vision changes, vision loss, hearing loss or change, chest pain, sob, hemoptysis, melena, hematochezia, hematuria, falls, bleeding or bruising, thoughts of suicide or self harm, memory loss  Patient-completed extensive health risk assessment - reviewed and discussed with the patient: See Health Risk Assessment completed with patient prior to the visit either above or in recent phone note. This was reviewed in detailed with the patient today and appropriate recommendations, orders and referrals were placed as needed per Summary below and patient instructions.   Review of Medical History: -PMH, PSH, Family History and current specialty and care providers reviewed and updated and listed below   Patient Care Team: Nelwyn Salisbury, MD as PCP - General (Family Medicine) Regan Lemming, MD as PCP - Electrophysiology (Cardiology) Runell Gess, MD as PCP - Cardiology (Cardiology) Verner Chol, [facility] (Inactive) as Pharmacist (Pharmacist)   Past Medical History:  Diagnosis Date   Anxiety    Arthritis    Atypical chest pain 06/26/2015   BPH with urinary obstruction    Cancer (HCC)BASAL CELL SKIN CANCER    Carotid disease, bilateral (HCC)    a. 09/2014 Carotid U/S: 1-39% bilat ICA stenosis.   Chronic lower back pain    CKD (chronic kidney disease), stage III (HCC)    pt. Denton Lank denies   Diverticulitis 12/2016   Diverticulosis    Dyspnea    WITH EXERTION   GERD (gastroesophageal reflux disease)    Gout attack    LAST FLARE 08-31-2021 ALL SYMPTOMS RESOLVED   History of kidney stones    Hx of adenomatous colonic polyps 07/2020   2 diminutive   Hyperlipidemia    Hypertension    Hypothyroidism    Internal hemorrhoids    Long QT interval 06/26/2015   Nephrolithiasis     Neuropathy 07/30/2017   TOES ON BOTH FEET   Non-obstructive CAD    a. 07/2002 Cath: LM 20, LAD 66m/d, LCX 50-93m, OM1 69m, RCA 68m, EF 60%; b. 05/2014 MV: low risk w/ small sized, mild intensity rev defect in apical/inferior/infsept area, nl EF->Med Rx.   Panic attack    NONE RECENT PER WIFE 10-09-2021   Paroxysmal atrial fibrillation (HCC)    a. Dx 04/2014; b. 05/2014 Echo: Ef 60-65%, no rwma, triv MR/TR, nl RV;  c. CHA2DS2VASc = 3-->was on eliquis, switched to xarelto 04/2015 2/2 cost; d. 05/2015 Tikosyn loaded w/ conversion to AF; e. 06/2015 QTc prolongation and bradycardia->tikosyn d/c'd, PPM placed, Amio started; f. 07/01/2015 In Aflutter @ clinic f/u.   Paroxysmal atrial flutter (HCC)    a. 06/2015 noted to be in rapid Aflutter in Afib clinic-->amio load continued.   Persistent atrial fibrillation (HCC) 06/21/2015   Presence of permanent cardiac pacemaker    St. Jude   Prolapsed internal hemorrhoids, grade 2-3 10/19/2016   LL Gr 3 and RP/RA Gr 2 LL banded 10/19/2016    RLL pneumonia 11/05/2012   Sleep apnea    "can't tolerate mask" (05/17/2017)   Tachy-brady syndrome (HCC)    a. 06/27/2015 s/p SJM DC PPM (ser # @ [id number]).    Past Surgical History:  Procedure Laterality Date   ATRIAL  FIBRILLATION ABLATION N/A 05/17/2017   Procedure: ATRIAL FIBRILLATION ABLATION;  Surgeon: Regan Lemming, MD;  Location: [facility] INVASIVE CV [facility];  Service: Cardiovascular;  Laterality: N/A;   CARDIAC CATHETERIZATION  ~ 2000   CLOSED REDUCTION HAND FRACTURE Right 1984   COLONOSCOPY  06/27/2015   per Dr. Marina Goodell, internal hemorrhoids and diverticulae, repeat 10 yrs   COLONSCOPY     FEB OR MARCH 2023 AT DR Leone Payor OFFICE   CYSTOSCOPY WITH RETROGRADE PYELOGRAM, URETEROSCOPY AND STENT PLACEMENT Bilateral 02/14/2018   Procedure: CYSTOSCOPY WITH RETROGRADE PYELOGRAM, URETEROSCOPY AND STENT PLACEMENT;  Surgeon: Sebastian Ache, MD;  Location: [facility];  Service: Urology;  Laterality: Bilateral;   EP IMPLANTABLE DEVICE N/A  06/27/2015   Procedure: Pacemaker Implant;  Surgeon: Marinus Maw, MD;  Location: [facility] INVASIVE CV LAB;  Service: Cardiovascular;  Laterality: N/A;   EYE SURGERY  08/03/2019   Cataract   EYE SURGERY  08/17/2019   Cataact   FRACTURE SURGERY     right hand,finger YRS AGO   HEMORRHOID BANDING     DONE SEVERAL TIMES LAST 4 TO 5 YRS AGOPER WIFE 10-09-2021   HEMORRHOID SURGERY N/A 10/13/2021   Procedure: HEMORRHOIDECTOMY 2 vs 3 COLUMN, DISIMPACTION;  Surgeon: Andria Meuse, MD;  Location: [facility];  Service: General;  Laterality: N/A;   HOLMIUM LASER APPLICATION Bilateral 02/14/2018   Procedure: HOLMIUM LASER APPLICATION;  Surgeon: Sebastian Ache, MD;  Location: [facility];  Service: Urology;  Laterality: Bilateral;   LACERATION REPAIR Right 1984   "hand"   POSTERIOR LUMBAR FUSION  1998   2 lumbar discs, Dr. Danielle Dess; "ray cages"   RECTAL EXAM UNDER ANESTHESIA N/A 10/13/2021   Procedure: ANORECTAL EXAM UNDER ANESTHESIA;  Surgeon: Andria Meuse, MD;  Location: [facility];  Service: General;  Laterality: N/A;   SKIN CANCER EXCISION  09/2019   TOTAL HIP ARTHROPLASTY Left 09/01/2019   Procedure: LEFT TOTAL HIP ARTHROPLASTY ANTERIOR APPROACH;  Surgeon: Marcene Corning, MD;  Location: [facility];  Service: Orthopedics;  Laterality: Left;    Social History   Socioeconomic History   Marital status: Married    Spouse name: Not on file   Number of children: 1   Years of education: Not on file   Highest education level: 12th grade  Occupational History   Occupation: retired  Tobacco Use   Smoking status: Former    Current packs/day: 0.00    Average packs/day: 2.0 packs/day for 28.0 years (56.0 ttl pk-yrs)    Types: Cigarettes    Start date: 02/27/1967    Quit date: 02/27/1995    Years since quitting: 28.1   Smokeless tobacco: Never  Vaping Use   Vaping status: Never Used  Substance and Sexual Activity   Alcohol use: Yes    Alcohol/week: 1.0 standard  drink of alcohol    Types: 1 Cans of beer per week    Comment: couple times a month   Drug use: No   Sexual activity: Not Currently    Partners: Female  Other Topics Concern   Not on file  Social History Narrative   He is married and retired he has 1 child   Social Drivers of Corporate investment banker Strain: Low Risk  (04/25/2023)   Overall Financial Resource Strain (CARDIA)    Difficulty of Paying Living Expenses: Not hard at all  Food Insecurity: No Food Insecurity (04/25/2023)   Hunger Vital Sign    Worried About Running Out of Food in the  Last Year: Never true    Ran Out of Food in the Last Year: Never true  Transportation Needs: No Transportation Needs (04/25/2023)   PRAPARE - Administrator, Civil Service (Medical): No    Lack of Transportation (Non-Medical): No  Physical Activity: Inactive (04/25/2023)   Exercise Vital Sign    Days of Exercise per Week: 0 days    Minutes of Exercise per Session: 0 min  Stress: No Stress Concern Present (04/25/2023)   [employer] of Occupational Health - Occupational Stress Questionnaire    Feeling of Stress : Not at all  Social Connections: Moderately Integrated (04/25/2023)   Social Connection and Isolation Panel [NHANES]    Frequency of Communication with Friends and Family: More than three times a week    Frequency of Social Gatherings with Friends and Family: Once a week    Attends Religious Services: More than 4 times per year    Active Member of [employer] or Organizations: No    Attends Banker Meetings: Never    Marital Status: Married  Catering manager Violence: Not At Risk (04/25/2023)   Humiliation, Afraid, Rape, and Kick questionnaire    Fear of Current or Ex-Partner: No    Emotionally Abused: No    Physically Abused: No    Sexually Abused: No    Family History  Problem Relation Age of Onset   Dementia Mother    Stroke Father    Diabetes Paternal Grandmother    Stroke Paternal Aunt        x  2   Cancer Paternal Aunt        type unknown   Colon cancer Neg Hx    Stomach cancer Neg Hx     Current Outpatient Medications on File Prior to Visit  Medication Sig Dispense Refill   acetaminophen (TYLENOL) 500 MG tablet Take 1,000 mg by mouth every 6 (six) hours as needed for moderate pain.     diltiazem (CARDIZEM CD) 120 MG 24 hr capsule Take 1 capsule (120 mg total) by mouth daily. 90 capsule 3   famotidine (PEPCID) 20 MG tablet Take 20 mg by mouth daily as needed (acid reflux).     furosemide (LASIX) 40 MG tablet TAKE 1 TABLET(40 MG) BY MOUTH DAILY 90 tablet 3   gabapentin (NEURONTIN) 600 MG tablet TAKE ONE TABLET BY MOUTH THREE TIMES A DAY 270 tablet 0   levothyroxine (SYNTHROID) 50 MCG tablet TAKE ONE TABLET BY MOUTH ONE TIME DAILY 90 tablet 1   polyethylene glycol (MIRALAX / GLYCOLAX) 17 g packet Take 17 g by mouth 2 (two) times daily. 30 each 0   potassium chloride (KLOR-CON) 10 MEQ tablet Take 2 tablets (20 mEq total) by mouth daily. 180 tablet 3   predniSONE (DELTASONE) 10 MG tablet TAKE ONE TABLET BY MOUTH ONE TIME DAILY WITH BREAKFAST 90 tablet 1   psyllium (METAMUCIL) 58.6 % powder Take 1 packet by mouth as needed. With water or juice     rivaroxaban (XARELTO) 20 MG TABS tablet TAKE 1 TABLET(20 MG) BY MOUTH DAILY WITH SUPPER 90 tablet 3   simvastatin (ZOCOR) 20 MG tablet TAKE ONE TABLET BY MOUTH ONE TIME DAILY AT 6PM. 90 tablet 0   tamsulosin (FLOMAX) 0.4 MG CAPS capsule TAKE TWO CAPSULES BY MOUTH ONE TIME DAILY 180 capsule 0   triamcinolone (NASACORT ALLERGY 24HR) 55 MCG/ACT AERO nasal inhaler Place 1 spray into the nose at bedtime as needed (nasal congestion).     triamcinolone  cream (KENALOG) 0.1 % Apply 1 Application topically daily as needed (itching, rash).     No current facility-administered medications on file prior to visit.    No Known Allergies     Physical Exam Vitals requested from patient and listed below if patient had equipment and was able to obtain at  home for this virtual visit: There were no vitals filed for this visit. Estimated body mass index is 38.84 kg/m as calculated from the following:   Height as of 04/08/23: 5\' 9"  (1.753 m).   Weight as of 04/08/23: 263 lb (119.3 kg).  EKG (optional): deferred due to virtual visit  GENERAL: alert, oriented, no acute distress detected; full vision exam deferred due to pandemic and/or virtual encounter  PSYCH/NEURO: pleasant and cooperative, no obvious depression or anxiety, speech and thought processing grossly intact, Cognitive function grossly intact  Flowsheet Row Clinical Support from 04/25/2023 in [facility] [place]  PHQ-9 Total Score 2           04/25/2023    3:11 PM 03/26/2022    2:24 PM 11/30/2021   11:16 AM 08/31/2021    9:21 AM 06/21/2021    9:53 AM  Depression screen PHQ 2/9  Decreased Interest 0 0 2 2 0  Down, Depressed, Hopeless 0 0 0 0 0  PHQ - 2 Score 0 0 2 2 0  Altered sleeping 0  2 0 0  Tired, decreased energy 1  3 2 2   Change in appetite 1  0 0 3  Feeling bad or failure about yourself  0  2 0 0  Trouble concentrating 0  2 0 0  Moving slowly or fidgety/restless 0  2 0 0  Suicidal thoughts 0  0 0 0  PHQ-9 Score 2  13 4 5   Difficult doing work/chores Not difficult at all  Not difficult at all Not difficult at all Not difficult at all       11/30/2021   11:15 AM 12/04/2021    9:17 AM 03/26/2022    2:26 PM 07/11/2022    9:20 AM 04/25/2023    3:13 PM  Fall Risk  Falls in the past year? 0  0 0 0  Was there an injury with Fall? 0  0  0  Fall Risk Category Calculator 0  0  0  Fall Risk Category (Retired) Low      (RETIRED) Patient Fall Risk Level Low fall risk Low fall risk     Patient at Risk for Falls Due to No Fall Risks  No Fall Risks  No Fall Risks  Fall risk Follow up Falls evaluation completed  Falls prevention discussed  Falls evaluation completed     SUMMARY AND PLAN:  Encounter for Medicare annual wellness exam   Discussed  applicable health maintenance/preventive health measures and advised and referred or ordered per patient preferences: -discussed vaccines due, risks, advised can get at the pharmacy if he wishes to do Health Maintenance  Topic Date Due   DTaP/Tdap/Td (2 - Tdap) 06/15/2018   COVID-19 Vaccine (7 - 2024-25 season) 10/28/2022   Medicare Annual Wellness (AWV)  04/24/2024   Pneumonia Vaccine 60+ Years old  Completed   INFLUENZA VACCINE  Completed   Hepatitis C Screening  Completed   HPV VACCINES  Aged Out   Colonoscopy  Discontinued   Zoster Vaccines- Shingrix  Discontinued     Education and counseling on the following was provided based on the above review of health and  a plan/checklist for the patient, along with additional information discussed, was provided for the patient in the patient instructions :   -Provided counseling and plan for difficulty hearing, advised on hearing/audiology evaluation options, he is member at [employer] and may consider there, feels he is doing ok -Provided counseling and plan for increased risk of falling if applicable per above screening. Reviewed and disussed safe balance exercises that can be done at home to improve balance and discussed exercise guidelines for adults with include balance exercises at least 3 days per week.  -Advised and counseled on a healthy lifestyle  -Reviewed patient's current diet. Advised and counseled on a whole foods based healthy diet. A summary of a healthy diet was provided in the Patient Instructions. He would like to reduce some weight. Advised to reduce/cut out processed carbs/grains and food/drink with added sweeteners/sugar as much as possible.  -reviewed patient's current physical activity level and discussed exercise guidelines for adults. Discussed community resources and ideas for safe exercise at home to assist in meeting exercise guideline recommendations in a safe and healthy way.  -Advise yearly dental visits at minimum and  regular eye exams -Advised and counseled on alcohol safe limits, risks  Follow up: see patient instructions   Patient Instructions  I really enjoyed getting to talk with you today! I am available on Tuesdays and Thursdays for virtual visits if you have any questions or concerns, or if I can be of any further assistance.   CHECKLIST FROM ANNUAL WELLNESS VISIT:  -Follow up (please call to schedule if not scheduled after visit):   -yearly for annual wellness visit with primary care office  Here is a list of your preventive care/health maintenance measures and the plan for each if any are due:  PLAN For any measures below that may be due:  -can get the tetanus and covid vaccines at the pharmacy, please let us know if you do so that we can update your record  Health Maintenance  Topic Date Due   DTaP/Tdap/Td (2 - Tdap) 06/15/2018   COVID-19 Vaccine (7 - 2024-25 season) 10/28/2022   Medicare Annual Wellness (AWV)  04/24/2024   Pneumonia Vaccine 40+ Years old  Completed   INFLUENZA VACCINE  Completed   Hepatitis C Screening  Completed   HPV VACCINES  Aged Out   Colonoscopy  Discontinued   Zoster Vaccines- Shingrix  Discontinued    -See a dentist at least yearly  -Get your eyes checked and then per your eye specialist's recommendations  -Other issues addressed today:   -I have included below further information regarding a healthy whole foods based diet, physical activity guidelines for adults, stress management and opportunities for social connections. I hope you find this information useful.   -----------------------------------------------------------------------------------------------------------------------------------------------------------------------------------------------------------------------------------------------------------    NUTRITION: -eat real food: lots of colorful vegetables (half the plate) and fruits -5-7 servings of vegetables and fruits per day  (fresh or steamed is best), exp. 2 servings of vegetables with lunch and dinner and 2 servings of fruit per day. Berries and greens such as kale and collards are great choices.  -consume on a regular basis:  fresh fruits, fresh veggies, fish, nuts, seeds, healthy oils (such as olive oil, avocado oil), whole grains (make sure for bread/pasta/crackers/etc., that the first ingredient on label contains the word "whole"), legumes. -can eat small amounts of dairy and lean meat (no larger than the palm of your hand), but avoid processed meats such as ham, bacon, lunch meat, etc. -drink water -try to  avoid fast food and pre-packaged foods, processed meat, ultra processed foods/beverages (donuts, candy, etc.) -most experts advise limiting sodium to < 2300mg  per day, should limit further is any chronic conditions such as high blood pressure, heart disease, diabetes, etc. The American Heart Association advised that < 1500mg  is is ideal -try to avoid foods/beverages that contain any ingredients with names you do not recognize  -try to avoid foods/beverages  with added sugar or sweeteners/sweets  -try to avoid sweet drinks (including diet drinks): soda, juice, Gatorade, sweet tea, power drinks, diet drinks -try to avoid white rice, white bread, pasta (unless whole grain)  EXERCISE GUIDELINES FOR ADULTS: -if you wish to increase your physical activity, do so gradually and with the approval of your doctor -STOP and seek medical care immediately if you have any chest pain, chest discomfort or trouble breathing when starting or increasing exercise  -move and stretch your body, legs, feet and arms when sitting for long periods -Physical activity guidelines for optimal health in adults: -get at least 150 minutes per week of moderate exercise (can talk, but not sing); this is about 20-30 minutes of sustained activity 5-7 days per week or two 10-15 minute episodes of sustained activity 5-7 days per week -do some muscle  building/resistance training/strength training at least 2 days per week  -balance exercises 3+ days per week:   Stand somewhere where you have something sturdy to hold onto if you lose balance    1) lift up on toes, then back down, start with 5x per day and work up to 20x   2) stand and lift one leg straight out to the side so that foot is a few inches of the floor, start with 5x each side and work up to 20x each side   3) stand on one foot, start with 5 seconds each side and work up to 20 seconds on each side  If you need ideas or help with getting more active:  -Silver sneakers https://tools.silversneakers.com  -Walk with a Doc: http://www.duncan-williams.com/  -try to include resistance (weight lifting/strength building) and balance exercises twice per week: or the following link for ideas: http://castillo-powell.com/  BuyDucts.dk  STRESS MANAGEMENT: -can try meditating, or just sitting quietly with deep breathing while intentionally relaxing all parts of your body for 5 minutes daily -if you need further help with stress, anxiety or depression please follow up with your primary doctor or contact the wonderful folks at [facility]: [phone]  SOCIAL CONNECTIONS: -options in [place] if you wish to engage in more social and exercise related activities:  -Silver sneakers https://tools.silversneakers.com  -Walk with a Doc: http://www.duncan-williams.com/  -Check out the [facility], The Active Adults 50+ section on the [place] of [employer] (hiking clubs, book clubs, cards and games, chess, exercise classes, aquatic classes and much more) - see the website for details: https://www.[place].gov/departments/parks-recreation/active-adults50  -YouTube has lots of exercise videos for different ages and abilities as well  -[facility] (a variety of indoor and outdoor  inperson activities for adults). 940-375-7898. [address]  -Virtual Online Classes (a variety of topics): see seniorplanet.org or call (930)188-4535  -consider volunteering at a school, hospice center, church, senior center or elsewhere            Terressa Koyanagi, DO

## 2023-04-25 NOTE — Telephone Encounter (Signed)
 Message sent to PCP as Rx is not on the current med list

## 2023-04-29 NOTE — Addendum Note (Signed)
 Addended by: Geralyn Flash D on: 04/29/2023 03:56 PM   Modules accepted: Orders

## 2023-04-29 NOTE — Progress Notes (Signed)
 Remote pacemaker transmission.

## 2023-05-13 DIAGNOSIS — H5203 Hypermetropia, bilateral: Secondary | ICD-10-CM | POA: Diagnosis not present

## 2023-05-13 DIAGNOSIS — H524 Presbyopia: Secondary | ICD-10-CM | POA: Diagnosis not present

## 2023-05-13 DIAGNOSIS — H52223 Regular astigmatism, bilateral: Secondary | ICD-10-CM | POA: Diagnosis not present

## 2023-05-13 DIAGNOSIS — H40013 Open angle with borderline findings, low risk, bilateral: Secondary | ICD-10-CM | POA: Diagnosis not present

## 2023-05-13 DIAGNOSIS — H26493 Other secondary cataract, bilateral: Secondary | ICD-10-CM | POA: Diagnosis not present

## 2023-05-13 DIAGNOSIS — Z961 Presence of intraocular lens: Secondary | ICD-10-CM | POA: Diagnosis not present

## 2023-05-13 DIAGNOSIS — H5021 Vertical strabismus, right eye: Secondary | ICD-10-CM | POA: Diagnosis not present

## 2023-05-30 DIAGNOSIS — H40013 Open angle with borderline findings, low risk, bilateral: Secondary | ICD-10-CM | POA: Diagnosis not present

## 2023-05-30 DIAGNOSIS — H43813 Vitreous degeneration, bilateral: Secondary | ICD-10-CM | POA: Diagnosis not present

## 2023-05-30 DIAGNOSIS — Z961 Presence of intraocular lens: Secondary | ICD-10-CM | POA: Diagnosis not present

## 2023-05-30 DIAGNOSIS — H26493 Other secondary cataract, bilateral: Secondary | ICD-10-CM | POA: Diagnosis not present

## 2023-06-18 ENCOUNTER — Ambulatory Visit: Payer: Medicare HMO

## 2023-06-18 DIAGNOSIS — I495 Sick sinus syndrome: Secondary | ICD-10-CM

## 2023-06-20 LAB — CUP PACEART REMOTE DEVICE CHECK
Battery Remaining Longevity: 32 mo
Battery Remaining Percentage: 28 %
Battery Voltage: 2.93 V
Brady Statistic AP VP Percent: 1 %
Brady Statistic AP VS Percent: 64 %
Brady Statistic AS VP Percent: 1 %
Brady Statistic AS VS Percent: 35 %
Brady Statistic RA Percent Paced: 63 %
Brady Statistic RV Percent Paced: 1 %
Date Time Interrogation Session: 20250423201252
Implantable Lead Connection Status: 753985
Implantable Lead Connection Status: 753985
Implantable Lead Implant Date: 20170501
Implantable Lead Implant Date: 20170501
Implantable Lead Location: 753859
Implantable Lead Location: 753860
Implantable Pulse Generator Implant Date: 20170501
Lead Channel Impedance Value: 430 Ohm
Lead Channel Impedance Value: 580 Ohm
Lead Channel Pacing Threshold Amplitude: 0.625 V
Lead Channel Pacing Threshold Amplitude: 0.75 V
Lead Channel Pacing Threshold Pulse Width: 0.5 ms
Lead Channel Pacing Threshold Pulse Width: 0.5 ms
Lead Channel Sensing Intrinsic Amplitude: 4.6 mV
Lead Channel Sensing Intrinsic Amplitude: 9.4 mV
Lead Channel Setting Pacing Amplitude: 1.625
Lead Channel Setting Pacing Amplitude: 2.5 V
Lead Channel Setting Pacing Pulse Width: 0.5 ms
Lead Channel Setting Sensing Sensitivity: 2 mV
Pulse Gen Model: 2272
Pulse Gen Serial Number: 7897836

## 2023-06-27 ENCOUNTER — Other Ambulatory Visit: Payer: Self-pay | Admitting: Family Medicine

## 2023-07-02 ENCOUNTER — Other Ambulatory Visit: Payer: Self-pay | Admitting: Family Medicine

## 2023-07-10 ENCOUNTER — Other Ambulatory Visit: Payer: Self-pay | Admitting: Family Medicine

## 2023-07-16 DIAGNOSIS — H5021 Vertical strabismus, right eye: Secondary | ICD-10-CM | POA: Diagnosis not present

## 2023-07-16 DIAGNOSIS — H524 Presbyopia: Secondary | ICD-10-CM | POA: Diagnosis not present

## 2023-07-16 DIAGNOSIS — H43813 Vitreous degeneration, bilateral: Secondary | ICD-10-CM | POA: Diagnosis not present

## 2023-07-16 DIAGNOSIS — H35373 Puckering of macula, bilateral: Secondary | ICD-10-CM | POA: Diagnosis not present

## 2023-07-16 DIAGNOSIS — H52223 Regular astigmatism, bilateral: Secondary | ICD-10-CM | POA: Diagnosis not present

## 2023-07-23 ENCOUNTER — Other Ambulatory Visit: Payer: Self-pay

## 2023-07-23 MED ORDER — SIMVASTATIN 20 MG PO TABS
20.0000 mg | ORAL_TABLET | Freq: Every day | ORAL | 3 refills | Status: AC
Start: 2023-07-23 — End: ?

## 2023-08-01 NOTE — Progress Notes (Signed)
 Remote pacemaker transmission.

## 2023-08-01 NOTE — Addendum Note (Signed)
 Addended by: Lott Rouleau A on: 08/01/2023 02:57 PM   Modules accepted: Orders

## 2023-08-08 ENCOUNTER — Other Ambulatory Visit: Payer: Self-pay | Admitting: Family Medicine

## 2023-08-15 ENCOUNTER — Ambulatory Visit: Payer: Self-pay

## 2023-08-15 NOTE — Telephone Encounter (Signed)
 FYI Only or Action Required?: FYI only for provider.  Patient was last seen in primary care on 04/25/2023 by Maurie Southern, DO. Called Nurse Triage reporting Foot Pain. Symptoms began several years ago. Interventions attempted: Prescription medications: gabapentin . Symptoms are: unchanged.  Triage Disposition: See PCP Within 2 Weeks  Patient/caregiver understands and will follow disposition?: Yes     Copied from CRM [id number]. Topic: Clinical - Red Word Triage >> Aug 15, 2023  1:30 PM Shardie S wrote: [facility] that prompted transfer to Nurse Triage: b/l foot pain Reason for Disposition  [1] MILD pain (e.g., does not interfere with normal activities) AND [2] present > 7 days  Answer Assessment - Initial Assessment Questions 1. ONSET: When did the pain start?      A few years ago 2. LOCATION: Where is the pain located?      Started in R, now in both 3. PAIN: How bad is the pain?    (Scale 1-10; or mild, moderate, severe)  - MILD (1-3): doesn't interfere with normal activities.   - MODERATE (4-7): interferes with normal activities (e.g., work or school) or awakens from sleep, limping.   - SEVERE (8-10): excruciating pain, unable to do any normal activities, unable to walk.      5/10 Gabapentin   - with some relief, has been taking for a few years 4. WORK OR EXERCISE: Has there been any recent work or exercise that involved this part of the body?      denies 5. CAUSE: What do you think is causing the foot pain?     I don't know 6. OTHER SYMPTOMS: Do you have any other symptoms? (e.g., leg pain, rash, fever, numbness)     I do have leg pain in my shin - not all the time - usually when waking up in AM Tingling all the time denies numbness 7. PREGNANCY: Is there any chance you are pregnant? When was your last menstrual period?     N/a  Protocols used: Foot Pain-A-AH

## 2023-08-16 NOTE — Telephone Encounter (Signed)
 Noted

## 2023-08-17 ENCOUNTER — Other Ambulatory Visit: Payer: Self-pay | Admitting: Family Medicine

## 2023-08-19 ENCOUNTER — Encounter: Payer: Self-pay | Admitting: Family Medicine

## 2023-08-19 ENCOUNTER — Ambulatory Visit (INDEPENDENT_AMBULATORY_CARE_PROVIDER_SITE_OTHER): Admitting: Family Medicine

## 2023-08-19 VITALS — BP 120/62 | HR 64 | Temp 98.1°F | Wt 264.0 lb

## 2023-08-19 DIAGNOSIS — R739 Hyperglycemia, unspecified: Secondary | ICD-10-CM

## 2023-08-19 DIAGNOSIS — E039 Hypothyroidism, unspecified: Secondary | ICD-10-CM | POA: Diagnosis not present

## 2023-08-19 DIAGNOSIS — E538 Deficiency of other specified B group vitamins: Secondary | ICD-10-CM

## 2023-08-19 DIAGNOSIS — G629 Polyneuropathy, unspecified: Secondary | ICD-10-CM

## 2023-08-19 DIAGNOSIS — I10 Essential (primary) hypertension: Secondary | ICD-10-CM | POA: Diagnosis not present

## 2023-08-19 LAB — BASIC METABOLIC PANEL WITH GFR
BUN: 23 mg/dL (ref 6–23)
CO2: 29 meq/L (ref 19–32)
Calcium: 9.7 mg/dL (ref 8.4–10.5)
Chloride: 106 meq/L (ref 96–112)
Creatinine, Ser: 1.31 mg/dL (ref 0.40–1.50)
GFR: 52.89 mL/min — ABNORMAL LOW (ref >60.00)
Glucose, Bld: 103 mg/dL — ABNORMAL HIGH (ref 70–99)
Potassium: 4 meq/L (ref 3.5–5.1)
Sodium: 143 meq/L (ref 135–145)

## 2023-08-19 LAB — CBC WITH DIFFERENTIAL/PLATELET
Basophils Absolute: 0 10*3/uL (ref 0.0–0.1)
Basophils Relative: 0.4 % (ref 0.0–3.0)
Eosinophils Absolute: 0.2 10*3/uL (ref 0.0–0.7)
Eosinophils Relative: 1.8 % (ref 0.0–5.0)
HCT: 43.5 % (ref 39.0–52.0)
Hemoglobin: 14.8 g/dL (ref 13.0–17.0)
Lymphocytes Relative: 8 % — ABNORMAL LOW (ref 12.0–46.0)
Lymphs Abs: 0.8 10*3/uL (ref 0.7–4.0)
MCHC: 33.9 g/dL (ref 30.0–36.0)
MCV: 87 fl (ref 78.0–100.0)
Monocytes Absolute: 0.6 10*3/uL (ref 0.1–1.0)
Monocytes Relative: 6.5 % (ref 3.0–12.0)
Neutro Abs: 7.9 10*3/uL — ABNORMAL HIGH (ref 1.4–7.7)
Neutrophils Relative %: 83.3 % — ABNORMAL HIGH (ref 43.0–77.0)
Platelets: 185 10*3/uL (ref 150.0–400.0)
RBC: 5 Mil/uL (ref 4.22–5.81)
RDW: 14.3 % (ref 11.5–15.5)
WBC: 9.4 10*3/uL (ref 4.0–10.5)

## 2023-08-19 LAB — TSH: TSH: 2.4 u[IU]/mL (ref 0.35–5.50)

## 2023-08-19 LAB — T3, FREE: T3, Free: 3.3 pg/mL (ref 2.3–4.2)

## 2023-08-19 LAB — T4, FREE: Free T4: 0.79 ng/dL (ref 0.60–1.60)

## 2023-08-19 LAB — VITAMIN B12: Vitamin B-12: 283 pg/mL (ref 211–911)

## 2023-08-19 LAB — HEMOGLOBIN A1C: Hgb A1c MFr Bld: 5.7 % (ref 4.6–6.5)

## 2023-08-19 MED ORDER — PREDNISONE 10 MG PO TABS: 10.0000 mg | ORAL_TABLET | Freq: Every day | ORAL | 3 refills | Status: AC

## 2023-08-19 MED ORDER — TAMSULOSIN HCL 0.4 MG PO CAPS: ORAL_CAPSULE | ORAL | 3 refills | Status: AC

## 2023-08-19 MED ORDER — LEVOTHYROXINE SODIUM 50 MCG PO TABS: ORAL_TABLET | ORAL | 3 refills | Status: AC

## 2023-08-19 NOTE — Progress Notes (Signed)
   Subjective:    Patient ID: Erik [name], male    DOB: [birth year], 78 y.o.   MRN: [id number]  HPI Here to follow up on neuropathy in his feet. He says it has gotten slightly worse. He feels more numbness and tingling than pain.    Review of Systems  Constitutional: Negative.   Respiratory: Negative.    Cardiovascular: Negative.   Neurological:  Positive for numbness.       Objective:   Physical Exam Constitutional:      Appearance: Normal appearance.   Cardiovascular:     Rate and Rhythm: Normal rate and regular rhythm.     Pulses: Normal pulses.     Heart sounds: Normal heart sounds.  Pulmonary:     Effort: Pulmonary effort is normal.     Breath sounds: Normal breath sounds.   Neurological:     Mental Status: He is alert.           Assessment & Plan:  Neuropathy. We will check lab values again including B12. I gave him the option of trying Pregabalin, but he declined.  Garnette Olmsted, MD

## 2023-08-20 ENCOUNTER — Ambulatory Visit: Payer: Self-pay | Admitting: Family Medicine

## 2023-08-20 NOTE — Addendum Note (Signed)
 Addended by: JOHNNY SENIOR A on: 08/20/2023 01:07 PM   Modules accepted: Level of Service

## 2023-08-29 ENCOUNTER — Telehealth: Payer: Self-pay

## 2023-08-29 NOTE — Telephone Encounter (Signed)
 Copied from CRM [id number]. Topic: Clinical - Prescription Issue >> Aug 29, 2023  8:42 AM [name] wrote: Reason for RMF:Ejupzwu'd spouse, Erik [name], states medication, predniSONE  (DELTASONE ) 10 MG tablet, was not received by pharmacy as discussed during last appointment. Patient only has 4 pills remaining. Requesting to have prescription resent to:  [facility] [id number] - [place], [place] - [address], [address] [place] [address] Phone: [phone] Fax: [phone] Hours: Not open 24 hours

## 2023-08-29 NOTE — Telephone Encounter (Signed)
 Spoke with pt pharmacy advised that pt picked up Rx this morning

## 2023-09-17 ENCOUNTER — Ambulatory Visit: Payer: Medicare HMO

## 2023-09-17 DIAGNOSIS — I495 Sick sinus syndrome: Secondary | ICD-10-CM

## 2023-09-18 LAB — CUP PACEART REMOTE DEVICE CHECK
Battery Remaining Longevity: 29 mo
Battery Remaining Percentage: 25 %
Battery Voltage: 2.92 V
Brady Statistic AP VP Percent: 1 %
Brady Statistic AP VS Percent: 71 %
Brady Statistic AS VP Percent: 1 %
Brady Statistic AS VS Percent: 28 %
Brady Statistic RA Percent Paced: 71 %
Brady Statistic RV Percent Paced: 1 %
Date Time Interrogation Session: 20250722040015
Implantable Lead Connection Status: 753985
Implantable Lead Connection Status: 753985
Implantable Lead Implant Date: 20170501
Implantable Lead Implant Date: 20170501
Implantable Lead Location: 753859
Implantable Lead Location: 753860
Implantable Pulse Generator Implant Date: 20170501
Lead Channel Impedance Value: 410 Ohm
Lead Channel Impedance Value: 560 Ohm
Lead Channel Pacing Threshold Amplitude: 0.75 V
Lead Channel Pacing Threshold Amplitude: 0.75 V
Lead Channel Pacing Threshold Pulse Width: 0.5 ms
Lead Channel Pacing Threshold Pulse Width: 0.5 ms
Lead Channel Sensing Intrinsic Amplitude: 4.7 mV
Lead Channel Sensing Intrinsic Amplitude: 8.1 mV
Lead Channel Setting Pacing Amplitude: 1.75 V
Lead Channel Setting Pacing Amplitude: 2.5 V
Lead Channel Setting Pacing Pulse Width: 0.5 ms
Lead Channel Setting Sensing Sensitivity: 2 mV
Pulse Gen Model: 2272
Pulse Gen Serial Number: 7897836

## 2023-09-23 ENCOUNTER — Ambulatory Visit: Payer: Self-pay | Admitting: Cardiology

## 2023-09-27 ENCOUNTER — Other Ambulatory Visit: Payer: Self-pay | Admitting: Family Medicine

## 2023-10-07 ENCOUNTER — Other Ambulatory Visit: Payer: Self-pay | Admitting: Family Medicine

## 2023-10-09 ENCOUNTER — Other Ambulatory Visit: Payer: Self-pay | Admitting: Family Medicine

## 2023-10-14 ENCOUNTER — Telehealth: Payer: Self-pay

## 2023-10-14 ENCOUNTER — Other Ambulatory Visit (HOSPITAL_COMMUNITY): Payer: Self-pay | Admitting: *Deleted

## 2023-10-14 MED ORDER — DILTIAZEM HCL 30 MG PO TABS: 30.0000 mg | ORAL_TABLET | ORAL | 6 refills | Status: DC | PRN

## 2023-10-14 NOTE — Telephone Encounter (Signed)
 Alert received from [employer] for AF in progress from 8/15 @ 10:43, poor rate control, hx of AF, Xarelto  per EPIC, route to triage for rates.  Patient reports increased fatigue/shortness of breath x2 days. Compliant with medications on file. Requested remote transmission for updated information.   Discussed referral to AF clinic considering +symptoms and AFL/ RVR. Patient agreeable.   Patient currently feels fine at the moment per wife. Advised if patient becomes symptomatic please call back. Direct number left. Voiced understanding and appreciative of call.    ____________________________________________________  UPDATED TRANSMISSION BELOW 10/14/23 @ 12:20.

## 2023-10-16 ENCOUNTER — Ambulatory Visit (HOSPITAL_COMMUNITY)
Admission: RE | Admit: 2023-10-16 | Discharge: 2023-10-16 | Disposition: A | Discharge status: Home / Self Care | Source: Ambulatory Visit | Attending: Physician Assistant | Admitting: Physician Assistant

## 2023-10-16 ENCOUNTER — Telehealth: Payer: Self-pay | Admitting: *Deleted

## 2023-10-16 VITALS — BP 84/80 | HR 143 | Ht 69.0 in | Wt 255.8 lb

## 2023-10-16 DIAGNOSIS — D6869 Other thrombophilia: Secondary | ICD-10-CM | POA: Diagnosis not present

## 2023-10-16 DIAGNOSIS — I484 Atypical atrial flutter: Secondary | ICD-10-CM

## 2023-10-16 DIAGNOSIS — I4819 Other persistent atrial fibrillation: Secondary | ICD-10-CM | POA: Diagnosis not present

## 2023-10-16 DIAGNOSIS — I4891 Unspecified atrial fibrillation: Secondary | ICD-10-CM | POA: Diagnosis not present

## 2023-10-16 LAB — CBC
Hematocrit: 47.5 % (ref 37.5–51.0)
Hemoglobin: 15.5 g/dL (ref 13.0–17.7)
MCH: 29.9 pg (ref 26.6–33.0)
MCHC: 32.6 g/dL (ref 31.5–35.7)
MCV: 92 fL (ref 79–97)
Platelets: 209 x10E3/uL (ref 150–450)
RBC: 5.19 x10E6/uL (ref 4.14–5.80)
RDW: 14.6 % (ref 11.6–15.4)
WBC: 9.3 x10E3/uL (ref 3.4–10.8)

## 2023-10-16 LAB — BASIC METABOLIC PANEL WITH GFR
BUN/Creatinine Ratio: 14 (ref 10–24)
BUN: 21 mg/dL (ref 8–27)
CO2: 26 mmol/L (ref 20–29)
Calcium: 9.3 mg/dL (ref 8.6–10.2)
Chloride: 105 mmol/L (ref 96–106)
Creatinine, Ser: 1.46 mg/dL — ABNORMAL HIGH (ref 0.76–1.27)
Glucose: 110 mg/dL — ABNORMAL HIGH (ref 70–99)
Potassium: 4 mmol/L (ref 3.5–5.2)
Sodium: 143 mmol/L (ref 134–144)
eGFR: 50 mL/min/1.73 — ABNORMAL LOW (ref >59)

## 2023-10-16 NOTE — Telephone Encounter (Signed)
 Unscheduled remote transmission:  PII for monitor reset. Normal device function Known AF since 8/15.  On OAC per EMR.  Presenting rates in the 130s.  Increase in HR since AF began.  To triage.  1 VHR x 15 min, V-rate 152 bpm.  R wave similar to intrinsic. Follow up as scheduled [place], [facility] ______________________________________________________________________________   Routing to device triage to call patient and assess for symptoms and ask if pt has taken his PRN Cardizem 

## 2023-10-16 NOTE — Progress Notes (Signed)
 Primary Care Physician: [name] [name] LABOR, MD Primary Cardiologist: [name] Lesches, MD Electrophysiologist: [name] Gladis Norton, MD  Referring Physician: Dr [name] [name] [name] [name] is a 78 y.o. male with a history of CAD, HTN, HLD, hypothyroidism, tachybradycardia syndrome s/p PPM, atrial fibrillation who presents for follow up in the [facility]. March 2016 he was diagnosed with rapid atrial fibrillation and chest pain. He was loaded on dofetilide  but this was stopped and he went back into atrial fibrillation. 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 He is status post atrial fibrillation ablation 05/17/2017. He we presented to A-fib clinic 12/04/2021 after being found to be in atrial flutter. His rates were quite rapid. He was referred to the emergency room and got a cardioversion while in the emergency room. Patient is on Xarelto  for stroke prevention.    Patient presents today for follow up for atrial fibrillation and atrial flutter. The device clinic received an alert for rapid atrial flutter starting 10/11/23. He feels very fatigued. There were no specific triggers that he could identify. He has taken several doses of PRN diltiazem  with minimal impact on his heart rates.   Today, he denies symptoms of palpitations, chest pain, shortness of breath, orthopnea, PND, lower extremity edema, dizziness, presyncope, syncope, snoring, daytime somnolence, bleeding, or neurologic sequela. The patient is tolerating medications without difficulties and is otherwise without complaint today.    Atrial Fibrillation Risk Factors:  he does not have symptoms or diagnosis of sleep apnea. he does not have a history of rheumatic fever.   Atrial Fibrillation Management history:  Previous antiarrhythmic drugs: dofetilide   Previous cardioversions: 11/2021 Previous ablations: 05/17/17 Anticoagulation history: Xarelto    ROS-  All systems are reviewed and negative except as per the HPI above.  Past Medical History:  Diagnosis Date   Anxiety    Arthritis    Atypical chest pain 06/26/2015   BPH with urinary obstruction    Cancer (HCC)BASAL CELL SKIN CANCER    Carotid disease, bilateral (HCC)    a. 09/2014 Carotid U/S: 1-39% bilat ICA stenosis.   Chronic lower back pain    CKD (chronic kidney disease), stage III (HCC)    pt. vernetta denies   Diverticulitis 12/2016   Diverticulosis    Dyspnea    WITH EXERTION   GERD (gastroesophageal reflux disease)    Gout attack    LAST FLARE 08-31-2021 ALL SYMPTOMS RESOLVED   History of kidney stones    Hx of adenomatous colonic polyps 07/2020   2 diminutive   Hyperlipidemia    Hypertension    Hypothyroidism    Internal hemorrhoids    Long QT interval 06/26/2015   Nephrolithiasis    Neuropathy 07/30/2017   TOES ON BOTH FEET   Non-obstructive CAD    a. 07/2002 Cath: LM 20, LAD 25m/d, LCX 50-1m, OM1 64m, RCA 41m, EF 60%; b. 05/2014 MV: low risk w/ small sized, mild intensity rev defect in apical/inferior/infsept area, nl EF->Med Rx.   Panic attack    NONE RECENT PER WIFE 10-09-2021   Paroxysmal atrial fibrillation (HCC)    a. Dx 04/2014; b. 05/2014 Echo: Ef 60-65%, no rwma, triv MR/TR, nl RV;  c. CHA2DS2VASc = 3-->was on eliquis , switched to xarelto  04/2015 2/2 cost; d. 05/2015 Tikosyn  loaded w/ conversion to AF; e. 06/2015 QTc prolongation and bradycardia->tikosyn  d/c'd, PPM placed, Amio started; f. 07/01/2015 In Aflutter @ clinic f/u.   Paroxysmal atrial flutter (HCC)  a. 06/2015 noted to be in rapid Aflutter in Afib clinic-->amio load continued.   Persistent atrial fibrillation (HCC) 06/21/2015   Presence of permanent cardiac pacemaker    St. Jude   Prolapsed internal hemorrhoids, grade 2-3 10/19/2016   LL Gr 3 and RP/RA Gr 2 LL banded 10/19/2016    RLL pneumonia 11/05/2012   Sleep apnea    can't tolerate mask (05/17/2017)   Tachy-brady syndrome (HCC)    a. 06/27/2015 s/p  SJM DC PPM (ser # @ [id number]).    Current Outpatient Medications  Medication Sig Dispense Refill   acetaminophen  (TYLENOL ) 500 MG tablet Take 1,000 mg by mouth every 6 (six) hours as needed for moderate pain.     diltiazem  (CARDIZEM  CD) 120 MG 24 hr capsule TAKE ONE CAPSULE BY MOUTH ONE TIME DAILY 90 capsule 3   diltiazem  (CARDIZEM ) 30 MG tablet Take 1 tablet (30 mg total) by mouth every 4 (four) hours as needed (Take as needed for for HR above 100 and SBP above 100). 30 tablet 6   famotidine  (PEPCID ) 20 MG tablet Take 20 mg by mouth daily as needed (acid reflux).     furosemide  (LASIX ) 40 MG tablet TAKE ONE TABLET BY MOUTH ONE TIME DAILY 90 tablet 3   gabapentin  (NEURONTIN ) 600 MG tablet TAKE ONE TABLET BY MOUTH THREE TIMES A DAY 270 tablet 0   levothyroxine  (SYNTHROID ) 50 MCG tablet TAKE ONE TABLET BY MOUTH ONE TIME DAILY 90 tablet 3   polyethylene glycol (MIRALAX  / GLYCOLAX ) 17 g packet Take 17 g by mouth 2 (two) times daily. (Patient taking differently: Take 17 g by mouth as needed.) 30 each 0   potassium chloride  (KLOR-CON ) 10 MEQ tablet TAKE TWO TABLETS BY MOUTH ONE TIME DAILY 180 tablet 3   predniSONE  (DELTASONE ) 10 MG tablet Take 1 tablet (10 mg total) by mouth daily. 90 tablet 3   psyllium (METAMUCIL) 58.6 % powder Take 1 packet by mouth as needed. With water or juice     rivaroxaban  (XARELTO ) 20 MG TABS tablet Take 1 tablet (20 mg total) by mouth daily with supper. 90 tablet 3   simvastatin  (ZOCOR ) 20 MG tablet Take 1 tablet (20 mg total) by mouth daily at 6 PM. 90 tablet 3   tamsulosin  (FLOMAX ) 0.4 MG CAPS capsule TAKE TWO CAPSULES BY MOUTH ONE TIME DAILY 180 capsule 3   traMADol  (ULTRAM ) 50 MG tablet Take 2 tablets (100 mg total) by mouth every 6 (six) hours as needed. (Patient taking differently: Take 100 mg by mouth as needed.) 60 tablet 5   triamcinolone  (NASACORT  ALLERGY 24HR) 55 MCG/ACT AERO nasal inhaler Place 1 spray into the nose at bedtime as needed (nasal congestion).      triamcinolone  cream (KENALOG ) 0.1 % Apply 1 Application topically daily as needed (itching, rash).     No current facility-administered medications for this encounter.    Physical Exam: BP (!) 84/80   Pulse (!) 143   Ht 5' 9 (1.753 m)   Wt 116 kg   BMI 37.78 kg/m   GEN: Well nourished, well developed in no acute distress NECK: No JVD CARDIAC: Irregularly irregular rate and rhythm, no murmurs, rubs, gallops RESPIRATORY:  Clear to auscultation without rales, wheezing or rhonchi  ABDOMEN: Soft, non-tender, non-distended EXTREMITIES:  No edema; No deformity   Wt Readings from Last 3 Encounters:  10/16/23 116 kg  08/19/23 119.7 kg  04/08/23 119.3 kg     EKG today demonstrates  Atrial flutter with 2:1  block Vent. rate 143 BPM PR interval * ms QRS duration 84 ms QT/QTcB 294/453 ms   Echo 08/21/19 demonstrated   1. Left ventricular ejection fraction, by estimation, is 60 to 65%. The  left ventricle has normal function. The left ventricle has no regional  wall motion abnormalities. There is mild asymmetric left ventricular  hypertrophy. Left ventricular diastolic parameters were normal. The average left ventricular global longitudinal strain is -19.0 %.   2. Right ventricular systolic function is normal. The right ventricular  size is normal.   3. The mitral valve is normal in structure. Trivial mitral valve  regurgitation. No evidence of mitral stenosis.   4. The aortic valve is grossly normal. Aortic valve regurgitation is not  visualized. No aortic stenosis is present.    CHA2DS2-VASc Score = 3  The patient's score is based upon: CHF History: 0 HTN History: 1 Diabetes History: 0 Stroke History: 0 Vascular Disease History: 0 Age Score: 2 Gender Score: 0       ASSESSMENT AND PLAN: Persistent Atrial Fibrillation/atrial flutter (ICD10:  I48.19) The patient's CHA2DS2-VASc score is 3, indicating a 3.2% annual risk of stroke.   Previously failed to maintain SR on  dofetilide  S/p afib ablation 2019 Patient in rapid atrial flutter starting 8/15. We discussed rhythm control options. [name] plan for DCCV. Check bmet/cbc. Continue diltiazem  120 mg daily with 30 mg PRN q 4 hours for heart racing Continue Xarelto  20 mg daily ED precautions reviewed.  Secondary Hypercoagulable State (ICD10:  D68.69) The patient is at significant risk for stroke/thromboembolism based upon his CHA2DS2-VASc Score of 3.  Continue Rivaroxaban  (Xarelto ). No bleeding issues.   HTN Low today, better at home. [name] hold PRN diltiazem  since it was not helping with HR.  Tachybradycardia syndrome S/p PPM, followed by Dr Inocencio   Follow up in the AF clinic post DCCV.    Informed Consent   Shared Decision Making/Informed Consent The risks (stroke, cardiac arrhythmias rarely resulting in the need for a temporary or permanent pacemaker, skin irritation or burns and complications associated with conscious sedation including aspiration, arrhythmia, respiratory failure and death), benefits (restoration of normal sinus rhythm) and alternatives of a direct current cardioversion were explained in detail to [name] [name] and he agrees to proceed.         [facility] [address] [place], [place] [phone]

## 2023-10-16 NOTE — Telephone Encounter (Signed)
 Called patient to assess for symptoms   Patient stated, I feel terrible  Patient's spouse has been recording patient's HR and BP Q4hrs today, and said the pt's most recent HR around 1200 today was 140-150's, which is c/w presenting EGM rates on transmission  This RN asked the patient if he had taken any of his PRN Cardizem  yet today and patient said No  This RN instructed the patient to take one 30 mg tablet of Cardizem  now  This RN reminded the patient of the prescription's ordered administration frequency of Q4hrs PRN for HR >100 or SBP >150 and to try and stick to it  Patient verbalized understanding of instructions provided by this RN  Patient has scheduled OV at AF clinic today at 1400, which he was preparing to leave his house for

## 2023-10-16 NOTE — H&P (View-Only) (Signed)
 Primary Care Physician: Johnny Garnette LABOR, MD Primary Cardiologist: Dorn Lesches, MD Electrophysiologist: Will Gladis Norton, MD  Referring Physician: [name] is a 78 y.o. male with a history of CAD, HTN, HLD, hypothyroidism, tachybradycardia syndrome s/p PPM, atrial fibrillation who presents for follow up in the [facility]. March 2016 he was diagnosed with rapid atrial fibrillation and chest pain. He was loaded on dofetilide  but this was stopped and he went back into atrial fibrillation. 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 He is status post atrial fibrillation ablation 05/17/2017. He we presented to A-fib clinic 12/04/2021 after being found to be in atrial flutter. His rates were quite rapid. He was referred to the emergency room and got a cardioversion while in the emergency room. Patient is on Xarelto  for stroke prevention.    Patient presents today for follow up for atrial fibrillation and atrial flutter. The device clinic received an alert for rapid atrial flutter starting 10/11/23. He feels very fatigued. There were no specific triggers that he could identify. He has taken several doses of PRN diltiazem  with minimal impact on his heart rates.   Today, he denies symptoms of palpitations, chest pain, shortness of breath, orthopnea, PND, lower extremity edema, dizziness, presyncope, syncope, snoring, daytime somnolence, bleeding, or neurologic sequela. The patient is tolerating medications without difficulties and is otherwise without complaint today.    Atrial Fibrillation Risk Factors:  he does not have symptoms or diagnosis of sleep apnea. he does not have a history of rheumatic fever.   Atrial Fibrillation Management history:  Previous antiarrhythmic drugs: dofetilide   Previous cardioversions: 11/2021 Previous ablations: 05/17/17 Anticoagulation history: Xarelto    ROS-  All systems are reviewed and negative except as per the HPI above.  Past Medical History:  Diagnosis Date   Anxiety    Arthritis    Atypical chest pain 06/26/2015   BPH with urinary obstruction    Cancer (HCC)BASAL CELL SKIN CANCER    Carotid disease, bilateral (HCC)    a. 09/2014 Carotid U/S: 1-39% bilat ICA stenosis.   Chronic lower back pain    CKD (chronic kidney disease), stage III (HCC)    pt. vernetta denies   Diverticulitis 12/2016   Diverticulosis    Dyspnea    WITH EXERTION   GERD (gastroesophageal reflux disease)    Gout attack    LAST FLARE 08-31-2021 ALL SYMPTOMS RESOLVED   History of kidney stones    Hx of adenomatous colonic polyps 07/2020   2 diminutive   Hyperlipidemia    Hypertension    Hypothyroidism    Internal hemorrhoids    Long QT interval 06/26/2015   Nephrolithiasis    Neuropathy 07/30/2017   TOES ON BOTH FEET   Non-obstructive CAD    a. 07/2002 Cath: LM 20, LAD 25m/d, LCX 50-1m, OM1 64m, RCA 41m, EF 60%; b. 05/2014 MV: low risk w/ small sized, mild intensity rev defect in apical/inferior/infsept area, nl EF->Med Rx.   Panic attack    NONE RECENT PER WIFE 10-09-2021   Paroxysmal atrial fibrillation (HCC)    a. Dx 04/2014; b. 05/2014 Echo: Ef 60-65%, no rwma, triv MR/TR, nl RV;  c. CHA2DS2VASc = 3-->was on eliquis , switched to xarelto  04/2015 2/2 cost; d. 05/2015 Tikosyn  loaded w/ conversion to AF; e. 06/2015 QTc prolongation and bradycardia->tikosyn  d/c'd, PPM placed, Amio started; f. 07/01/2015 In Aflutter @ clinic f/u.   Paroxysmal atrial flutter (HCC)  a. 06/2015 noted to be in rapid Aflutter in Afib clinic-->amio load continued.   Persistent atrial fibrillation (HCC) 06/21/2015   Presence of permanent cardiac pacemaker    St. Jude   Prolapsed internal hemorrhoids, grade 2-3 10/19/2016   LL Gr 3 and RP/RA Gr 2 LL banded 10/19/2016    RLL pneumonia 11/05/2012   Sleep apnea    can't tolerate mask (05/17/2017)   Tachy-brady syndrome (HCC)    a. 06/27/2015 s/p  SJM DC PPM (ser # @ [id number]).    Current Outpatient Medications  Medication Sig Dispense Refill   acetaminophen  (TYLENOL ) 500 MG tablet Take 1,000 mg by mouth every 6 (six) hours as needed for moderate pain.     diltiazem  (CARDIZEM  CD) 120 MG 24 hr capsule TAKE ONE CAPSULE BY MOUTH ONE TIME DAILY 90 capsule 3   diltiazem  (CARDIZEM ) 30 MG tablet Take 1 tablet (30 mg total) by mouth every 4 (four) hours as needed (Take as needed for for HR above 100 and SBP above 100). 30 tablet 6   famotidine  (PEPCID ) 20 MG tablet Take 20 mg by mouth daily as needed (acid reflux).     furosemide  (LASIX ) 40 MG tablet TAKE ONE TABLET BY MOUTH ONE TIME DAILY 90 tablet 3   gabapentin  (NEURONTIN ) 600 MG tablet TAKE ONE TABLET BY MOUTH THREE TIMES A DAY 270 tablet 0   levothyroxine  (SYNTHROID ) 50 MCG tablet TAKE ONE TABLET BY MOUTH ONE TIME DAILY 90 tablet 3   polyethylene glycol (MIRALAX  / GLYCOLAX ) 17 g packet Take 17 g by mouth 2 (two) times daily. (Patient taking differently: Take 17 g by mouth as needed.) 30 each 0   potassium chloride  (KLOR-CON ) 10 MEQ tablet TAKE TWO TABLETS BY MOUTH ONE TIME DAILY 180 tablet 3   predniSONE  (DELTASONE ) 10 MG tablet Take 1 tablet (10 mg total) by mouth daily. 90 tablet 3   psyllium (METAMUCIL) 58.6 % powder Take 1 packet by mouth as needed. With water or juice     rivaroxaban  (XARELTO ) 20 MG TABS tablet Take 1 tablet (20 mg total) by mouth daily with supper. 90 tablet 3   simvastatin  (ZOCOR ) 20 MG tablet Take 1 tablet (20 mg total) by mouth daily at 6 PM. 90 tablet 3   tamsulosin  (FLOMAX ) 0.4 MG CAPS capsule TAKE TWO CAPSULES BY MOUTH ONE TIME DAILY 180 capsule 3   traMADol  (ULTRAM ) 50 MG tablet Take 2 tablets (100 mg total) by mouth every 6 (six) hours as needed. (Patient taking differently: Take 100 mg by mouth as needed.) 60 tablet 5   triamcinolone  (NASACORT  ALLERGY 24HR) 55 MCG/ACT AERO nasal inhaler Place 1 spray into the nose at bedtime as needed (nasal congestion).      triamcinolone  cream (KENALOG ) 0.1 % Apply 1 Application topically daily as needed (itching, rash).     No current facility-administered medications for this encounter.    Physical Exam: BP (!) 84/80   Pulse (!) 143   Ht 5' 9 (1.753 m)   Wt 116 kg   BMI 37.78 kg/m   GEN: Well nourished, well developed in no acute distress NECK: No JVD CARDIAC: Irregularly irregular rate and rhythm, no murmurs, rubs, gallops RESPIRATORY:  Clear to auscultation without rales, wheezing or rhonchi  ABDOMEN: Soft, non-tender, non-distended EXTREMITIES:  No edema; No deformity   Wt Readings from Last 3 Encounters:  10/16/23 116 kg  08/19/23 119.7 kg  04/08/23 119.3 kg     EKG today demonstrates  Atrial flutter with 2:1  block Vent. rate 143 BPM PR interval * ms QRS duration 84 ms QT/QTcB 294/453 ms   Echo 08/21/19 demonstrated   1. Left ventricular ejection fraction, by estimation, is 60 to 65%. The  left ventricle has normal function. The left ventricle has no regional  wall motion abnormalities. There is mild asymmetric left ventricular  hypertrophy. Left ventricular diastolic parameters were normal. The average left ventricular global longitudinal strain is -19.0 %.   2. Right ventricular systolic function is normal. The right ventricular  size is normal.   3. The mitral valve is normal in structure. Trivial mitral valve  regurgitation. No evidence of mitral stenosis.   4. The aortic valve is grossly normal. Aortic valve regurgitation is not  visualized. No aortic stenosis is present.    CHA2DS2-VASc Score = 3  The patient's score is based upon: CHF History: 0 HTN History: 1 Diabetes History: 0 Stroke History: 0 Vascular Disease History: 0 Age Score: 2 Gender Score: 0       ASSESSMENT AND PLAN: Persistent Atrial Fibrillation/atrial flutter (ICD10:  I48.19) The patient's CHA2DS2-VASc score is 3, indicating a 3.2% annual risk of stroke.   Previously failed to maintain SR on  dofetilide  S/p afib ablation 2019 Patient in rapid atrial flutter starting 8/15. We discussed rhythm control options. Will plan for DCCV. Check bmet/cbc. Continue diltiazem  120 mg daily with 30 mg PRN q 4 hours for heart racing Continue Xarelto  20 mg daily ED precautions reviewed.  Secondary Hypercoagulable State (ICD10:  D68.69) The patient is at significant risk for stroke/thromboembolism based upon his CHA2DS2-VASc Score of 3.  Continue Rivaroxaban  (Xarelto ). No bleeding issues.   HTN Low today, better at home. Will hold PRN diltiazem  since it was not helping with HR.  Tachybradycardia syndrome S/p PPM, followed by Dr Inocencio   Follow up in the AF clinic post DCCV.    Informed Consent   Shared Decision Making/Informed Consent The risks (stroke, cardiac arrhythmias rarely resulting in the need for a temporary or permanent pacemaker, skin irritation or burns and complications associated with conscious sedation including aspiration, arrhythmia, respiratory failure and death), benefits (restoration of normal sinus rhythm) and alternatives of a direct current cardioversion were explained in detail to Mr. Vinita and he agrees to proceed.         [facility] [address] [place], [place] [phone]

## 2023-10-16 NOTE — Patient Instructions (Signed)
 Cardioversion scheduled for: Thursday, August 21st   - Arrive at the [facility] ([address])  and check in with ADMITTING at  11:00 AM    - Do not eat or drink anything after midnight the night prior to your procedure.   - Take all your morning medication (except diabetic medications) with a sip of water prior to arrival.  - Do NOT miss any doses of your blood thinner - if you should miss a dose or take a dose more than 4 hours late -- please notify our office immediately.  - You will not be able to drive home after your procedure. Please ensure you have a responsible adult to drive you home. You will need someone with you for 24 hours post procedure.     - Expect to be in the procedural area approximately 2 hours.   - If you feel as if you go back into normal rhythm prior to scheduled cardioversion, please notify our office immediately.   If your procedure is canceled in the cardioversion suite you will be charged a cancellation fee.

## 2023-10-16 NOTE — Progress Notes (Signed)
 Pt called for pre procedure instructions.  Voicemail left with instructions and call back # Arrival time 0930 NPO after midnight explained Instructed to take am meds with sip of water and confirmed blood thinner consistency Instructed pt need for ride home tomorrow and have responsible adult with them for 24 hrs post procedure.

## 2023-10-17 ENCOUNTER — Encounter (HOSPITAL_COMMUNITY)
Admission: RE | Disposition: A | Discharge status: Home / Self Care | Payer: Self-pay | Source: Home / Self Care | Attending: Cardiology

## 2023-10-17 ENCOUNTER — Ambulatory Visit (HOSPITAL_COMMUNITY): Payer: Self-pay | Admitting: Physician Assistant

## 2023-10-17 ENCOUNTER — Encounter (HOSPITAL_COMMUNITY): Payer: Self-pay | Admitting: Cardiology

## 2023-10-17 ENCOUNTER — Ambulatory Visit (HOSPITAL_COMMUNITY): Admitting: Anesthesiology

## 2023-10-17 ENCOUNTER — Ambulatory Visit (HOSPITAL_COMMUNITY)
Admission: RE | Admit: 2023-10-17 | Discharge: 2023-10-17 | Disposition: A | Discharge status: Home / Self Care | Attending: Cardiology | Admitting: Cardiology

## 2023-10-17 ENCOUNTER — Ambulatory Visit (HOSPITAL_BASED_OUTPATIENT_CLINIC_OR_DEPARTMENT_OTHER): Admitting: Anesthesiology

## 2023-10-17 ENCOUNTER — Other Ambulatory Visit: Payer: Self-pay

## 2023-10-17 DIAGNOSIS — N183 Chronic kidney disease, stage 3 unspecified: Secondary | ICD-10-CM

## 2023-10-17 DIAGNOSIS — I4892 Unspecified atrial flutter: Secondary | ICD-10-CM | POA: Insufficient documentation

## 2023-10-17 DIAGNOSIS — Z7901 Long term (current) use of anticoagulants: Secondary | ICD-10-CM | POA: Insufficient documentation

## 2023-10-17 DIAGNOSIS — Z79899 Other long term (current) drug therapy: Secondary | ICD-10-CM | POA: Insufficient documentation

## 2023-10-17 DIAGNOSIS — E039 Hypothyroidism, unspecified: Secondary | ICD-10-CM | POA: Insufficient documentation

## 2023-10-17 DIAGNOSIS — I129 Hypertensive chronic kidney disease with stage 1 through stage 4 chronic kidney disease, or unspecified chronic kidney disease: Secondary | ICD-10-CM | POA: Insufficient documentation

## 2023-10-17 DIAGNOSIS — I2510 Atherosclerotic heart disease of native coronary artery without angina pectoris: Secondary | ICD-10-CM | POA: Insufficient documentation

## 2023-10-17 DIAGNOSIS — I4891 Unspecified atrial fibrillation: Secondary | ICD-10-CM | POA: Diagnosis not present

## 2023-10-17 DIAGNOSIS — I495 Sick sinus syndrome: Secondary | ICD-10-CM | POA: Insufficient documentation

## 2023-10-17 DIAGNOSIS — E785 Hyperlipidemia, unspecified: Secondary | ICD-10-CM | POA: Insufficient documentation

## 2023-10-17 DIAGNOSIS — D6869 Other thrombophilia: Secondary | ICD-10-CM | POA: Diagnosis not present

## 2023-10-17 DIAGNOSIS — Z79890 Hormone replacement therapy (postmenopausal): Secondary | ICD-10-CM | POA: Insufficient documentation

## 2023-10-17 DIAGNOSIS — I4819 Other persistent atrial fibrillation: Secondary | ICD-10-CM | POA: Insufficient documentation

## 2023-10-17 DIAGNOSIS — Z950 Presence of cardiac pacemaker: Secondary | ICD-10-CM | POA: Diagnosis not present

## 2023-10-17 HISTORY — PX: CARDIOVERSION: EP1203

## 2023-10-17 SURGERY — CARDIOVERSION (CATH LAB)
Anesthesia: General

## 2023-10-17 MED ORDER — PHENYLEPHRINE HCL (PRESSORS) 10 MG/ML IV SOLN
INTRAVENOUS | Status: DC | PRN
  Administered 2023-10-17: 80 ug via INTRAVENOUS

## 2023-10-17 MED ORDER — PROPOFOL 10 MG/ML IV BOLUS
INTRAVENOUS | Status: DC | PRN
  Administered 2023-10-17: 70 mg via INTRAVENOUS

## 2023-10-17 MED ORDER — LIDOCAINE 2% (20 MG/ML) 5 ML SYRINGE
INTRAMUSCULAR | Status: DC | PRN
Start: 2023-10-17 — End: 2023-10-17
  Administered 2023-10-17: 100 mg via INTRAVENOUS

## 2023-10-17 SURGICAL SUPPLY — 1 items: PAD DEFIB RADIO PHYSIO CONN (PAD) ×1 IMPLANT

## 2023-10-17 NOTE — Anesthesia Preprocedure Evaluation (Signed)
 Anesthesia Evaluation  Patient identified by MRN, date of birth, ID band Patient awake    Reviewed: Allergy & Precautions, NPO status , Patient's Chart, lab work & pertinent test results  History of Anesthesia Complications Negative for: history of anesthetic complications  Airway Mallampati: I  TM Distance: >3 FB Neck ROM: Full    Dental  (+) Upper Dentures, Lower Dentures   Pulmonary sleep apnea , neg COPD, Patient abstained from smoking.Not current smoker, former smoker   Pulmonary exam normal        Cardiovascular Exercise Tolerance: Good METShypertension, Pt. on medications (-) angina + CAD and + Peripheral Vascular Disease  (-) Past MI + dysrhythmias (Tachy-brady syndrome ) Atrial Fibrillation + pacemaker  Rhythm:Irregular Rate:Tachycardia   '21 TTE - EF 60 to 65%. There is mild asymmetric left ventricular hypertrophy. Trivial  MR    Neuro/Psych  PSYCHIATRIC DISORDERS Anxiety     negative neurological ROS     GI/Hepatic Neg liver ROS,GERD  Controlled and Medicated,,  Endo/Other  neg diabetesHypothyroidism   Obesity   Renal/GU CRFRenal disease     Musculoskeletal  (+) Arthritis ,   Gout    Abdominal  (+) + obese  Peds  Hematology  On xarelto     Anesthesia Other Findings Past Medical History: No date: Anxiety No date: Arthritis 06/26/2015: Atypical chest pain No date: BPH with urinary obstruction No date: Cancer (HCC)BASAL CELL SKIN CANCER No date: Carotid disease, bilateral (HCC)     Comment:  a. 09/2014 Carotid U/S: 1-39% bilat ICA stenosis. No date: Chronic lower back pain No date: CKD (chronic kidney disease), stage III (HCC)     Comment:  pt. vernetta denies 12/2016: Diverticulitis No date: Diverticulosis No date: Dyspnea     Comment:  WITH EXERTION No date: GERD (gastroesophageal reflux disease) No date: Gout attack     Comment:  LAST FLARE 08-31-2021 ALL SYMPTOMS RESOLVED No date: History of  kidney stones 07/2020: Hx of adenomatous colonic polyps     Comment:  2 diminutive No date: Hyperlipidemia No date: Hypertension No date: Hypothyroidism No date: Internal hemorrhoids 06/26/2015: Long QT interval No date: Nephrolithiasis 07/30/2017: Neuropathy     Comment:  TOES ON BOTH FEET No date: Non-obstructive CAD     Comment:  a. 07/2002 Cath: LM 20, LAD 1m/d, LCX 50-72m, OM1 73m,               RCA 12m, EF 60%; b. 05/2014 MV: low risk w/ small sized,               mild intensity rev defect in apical/inferior/infsept               area, nl EF->Med Rx. No date: Panic attack     Comment:  NONE RECENT PER WIFE 10-09-2021 No date: Paroxysmal atrial fibrillation (HCC)     Comment:  a. Dx 04/2014; b. 05/2014 Echo: Ef 60-65%, no rwma, triv               MR/TR, nl RV;  c. CHA2DS2VASc = 3-->was on eliquis ,               switched to xarelto  04/2015 2/2 cost; d. 05/2015 Tikosyn                loaded w/ conversion to AF; e. 06/2015 QTc prolongation               and bradycardia->tikosyn  d/c'd, PPM placed, Amio started;  f. 07/01/2015 In Aflutter @ clinic f/u. No date: Paroxysmal atrial flutter (HCC)     Comment:  a. 06/2015 noted to be in rapid Aflutter in Afib               clinic-->amio load continued. 06/21/2015: Persistent atrial fibrillation (HCC) No date: Presence of permanent cardiac pacemaker     Comment:  St. Jude 10/19/2016: Prolapsed internal hemorrhoids, grade 2-3     Comment:  LL Gr 3 and RP/RA Gr 2 LL banded 10/19/2016  11/05/2012: RLL pneumonia No date: Sleep apnea     Comment:  can't tolerate mask (05/17/2017) No date: Tachy-brady syndrome (HCC)     Comment:  a. 06/27/2015 s/p SJM DC PPM (ser # @ [id number]).  Reproductive/Obstetrics                              Anesthesia Physical Anesthesia Plan  ASA: 3  Anesthesia Plan: General   Post-op Pain Management: Tylenol  PO (pre-op)*   Induction: Intravenous  PONV Risk Score and Plan: 2 and  Treatment may vary due to age or medical condition, Ondansetron  and Dexamethasone   Airway Management Planned: Natural Airway and Nasal Cannula  Additional Equipment: None  Intra-op Plan:   Post-operative Plan: Extubation in OR  Informed Consent: I have reviewed the patients History and Physical, chart, labs and discussed the procedure including the risks, benefits and alternatives for the proposed anesthesia with the patient or authorized representative who has indicated his/her understanding and acceptance.     Dental advisory given  Plan Discussed with: CRNA and Anesthesiologist  Anesthesia Plan Comments: (Discussed risks of anesthesia with patient, including possibility of difficulty with spontaneous ventilation under anesthesia necessitating airway intervention, PONV, and rare risks such as cardiac or respiratory or neurological events, and allergic reactions. Discussed the role of CRNA in patient's perioperative care. Patient understands.)         Anesthesia Quick Evaluation

## 2023-10-17 NOTE — Discharge Instructions (Signed)

## 2023-10-17 NOTE — Interval H&P Note (Signed)
 History and Physical Interval Note:  10/17/2023 10:19 AM  Erik [name]  has presented today for surgery, with the diagnosis of AFIB.  The various methods of treatment have been discussed with the patient and family. After consideration of risks, benefits and other options for treatment, the patient has consented to  Procedure(s): CARDIOVERSION (N/A) as a surgical intervention.  The patient's history has been reviewed, patient examined, no change in status, stable for surgery.  I have reviewed the patient's chart and labs.  Questions were answered to the patient's satisfaction.     Iviona Hole

## 2023-10-17 NOTE — Progress Notes (Signed)
 Pt called on multiple numbers to come in early for procedure.  No answer on multiple numbers.  Voicemail message left. No answer yesterday for pre procedure instructions as well.

## 2023-10-17 NOTE — Transfer of Care (Signed)
 Immediate Anesthesia Transfer of Care Note  Patient: Erik [name]  Procedure(s) Performed: CARDIOVERSION  Patient Location: PACU and Cath Lab  Anesthesia Type:General  Level of Consciousness: awake, alert , and oriented  Airway & Oxygen Therapy: Patient Spontanous Breathing and Patient connected to nasal cannula oxygen  Post-op Assessment: Report given to RN and Post -op Vital signs reviewed and stable  Post vital signs: Reviewed and stable  Last Vitals:  Vitals Value Taken Time  BP 82/62 10/17/23 10:26  Temp 36.3 C 10/17/23 10:26  Pulse 63 10/17/23 10:26  Resp 17 10/17/23 10:26  SpO2 95 % 10/17/23 10:26  Vitals shown include unfiled device data.  Last Pain:  Vitals:   10/17/23 1026  TempSrc: Tympanic  PainSc: Asleep         Complications: No notable events documented.

## 2023-10-17 NOTE — Anesthesia Procedure Notes (Signed)
 Procedure Name: General with mask airway Date/Time: 10/17/2023 10:20 AM  Performed by: Vertie Arthea RAMAN, CRNAPre-anesthesia Checklist: Patient identified, Emergency Drugs available, Suction available, Patient being monitored and Timeout performed Patient Re-evaluated:Patient Re-evaluated prior to induction Oxygen Delivery Method: Ambu bag Preoxygenation: Pre-oxygenation with 100% oxygen Induction Type: IV induction

## 2023-10-17 NOTE — CV Procedure (Addendum)
    DIRECT CURRENT CARDIOVERSION  NAME:  Erik [name]   MRN: [id number] DOB:  March 07, 1945   ADMIT DATE: 10/17/2023   INDICATIONS: Atrial flutter   PROCEDURE:   Informed consent was obtained prior to the procedure. The risks, benefits and alternatives for the procedure were discussed and the patient comprehended these risks. A timeout was taken. With the help of the device rep we first attempted to pace him out of AFL without success. The patient had the defibrillator pads placed in the anterior and posterior position. The patient then underwent sedation by the anesthesia service. Once an appropriate level of sedation was achieved, the patient received a single biphasic, synchronized 200J shock with prompt conversion to sinus rhythm. No apparent complications.  Toribio Fuel, MD  10:25 AM

## 2023-10-17 NOTE — Anesthesia Postprocedure Evaluation (Signed)
 Anesthesia Post Note  Patient: Erik [name]  Procedure(s) Performed: CARDIOVERSION     Patient location during evaluation: Cath Lab Anesthesia Type: General Level of consciousness: awake and alert Pain management: pain level controlled Vital Signs Assessment: post-procedure vital signs reviewed and stable Respiratory status: spontaneous breathing, nonlabored ventilation, respiratory function stable and patient connected to nasal cannula oxygen Cardiovascular status: blood pressure returned to baseline and stable Postop Assessment: no apparent nausea or vomiting Anesthetic complications: no   No notable events documented.  Last Vitals:  Vitals:   10/17/23 1036 10/17/23 1046  BP: 92/68 96/63  Pulse: 65 63  Resp: 13 11  Temp:    SpO2: 94% 95%    Last Pain:  Vitals:   10/17/23 1046  TempSrc:   PainSc: 0-No pain                 [name]

## 2023-10-18 ENCOUNTER — Encounter (HOSPITAL_COMMUNITY): Payer: Self-pay | Admitting: Internal Medicine

## 2023-10-31 ENCOUNTER — Ambulatory Visit (HOSPITAL_COMMUNITY)
Admission: RE | Admit: 2023-10-31 | Discharge: 2023-10-31 | Disposition: A | Discharge status: Home / Self Care | Source: Ambulatory Visit | Attending: Physician Assistant | Admitting: Physician Assistant

## 2023-10-31 ENCOUNTER — Other Ambulatory Visit (HOSPITAL_COMMUNITY): Payer: Self-pay

## 2023-10-31 VITALS — BP 122/64 | HR 60 | Ht 70.0 in | Wt 259.6 lb

## 2023-10-31 DIAGNOSIS — R011 Cardiac murmur, unspecified: Secondary | ICD-10-CM

## 2023-10-31 DIAGNOSIS — I4819 Other persistent atrial fibrillation: Secondary | ICD-10-CM

## 2023-10-31 DIAGNOSIS — D6869 Other thrombophilia: Secondary | ICD-10-CM | POA: Diagnosis not present

## 2023-10-31 DIAGNOSIS — I484 Atypical atrial flutter: Secondary | ICD-10-CM

## 2023-10-31 NOTE — Progress Notes (Signed)
 Primary Care Physician: Johnny Garnette LABOR, MD Primary Cardiologist: Dorn Lesches, MD Electrophysiologist: Will Gladis Norton, MD  Referring Physician: Dr Norton Erik [name] is a 78 y.o. male with a history of CAD, HTN, HLD, hypothyroidism, tachybradycardia syndrome s/p PPM, atrial fibrillation who presents for follow up in the [facility]. March 2016 he was diagnosed with rapid atrial fibrillation and chest pain. He was loaded on dofetilide  but this was stopped and he went back into atrial fibrillation. 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 He is status post atrial fibrillation ablation 05/17/2017. He we presented to A-fib clinic 12/04/2021 after being found to be in atrial flutter. His rates were quite rapid. He was referred to the emergency room and got a cardioversion while in the emergency room. Patient is on Xarelto  for stroke prevention.    The device clinic received an alert for rapid atrial flutter starting 10/11/23. He is s/p DCCV on 10/17/23.  Patient returns for follow up for atrial fibrillation. He is in SR today and feeling much better. No bleeding issues on anticoagulation.   Today, he  denies symptoms of palpitations, chest pain, shortness of breath, orthopnea, PND, lower extremity edema, dizziness, presyncope, syncope, snoring, daytime somnolence, bleeding, or neurologic sequela. The patient is tolerating medications without difficulties and is otherwise without complaint today.    Atrial Fibrillation Risk Factors:  he does not have symptoms or diagnosis of sleep apnea. he does not have a history of rheumatic fever.   Atrial Fibrillation Management history:  Previous antiarrhythmic drugs: dofetilide   Previous cardioversions: 11/2021 Previous ablations: 05/17/17 Anticoagulation history: Xarelto    ROS- All systems are reviewed and negative except as per the HPI above.  Past  Medical History:  Diagnosis Date   Anxiety    Arthritis    Atypical chest pain 06/26/2015   BPH with urinary obstruction    Cancer (HCC)BASAL CELL SKIN CANCER    Carotid disease, bilateral (HCC)    a. 09/2014 Carotid U/S: 1-39% bilat ICA stenosis.   Chronic lower back pain    CKD (chronic kidney disease), stage III (HCC)    pt. vernetta denies   Diverticulitis 12/2016   Diverticulosis    Dyspnea    WITH EXERTION   GERD (gastroesophageal reflux disease)    Gout attack    LAST FLARE 08-31-2021 ALL SYMPTOMS RESOLVED   History of kidney stones    Hx of adenomatous colonic polyps 07/2020   2 diminutive   Hyperlipidemia    Hypertension    Hypothyroidism    Internal hemorrhoids    Long QT interval 06/26/2015   Nephrolithiasis    Neuropathy 07/30/2017   TOES ON BOTH FEET   Non-obstructive CAD    a. 07/2002 Cath: LM 20, LAD 70m/d, LCX 50-31m, OM1 80m, RCA 94m, EF 60%; b. 05/2014 MV: low risk w/ small sized, mild intensity rev defect in apical/inferior/infsept area, nl EF->Med Rx.   Panic attack    NONE RECENT PER WIFE 10-09-2021   Paroxysmal atrial fibrillation (HCC)    a. Dx 04/2014; b. 05/2014 Echo: Ef 60-65%, no rwma, triv MR/TR, nl RV;  c. CHA2DS2VASc = 3-->was on eliquis , switched to xarelto  04/2015 2/2 cost; d. 05/2015 Tikosyn  loaded w/ conversion to AF; e. 06/2015 QTc prolongation and bradycardia->tikosyn  d/c'd, PPM placed, Amio started; f. 07/01/2015 In Aflutter @ clinic f/u.   Paroxysmal atrial flutter (HCC)    a. 06/2015 noted to be in rapid Aflutter  in Afib clinic-->amio load continued.   Persistent atrial fibrillation (HCC) 06/21/2015   Presence of permanent cardiac pacemaker    St. Jude   Prolapsed internal hemorrhoids, grade 2-3 10/19/2016   LL Gr 3 and RP/RA Gr 2 LL banded 10/19/2016    RLL pneumonia 11/05/2012   Sleep apnea    can't tolerate mask (05/17/2017)   Tachy-brady syndrome (HCC)    a. 06/27/2015 s/p SJM DC PPM (ser # @ [id number]).    Current Outpatient Medications   Medication Sig Dispense Refill   acetaminophen  (TYLENOL ) 500 MG tablet Take 1,000 mg by mouth every 6 (six) hours as needed for moderate pain. (Patient taking differently: Take 1,000 mg by mouth as needed for moderate pain (pain score 4-6).)     diltiazem  (CARDIZEM  CD) 120 MG 24 hr capsule TAKE ONE CAPSULE BY MOUTH ONE TIME DAILY 90 capsule 3   diltiazem  (CARDIZEM ) 30 MG tablet Take 1 tablet (30 mg total) by mouth every 4 (four) hours as needed (Take as needed for for HR above 100 and SBP above 100). 30 tablet 6   famotidine  (PEPCID ) 20 MG tablet Take 20 mg by mouth daily as needed (acid reflux). (Patient taking differently: Take 20 mg by mouth as needed (acid reflux).)     furosemide  (LASIX ) 40 MG tablet TAKE ONE TABLET BY MOUTH ONE TIME DAILY 90 tablet 3   gabapentin  (NEURONTIN ) 600 MG tablet TAKE ONE TABLET BY MOUTH THREE TIMES A DAY 270 tablet 0   levothyroxine  (SYNTHROID ) 50 MCG tablet TAKE ONE TABLET BY MOUTH ONE TIME DAILY 90 tablet 3   polyethylene glycol (MIRALAX  / GLYCOLAX ) 17 g packet Take 17 g by mouth 2 (two) times daily. (Patient taking differently: Take 17 g by mouth as needed.) 30 each 0   potassium chloride  (KLOR-CON ) 10 MEQ tablet TAKE TWO TABLETS BY MOUTH ONE TIME DAILY 180 tablet 3   predniSONE  (DELTASONE ) 10 MG tablet Take 1 tablet (10 mg total) by mouth daily. 90 tablet 3   psyllium (METAMUCIL) 58.6 % powder Take 1 packet by mouth as needed. With water or juice     rivaroxaban  (XARELTO ) 20 MG TABS tablet Take 1 tablet (20 mg total) by mouth daily with supper. 90 tablet 3   simvastatin  (ZOCOR ) 20 MG tablet Take 1 tablet (20 mg total) by mouth daily at 6 PM. 90 tablet 3   tamsulosin  (FLOMAX ) 0.4 MG CAPS capsule TAKE TWO CAPSULES BY MOUTH ONE TIME DAILY 180 capsule 3   traMADol  (ULTRAM ) 50 MG tablet Take 2 tablets (100 mg total) by mouth every 6 (six) hours as needed. 60 tablet 5   triamcinolone  (NASACORT  ALLERGY 24HR) 55 MCG/ACT AERO nasal inhaler Place 1 spray into the nose at  bedtime as needed (nasal congestion).     triamcinolone  cream (KENALOG ) 0.1 % Apply 1 Application topically daily as needed (itching, rash).     No current facility-administered medications for this encounter.    Physical Exam: BP 122/64   Pulse 60   Ht 5' 10 (1.778 m)   Wt 117.8 kg   BMI 37.25 kg/m   GEN: Well nourished, well developed in no acute distress CARDIAC: Regular rate and rhythm, no rubs, gallops, 2/6 systolic murmur RESPIRATORY:  Clear to auscultation without rales, wheezing or rhonchi  ABDOMEN: Soft, non-tender, non-distended EXTREMITIES:  No edema; No deformity    Wt Readings from Last 3 Encounters:  10/31/23 117.8 kg  10/17/23 113.4 kg  10/16/23 116 kg     EKG  today demonstrates  A paced rhythm Vent. rate 60 BPM PR interval 152 ms QRS duration 78 ms QT/QTcB 396/396 ms   Echo 08/21/19 demonstrated   1. Left ventricular ejection fraction, by estimation, is 60 to 65%. The  left ventricle has normal function. The left ventricle has no regional  wall motion abnormalities. There is mild asymmetric left ventricular  hypertrophy. Left ventricular diastolic parameters were normal. The average left ventricular global longitudinal strain is -19.0 %.   2. Right ventricular systolic function is normal. The right ventricular  size is normal.   3. The mitral valve is normal in structure. Trivial mitral valve  regurgitation. No evidence of mitral stenosis.   4. The aortic valve is grossly normal. Aortic valve regurgitation is not  visualized. No aortic stenosis is present.    CHA2DS2-VASc Score = 3  The patient's score is based upon: CHF History: 0 HTN History: 1 Diabetes History: 0 Stroke History: 0 Vascular Disease History: 0 Age Score: 2 Gender Score: 0       ASSESSMENT AND PLAN: Persistent Atrial Fibrillation/atrial flutter (ICD10:  I48.19) The patient's CHA2DS2-VASc score is 3, indicating a 3.2% annual risk of stroke.   Previously failed to maintain  SR on dofetilide  S/p afib ablation 2019 S/p DCCV 10/17/23 Patient appears to be maintaining SR Continue diltiazem  120 mg daily with 30 mg PRN for heart racing. Continue Xarelto  20 mg daily  Secondary Hypercoagulable State (ICD10:  D68.69) The patient is at significant risk for stroke/thromboembolism based upon his CHA2DS2-VASc Score of 3.  Continue Rivaroxaban  (Xarelto ). No bleeding issues.   HTN Stable on current regimen  Tachybradycardia syndrome S/p PPM, followed by Dr Inocencio  Murmur Systolic murmur noted today, last echo in 2021 did not show any valvular issues. Patient denies any prior h/o murmur. Will order repeat echo today.    Follow up with Dr Inocencio per recall.     [facility] [address] [place], [place] [address] [phone]

## 2023-10-31 NOTE — Patient Instructions (Signed)
 Echocardiogram -- scheduling will call once insurance authorization received.

## 2023-11-04 ENCOUNTER — Telehealth: Payer: Self-pay | Admitting: Cardiovascular Disease

## 2023-11-04 MED ORDER — DILTIAZEM HCL 30 MG PO TABS: 30.0000 mg | ORAL_TABLET | Freq: Four times a day (QID) | ORAL | Status: AC

## 2023-11-04 NOTE — Telephone Encounter (Signed)
 Spoke with the patient's wife who states that she is confused about when the patient is supposed to take diltiazem  30 mg. Prescription was written Take as needed for for HR above 100 and SBP above 100. She states that his systolic BP has been above 100 but he was been feeling tired. Advised that prescription is incorrect and it should read to take as needed for HR above 100 and SBP above 150. I have updated his medication list. Patient's spouse is aware not to give him diltiazem  today. She states that he has remained in normal rhythm since his cardioversion.

## 2023-11-04 NOTE — Telephone Encounter (Signed)
 Pt c/o medication issue:  1. Name of Medication:   diltiazem  (CARDIZEM ) 30 MG tablet   2. How are you currently taking this medication (dosage and times per day)?  As prescribed - last dose was this morning  3. Are you having a reaction (difficulty breathing--STAT)?   4. What is your medication issue?   Wife Emergency planning/management officer) wants a call back to discuss when patient should be taking this medication as patient's BP and HR reading today was 131/83  HR 84 (20 minutes ago) and 125/63  HR 67 (while on phone).   Pt c/o BP issue: STAT if pt c/o blurred vision, one-sided weakness or slurred speech.  Wife noted patient is very lethargic but has no dizziness, headache, blurred vision or passing out.

## 2023-11-29 NOTE — Progress Notes (Signed)
 Remote PPM Transmission

## 2023-12-09 ENCOUNTER — Ambulatory Visit (HOSPITAL_COMMUNITY): Payer: Self-pay | Admitting: Physician Assistant

## 2023-12-09 ENCOUNTER — Ambulatory Visit (HOSPITAL_COMMUNITY)
Admission: RE | Admit: 2023-12-09 | Discharge: 2023-12-09 | Disposition: A | Discharge status: Home / Self Care | Source: Ambulatory Visit | Attending: Physician Assistant | Admitting: Physician Assistant

## 2023-12-09 DIAGNOSIS — R011 Cardiac murmur, unspecified: Secondary | ICD-10-CM | POA: Insufficient documentation

## 2023-12-09 LAB — ECHOCARDIOGRAM COMPLETE
Area-P 1/2: 4.63 cm2
S' Lateral: 2.9 cm

## 2023-12-17 ENCOUNTER — Ambulatory Visit: Payer: Medicare HMO

## 2023-12-17 DIAGNOSIS — I495 Sick sinus syndrome: Secondary | ICD-10-CM | POA: Diagnosis not present

## 2023-12-18 LAB — CUP PACEART REMOTE DEVICE CHECK
Battery Remaining Longevity: 25 mo
Battery Remaining Percentage: 23 %
Battery Voltage: 2.92 V
Brady Statistic AP VP Percent: 1 %
Brady Statistic AP VS Percent: 74 %
Brady Statistic AS VP Percent: 1 %
Brady Statistic AS VS Percent: 26 %
Brady Statistic RA Percent Paced: 71 %
Brady Statistic RV Percent Paced: 1 %
Date Time Interrogation Session: 20251021040023
Implantable Lead Connection Status: 753985
Implantable Lead Connection Status: 753985
Implantable Lead Implant Date: 20170501
Implantable Lead Implant Date: 20170501
Implantable Lead Location: 753859
Implantable Lead Location: 753860
Implantable Pulse Generator Implant Date: 20170501
Lead Channel Impedance Value: 410 Ohm
Lead Channel Impedance Value: 560 Ohm
Lead Channel Pacing Threshold Amplitude: 0.75 V
Lead Channel Pacing Threshold Amplitude: 0.75 V
Lead Channel Pacing Threshold Pulse Width: 0.5 ms
Lead Channel Pacing Threshold Pulse Width: 0.5 ms
Lead Channel Sensing Intrinsic Amplitude: 3.9 mV
Lead Channel Sensing Intrinsic Amplitude: 9.4 mV
Lead Channel Setting Pacing Amplitude: 1.75 V
Lead Channel Setting Pacing Amplitude: 2.5 V
Lead Channel Setting Pacing Pulse Width: 0.5 ms
Lead Channel Setting Sensing Sensitivity: 2 mV
Pulse Gen Model: 2272
Pulse Gen Serial Number: 7897836

## 2023-12-20 ENCOUNTER — Ambulatory Visit: Payer: Self-pay | Admitting: Cardiology

## 2023-12-20 NOTE — Progress Notes (Signed)
 Remote PPM Transmission

## 2023-12-24 ENCOUNTER — Other Ambulatory Visit: Payer: Self-pay | Admitting: Family Medicine

## 2024-01-13 ENCOUNTER — Other Ambulatory Visit: Payer: Self-pay | Admitting: Family Medicine

## 2024-01-13 ENCOUNTER — Ambulatory Visit (INDEPENDENT_AMBULATORY_CARE_PROVIDER_SITE_OTHER): Admitting: Family Medicine

## 2024-01-13 ENCOUNTER — Encounter: Payer: Self-pay | Admitting: Family Medicine

## 2024-01-13 VITALS — BP 128/70 | HR 63 | Temp 97.7°F | Wt 262.0 lb

## 2024-01-13 DIAGNOSIS — M545 Low back pain, unspecified: Secondary | ICD-10-CM | POA: Diagnosis not present

## 2024-01-13 DIAGNOSIS — R1013 Epigastric pain: Secondary | ICD-10-CM | POA: Diagnosis not present

## 2024-01-13 DIAGNOSIS — I10 Essential (primary) hypertension: Secondary | ICD-10-CM

## 2024-01-13 MED ORDER — OMEPRAZOLE 40 MG PO CPDR: 40.0000 mg | DELAYED_RELEASE_CAPSULE | Freq: Every day | ORAL | 3 refills | Status: AC

## 2024-01-13 NOTE — Progress Notes (Signed)
   Subjective:    Patient ID: Erik [name], male    DOB: Jan 08, 1946, 78 y.o.   MRN: [id number]  HPI Here for several issues. First he has had 4 days of intermittent sharp epigastric pains. No nausea or fever. His BM's have been regular. He denies any heartburn or indigestion. He takes OTC Pepcid  sporadically. The Pepcid  has not helped these past 4 days. The othe issue is right sided low back pain. This started about 6 days ago. No recent trauma. No pain in the legs. He is S/P lumbar spine surgery. He passed a kidney stone at home a few weeks ago, and he asks if this pain could be from a stone.    Review of Systems  Constitutional: Negative.   Respiratory: Negative.    Cardiovascular: Negative.   Gastrointestinal:  Positive for abdominal pain. Negative for abdominal distention, blood in stool, constipation, diarrhea, nausea and vomiting.  Genitourinary: Negative.   Musculoskeletal:  Positive for back pain.       Objective:   Physical Exam Constitutional:      General: He is not in acute distress.    Appearance: Normal appearance.  Cardiovascular:     Rate and Rhythm: Normal rate and regular rhythm.     Pulses: Normal pulses.     Heart sounds: Normal heart sounds.  Pulmonary:     Effort: Pulmonary effort is normal.     Breath sounds: Normal breath sounds.  Abdominal:     General: Bowel sounds are normal. There is no distension.     Palpations: Abdomen is soft. There is no mass.     Tenderness: There is no right CVA tenderness, left CVA tenderness, guarding or rebound.     Hernia: No hernia is present.     Comments: He is tender in the epigastrium   Musculoskeletal:     Comments: He is tender in the right lower back just above the sacrum. ROM of the spine is limited by pain   Neurological:     Mental Status: He is alert.           Assessment & Plan:  His epigastric pain is likely from either gall bladder disease or from gastritis/duodenitis. We will set up an abdominal  US  soon. He will stop the Pepcid  and with start Omeprazole 40 mg every morning. The lower back pain is likely from degenerative discs or arthritis. I reassured him the pain is not from a kidney stone. He can take Tylenol  and apply heat as needed.  Garnette Olmsted, MD

## 2024-01-17 ENCOUNTER — Other Ambulatory Visit: Payer: Self-pay | Admitting: Family Medicine

## 2024-01-17 DIAGNOSIS — I10 Essential (primary) hypertension: Secondary | ICD-10-CM

## 2024-01-21 ENCOUNTER — Ambulatory Visit
Admission: RE | Admit: 2024-01-21 | Discharge: 2024-01-21 | Disposition: A | Source: Ambulatory Visit | Attending: Family Medicine | Admitting: Family Medicine

## 2024-01-21 DIAGNOSIS — R1013 Epigastric pain: Secondary | ICD-10-CM

## 2024-01-27 ENCOUNTER — Other Ambulatory Visit: Payer: Self-pay | Admitting: Family Medicine

## 2024-01-27 DIAGNOSIS — I10 Essential (primary) hypertension: Secondary | ICD-10-CM

## 2024-01-28 ENCOUNTER — Ambulatory Visit: Payer: Self-pay | Admitting: Family Medicine

## 2024-01-31 ENCOUNTER — Other Ambulatory Visit: Payer: Self-pay | Admitting: Family Medicine

## 2024-01-31 DIAGNOSIS — I10 Essential (primary) hypertension: Secondary | ICD-10-CM

## 2024-03-17 ENCOUNTER — Ambulatory Visit: Payer: Medicare HMO

## 2024-03-17 DIAGNOSIS — I495 Sick sinus syndrome: Secondary | ICD-10-CM

## 2024-03-18 LAB — CUP PACEART REMOTE DEVICE CHECK
Battery Remaining Longevity: 23 mo
Battery Remaining Percentage: 21 %
Battery Voltage: 2.9 V
Brady Statistic AP VP Percent: 1 %
Brady Statistic AP VS Percent: 74 %
Brady Statistic AS VP Percent: 1 %
Brady Statistic AS VS Percent: 25 %
Brady Statistic RA Percent Paced: 72 %
Brady Statistic RV Percent Paced: 1 %
Date Time Interrogation Session: 20260120075137
Implantable Lead Connection Status: 753985
Implantable Lead Connection Status: 753985
Implantable Lead Implant Date: 20170501
Implantable Lead Implant Date: 20170501
Implantable Lead Location: 753859
Implantable Lead Location: 753860
Implantable Pulse Generator Implant Date: 20170501
Lead Channel Impedance Value: 400 Ohm
Lead Channel Impedance Value: 580 Ohm
Lead Channel Pacing Threshold Amplitude: 0.75 V
Lead Channel Pacing Threshold Amplitude: 0.75 V
Lead Channel Pacing Threshold Pulse Width: 0.5 ms
Lead Channel Pacing Threshold Pulse Width: 0.5 ms
Lead Channel Sensing Intrinsic Amplitude: 3.2 mV
Lead Channel Sensing Intrinsic Amplitude: 9.4 mV
Lead Channel Setting Pacing Amplitude: 1.75 V
Lead Channel Setting Pacing Amplitude: 2.5 V
Lead Channel Setting Pacing Pulse Width: 0.5 ms
Lead Channel Setting Sensing Sensitivity: 2 mV
Pulse Gen Model: 2272
Pulse Gen Serial Number: 7897836

## 2024-03-19 ENCOUNTER — Ambulatory Visit: Payer: Self-pay | Admitting: Cardiology

## 2024-03-20 NOTE — Progress Notes (Signed)
 Remote PPM Transmission

## 2024-04-02 ENCOUNTER — Other Ambulatory Visit: Payer: Self-pay | Admitting: Family Medicine

## 2024-04-02 MED ORDER — GABAPENTIN 600 MG PO TABS: 600.0000 mg | ORAL_TABLET | Freq: Three times a day (TID) | ORAL | 0 refills | Status: AC

## 2024-04-02 NOTE — Addendum Note (Signed)
 Addended by: CHRISTYNE IDELL DRAGON A on: 04/02/2024 11:29 AM   Modules accepted: Orders

## 2024-06-08 ENCOUNTER — Ambulatory Visit: Admitting: Cardiology

## 2024-06-16 ENCOUNTER — Ambulatory Visit

## 2024-09-15 ENCOUNTER — Ambulatory Visit

## 2024-12-15 ENCOUNTER — Ambulatory Visit

## 2025-03-16 ENCOUNTER — Ambulatory Visit

## 2025-06-15 ENCOUNTER — Ambulatory Visit
